# Patient Record
Sex: Female | Born: 1960 | Race: Black or African American | Hispanic: No | Smoking: Former smoker
Health system: Southern US, Community
[De-identification: ages and names within clinical notes are randomized; demographics above are authoritative.]

## PROBLEM LIST (undated history)

## (undated) DIAGNOSIS — Z972 Presence of dental prosthetic device (complete) (partial): Secondary | ICD-10-CM

## (undated) DIAGNOSIS — M1990 Unspecified osteoarthritis, unspecified site: Secondary | ICD-10-CM

## (undated) DIAGNOSIS — E119 Type 2 diabetes mellitus without complications: Secondary | ICD-10-CM

## (undated) DIAGNOSIS — J309 Allergic rhinitis, unspecified: Secondary | ICD-10-CM

## (undated) DIAGNOSIS — Z973 Presence of spectacles and contact lenses: Secondary | ICD-10-CM

## (undated) DIAGNOSIS — K219 Gastro-esophageal reflux disease without esophagitis: Secondary | ICD-10-CM

## (undated) DIAGNOSIS — E669 Obesity, unspecified: Secondary | ICD-10-CM

## (undated) DIAGNOSIS — Z889 Allergy status to unspecified drugs, medicaments and biological substances status: Secondary | ICD-10-CM

## (undated) DIAGNOSIS — M2550 Pain in unspecified joint: Secondary | ICD-10-CM

## (undated) DIAGNOSIS — N951 Menopausal and female climacteric states: Secondary | ICD-10-CM

## (undated) DIAGNOSIS — I10 Essential (primary) hypertension: Secondary | ICD-10-CM

## (undated) HISTORY — DX: Presence of dental prosthetic device (complete) (partial): Z97.2

## (undated) HISTORY — DX: Menopausal and female climacteric states: N95.1

## (undated) HISTORY — DX: Presence of spectacles and contact lenses: Z97.3

## (undated) HISTORY — DX: Essential (primary) hypertension: I10

## (undated) HISTORY — DX: Obesity, unspecified: E66.9

## (undated) HISTORY — DX: Allergy status to unspecified drugs, medicaments and biological substances: Z88.9

## (undated) HISTORY — DX: Allergic rhinitis, unspecified: J30.9

## (undated) HISTORY — DX: Pain in unspecified joint: M25.50

## (undated) HISTORY — DX: Gastro-esophageal reflux disease without esophagitis: K21.9

## (undated) HISTORY — PX: ROTATOR CUFF REPAIR: SHX139

## (undated) HISTORY — PX: OTHER SURGICAL HISTORY: SHX169

## (undated) HISTORY — PX: WISDOM TOOTH EXTRACTION: SHX21

---

## 1988-12-11 HISTORY — PX: OTHER SURGICAL HISTORY: SHX169

## 1989-02-08 HISTORY — PX: TUBAL LIGATION: SHX77

## 1990-09-10 HISTORY — PX: ROTATOR CUFF REPAIR: SHX139

## 1998-05-19 ENCOUNTER — Emergency Department (HOSPITAL_COMMUNITY): Admission: EM | Admit: 1998-05-19 | Discharge: 1998-05-19 | Payer: Self-pay | Admitting: Emergency Medicine

## 1998-07-30 ENCOUNTER — Encounter: Admission: RE | Admit: 1998-07-30 | Discharge: 1998-10-28 | Payer: Self-pay | Admitting: Anesthesiology

## 1998-09-03 ENCOUNTER — Ambulatory Visit (HOSPITAL_COMMUNITY): Admission: RE | Admit: 1998-09-03 | Discharge: 1998-09-03 | Payer: Self-pay | Admitting: Specialist

## 1999-02-28 ENCOUNTER — Encounter: Payer: Self-pay | Admitting: Neurosurgery

## 1999-02-28 ENCOUNTER — Ambulatory Visit (HOSPITAL_COMMUNITY): Admission: RE | Admit: 1999-02-28 | Discharge: 1999-02-28 | Payer: Self-pay | Admitting: Neurosurgery

## 1999-03-17 ENCOUNTER — Encounter: Admission: RE | Admit: 1999-03-17 | Discharge: 1999-06-15 | Payer: Self-pay | Admitting: Neurosurgery

## 2000-11-24 ENCOUNTER — Emergency Department (HOSPITAL_COMMUNITY): Admission: EM | Admit: 2000-11-24 | Discharge: 2000-11-24 | Payer: Self-pay | Admitting: Emergency Medicine

## 2001-03-12 ENCOUNTER — Encounter: Payer: Self-pay | Admitting: *Deleted

## 2001-03-12 ENCOUNTER — Encounter: Admission: RE | Admit: 2001-03-12 | Discharge: 2001-03-12 | Payer: Self-pay | Admitting: *Deleted

## 2002-04-21 ENCOUNTER — Other Ambulatory Visit: Admission: RE | Admit: 2002-04-21 | Discharge: 2002-04-21 | Payer: Self-pay | Admitting: Family Medicine

## 2002-08-08 ENCOUNTER — Other Ambulatory Visit: Admission: RE | Admit: 2002-08-08 | Discharge: 2002-08-08 | Payer: Self-pay | Admitting: *Deleted

## 2003-06-19 ENCOUNTER — Encounter: Admission: RE | Admit: 2003-06-19 | Discharge: 2003-06-19 | Payer: Self-pay | Admitting: Family Medicine

## 2003-06-19 ENCOUNTER — Encounter: Payer: Self-pay | Admitting: Family Medicine

## 2003-06-23 ENCOUNTER — Encounter: Admission: RE | Admit: 2003-06-23 | Discharge: 2003-09-21 | Payer: Self-pay | Admitting: Family Medicine

## 2003-08-06 ENCOUNTER — Encounter (INDEPENDENT_AMBULATORY_CARE_PROVIDER_SITE_OTHER): Payer: Self-pay | Admitting: *Deleted

## 2003-08-06 ENCOUNTER — Ambulatory Visit (HOSPITAL_BASED_OUTPATIENT_CLINIC_OR_DEPARTMENT_OTHER): Admission: RE | Admit: 2003-08-06 | Discharge: 2003-08-06 | Payer: Self-pay | Admitting: General Surgery

## 2003-10-22 ENCOUNTER — Other Ambulatory Visit: Admission: RE | Admit: 2003-10-22 | Discharge: 2003-10-22 | Payer: Self-pay | Admitting: *Deleted

## 2004-04-14 ENCOUNTER — Inpatient Hospital Stay (HOSPITAL_COMMUNITY): Admission: RE | Admit: 2004-04-14 | Discharge: 2004-04-15 | Payer: Self-pay | Admitting: Specialist

## 2004-05-06 ENCOUNTER — Ambulatory Visit (HOSPITAL_COMMUNITY): Admission: RE | Admit: 2004-05-06 | Discharge: 2004-05-06 | Payer: Self-pay | Admitting: Gastroenterology

## 2006-04-11 ENCOUNTER — Encounter: Payer: Self-pay | Admitting: Specialist

## 2006-12-11 HISTORY — PX: COLONOSCOPY: SHX174

## 2008-01-03 ENCOUNTER — Ambulatory Visit: Payer: Self-pay | Admitting: Family Medicine

## 2008-02-14 ENCOUNTER — Ambulatory Visit: Payer: Self-pay | Admitting: *Deleted

## 2008-03-27 ENCOUNTER — Encounter (INDEPENDENT_AMBULATORY_CARE_PROVIDER_SITE_OTHER): Payer: Self-pay | Admitting: Nurse Practitioner

## 2008-03-27 ENCOUNTER — Ambulatory Visit: Payer: Self-pay | Admitting: Internal Medicine

## 2008-03-27 LAB — CONVERTED CEMR LAB
AST: 18 units/L (ref 0–37)
Alkaline Phosphatase: 53 units/L (ref 39–117)
BUN: 9 mg/dL (ref 6–23)
Basophils Absolute: 0 10*3/uL (ref 0.0–0.1)
Basophils Relative: 0 % (ref 0–1)
Creatinine, Ser: 0.66 mg/dL (ref 0.40–1.20)
Eosinophils Absolute: 0.2 10*3/uL (ref 0.0–0.7)
Hemoglobin: 11.2 g/dL — ABNORMAL LOW (ref 12.0–15.0)
MCHC: 31.2 g/dL (ref 30.0–36.0)
MCV: 77 fL — ABNORMAL LOW (ref 78.0–100.0)
Monocytes Absolute: 0.4 10*3/uL (ref 0.1–1.0)
Monocytes Relative: 6 % (ref 3–12)
Neutrophils Relative %: 54 % (ref 43–77)
RBC: 4.66 M/uL (ref 3.87–5.11)
RDW: 20.9 % — ABNORMAL HIGH (ref 11.5–15.5)
Total Bilirubin: 0.4 mg/dL (ref 0.3–1.2)

## 2008-04-03 ENCOUNTER — Ambulatory Visit (HOSPITAL_COMMUNITY): Admission: RE | Admit: 2008-04-03 | Discharge: 2008-04-03 | Payer: Self-pay | Admitting: Family Medicine

## 2008-06-08 ENCOUNTER — Encounter (INDEPENDENT_AMBULATORY_CARE_PROVIDER_SITE_OTHER): Payer: Self-pay | Admitting: Nurse Practitioner

## 2008-06-08 ENCOUNTER — Ambulatory Visit: Payer: Self-pay | Admitting: Internal Medicine

## 2008-06-08 LAB — CONVERTED CEMR LAB
HDL: 79 mg/dL (ref >39)
LDL Cholesterol: 95 mg/dL (ref 0–99)
Total CHOL/HDL Ratio: 2.4
Triglycerides: 61 mg/dL (ref <150)
VLDL: 12 mg/dL (ref 0–40)

## 2008-08-21 ENCOUNTER — Ambulatory Visit: Payer: Self-pay | Admitting: Internal Medicine

## 2008-08-21 ENCOUNTER — Encounter (INDEPENDENT_AMBULATORY_CARE_PROVIDER_SITE_OTHER): Payer: Self-pay | Admitting: Family Medicine

## 2008-08-21 LAB — CONVERTED CEMR LAB
Basophils Absolute: 0 10*3/uL (ref 0.0–0.1)
Basophils Relative: 0 % (ref 0–1)
Hemoglobin: 11.8 g/dL — ABNORMAL LOW (ref 12.0–15.0)
MCHC: 30.3 g/dL (ref 30.0–36.0)
Monocytes Absolute: 0.5 10*3/uL (ref 0.1–1.0)
Neutro Abs: 4.1 10*3/uL (ref 1.7–7.7)
Neutrophils Relative %: 58 % (ref 43–77)
Platelets: 296 10*3/uL (ref 150–400)
RDW: 18.5 % — ABNORMAL HIGH (ref 11.5–15.5)
TSH: 1.016 microintl units/mL (ref 0.350–4.50)

## 2008-10-30 ENCOUNTER — Ambulatory Visit: Payer: Self-pay | Admitting: Family Medicine

## 2009-02-19 ENCOUNTER — Ambulatory Visit: Payer: Self-pay | Admitting: Family Medicine

## 2009-04-23 ENCOUNTER — Ambulatory Visit (HOSPITAL_COMMUNITY): Admission: RE | Admit: 2009-04-23 | Discharge: 2009-04-23 | Payer: Self-pay | Admitting: Internal Medicine

## 2009-07-09 ENCOUNTER — Ambulatory Visit: Payer: Self-pay | Admitting: Internal Medicine

## 2009-08-06 ENCOUNTER — Ambulatory Visit: Payer: Self-pay | Admitting: Family Medicine

## 2009-08-06 LAB — CONVERTED CEMR LAB
BUN: 9 mg/dL (ref 6–23)
Basophils Absolute: 0 10*3/uL (ref 0.0–0.1)
Creatinine, Ser: 0.68 mg/dL (ref 0.40–1.20)
Eosinophils Absolute: 0.2 10*3/uL (ref 0.0–0.7)
Eosinophils Relative: 4 % (ref 0–5)
HCT: 33.1 % — ABNORMAL LOW (ref 36.0–46.0)
Lymphocytes Relative: 32 % (ref 12–46)
Platelets: 298 10*3/uL (ref 150–400)
Potassium: 4 meq/L (ref 3.5–5.3)
RDW: 22 % — ABNORMAL HIGH (ref 11.5–15.5)
Vit D, 25-Hydroxy: 13 ng/mL — ABNORMAL LOW (ref 30–89)

## 2009-08-10 ENCOUNTER — Ambulatory Visit (HOSPITAL_COMMUNITY): Admission: RE | Admit: 2009-08-10 | Discharge: 2009-08-10 | Payer: Self-pay | Admitting: Family Medicine

## 2009-11-11 ENCOUNTER — Other Ambulatory Visit: Admission: RE | Admit: 2009-11-11 | Discharge: 2009-11-11 | Payer: Self-pay | Admitting: Obstetrics and Gynecology

## 2009-11-11 ENCOUNTER — Ambulatory Visit: Payer: Self-pay | Admitting: Obstetrics and Gynecology

## 2009-11-11 LAB — CONVERTED CEMR LAB
RBC: 4.69 M/uL (ref 3.87–5.11)
WBC: 7 10*3/uL (ref 4.0–10.5)

## 2009-12-02 ENCOUNTER — Ambulatory Visit: Payer: Self-pay | Admitting: Obstetrics and Gynecology

## 2009-12-02 LAB — CONVERTED CEMR LAB
Trich, Wet Prep: NONE SEEN
WBC, Wet Prep HPF POC: NONE SEEN
Yeast Wet Prep HPF POC: NONE SEEN

## 2010-01-07 ENCOUNTER — Ambulatory Visit: Payer: Self-pay | Admitting: Family Medicine

## 2010-01-21 ENCOUNTER — Other Ambulatory Visit: Payer: Self-pay | Admitting: Obstetrics and Gynecology

## 2010-01-21 ENCOUNTER — Ambulatory Visit: Payer: Self-pay | Admitting: Obstetrics and Gynecology

## 2010-03-16 ENCOUNTER — Ambulatory Visit: Payer: Self-pay | Admitting: Obstetrics and Gynecology

## 2010-06-30 ENCOUNTER — Ambulatory Visit: Payer: Self-pay | Admitting: Family Medicine

## 2010-07-08 ENCOUNTER — Ambulatory Visit (HOSPITAL_COMMUNITY): Admission: RE | Admit: 2010-07-08 | Discharge: 2010-07-08 | Payer: Self-pay | Admitting: Family Medicine

## 2011-02-27 ENCOUNTER — Other Ambulatory Visit: Payer: Self-pay | Admitting: Family Medicine

## 2011-02-27 ENCOUNTER — Ambulatory Visit (HOSPITAL_COMMUNITY)
Admission: RE | Admit: 2011-02-27 | Discharge: 2011-02-27 | Disposition: A | Discharge status: Home / Self Care | Payer: Self-pay | Source: Ambulatory Visit | Attending: Family Medicine | Admitting: Family Medicine

## 2011-02-27 DIAGNOSIS — R05 Cough: Secondary | ICD-10-CM | POA: Insufficient documentation

## 2011-02-27 DIAGNOSIS — R059 Cough, unspecified: Secondary | ICD-10-CM | POA: Insufficient documentation

## 2011-02-27 DIAGNOSIS — J40 Bronchitis, not specified as acute or chronic: Secondary | ICD-10-CM

## 2011-03-01 LAB — GLUCOSE, CAPILLARY: Glucose-Capillary: 71 mg/dL (ref 70–99)

## 2011-04-28 NOTE — Op Note (Signed)
Lindsay [name], Lindsay [name]                         ACCOUNT NO.:  [id number]   MEDICAL RECORD NO.:  [id number]                   PATIENT TYPE:  AMB   LOCATION:  ENDO                                 FACILITY:  [facility]   PHYSICIAN:  John C. Madilyn Fireman, M.D.                 DATE OF BIRTH:  07-26-1961   DATE OF PROCEDURE:  05/06/2004  DATE OF DISCHARGE:                                 OPERATIVE REPORT   PROCEDURE:  Colonoscopy.   INDICATIONS FOR PROCEDURE:  Family history of colon cancer in a first-degree  relative.   DESCRIPTION OF PROCEDURE:  The patient was placed in the left lateral  decubitus position and placed on the pulse monitor, with continuous low-flow  oxygen delivered by nasal cannula.  She was sedated with 75 mcg IV fentanyl  and 6 mg IV Versed.  The Olympus video colonoscope was inserted into the  rectum and advanced to the cecum, confirmed by transillumination of  McBurney's point and visualization of the ileocecal valve and appendiceal  orifice.  The prep was good.  The cecum, ascending, transverse, descending  and sigmoid colon all appeared normal -- with no masses, polyps, diverticula  or other mucosal abnormalities.  The rectum, likewise, appeared normal.  Retroflex view of the anus revealed no obvious internal hemorrhoids.  The  scope was then withdrawn and the patient returned to the recovery room in  stable condition.  She tolerated the procedure well and there were no  immediate complications.   IMPRESSION:  Normal colonoscopy.   PLAN:  Repeat study in five years, based on her family history.                                               John C. Madilyn Fireman, M.D.    JCH/MEDQ  D:  05/06/2004  T:  05/06/2004  Job:  [id number]

## 2011-04-28 NOTE — Op Note (Signed)
Lindsay [name], Lindsay [name]                         ACCOUNT NO.:  [id number]   MEDICAL RECORD NO.:  [id number]                   PATIENT TYPE:  AMB   LOCATION:  DAY                                  FACILITY:  [facility]   PHYSICIAN:  Jene Every, M.D.                 DATE OF BIRTH:  Feb 23, 1961   DATE OF PROCEDURE:  04/13/2004  DATE OF DISCHARGE:                                 OPERATIVE REPORT   PREOPERATIVE DIAGNOSES:  1. Rotator cuff tear.  2. Acromioclavicular arthrosis.  3. Impingement syndrome, right shoulder.   POSTOPERATIVE DIAGNOSES:  1. Rotator cuff tear.  2. Acromioclavicular arthrosis.  3. Impingement syndrome, right shoulder.   OPERATION/PROCEDURE:  1. Open acromioplasty.  2. Subacromial decompression.  3. Distal clavicle resection.  4. Excision of a degenerated tear of the rotator cuff plus repair.   ANESTHESIA:  General.   SURGEON:  Jene Every, M.D.   ASSISTANT:  Roma Schanz, P.A.-C.   BRIEF HISTORY:  This is a 50 year old with a persistent, refractory shoulder  pain, AC arthrosis, subacromial pain, positive impingement sign.  MRI  indicated partial tear of the rotator cuff.  The patient's body habitus is  such that she had extreme obesity, including arthroscopic evaluation.  Operative intervention is indicated, therefore, for an open distal clavicle  resection, subacromial decompression, examination of the rotator cuff and  repair if necessary.  Risks and benefits discussed including bleeding,  infection, injury to vascular structures, suboptimal range of motion, need  for repeat repair,  etc.   DESCRIPTION OF PROCEDURE:  The patient supine, beach chair position.  After  induction of adequate general anesthesia, the right upper extremity and  shoulder were thoroughly prepped and draped in the usual sterile fashion.  We utilized a 25-gauge needle to palpate the acromion, the anterior lateral  edge as well as the [facility].   We made an incision over the  anterior aspect of the acromion, towards the  clavicular side towards the [facility] side.  This was in [employer].  Subcutaneous tissue was dissected by electrocautery to achieve hemostasis.  We then digitally palpated the anterior aspect of the acromion.  We then  incised the fascia and the capsule over the [facility] joint and then carried it  through and dividing the raphe between the anterior and lateral heads of the  deltoid.  We then skeletonized the distal clavicle, preserving the fascial  attachments and the capsule, placed Homans anteriorly and posteriorly in a  bend at the knee to protect the rotator cuff.  Severe arthrosis of the [facility]  joint was noted.  We utilized an oscillating saw to remove 0.5 cm of the  distal clavicle.  Then undercut the clavicle inferiorly, removing  osteophytic spurs with the 3 mm Kerrison.  The wound was then copiously  irrigated and removed the spur off the acromial side as well.  Rotator cuff  was examined underneath it and  no evidence of a tear or residual  impingement.  We copiously irrigated and preserved all the coracoclavicular  ligaments.  Next, we reached the CA ligament and excised it partially.  There was a spur off the anterior aspect of the acromion and the high-speed  bur and oscillating saw were utilized to remove this to convert it to more  of a type 1 acromion.  Digitally, subacromially lysed multiple adhesions of  the bursa.  There was hypertrophic bursa noted and I excised this.  After  acromioplasty with a high-speed bur, I examined this area at the insertion  of the rotator cuff noted on the MRI.  There was found to be a hypervascular  subtotal tear and degenerative portion of it was felt to be avascular.  I  excised this tear, noted to be 1 cm in length and 3 mm in width.  There was  good bleeding tissue, debrided it and repaired it with #1 Vicryl interrupted  figure-of-eight sutures.  Residual exam of the rotator cuff showed evidence  of no tear.   We copiously irrigated with antibiotic irrigation, put bone wax  into the distal clavicle, repaired the capsule over the [facility] joint with #1  Vicryl interrupted figure-of-eight sutures, the raphe and the deltotrapezial  fascia with #1 Vicryl interrupted figure-of-eight sutures.  An excellent  cuff was repaired to the acromion.  Good range without tension on the  repairs.  Subcutaneous tissue was reapproximated with 2-0 Vicryl simple  suture.  Skin was reapproximated with subcuticular #1 Prolene.  Wound was  reinforced with Steri-Strips.  Sterile dressing applied.  She was placed on  an abduction pillow.  Extubated without difficulty and transported to the  recovery room in satisfactory condition.  The patient tolerated the  procedure well.  No complications.                                               Jene Every, M.D.    Cordelia Pen  D:  04/13/2004  T:  04/13/2004  Job:  [id number]

## 2011-04-28 NOTE — Op Note (Signed)
   Lindsay [name], Lindsay [name]                         ACCOUNT NO.:  [id number]   MEDICAL RECORD NO.:  [id number]                   PATIENT TYPE:  AMB   LOCATION:  DSC                                  FACILITY:  [facility]   PHYSICIAN:  Ollen Gross. Vernell Morgans, M.D.              DATE OF BIRTH:  Feb 04, 1961   DATE OF PROCEDURE:  08/07/2003  DATE OF DISCHARGE:                                 OPERATIVE REPORT   PREOPERATIVE DIAGNOSIS:  Left breast mass.   POSTOPERATIVE DIAGNOSIS:  Left breast mass.   PROCEDURE:  Left breast excisional biopsy.   SURGEON:  Ollen Gross. Carolynne Edouard, M.D.   ANESTHESIA:  General via LMA.   PROCEDURE:  After informed consent was obtained, the patient was brought to  the operating room and placed in the supine position on the operating table.  After adequate induction of general anesthesia, the patient's left breast  was prepped with Betadine and draped in the usual sterile manner.  The  palpable mass was at approximately the 5 o'clock position just below the  edge of the areola.  A small curvilinear incision was made on the inferior  edge of the areola with a 15 blade knife.  This incision was carried down  through the skin and subcutaneous tissue sharply with the electrocautery.  The mass was palpable in the fatty breast tissue.  The mass was grasped with  an Allis clamp and dissection was carried out widely around the mass sharply  with the electrocautery until the mass was completely freed from the breast  and removed.  The mass was sent to pathology for further evaluation.  The  wound was then examined and found to be hemostatic.  The wound was irrigated  with copious amounts of saline.  The skin was closed with a running 4-0  Monocryl subcuticular stitch.  Benzoin, Steri-Strips, and sterile dressings  were applied.  The patient tolerated the procedure well.  At the end of the  case, all needle, sponge, and instrument counts were correct.  The patient  was awakened and taken to  the recovery room in stable condition.                                               Ollen Gross. Vernell Morgans, M.D.    PST/MEDQ  D:  08/07/2003  T:  08/07/2003  Job:  [id number]

## 2011-12-12 DIAGNOSIS — I10 Essential (primary) hypertension: Secondary | ICD-10-CM

## 2011-12-12 DIAGNOSIS — I209 Angina pectoris, unspecified: Secondary | ICD-10-CM

## 2011-12-12 HISTORY — DX: Angina pectoris, unspecified: I20.9

## 2011-12-12 HISTORY — DX: Essential (primary) hypertension: I10

## 2012-03-27 ENCOUNTER — Encounter: Payer: Self-pay | Admitting: Pulmonary Disease

## 2012-03-28 ENCOUNTER — Ambulatory Visit (INDEPENDENT_AMBULATORY_CARE_PROVIDER_SITE_OTHER): Payer: Self-pay | Admitting: Internal Medicine

## 2012-03-28 ENCOUNTER — Other Ambulatory Visit: Payer: Self-pay

## 2012-03-28 ENCOUNTER — Other Ambulatory Visit: Payer: Self-pay | Admitting: Internal Medicine

## 2012-03-28 ENCOUNTER — Encounter: Payer: Self-pay | Admitting: Internal Medicine

## 2012-03-28 VITALS — BP 126/88 | HR 72 | Ht 67.0 in | Wt 249.8 lb

## 2012-03-28 DIAGNOSIS — J302 Other seasonal allergic rhinitis: Secondary | ICD-10-CM

## 2012-03-28 DIAGNOSIS — IMO0001 Reserved for inherently not codable concepts without codable children: Secondary | ICD-10-CM

## 2012-03-28 DIAGNOSIS — L509 Urticaria, unspecified: Secondary | ICD-10-CM

## 2012-03-28 DIAGNOSIS — L500 Allergic urticaria: Secondary | ICD-10-CM

## 2012-03-28 DIAGNOSIS — J309 Allergic rhinitis, unspecified: Secondary | ICD-10-CM

## 2012-03-28 NOTE — Progress Notes (Signed)
03/28/12- 51 yoF referred by Dr Venetia Night at [facility] for concern of skin rash/hives. She is menopausal and attributes skin changes to hormone change. Skin is increasingly itchy. Rash on her back comes and goes. 2 months ago she began having hives for the first time, on thighs and upper legs. Skin burns across shoulders and neck. Denies change in soaps or other environmental exposures. Frequent Benadryl helps. Original onset was daily for one week and then intermittent since then in 1 week at that time. She is currently clear. Now on Claritin for seasonal allergic rhinitis. In childhood had a rash episode with no details known. She gives a history of seasonal allergic rhinitis. Honeybee and yellow jacket stings cause face to swell. No problems from asthma or from food intolerance. Latex exposure causes swelling of her tongue on contact.  Prior to Admission medications   Medication Sig Start Date End Date Taking? Authorizing Provider  albuterol (VENTOLIN HFA) 108 (90 BASE) MCG/ACT inhaler Inhale 2 puffs into the lungs every 6 (six) hours as needed.   Yes Historical Provider, MD  cholecalciferol (VITAMIN D) 1000 UNITS tablet Take 1,000 Units by mouth daily.   Yes Historical Provider, MD  cloNIDine (CATAPRES) 0.1 MG tablet Take 0.1 mg by mouth daily.   Yes Historical Provider, MD  ferrous sulfate 325 (65 FE) MG tablet Take 325 mg by mouth every other day.   Yes Historical Provider, MD  hydrOXYzine (ATARAX/VISTARIL) 25 MG tablet Take 25 mg by mouth 3 (three) times daily as needed.   Yes Historical Provider, MD  loratadine (CLARITIN) 10 MG tablet Take 10 mg by mouth daily.   Yes Historical Provider, MD  Multiple Vitamin (MULTIVITAMIN) tablet Take 1 tablet by mouth daily.   Yes Historical Provider, MD  vitamin E 400 UNIT capsule Take 400 Units by mouth daily.   Yes Historical Provider, MD   Past Medical History  Diagnosis Date  . Allergic rhinitis, cause unspecified   . Symptomatic menopausal or female  climacteric states   . Essential hypertension, benign    Past Surgical History  Procedure Date  . Tubal ligation 02-1989  . Rotator cuff repair     right shoulder   History   Social History  . Marital Status: Single    Spouse Name: N/A    Number of Children: 2  . Years of Education: N/A   Occupational History  . Network engineer.    Social History Main Topics  . Smoking status: Former Smoker -- 0.3 packs/day for 20 years    Types: Cigarettes    Quit date: 12/11/2010  . Smokeless tobacco: Not on file  . Alcohol Use: Yes     socially  . Drug Use: No  . Sexually Active: Not on file   Other Topics Concern  . Not on file   Social History Narrative  . No narrative on file   Family History  Problem Relation Age of Onset  . Asthma Maternal Aunt   . Heart disease Father     MI  . Colon cancer Mother    ROS-see HPI Constitutional:   No-   weight loss, night sweats, fevers, chills, fatigue, lassitude. HEENT:   No-  headaches, difficulty swallowing, +tooth/dental problems, sore throat,       No-  sneezing, +itching, ear ache, +nasal congestion, post nasal drip,  CV:  No-   chest pain, orthopnea, PND, swelling in lower extremities, anasarca, dizziness, palpitations Resp: No-   shortness of breath with  exertion or at rest.              No-   productive cough,  No non-productive cough,  No- coughing up of blood.              No-   change in color of mucus.  No- wheezing.   Skin: +  rash or lesions. GI:  No-   heartburn, indigestion, abdominal pain, nausea, vomiting, diarrhea,                 change in bowel habits, loss of appetite GU: No-   dysuria, change in color of urine, no urgency or frequency.  No- flank pain. MS:  No-   joint pain or swelling.  No- decreased range of motion.  No- back pain. Neuro-     nothing unusual Psych:  No- change in mood or affect. No depression or anxiety.  No memory loss.  OBJ- Physical Exam General- Alert, Oriented,  Affect-appropriate, Distress- none acute, overweight, pleasant Skin- rash-none, lesions- none, excoriation- none, dry scratching at lumbar spine Lymphadenopathy- none Head- atraumatic            Eyes- Gross vision intact, PERRLA, conjunctivae and secretions clear            Ears- Hearing, canals-normal            Nose- Clear, no-Septal dev, mucus, polyps, erosion, perforation             Throat- Mallampati II , mucosa clear , drainage- none, tonsils- atrophic, dentures Neck- flexible , trachea midline, no stridor , thyroid nl, carotid no bruit Chest - symmetrical excursion , unlabored           Heart/CV- RRR , no murmur , no gallop  , no rub, nl s1 s2                           - JVD- none , edema- none, stasis changes- none, varices- none           Lung- clear to P&A, wheeze- none, cough- none , dullness-none, rub- none           Chest wall-  Abd-  Br/ Gen/ Rectal- Not done, not indicated Extrem- cyanosis- none, clubbing, none, atrophy- none, strength- nl Neuro- grossly intact to observation

## 2012-03-28 NOTE — Patient Instructions (Signed)
Order- lab Allergy and Food Allergy IgE profile  Dx urticaria  Good skin moisturizers- Cetaphil, Eucerin, AlphaKeri  Suggest daily Claritin/ loratadine. You could try instead using Allegra/ fexofenadine  Ok to add Benadryl or Hydroxyzine if needed for breakthrough  It may help to add a different class of  otc antihistamine using Tagamet, Zantac or Pepcid if needed as maintenance, every-day controllers.

## 2012-03-29 LAB — ALLERGY FULL AND FOOD SPECIFIC PROFILE
Allergen,Goose feathers, e70: <0.1 kU/L (ref <0.35)
Aspergillus fumigatus, IgG: <0.1 kU/L (ref <0.35)
Bermuda Grass: 2.7 kU/L — ABNORMAL HIGH (ref <0.35)
Candida Albicans: 1.73 kU/L — ABNORMAL HIGH (ref <0.35)
G005 Rye, Perennial: 12.3 kU/L — ABNORMAL HIGH (ref <0.35)
Goldenrod: <0.1 kU/L (ref <0.35)
Helminthosporium halodes: <0.1 kU/L (ref <0.35)
IgE (Immunoglobulin E), Serum: 554.7 IU/mL — ABNORMAL HIGH (ref 0.0–180.0)
Oak: <0.1 kU/L (ref <0.35)
Orange: <0.1 kU/L (ref <0.35)
Peanut IgE: <0.1 kU/L (ref <0.35)
Plantain: 0.14 kU/L (ref <0.35)
Soybean IgE: 0.18 kU/L (ref <0.35)
Stemphylium Botryosum: <0.1 kU/L (ref <0.35)
Timothy Grass: 7.33 kU/L — ABNORMAL HIGH (ref <0.35)
Tomato IgE: <0.1 kU/L (ref <0.35)
Tuna IgE: <0.1 kU/L (ref <0.35)

## 2012-04-02 ENCOUNTER — Encounter: Payer: Self-pay | Admitting: Internal Medicine

## 2012-04-02 DIAGNOSIS — IMO0001 Reserved for inherently not codable concepts without codable children: Secondary | ICD-10-CM | POA: Insufficient documentation

## 2012-04-02 DIAGNOSIS — J302 Other seasonal allergic rhinitis: Secondary | ICD-10-CM | POA: Insufficient documentation

## 2012-04-02 NOTE — Assessment & Plan Note (Signed)
Antihistamines should be sufficient, as already directed at her to carry.

## 2012-04-02 NOTE — Progress Notes (Signed)
Quick Note:  LMTCB ______ 

## 2012-04-02 NOTE — Assessment & Plan Note (Signed)
Plan broad allergy profile blood testing for IgE factors. Try an OTC antihistamine like Claritin plus Pepcid. May add Benadryl if necessary. Ok to continue Cetaphil or Eucerin.

## 2012-04-04 ENCOUNTER — Other Ambulatory Visit (HOSPITAL_COMMUNITY): Payer: Self-pay | Admitting: Family Medicine

## 2012-04-04 ENCOUNTER — Other Ambulatory Visit: Payer: Self-pay

## 2012-04-04 DIAGNOSIS — Z1231 Encounter for screening mammogram for malignant neoplasm of breast: Secondary | ICD-10-CM

## 2012-04-04 NOTE — Progress Notes (Signed)
Quick Note:  LMTCB 04-04-2012 ______

## 2012-04-05 ENCOUNTER — Telehealth: Payer: Self-pay | Admitting: Internal Medicine

## 2012-04-05 NOTE — Progress Notes (Signed)
Quick Note:  Spoke with pt. She is aware of lab results per Dr. Maple Hudson and verbalized understanding of this. ______

## 2012-04-05 NOTE — Telephone Encounter (Signed)
Katie calling to inform pt :   Notes Recorded by Waymon Budge, MD on 03/29/2012 at 7:20 PM Allergy profile- elevated allergy antibody levels, especially for grass and weed pollens.  Called, spoke with pt.  I informed her of above results per Dr. Maple Hudson.  She verbalized understanding of this.

## 2012-04-26 ENCOUNTER — Ambulatory Visit (HOSPITAL_COMMUNITY)
Admission: RE | Admit: 2012-04-26 | Discharge: 2012-04-26 | Disposition: A | Discharge status: Home / Self Care | Payer: Self-pay | Source: Ambulatory Visit | Attending: Family Medicine | Admitting: Family Medicine

## 2012-04-26 DIAGNOSIS — Z1231 Encounter for screening mammogram for malignant neoplasm of breast: Secondary | ICD-10-CM | POA: Insufficient documentation

## 2012-05-10 ENCOUNTER — Ambulatory Visit (INDEPENDENT_AMBULATORY_CARE_PROVIDER_SITE_OTHER): Payer: Self-pay | Admitting: Internal Medicine

## 2012-05-10 ENCOUNTER — Encounter: Payer: Self-pay | Admitting: Internal Medicine

## 2012-05-10 VITALS — BP 110/50 | HR 91 | Ht 67.0 in | Wt 253.8 lb

## 2012-05-10 DIAGNOSIS — J45909 Unspecified asthma, uncomplicated: Secondary | ICD-10-CM

## 2012-05-10 DIAGNOSIS — IMO0001 Reserved for inherently not codable concepts without codable children: Secondary | ICD-10-CM

## 2012-05-10 DIAGNOSIS — J309 Allergic rhinitis, unspecified: Secondary | ICD-10-CM

## 2012-05-10 DIAGNOSIS — J302 Other seasonal allergic rhinitis: Secondary | ICD-10-CM

## 2012-05-10 DIAGNOSIS — L500 Allergic urticaria: Secondary | ICD-10-CM

## 2012-05-10 DIAGNOSIS — J4520 Mild intermittent asthma, uncomplicated: Secondary | ICD-10-CM

## 2012-05-10 NOTE — Patient Instructions (Signed)
For the urticaria/ hives- continue the triple antihistamine approach for now with claritin/ tagamet and atarax. You can gradually try dropping off these at your own best judgment over time.  Ok to use your albuterol rescue inhaler for the mild asthma if needed  Please call as needed.   We will be watching to see how you do as the grass season passes, since that is what you seemed most allergic to on our tests so far.

## 2012-05-10 NOTE — Progress Notes (Addendum)
03/28/12- 51 yoF referred by Dr Venetia Night at [facility] for concern of skin rash/hives. She is menopausal and attributes skin changes to hormone change. Skin is increasingly itchy. Rash on her back comes and goes. 2 months ago she began having hives for the first time, on thighs and upper legs. Skin burns across shoulders and neck. Denies change in soaps or other environmental exposures. Frequent Benadryl helps. Original onset was daily for one week and then intermittent since then in 1 week at that time. She is currently clear. Now on Claritin for seasonal allergic rhinitis. In childhood had a rash episode with no details known. She gives a history of seasonal allergic rhinitis. Honeybee and yellow jacket stings cause face to swell. No problems from asthma or from food intolerance. Latex exposure causes swelling of her tongue on contact.  05/10/12- 51 yoF referred by Dr Venetia Night at [facility] for concern of skin rash/hives Feels like unable to get enough air with muggy air outside;-using rescue inhaler more than usual at this time; states no troubles with allergies at this time. Pollen rhinitis is getting better now in late spring. Using albuterol HFA one or 2 times per week. She added Tagamet and continues Atarax and loratadine. Hives significantly improved. Allergy Profile-05/10/2012- total IgE 554.7 with specific elevations grass and weed pollens, borderline for wheat.  ROS-see HPI Constitutional:   No-   weight loss, night sweats, fevers, chills, fatigue, lassitude. HEENT:   No-  headaches, difficulty swallowing, +tooth/dental problems, sore throat,       No-  sneezing, +itching, ear ache, +nasal congestion, post nasal drip,  CV:  No-   chest pain, orthopnea, PND, swelling in lower extremities, anasarca, dizziness, palpitations Resp: + shortness of breath with exertion or at rest.              No-   productive cough,  No non-productive cough,  No- coughing up of blood.              No-   change in  color of mucus.  No- wheezing.   Skin: +  rash or lesions. GI:  No-   heartburn, indigestion, abdominal pain, nausea, vomiting, diarrhea,                 change in bowel habits, loss of appetite GU: No-   dysuria, change in color of urine, no urgency or frequency.  No- flank pain. MS:  No-   joint pain or swelling.  No- decreased range of motion.  No- back pain. Neuro-     nothing unusual Psych:  No- change in mood or affect. No depression or anxiety.  No memory loss.  OBJ- Physical Exam General- Alert, Oriented, Affect-appropriate, Distress- none acute, overweight, pleasant Skin- rash-none, lesions- none, excoriation- none, dry scratching at lumbar spine Lymphadenopathy- none Head- atraumatic            Eyes- Gross vision intact, PERRLA, conjunctivae and secretions clear            Ears- Hearing, canals-normal            Nose- Clear, no-Septal dev, mucus, polyps, erosion, perforation             Throat- Mallampati II , mucosa clear , drainage- none, tonsils- atrophic, dentures Neck- flexible , trachea midline, no stridor , thyroid nl, carotid no bruit Chest - symmetrical excursion , unlabored           Heart/CV- RRR , no murmur , no gallop  ,  no rub, nl s1 s2                           - JVD- none , edema- none, stasis changes- none, varices- none           Lung- clear to P&A, wheeze- none, cough- none , dullness-none, rub- none           Chest wall-  Abd-  Br/ Gen/ Rectal- Not done, not indicated Extrem- cyanosis- none, clubbing, none, atrophy- none, strength- nl Neuro- grossly intact to observation

## 2012-05-15 DIAGNOSIS — J4520 Mild intermittent asthma, uncomplicated: Secondary | ICD-10-CM | POA: Insufficient documentation

## 2012-05-15 NOTE — Assessment & Plan Note (Signed)
Specific trigger is not identified, but hives are much better controlled on triple antihistamine therapy which can be continued. Usually urticaria will eventually go away.

## 2012-05-15 NOTE — Assessment & Plan Note (Signed)
On broad spectrum antihistamine coverage intended to control her urticaria, she has little problem now with rhinitis.

## 2012-05-15 NOTE — Assessment & Plan Note (Signed)
Rescue bronchodilator inhaler relieves chest tightness and dyspnea pattern she Associates and humid weather, implying that some of it may be bronchospasm from a mild intermittent asthma.

## 2012-09-13 ENCOUNTER — Ambulatory Visit (INDEPENDENT_AMBULATORY_CARE_PROVIDER_SITE_OTHER): Payer: Self-pay | Admitting: Internal Medicine

## 2012-09-13 ENCOUNTER — Encounter: Payer: Self-pay | Admitting: Internal Medicine

## 2012-09-13 VITALS — BP 130/88 | HR 65 | Ht 67.0 in | Wt 262.2 lb

## 2012-09-13 DIAGNOSIS — L500 Allergic urticaria: Secondary | ICD-10-CM

## 2012-09-13 DIAGNOSIS — J45909 Unspecified asthma, uncomplicated: Secondary | ICD-10-CM

## 2012-09-13 DIAGNOSIS — J309 Allergic rhinitis, unspecified: Secondary | ICD-10-CM

## 2012-09-13 DIAGNOSIS — IMO0001 Reserved for inherently not codable concepts without codable children: Secondary | ICD-10-CM

## 2012-09-13 DIAGNOSIS — J4520 Mild intermittent asthma, uncomplicated: Secondary | ICD-10-CM

## 2012-09-13 DIAGNOSIS — J302 Other seasonal allergic rhinitis: Secondary | ICD-10-CM

## 2012-09-13 MED ORDER — ALBUTEROL SULFATE HFA 108 (90 BASE) MCG/ACT IN AERS: 2.0000 | INHALATION_SPRAY | Freq: Four times a day (QID) | RESPIRATORY_TRACT | Status: DC | PRN

## 2012-09-13 NOTE — Assessment & Plan Note (Signed)
Controlled or resolved. For watch-full waiting now.

## 2012-09-13 NOTE — Assessment & Plan Note (Addendum)
We are helping with medication assistance forms for her rescue inhaler. Educated rescue vs maintenance. Plan- start Advair - at least a sample to get through this period.

## 2012-09-13 NOTE — Patient Instructions (Addendum)
Script for albuterol HFA rescue inhaler      Sample Advair 100 maintenance inhaler     1 puff, then rinse mouth well, twice every day.   Flu vax

## 2012-09-13 NOTE — Progress Notes (Signed)
03/28/12- 51 yoF referred by Dr Venetia Night at [facility] for concern of skin rash/hives. She is menopausal and attributes skin changes to hormone change. Skin is increasingly itchy. Rash on her back comes and goes. 2 months ago she began having hives for the first time, on thighs and upper legs. Skin burns across shoulders and neck. Denies change in soaps or other environmental exposures. Frequent Benadryl helps. Original onset was daily for one week and then intermittent since then in 1 week at that time. She is currently clear. Now on Claritin for seasonal allergic rhinitis. In childhood had a rash episode with no details known. She gives a history of seasonal allergic rhinitis. Honeybee and yellow jacket stings cause face to swell. No problems from asthma or from food intolerance. Latex exposure causes swelling of her tongue on contact.  05/10/12- 51 yoF referred by Dr Venetia Night at [facility] for concern of skin rash/hives Feels like unable to get enough air with muggy air outside;-using rescue inhaler more than usual at this time; states no troubles with allergies at this time. Pollen rhinitis is getting better now in late spring. Using albuterol HFA one or 2 times per week. She added Tagamet and continues Atarax and loratadine. Hives significantly improved.  09/13/12- 47 yoF former smoker followed for urticaria, allergic rhinitis, asthma No troubles with allergies; has noticed more SOB and wheezing. Hives resolved. 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 Denies infection.   Allergy Profile-05/10/2012- total IgE 554.7 with specific elevations grass and weed pollens, borderline for wheat.  ROS-see HPI Constitutional:   No-   weight loss, night sweats, fevers, chills, fatigue, lassitude. HEENT:   No-  headaches, difficulty swallowing, tooth/dental problems, sore throat,       No-  sneezing, itching, ear  ache, nasal congestion, post nasal drip,  CV:  No-   chest pain, orthopnea, PND, swelling in lower extremities, anasarca, dizziness, palpitations Resp: No- shortness of breath with exertion or at rest.              No-   productive cough,  No non-productive cough,  No- coughing up of blood.              No-   change in color of mucus.  + wheezing.   Skin: No-ash or lesions. GI:  No-   heartburn, indigestion, abdominal pain, nausea, vomiting,  GU:  MS:  No-   joint pain or swelling.   Neuro-     nothing unusual Psych:  No- change in mood or affect. No depression or anxiety.  No memory loss.  OBJ- Physical Exam General- Alert, Oriented, Affect-appropriate, Distress- none acute, overweight, pleasant Skin- rash-none, lesions- none, excoriation- none,  Lymphadenopathy- none Head- atraumatic            Eyes- Gross vision intact, PERRLA, conjunctivae and secretions clear            Ears- Hearing, canals-normal            Nose- Clear, no-Septal dev, mucus, polyps, erosion, perforation             Throat- Mallampati II , mucosa clear , drainage- none, tonsils- atrophic, dentures Neck- flexible , trachea midline, no stridor , thyroid nl, carotid no bruit Chest - symmetrical excursion , unlabored           Heart/CV- RRR , no murmur , no gallop  ,  no rub, nl s1 s2                           - JVD- none , edema- none, stasis changes- none, varices- none           Lung- clear to P&A, wheeze- none, cough- none , dullness-none, rub- none           Chest wall-  Abd-  Br/ Gen/ Rectal- Not done, not indicated Extrem- cyanosis- none, clubbing, none, atrophy- none, strength- nl Neuro- grossly intact to observation

## 2012-09-13 NOTE — Assessment & Plan Note (Signed)
Doing better. Some eye problems related to dry eyes.

## 2012-10-29 ENCOUNTER — Telehealth: Payer: Self-pay | Admitting: Internal Medicine

## 2012-10-29 MED ORDER — DOXYCYCLINE HYCLATE 100 MG PO TABS: ORAL_TABLET | ORAL | Status: DC

## 2012-10-29 NOTE — Telephone Encounter (Signed)
Per CY-okay to give Doxycycline 100 mg #8 take 2 today then 1 daily no refills.  

## 2012-10-29 NOTE — Telephone Encounter (Signed)
Rx sent.  Pt is aware and will call if her symptoms do not improve or worsen.

## 2012-10-29 NOTE — Telephone Encounter (Signed)
I spoke with pt and she c/o sinus pressure, sinus HA, cough w/ occasional yellow phlem, PND, severe nasal congestion (making it hard for her to breathe) x 3 days. No f/c/s/n/v/ ear pain/sore throat. She has been taking sudafed D w/o relief. Please advise Dr. Maple Hudson thanks LAST ov 09/13/12 Pending OV 12/13/12 [facility] [address]  Allergies  Allergen Reactions  . Latex   . Other     ALL MYCINS  . Penicillins   . Sulfa Antibiotics

## 2012-12-13 ENCOUNTER — Ambulatory Visit: Payer: Self-pay | Admitting: Internal Medicine

## 2014-01-26 ENCOUNTER — Other Ambulatory Visit: Payer: Self-pay | Admitting: Medical

## 2014-01-26 ENCOUNTER — Ambulatory Visit (INDEPENDENT_AMBULATORY_CARE_PROVIDER_SITE_OTHER): Payer: No Typology Code available for payment source | Admitting: Medical

## 2014-01-26 ENCOUNTER — Encounter: Payer: Self-pay | Admitting: Medical

## 2014-01-26 VITALS — BP 110/80 | HR 72 | Temp 98.1°F | Resp 14 | Ht 66.0 in | Wt 268.0 lb

## 2014-01-26 DIAGNOSIS — E669 Obesity, unspecified: Secondary | ICD-10-CM

## 2014-01-26 DIAGNOSIS — Z0000 Encounter for general adult medical examination without abnormal findings: Secondary | ICD-10-CM

## 2014-01-26 DIAGNOSIS — I10 Essential (primary) hypertension: Secondary | ICD-10-CM

## 2014-01-26 DIAGNOSIS — Z1231 Encounter for screening mammogram for malignant neoplasm of breast: Secondary | ICD-10-CM

## 2014-01-26 DIAGNOSIS — Z1211 Encounter for screening for malignant neoplasm of colon: Secondary | ICD-10-CM

## 2014-01-26 LAB — POCT URINALYSIS DIPSTICK
Bilirubin, UA: NEGATIVE
GLUCOSE UA: NEGATIVE
KETONES UA: NEGATIVE
Nitrite, UA: NEGATIVE
SPEC GRAV UA: 1.015
Urobilinogen, UA: NEGATIVE
pH, UA: 6

## 2014-01-26 LAB — HEMOGLOBIN A1C
Hgb A1c MFr Bld: 6.7 % — ABNORMAL HIGH (ref <5.7)
Mean Plasma Glucose: 146 mg/dL — ABNORMAL HIGH (ref <117)

## 2014-01-26 LAB — LIPID PANEL
CHOL/HDL RATIO: 2.6 ratio
CHOLESTEROL: 192 mg/dL (ref 0–200)
HDL: 74 mg/dL (ref >39)
LDL Cholesterol: 107 mg/dL — ABNORMAL HIGH (ref 0–99)
TRIGLYCERIDES: 56 mg/dL (ref <150)
VLDL: 11 mg/dL (ref 0–40)

## 2014-01-26 LAB — TSH: TSH: 0.802 u[IU]/mL (ref 0.350–4.500)

## 2014-01-26 LAB — GLUCOSE, RANDOM: Glucose, Bld: 105 mg/dL — ABNORMAL HIGH (ref 70–99)

## 2014-01-26 NOTE — Patient Instructions (Addendum)
Thank you for giving me the opportunity to serve you today.    Your diagnosis today includes: Encounter Diagnoses  Name Primary?  . Routine general medical examination at a health care facility Yes  . Obesity, unspecified   . Essential hypertension, benign   . Special screening for malignant neoplasms, colon     Specific recommendations today include:  We will refer you to nutritionist  We will refer you to colonoscopy screening  Work on eating healthy diet, exercising most days of the week  We will call with lab results and plan  Follow up: pending labs   I have included other useful information below for your review.   Preventative Care for Adults - Female      MAINTAIN REGULAR HEALTH EXAMS:  A routine yearly physical is a good way to check in with your primary care provider about your health and preventive screening. It is also an opportunity to share updates about your health and any concerns you have, and receive a thorough all-over exam.   Most health insurance companies pay for at least some preventative services.  Check with your health plan for specific coverages.  WHAT PREVENTATIVE SERVICES DO WOMEN NEED?  Adult women should have their weight and blood pressure checked regularly.   Women age 54 and older should have their cholesterol levels checked regularly.  Women should be screened for cervical cancer with a Pap smear and pelvic exam beginning at either age 54, or 3 years after they become sexually activity.    Breast cancer screening generally begins at age 54 with a mammogram and breast exam by your primary care provider.    Beginning at age 850 and continuing to age 54, women should be screened for colorectal cancer.  Certain people may need continued testing until age 54.  Updating vaccinations is part of preventative care.  Vaccinations help protect against diseases such as the flu.  Osteoporosis is a disease in which the bones lose minerals and  strength as we age. Women ages 6965 and over should discuss this with their caregivers, as should women after menopause who have other risk factors.  Lab tests are generally done as part of preventative care to screen for anemia and blood disorders, to screen for problems with the kidneys and liver, to screen for bladder problems, to check blood sugar, and to check your cholesterol level.  Preventative services generally include counseling about diet, exercise, avoiding tobacco, drugs, excessive alcohol consumption, and sexually transmitted infections.    GENERAL RECOMMENDATIONS FOR GOOD HEALTH:  Healthy diet:  Eat a variety of foods, including fruit, vegetables, animal or vegetable protein, such as meat, fish, chicken, and eggs, or beans, lentils, tofu, and grains, such as rice.  Drink plenty of water daily.  Decrease saturated fat in the diet, avoid lots of red meat, processed foods, sweets, fast foods, and fried foods.  Exercise:  Aerobic exercise helps maintain good heart health. At least 30-40 minutes of moderate-intensity exercise is recommended. For example, a brisk walk that increases your heart rate and breathing. This should be done on most days of the week.   Find a type of exercise or a variety of exercises that you enjoy so that it becomes a part of your daily life.  Examples are running, walking, swimming, water aerobics, and biking.  For motivation and support, explore group exercise such as aerobic class, spin class, Zumba, Yoga,or  martial arts, etc.    Set exercise goals for yourself, such as  a certain weight goal, walk or run in a race such as a 5k walk/run.  Speak to your primary care provider about exercise goals.  Disease prevention:  If you smoke or chew tobacco, find out from your caregiver how to quit. It can literally save your life, no matter how long you have been a tobacco user. If you do not use tobacco, never begin.   Maintain a healthy diet and normal weight.  Increased weight leads to problems with blood pressure and diabetes.   The Body Mass Index or BMI is a way of measuring how much of your body is fat. Having a BMI above 27 increases the risk of heart disease, diabetes, hypertension, stroke and other problems related to obesity. Your caregiver can help determine your BMI and based on it develop an exercise and dietary program to help you achieve or maintain this important measurement at a healthful level.  High blood pressure causes heart and blood vessel problems.  Persistent high blood pressure should be treated with medicine if weight loss and exercise do not work.   Fat and cholesterol leaves deposits in your arteries that can block them. This causes heart disease and vessel disease elsewhere in your body.  If your cholesterol is found to be high, or if you have heart disease or certain other medical conditions, then you may need to have your cholesterol monitored frequently and be treated with medication.   Ask if you should have a cardiac stress test if your history suggests this. A stress test is a test done on a treadmill that looks for heart disease. This test can find disease prior to there being a problem.  Menopause can be associated with physical symptoms and risks. Hormone replacement therapy is available to decrease these. You should talk to your caregiver about whether starting or continuing to take hormones is right for you.   Osteoporosis is a disease in which the bones lose minerals and strength as we age. This can result in serious bone fractures. Risk of osteoporosis can be identified using a bone density scan. Women ages 51 and over should discuss this with their caregivers, as should women after menopause who have other risk factors. Ask your caregiver whether you should be taking a calcium supplement and Vitamin D, to reduce the rate of osteoporosis.   Avoid drinking alcohol in excess (more than two drinks per day).  Avoid use of  street drugs. Do not share needles with anyone. Ask for professional help if you need assistance or instructions on stopping the use of alcohol, cigarettes, and/or drugs.  Brush your teeth twice a day with fluoride toothpaste, and floss once a day. Good oral hygiene prevents tooth decay and gum disease. The problems can be painful, unattractive, and can cause other health problems. Visit your dentist for a routine oral and dental check up and preventive care every 6-12 months.   Look at your skin regularly.  Use a mirror to look at your back. Notify your caregivers of changes in moles, especially if there are changes in shapes, colors, a size larger than a pencil eraser, an irregular border, or development of new moles.  Safety:  Use seatbelts 100% of the time, whether driving or as a passenger.  Use safety devices such as hearing protection if you work in environments with loud noise or significant background noise.  Use safety glasses when doing any work that could send debris in to the eyes.  Use a helmet if you  ride a bike or motorcycle.  Use appropriate safety gear for contact sports.  Talk to your caregiver about gun safety.  Use sunscreen with a SPF (or skin protection factor) of 15 or greater.  Lighter skinned people are at a greater risk of skin cancer. Don't forget to also wear sunglasses in order to protect your eyes from too much damaging sunlight. Damaging sunlight can accelerate cataract formation.   Practice safe sex. Use condoms. Condoms are used for birth control and to help reduce the spread of sexually transmitted infections (or STIs).  Some of the STIs are gonorrhea (the clap), chlamydia, syphilis, trichomonas, herpes, HPV (human papilloma virus) and HIV (human immunodeficiency virus) which causes AIDS. The herpes, HIV and HPV are viral illnesses that have no cure. These can result in disability, cancer and death.   Keep carbon monoxide and smoke detectors in your home functioning  at all times. Change the batteries every 6 months or use a model that plugs into the wall.   Vaccinations:  Stay up to date with your tetanus shots and other required immunizations. You should have a booster for tetanus every 10 years. Be sure to get your flu shot every year, since 5%-20% of the [place] population comes down with the flu. The flu vaccine changes each year, so being vaccinated once is not enough. Get your shot in the fall, before the flu season peaks.   Other vaccines to consider:  Human Papilloma Virus or HPV causes cancer of the cervix, and other infections that can be transmitted from person to person. There is a vaccine for HPV, and females should get immunized between the ages of 70 and 45. It requires a series of 3 shots.   Pneumococcal vaccine to protect against certain types of pneumonia.  This is normally recommended for adults age 78 or older.  However, adults younger than 54 years old with certain underlying conditions such as diabetes, heart or lung disease should also receive the vaccine.  Shingles vaccine to protect against Varicella Zoster if you are older than age 23, or younger than 54 years old with certain underlying illness.  Hepatitis A vaccine to protect against a form of infection of the liver by a virus acquired from food.  Hepatitis B vaccine to protect against a form of infection of the liver by a virus acquired from blood or body fluids, particularly if you work in health care.  If you plan to travel internationally, check with your local health department for specific vaccination recommendations.  Cancer Screening:  Breast cancer screening is essential to preventive care for women. All women age 55 and older should perform a breast self-exam every month. At age 39 and older, women should have their caregiver complete a breast exam each year. Women at ages 45 and older should have a mammogram (x-ray film) of the breasts. Your caregiver can discuss how  often you need mammograms.    Cervical cancer screening includes taking a Pap smear (sample of cells examined under a microscope) from the cervix (end of the uterus). It also includes testing for HPV (Human Papilloma Virus, which can cause cervical cancer). Screening and a pelvic exam should begin at age 34, or 3 years after a woman becomes sexually active. Screening should occur every year, with a Pap smear but no HPV testing, up to age 2. After age 85, you should have a Pap smear every 3 years with HPV testing, if no HPV was found previously.   Most routine  colon cancer screening begins at the age of 33. On a yearly basis, doctors may provide special easy to use take-home tests to check for hidden blood in the stool. Sigmoidoscopy or colonoscopy can detect the earliest forms of colon cancer and is life saving. These tests use a small camera at the end of a tube to directly examine the colon. Speak to your caregiver about this at age 34, when routine screening begins (and is repeated every 5 years unless early forms of pre-cancerous polyps or small growths are found).

## 2014-01-26 NOTE — Assessment & Plan Note (Signed)
Compliant with Clonidine 0.1mg  QHS for hot flashes and HTN.  Was on maxzide at one point .

## 2014-01-26 NOTE — Progress Notes (Signed)
Subjective:   HPI  Lindsay [name] is a 53 y.o. female who presents for a complete physical.  New patient today.  Was seeing [facility] in the past.   Preventative care: Last ophthalmology visit:yes- Dr. Ceasar MonsPerzea Last dental visit: not right now Last colonoscopy:n/a [facility] Last gynecological exam:09/25/13 Last EKG:no Last labs:?  Prior vaccinations: TD or Tdap:12/2012 Influenza:09/2013 Pneumococcal:n/a Shingles/Zostavax:n/a Other: Dr. Purnell Shoemakercousin's  Advanced directive:n/a Health care power of attorney:n/a Living will:n/a  Concerns: Has a mole on her right hip that looks different that other moles.  Wants this looked at.  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  Last month had really back pain with this.   Essential hypertension, benign Compliant with Clonidine 0.1mg  QHS for hot flashes and HTN.  Was on maxzide at one point .   Obesity, unspecified weight gain the last 2 years.   Trying to eat healthy, exercising, but can't seem to lose weight.  Wanting to avoid diabetes.  Having trouble losing weight.  The last 2 years in particular has gained weight.  Has perimenopausal symptoms.  Sees gynecology.  Reviewed their medical, surgical, family, social, medication, and allergy history and updated chart as appropriate.  Past Medical History  Diagnosis Date  . Allergic rhinitis, cause unspecified     seasonal  . Symptomatic menopausal or female climacteric states   . Wears glasses   . Wears partial dentures     upper  . GERD (gastroesophageal reflux disease)   . Obesity   . Essential hypertension, benign 2013  . Multiple drug allergies   . Joint pain     Past Surgical History  Procedure Laterality Date  . Tubal ligation  02-1989  . Rotator cuff repair      right shoulder  . Tonsils removed    . Wisdom tooth extraction    . Left knee surgery      arthroscopy  . Colonoscopy  2008    [facility]    History    Social History  . Marital Status: Single    Spouse Name: N/A    Number of Children: 2  . Years of Education: N/A   Occupational History  . Network engineerBilling and Insurance Coord.    Social History Main Topics  . Smoking status: Former Smoker -- 0.30 packs/day for 20 years    Types: Cigarettes    Quit date: 12/11/2010  . Smokeless tobacco: Not on file  . Alcohol Use: Yes     Comment: socially  . Drug Use: No  . Sexual Activity: Not on file   Other Topics Concern  . 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    Family History  Problem Relation Age of Onset  . Asthma Maternal Aunt   . Heart disease Father [id number]    MI  . Hypertension Father   . Cancer Mother [id number]    colon  . Diabetes Neg Hx   . Stroke Neg Hx     Current outpatient prescriptions:cetaphil (CETAPHIL) lotion, Apply 1 application topically as needed., Disp: , Rfl: ;  cholecalciferol (VITAMIN D) 1000 UNITS tablet, Take 1,000 Units by mouth daily., Disp: , Rfl: ;  cloNIDine (CATAPRES) 0.1 MG tablet, Take 0.1 mg by mouth daily., Disp: , Rfl: ;  esomeprazole (NEXIUM) 20 MG capsule, Take 20 mg by mouth daily at 12  noon., Disp: , Rfl:  loratadine (CLARITIN) 10 MG tablet, Take 10 mg by mouth daily., Disp: , Rfl: ;  Multiple Vitamin (MULTIVITAMIN) tablet, Take 1 tablet by mouth daily., Disp: , Rfl: ;  Nutritional Supplements (ESTROVEN WEIGHT MANAGEMENT PO), Take by mouth., Disp: , Rfl: ;  Omega-3 Fatty Acids (FISH OIL) 1000 MG CAPS, Take by mouth., Disp: , Rfl: ;  Probiotic Product (TRUNATURE DIGESTIVE PROBIOTIC PO), Take by mouth., Disp: , Rfl:  Raspberry Ketones 100 MG CAPS, Take by mouth., Disp: , Rfl: ;  vitamin E 400 UNIT capsule, Take 400 Units by mouth daily., Disp: , Rfl: ;  albuterol (VENTOLIN HFA) 108 (90 BASE) MCG/ACT inhaler, Inhale 2 puffs into the lungs every 6 (six) hours as needed for wheezing or  shortness of breath., Disp: 1 Inhaler, Rfl: prn  Allergies  Allergen Reactions  . Penicillins Anaphylaxis  . Latex   . Sulfa Antibiotics Hives  . Ceftin [Cefuroxime Axetil] Rash    Tolerates Keflex  . Doxycycline Rash  . Other Rash    ALL MYCINS    Review of Systems Constitutional: -fever, -chills, -sweats, +unexpected weight change, -decreased appetite, -fatigue Allergy: -sneezing, -itching, -congestion Dermatology: +changing moles, --rash, -lumps ENT: -runny nose, -ear pain, -sore throat, -hoarseness, -sinus pain, -teeth pain, - ringing in ears, -hearing loss, -nosebleeds Cardiology: -chest pain, -palpitations, -swelling, -difficulty breathing when lying flat, -waking up short of breath Respiratory: -cough, -shortness of breath, -difficulty breathing with exercise or exertion, -wheezing, -coughing up blood Gastroenterology: -abdominal pain, -nausea, -vomiting, -diarrhea, -constipation, -blood in stool, -changes in bowel movement, -difficulty swallowing or eating Hematology: -bleeding, -bruising  Musculoskeletal: +joint aches, -muscle aches, -joint swelling, +back pain, -neck pain, -cramping, -changes in gait Ophthalmology: denies vision changes, eye redness, itching, discharge Urology: -burning with urination, -difficulty urinating, -blood in urine, -urinary frequency, -urgency, -incontinence Neurology: -headache, -weakness, -tingling, -numbness, -memory loss, -falls, -dizziness Psychology: -depressed mood, -agitation, -sleep problems     Objective:   Physical Exam  BP 110/80  Pulse 72  Temp(Src) 98.1 F (36.7 C) (Oral)  Resp 14  Ht 5\' 6"  (1.676 m)  Wt 268 lb (121.564 kg)  BMI 43.28 kg/m2  General appearance: alert, no distress, WD/WN, obese AA female Skin: right shoulder posteriorly with large patch of brown coloration, birth mark unchanged per patient, right lower buttock with 1 cm raised rough lesion somewhat verruca, brown, uniform color, lower back with 2cm patch of  slight raised brown rough skin, scattered brown benign appearing papules of bilat cheeks, no other worrisome lesions HEENT: normocephalic, conjunctiva/corneas normal, sclerae anicteric, PERRLA, EOMi, nares patent, no discharge or erythema, pharynx normal Oral cavity: MMM, tongue normal, teeth normal Neck: supple, no lymphadenopathy, no thyromegaly, no masses, normal ROM Chest: non tender, normal shape and expansion Heart: RRR, normal S1, S2, no murmurs Lungs: CTA bilaterally, no wheezes, rhonchi, or rales Abdomen: +bs, soft, non tender, non distended, no masses, no hepatomegaly, no splenomegaly, no bruits Back: non tender, normal ROM, no scoliosis Musculoskeletal: upper extremities non tender, no obvious deformity, normal ROM throughout, lower extremities non tender, no obvious deformity, normal ROM throughout Extremities: no edema, no cyanosis, no clubbing Pulses: 2+ symmetric, upper and lower extremities, normal cap refill Neurological: alert, oriented x 3, CN2-12 intact, strength normal upper extremities and lower extremities, sensation normal throughout, DTRs 2+ throughout, no cerebellar signs, gait normal Psychiatric: normal affect, behavior normal, pleasant  Breast/gyn/rectal - deferred to gynecology  Assessment and Plan :    Encounter Diagnoses  Name Primary?  Marland Kitchen  Routine general medical examination at a health care facility Yes  . Obesity, unspecified   . Essential hypertension, benign   . Special screening for malignant neoplasms, colon     Physical exam - discussed healthy lifestyle, diet, exercise, preventative care, vaccinations, and addressed their concerns.  Handout given.  Reviewed labs from 10/14 showing glucose 100, HgbA1C 6.6%, normal CBC, normal CMET Obesity - referral for nutritionist, discussed diet, exericse, possible medications.  F/u pending labs HTN - c/t same medication Referral for repeat colonscopy, screening.  Follow-up pending labs

## 2014-01-26 NOTE — Assessment & Plan Note (Signed)
weight gain the last 2 years.   Trying to eat healthy, exercising, but can't seem to lose weight.

## 2014-01-29 ENCOUNTER — Encounter: Payer: Self-pay | Admitting: Medical

## 2014-02-02 NOTE — Progress Notes (Signed)
LMOM FOR PATIENT TO CB. cls

## 2014-02-09 ENCOUNTER — Ambulatory Visit (HOSPITAL_COMMUNITY): Admission: RE | Admit: 2014-02-09 | Payer: Self-pay | Source: Ambulatory Visit

## 2014-02-10 ENCOUNTER — Encounter: Payer: Self-pay | Admitting: Family Medicine

## 2014-03-12 ENCOUNTER — Ambulatory Visit: Payer: Self-pay | Admitting: Dietician

## 2014-07-10 ENCOUNTER — Ambulatory Visit: Payer: No Typology Code available for payment source | Admitting: Family Medicine

## 2014-12-21 ENCOUNTER — Ambulatory Visit
Admission: RE | Admit: 2014-12-21 | Discharge: 2014-12-21 | Disposition: A | Discharge status: Home / Self Care | Payer: BLUE CROSS/BLUE SHIELD | Source: Ambulatory Visit | Attending: Family Medicine | Admitting: Family Medicine

## 2014-12-21 ENCOUNTER — Other Ambulatory Visit: Payer: Self-pay | Admitting: Family Medicine

## 2014-12-21 DIAGNOSIS — R0602 Shortness of breath: Secondary | ICD-10-CM

## 2018-11-09 ENCOUNTER — Encounter (HOSPITAL_COMMUNITY): Payer: Self-pay | Admitting: Emergency Medicine

## 2018-11-09 ENCOUNTER — Other Ambulatory Visit: Payer: Self-pay

## 2018-11-09 ENCOUNTER — Inpatient Hospital Stay (HOSPITAL_COMMUNITY)
Admission: EM | Admit: 2018-11-09 | Discharge: 2018-11-14 | DRG: 281 | Disposition: A | Discharge status: Home / Self Care | Payer: BLUE CROSS/BLUE SHIELD | Attending: Internal Medicine | Admitting: Internal Medicine

## 2018-11-09 ENCOUNTER — Emergency Department (HOSPITAL_COMMUNITY): Payer: BLUE CROSS/BLUE SHIELD

## 2018-11-09 DIAGNOSIS — G4440 Drug-induced headache, not elsewhere classified, not intractable: Secondary | ICD-10-CM | POA: Diagnosis not present

## 2018-11-09 DIAGNOSIS — R0789 Other chest pain: Secondary | ICD-10-CM | POA: Diagnosis present

## 2018-11-09 DIAGNOSIS — Z882 Allergy status to sulfonamides status: Secondary | ICD-10-CM

## 2018-11-09 DIAGNOSIS — K449 Diaphragmatic hernia without obstruction or gangrene: Secondary | ICD-10-CM | POA: Diagnosis present

## 2018-11-09 DIAGNOSIS — I491 Atrial premature depolarization: Secondary | ICD-10-CM | POA: Diagnosis not present

## 2018-11-09 DIAGNOSIS — I119 Hypertensive heart disease without heart failure: Secondary | ICD-10-CM | POA: Diagnosis present

## 2018-11-09 DIAGNOSIS — Z87891 Personal history of nicotine dependence: Secondary | ICD-10-CM | POA: Diagnosis not present

## 2018-11-09 DIAGNOSIS — I10 Essential (primary) hypertension: Secondary | ICD-10-CM | POA: Diagnosis present

## 2018-11-09 DIAGNOSIS — I214 Non-ST elevation (NSTEMI) myocardial infarction: Principal | ICD-10-CM

## 2018-11-09 DIAGNOSIS — Z8249 Family history of ischemic heart disease and other diseases of the circulatory system: Secondary | ICD-10-CM

## 2018-11-09 DIAGNOSIS — T148XXA Other injury of unspecified body region, initial encounter: Secondary | ICD-10-CM

## 2018-11-09 DIAGNOSIS — I201 Angina pectoris with documented spasm: Secondary | ICD-10-CM

## 2018-11-09 DIAGNOSIS — K219 Gastro-esophageal reflux disease without esophagitis: Secondary | ICD-10-CM | POA: Diagnosis present

## 2018-11-09 DIAGNOSIS — Z9851 Tubal ligation status: Secondary | ICD-10-CM

## 2018-11-09 DIAGNOSIS — Y92230 Patient room in hospital as the place of occurrence of the external cause: Secondary | ICD-10-CM | POA: Diagnosis not present

## 2018-11-09 DIAGNOSIS — Z79899 Other long term (current) drug therapy: Secondary | ICD-10-CM | POA: Diagnosis not present

## 2018-11-09 DIAGNOSIS — I97630 Postprocedural hematoma of a circulatory system organ or structure following a cardiac catheterization: Secondary | ICD-10-CM | POA: Diagnosis not present

## 2018-11-09 DIAGNOSIS — Z881 Allergy status to other antibiotic agents status: Secondary | ICD-10-CM

## 2018-11-09 DIAGNOSIS — Z825 Family history of asthma and other chronic lower respiratory diseases: Secondary | ICD-10-CM

## 2018-11-09 DIAGNOSIS — T463X5A Adverse effect of coronary vasodilators, initial encounter: Secondary | ICD-10-CM | POA: Diagnosis not present

## 2018-11-09 DIAGNOSIS — Z800 Family history of malignant neoplasm of digestive organs: Secondary | ICD-10-CM | POA: Diagnosis not present

## 2018-11-09 DIAGNOSIS — I471 Supraventricular tachycardia: Secondary | ICD-10-CM | POA: Diagnosis not present

## 2018-11-09 DIAGNOSIS — J302 Other seasonal allergic rhinitis: Secondary | ICD-10-CM | POA: Diagnosis present

## 2018-11-09 DIAGNOSIS — E119 Type 2 diabetes mellitus without complications: Secondary | ICD-10-CM | POA: Diagnosis present

## 2018-11-09 DIAGNOSIS — Z880 Allergy status to penicillin: Secondary | ICD-10-CM | POA: Diagnosis not present

## 2018-11-09 DIAGNOSIS — R079 Chest pain, unspecified: Secondary | ICD-10-CM | POA: Diagnosis not present

## 2018-11-09 DIAGNOSIS — Z6841 Body Mass Index (BMI) 40.0 and over, adult: Secondary | ICD-10-CM

## 2018-11-09 DIAGNOSIS — I071 Rheumatic tricuspid insufficiency: Secondary | ICD-10-CM | POA: Diagnosis present

## 2018-11-09 DIAGNOSIS — E669 Obesity, unspecified: Secondary | ICD-10-CM | POA: Diagnosis present

## 2018-11-09 DIAGNOSIS — I25111 Atherosclerotic heart disease of native coronary artery with angina pectoris with documented spasm: Secondary | ICD-10-CM | POA: Diagnosis present

## 2018-11-09 DIAGNOSIS — Z91040 Latex allergy status: Secondary | ICD-10-CM

## 2018-11-09 LAB — CBC
HCT: 41.7 % (ref 36.0–46.0)
HEMOGLOBIN: 12.4 g/dL (ref 12.0–15.0)
MCH: 23.4 pg — ABNORMAL LOW (ref 26.0–34.0)
MCHC: 29.7 g/dL — ABNORMAL LOW (ref 30.0–36.0)
MCV: 78.5 fL — ABNORMAL LOW (ref 80.0–100.0)
Platelets: 283 10*3/uL (ref 150–400)
RBC: 5.31 MIL/uL — ABNORMAL HIGH (ref 3.87–5.11)
RDW: 16.1 % — ABNORMAL HIGH (ref 11.5–15.5)
WBC: 5.7 10*3/uL (ref 4.0–10.5)
nRBC: 0 % (ref 0.0–0.2)

## 2018-11-09 LAB — BASIC METABOLIC PANEL
Anion gap: 10 (ref 5–15)
BUN: 13 mg/dL (ref 6–20)
CO2: 29 mmol/L (ref 22–32)
Calcium: 9.4 mg/dL (ref 8.9–10.3)
Chloride: 101 mmol/L (ref 98–111)
Creatinine, Ser: 0.78 mg/dL (ref 0.44–1.00)
GFR calc Af Amer: >60 mL/min (ref >60)
Glucose, Bld: 125 mg/dL — ABNORMAL HIGH (ref 70–99)
Potassium: 3.5 mmol/L (ref 3.5–5.1)
Sodium: 140 mmol/L (ref 135–145)

## 2018-11-09 LAB — TROPONIN I: TROPONIN I: 0.3 ng/mL — AB (ref <0.03)

## 2018-11-09 MED ORDER — ASPIRIN EC 81 MG PO TBEC
81.0000 mg | DELAYED_RELEASE_TABLET | Freq: Every day | ORAL | Status: DC
  Administered 2018-11-10 – 2018-11-14 (×4): 81 mg via ORAL
  Filled 2018-11-09 (×5): qty 1

## 2018-11-09 MED ORDER — SODIUM CHLORIDE 0.9 % IV BOLUS
500.0000 mL | Freq: Once | INTRAVENOUS | Status: AC
  Administered 2018-11-09: 500 mL via INTRAVENOUS

## 2018-11-09 MED ORDER — TRAMADOL HCL 50 MG PO TABS
50.0000 mg | ORAL_TABLET | Freq: Once | ORAL | Status: AC
  Administered 2018-11-09: 50 mg via ORAL
  Filled 2018-11-09: qty 1

## 2018-11-09 MED ORDER — NITROGLYCERIN IN D5W 200-5 MCG/ML-% IV SOLN
0.0000 ug/min | INTRAVENOUS | Status: DC
  Administered 2018-11-09: 5 ug/min via INTRAVENOUS
  Filled 2018-11-09: qty 250

## 2018-11-09 MED ORDER — HEPARIN BOLUS VIA INFUSION
4000.0000 [IU] | Freq: Once | INTRAVENOUS | Status: AC
  Administered 2018-11-09: 4000 [IU] via INTRAVENOUS
  Filled 2018-11-09: qty 4000

## 2018-11-09 MED ORDER — ACETAMINOPHEN 325 MG PO TABS
650.0000 mg | ORAL_TABLET | ORAL | Status: DC | PRN
  Administered 2018-11-09 – 2018-11-13 (×9): 650 mg via ORAL
  Filled 2018-11-09 (×9): qty 2

## 2018-11-09 MED ORDER — ATORVASTATIN CALCIUM 80 MG PO TABS
80.0000 mg | ORAL_TABLET | Freq: Every day | ORAL | Status: DC
  Administered 2018-11-09 – 2018-11-14 (×6): 80 mg via ORAL
  Filled 2018-11-09 (×6): qty 1

## 2018-11-09 MED ORDER — NITROGLYCERIN 0.4 MG SL SUBL
0.4000 mg | SUBLINGUAL_TABLET | SUBLINGUAL | Status: AC | PRN
  Administered 2018-11-09 – 2018-11-10 (×3): 0.4 mg via SUBLINGUAL
  Filled 2018-11-09 (×2): qty 1

## 2018-11-09 MED ORDER — HEPARIN (PORCINE) 25000 UT/250ML-% IV SOLN
1200.0000 [IU]/h | INTRAVENOUS | Status: DC
  Administered 2018-11-09 – 2018-11-11 (×3): 1200 [IU]/h via INTRAVENOUS
  Filled 2018-11-09 (×3): qty 250

## 2018-11-09 MED ORDER — ONDANSETRON HCL 4 MG/2ML IJ SOLN: 4.0000 mg | Freq: Four times a day (QID) | INTRAMUSCULAR | Status: DC | PRN

## 2018-11-09 MED ORDER — ASPIRIN 81 MG PO CHEW
324.0000 mg | CHEWABLE_TABLET | Freq: Once | ORAL | Status: AC
  Administered 2018-11-09: 324 mg via ORAL
  Filled 2018-11-09: qty 4

## 2018-11-09 NOTE — ED Notes (Signed)
Attempted to call report

## 2018-11-09 NOTE — ED Provider Notes (Signed)
[facility] EMERGENCY DEPARTMENT Provider Note   CSN: [id number] Arrival date & time: 11/09/18  1642     History   Chief Complaint Chief Complaint  Patient presents with  . Chest Pain    HPI Lindsay [name] is a 57 y.o. female.  HPI  57 year old female presents with chest pain.  When she first awoke around 8 AM she woke up in a sweat and feeling hot.  She had squeezing to the left side of her chest and her arm felt numb on the left side.  She thought it might be because she was laying on her left side.  This pain eventually resolved but then later around 2 PM at a funeral she developed an aching sensation in the middle of her chest.  This has waxed and waned.  No shortness of breath.  She has been having diaphoresis all day.  No nausea or vomiting.  Chronic pedal edema that is unchanged.  No recent travel.  Patient's pain is about a 6 out of 10 at this time.  Nothing makes it better or worse including deep inspiration or exertion.  Arm does not feel numb currently but does not feel right either.  The chest pain does not radiate. Has not tried anything for the pain.  She denies any history of hypertension, hyperlipidemia, diabetes.  Dad had a heart attack in his 41s.  Past Medical History:  Diagnosis Date  . Allergic rhinitis, cause unspecified    seasonal  . Essential hypertension, benign 2013  . GERD (gastroesophageal reflux disease)   . Joint pain   . Multiple drug allergies   . Obesity   . Symptomatic menopausal or female climacteric states   . Wears glasses   . Wears partial dentures    upper    Patient Active Problem List   Diagnosis Date Noted  . NSTEMI (non-ST elevated myocardial infarction) (HCC) 11/09/2018  . Essential hypertension, benign 01/26/2014  . Obesity, unspecified 01/26/2014  . Allergy, urticaria 04/02/2012    Past Surgical History:  Procedure Laterality Date  . COLONOSCOPY  2008   [facility]  . left knee surgery     arthroscopy  . ROTATOR CUFF REPAIR     right shoulder  . tonsils removed    . TUBAL LIGATION  02-1989  . WISDOM TOOTH EXTRACTION       OB History   None      Home Medications    Prior to Admission medications   Medication Sig Start Date End Date Taking? Authorizing Provider  esomeprazole (NEXIUM) 20 MG capsule Take 20 mg by mouth daily at 12 noon.   Yes [provider]  hydrochlorothiazide (HYDRODIURIL) 25 MG tablet Take 25 mg by mouth daily. 08/30/18  Yes [provider]  Multiple Vitamin (MULTIVITAMIN) tablet Take 1 tablet by mouth daily.   Yes [provider]    Family History Family History  Problem Relation Age of Onset  . Heart disease Father 64       MI  . Hypertension Father   . Cancer Mother 26       colon  . Asthma Maternal Aunt   . Diabetes Neg Hx   . Stroke Neg Hx     Social History Social History   Tobacco Use  . Smoking status: Former Smoker    Packs/day: 0.30    Years: 20.00    Pack years: 6.00    Types: Cigarettes    Last attempt to quit:  12/11/2010    Years since quitting: 7.9  . Smokeless tobacco: Never Used  Substance Use Topics  . Alcohol use: Yes    Comment: socially  . Drug use: No     Allergies   Penicillins; Latex; Sulfa antibiotics; Ceftin [cefuroxime axetil]; Doxycycline; and Other   Review of Systems Review of Systems  Constitutional: Positive for diaphoresis.  Respiratory: Negative for shortness of breath.   Cardiovascular: Positive for chest pain.  Gastrointestinal: Negative for abdominal pain, nausea and vomiting.  Musculoskeletal: Negative for back pain.  All other systems reviewed and are negative.    Physical Exam Updated Vital Signs BP 104/70   Pulse 69   Temp 98.1 F (36.7 C)   Resp 16   Ht 5' 7.5" (1.715 m)   Wt 116.1 kg   SpO2 93%   BMI 39.50 kg/m   Physical Exam  Constitutional: She appears well-developed and well-nourished.  Non-toxic appearance. She does not appear ill.  No distress.  obese  HENT:  Head: Normocephalic and atraumatic.  Right Ear: External ear normal.  Left Ear: External ear normal.  Nose: Nose normal.  Eyes: Right eye exhibits no discharge. Left eye exhibits no discharge.  Cardiovascular: Normal rate, regular rhythm and normal heart sounds.  Pulmonary/Chest: Effort normal and breath sounds normal. She exhibits no tenderness.  Abdominal: Soft. There is no tenderness.  Neurological: She is alert.  Skin: Skin is warm and dry. She is not diaphoretic.  Psychiatric: Her mood appears not anxious.  Nursing note and vitals reviewed.    ED Treatments / Results  Labs (all labs ordered are listed, but only abnormal results are displayed) Labs Reviewed  BASIC METABOLIC PANEL - Abnormal; Notable for the following components:      Result Value   Glucose, Bld 125 (*)    All other components within normal limits  CBC - Abnormal; Notable for the following components:   RBC 5.31 (*)    MCV 78.5 (*)    MCH 23.4 (*)    MCHC 29.7 (*)    RDW 16.1 (*)    All other components within normal limits  TROPONIN I - Abnormal; Notable for the following components:   Troponin I 0.30 (*)    All other components within normal limits  HEPARIN LEVEL (UNFRACTIONATED)  CBC    EKG EKG Interpretation  Date/Time:  Saturday November 09 2018 16:48:18 EST Ventricular Rate:  75 PR Interval:  144 QRS Duration: 80 QT Interval:  382 QTC Calculation: 426 R Axis:   -36 Text Interpretation:  Normal sinus rhythm with sinus arrhythmia Left axis deviation Abnormal ECG No old tracing to compare Confirmed by Marily Memos [phone]) on 11/09/2018 5:01:47 PM   EKG Interpretation  Date/Time:  Saturday November 09 2018 18:17:58 EST Ventricular Rate:  73 PR Interval:  144 QRS Duration: 89 QT Interval:  432 QTC Calculation: 477 R Axis:   -27 Text Interpretation:  Sinus rhythm Ventricular premature complex Borderline left axis deviation Low voltage, precordial leads  Abnormal R-wave progression, early transition Borderline T wave abnormalities T waves flatter compared to earlier in the day Confirmed by Pricilla Loveless [phone]) on 11/09/2018 6:20:43 PM        Radiology Dg Chest 2 View  Result Date: 11/09/2018 CLINICAL DATA:  Central chest pain with shortness of breath since this morning. EXAM: CHEST - 2 VIEW COMPARISON:  12/21/2014 FINDINGS: Lungs are adequately inflated without focal airspace consolidation or effusion. Cardiomediastinal silhouette and remainder of the exam is unchanged. IMPRESSION:  No active cardiopulmonary disease. Electronically Signed   By: Elberta Fortisaniel  Boyle M.D.   On: 11/09/2018 17:41    Procedures .Critical Care Performed by: Pricilla LovelessGoldston, Janaysha Depaulo, MD Authorized by: Pricilla LovelessGoldston, Sharada Albornoz, MD   Critical care provider statement:    Critical care time (minutes):  35   Critical care time was exclusive of:  Separately billable procedures and treating other patients   Critical care was necessary to treat or prevent imminent or life-threatening deterioration of the following conditions:  Cardiac failure   Critical care was time spent personally by me on the following activities:  Development of treatment plan with patient or surrogate, discussions with consultants, evaluation of patient's response to treatment, examination of patient, obtaining history from patient or surrogate, ordering and performing treatments and interventions, ordering and review of laboratory studies, ordering and review of radiographic studies, pulse oximetry, re-evaluation of patient's condition and review of old charts   (including critical care time)  Medications Ordered in ED Medications  nitroGLYCERIN (NITROSTAT) SL tablet 0.4 mg (0.4 mg Sublingual Given 11/09/18 1828)  heparin ADULT infusion 100 units/mL (25000 units/26250mL sodium chloride 0.45%) (1,200 Units/hr Intravenous New Bag/Given 11/09/18 1846)  nitroGLYCERIN 50 mg in dextrose 5 % 250 mL (0.2 mg/mL) infusion (10  mcg/min Intravenous Rate/Dose Change 11/09/18 2006)  aspirin chewable tablet 324 mg (324 mg Oral Given 11/09/18 1807)  heparin bolus via infusion 4,000 Units (4,000 Units Intravenous Bolus from Bag 11/09/18 1848)  sodium chloride 0.9 % bolus 500 mL (0 mLs Intravenous Stopped 11/09/18 2006)     Initial Impression / Assessment and Plan / ED Course  I have reviewed the triage vital signs and the nursing notes.  Pertinent labs & imaging results that were available during my care of the patient were reviewed by me and considered in my medical decision making (see chart for details).     Patient's chest pain is improved with the nitroglycerin.  Her first troponin is positive at 0.30.  Repeat ECG does not show any ST elevation but does show some flattened T waves which is new from earlier in the day.  I am concerned about ACS.  I have started her on heparin and given her low blood pressure after the nitroglycerin I will start on nitro drip instead of further bolusing nitroglycerin.  I have consulted cardiology who will come to admit and treat.  Final Clinical Impressions(s) / ED Diagnoses   Final diagnoses:  NSTEMI (non-ST elevated myocardial infarction) [facility](HCC)    ED Discharge Orders    None       Pricilla LovelessGoldston, Nayanna Seaborn, MD 11/09/18 2056

## 2018-11-09 NOTE — ED Notes (Signed)
Blue, Burna MortimerLight Green, Anthoney Haradaark Green, Lavender collected.   Dark [facility]er [facility].

## 2018-11-09 NOTE — Progress Notes (Signed)
ANTICOAGULATION CONSULT NOTE - Initial Consult  Pharmacy Consult for heparin Indication: chest pain/ACS  Allergies  Allergen Reactions  . Penicillins Anaphylaxis  . Latex   . Sulfa Antibiotics Hives  . Ceftin [Cefuroxime Axetil] Rash    Tolerates Keflex  . Doxycycline Rash  . Other Rash    ALL MYCINS    Patient Measurements: Height: 5' 7.5" (171.5 cm) Weight: 256 lb (116.1 kg) IBW/kg (Calculated) : 62.75 Heparin Dosing Weight: 89.7kg  Vital Signs: Temp: 98.1 F (36.7 C) (11/30 1652) BP: 111/62 (11/30 1815) Pulse Rate: 72 (11/30 1815)  Labs: Recent Labs    11/09/18 1700  HGB 12.4  HCT 41.7  PLT 283  CREATININE 0.78  TROPONINI 0.30*    Estimated Creatinine Clearance: 101.8 mL/min (by C-G formula based on SCr of 0.78 mg/dL).   Medical History: Past Medical History:  Diagnosis Date  . Allergic rhinitis, cause unspecified    seasonal  . Essential hypertension, benign 2013  . GERD (gastroesophageal reflux disease)   . Joint pain   . Multiple drug allergies   . Obesity   . Symptomatic menopausal or female climacteric states   . Wears glasses   . Wears partial dentures    upper    Medications:  Infusions:  . heparin 1,200 Units/hr (11/09/18 1846)  . nitroGLYCERIN    . sodium chloride 500 mL (11/09/18 1843)    Assessment: 57 yof presented to the ED with CP. Troponin elevated and now starting IV heparin. Baseline CBC is WNL and she is not on anticoagulation PTA.   Goal of Therapy:  Heparin level 0.3-0.7 units/ml Monitor platelets by anticoagulation protocol: Yes   Plan:  Heparin bolus 4000 units IV x 1 Heparin gtt 1200 units/hr Check a 6 hr heparin level Daily heparin level and CBC  Emauri Krygier, Drake Leachachel Lynn 11/09/2018,6:38 PM

## 2018-11-09 NOTE — H&P (Signed)
Cardiology Admission History and Physical:   Patient ID: Lindsay [name]; MRN: [id number]; DOB: [birth year]   Admission date: 11/09/2018  Primary Care Provider:  Jac Canavan, PA-C Primary Cardiologist:  None   Chief Complaint:   Chest pain  History of Present Illness:   Lindsay [name] is a 57 y.o. female with a history of hypertension, obesity, allergic rhinitis and former smoking who presents to the hospital with complaints of pressure-like chest pain.  The pain woke her up from sleep at around 8 AM.  She stated that it was squeezing in nature and radiated to the left arm (that also felt like it was asleep).  She felt hot and was diaphoretic.  She denied any nausea or vomiting.  She had difficulty catching her breath but did not feel short of breath overall.  She denied any orthopnea or paroxysmal nocturnal dyspnea.  She has chronic lower extremity edema for which she takes hydrochlorothiazide.  The chest pain waxed and waned until the afternoon when she went to a funeral.  She reports that the pain then got more intense and started radiating to her shoulders and the back.  She got more concerned about her symptoms and eventually presented to the hospital.  In the ED the ECG revealed normal sinus rhythm, rate 73 bpm, left axis deviation, poor R wave progression, and nonspecific T wave flattening.  The troponin was mildly elevated at 0.30.  She received 324 mg of aspirin, sublingual nitroglycerin and IV unfractionated heparin.  Prior to the chest pain episode today the patient has done reasonably well.  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  She is able to walk long distances on a level surface and can climb multiple flights of stairs without any issues.   Past Medical History:  Diagnosis Date  . Allergic rhinitis, cause unspecified    seasonal  . Essential hypertension, benign 2013  . GERD (gastroesophageal reflux disease)   . Joint  pain   . Multiple drug allergies   . Obesity   . Symptomatic menopausal or female climacteric states   . Wears glasses   . Wears partial dentures    upper    Past Surgical History:  Procedure Laterality Date  . COLONOSCOPY  2008   [facility]  . left knee surgery     arthroscopy  . ROTATOR CUFF REPAIR     right shoulder  . tonsils removed    . TUBAL LIGATION  02-1989  . WISDOM TOOTH EXTRACTION       Medications Prior to Admission: Prior to Admission medications   Medication Sig Start Date End Date Taking? Authorizing Provider  esomeprazole (NEXIUM) 20 MG capsule Take 20 mg by mouth daily at 12 noon.   Yes [provider]  hydrochlorothiazide (HYDRODIURIL) 25 MG tablet Take 25 mg by mouth daily. 08/30/18  Yes [provider]  Multiple Vitamin (MULTIVITAMIN) tablet Take 1 tablet by mouth daily.   Yes [provider]     Allergies:    Allergies  Allergen Reactions  . Penicillins Anaphylaxis  . Latex   . Sulfa Antibiotics Hives  . Ceftin [Cefuroxime Axetil] Rash    Tolerates Keflex  . Doxycycline Rash  . Other Rash    ALL MYCINS    Social History:   Social History   Socioeconomic History  . Marital status: Single    Spouse name: Not on file  . Number of children:  2  . Years of education: Not on file  . Highest education level: Not on file  Occupational History  . Occupation: Network engineer.  Social Needs  . Financial resource strain: Not on file  . Food insecurity:    Worry: Not on file    Inability: Not on file  . Transportation needs:    Medical: Not on file    Non-medical: Not on file  Tobacco Use  . Smoking status: Former Smoker    Packs/day: 0.30    Years: 20.00    Pack years: 6.00    Types: Cigarettes    Last attempt to quit: 12/11/2010    Years since quitting: 7.9  . Smokeless tobacco: Never Used  Substance and Sexual Activity  . Alcohol use: Yes    Comment: socially  . Drug use: No  . Sexual  activity: Not on file  Lifestyle  . Physical activity:    Days per week: Not on file    Minutes per session: Not on file  . Stress: Not on file  Relationships  . Social connections:    Talks on phone: Not on file    Gets together: Not on file    Attends religious service: Not on file    Active member of club or organization: Not on file    Attends meetings of clubs or organizations: Not on file    Relationship status: Not on file  . Intimate partner violence:    Fear of current or ex partner: Not on file    Emotionally abused: Not on file    Physically abused: Not on file    Forced sexual activity: Not on file  Other Topics Concern  . 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     Family History:  The patient's family history includes Asthma in her maternal aunt; Cancer (age of onset: 36) in her mother; Heart disease (age of onset: 96) in her father; Hypertension in her father. There is no history of Diabetes or Stroke.     Review of Systems: [y] = yes, [ ]  = no   . General: Weight gain [ ] ; Weight loss [ ] ; Anorexia [ ] ; Fatigue [ ] ; Fever [ ] ; Chills [ ] ; Weakness [ ]   . Cardiac: Chest pain/pressure [place].Etienne ]; Resting SOB [ ] ; Exertional SOB [ ] ; Orthopnea [ ] ; Pedal Edema Cove.Etienne ]; Palpitations [ ] ; Syncope [ ] ; Presyncope [ ] ; Paroxysmal nocturnal dyspnea[ ]   . Pulmonary: Cough [ ] ; Wheezing[ ] ; Hemoptysis[ ] ; Sputum [ ] ; Snoring [ ]   . GI: Vomiting[ ] ; Dysphagia[ ] ; Melena[ ] ; Hematochezia [ ] ; Heartburn[ ] ; Abdominal pain [ ] ; Constipation [ ] ; Diarrhea [ ] ; BRBPR [ ]   . GU: Hematuria[ ] ; Dysuria [ ] ; Nocturia[ ]   . Vascular: Pain in legs with walking [ ] ; Pain in feet with lying flat [ ] ; Non-healing sores [ ] ; Stroke [ ] ; TIA [ ] ; Slurred speech [ ] ;  . Neuro: Headaches[y ]; Vertigo[ ] ; Seizures[ ] ; Paresthesias[ ] ;Blurred vision [ ] ; Diplopia [ ] ; Vision  changes [ ]   . Ortho/Skin: Arthritis [ ] ; Joint pain [ ] ; Muscle pain [ ] ; Joint swelling [ ] ; Back Pain [ ] ; Rash [ ]   . Psych: Depression[ ] ; Anxiety[ ]   . Heme: Bleeding problems [ ] ; Clotting disorders [ ] ; Anemia [ ]   .  Endocrine: Diabetes [ ] ; Thyroid dysfunction[ ]      Physical Exam/Data:   Vitals:   11/09/18 1815 11/09/18 1830 11/09/18 1838 11/09/18 1918  BP: 111/62 (!) 99/52 (!) 115/55 117/66  Pulse: 72 75 73 72  Resp: 19 20  16   Temp:      SpO2: 95% 93% 96% 94%  Weight:      Height:       No intake or output data in the 24 hours ending 11/09/18 1922 Filed Weights   11/09/18 1653  Weight: 116.1 kg   Body mass index is 39.5 kg/m.  General:  Well nourished, well developed, in no acute distress HEENT: normal Lymph: no adenopathy Neck: no JVD Endocrine:  No thryomegaly Vascular: No carotid bruits; FA pulses 2+ bilaterally without bruits  Cardiac:  normal S1, S2; RRR; no murmur  Lungs:  clear to auscultation bilaterally, no wheezing, rhonchi or rales  Abd: soft, nontender, no hepatomegaly  Ext: no edema Musculoskeletal:  No deformities, BUE and BLE strength normal and equal Skin: warm and dry  Neuro:  CNs 2-12 intact, no focal abnormalities noted Psych:  Normal affect    EKG:  The ECG that was done today (11/09/18) was personally reviewed and demonstrates normal sinus rhythm, rate 73 bpm, left axis deviation, poor R wave progression, nonspecific T wave flattening.    Laboratory Data:  Chemistry Recent Labs  Lab 11/09/18 1700  NA 140  K 3.5  CL 101  CO2 29  GLUCOSE 125*  BUN 13  CREATININE 0.78  CALCIUM 9.4  GFRNONAA >60  GFRAA >60  ANIONGAP 10    No results for input(s): PROT, ALBUMIN, AST, ALT, ALKPHOS, BILITOT in the last 168 hours. Hematology Recent Labs  Lab 11/09/18 1700  WBC 5.7  RBC 5.31*  HGB 12.4  HCT 41.7  MCV 78.5*  MCH 23.4*  MCHC 29.7*  RDW 16.1*  PLT 283   Cardiac Enzymes Recent Labs  Lab 11/09/18 1700  TROPONINI  0.30*   No results for input(s): TROPIPOC in the last 168 hours.  BNPNo results for input(s): BNP, PROBNP in the last 168 hours.  DDimer No results for input(s): DDIMER in the last 168 hours.  Radiology/Studies:  Dg Chest 2 View  Result Date: 11/09/2018 CLINICAL DATA:  Central chest pain with shortness of breath since this morning. EXAM: CHEST - 2 VIEW COMPARISON:  12/21/2014 FINDINGS: Lungs are adequately inflated without focal airspace consolidation or effusion. Cardiomediastinal silhouette and remainder of the exam is unchanged. IMPRESSION: No active cardiopulmonary disease. Electronically Signed   By: Elberta Fortisaniel  Boyle M.D.   On: 11/09/2018 17:41    Assessment and Plan:   1. Non-ST elevation myocardial infarction  - Trend cardiac biomarkers and obtain serial ECGs - Start aspirin 81 mg daily - Start high dose statins (such as Atorvastatin 80 mg nightly). Check a lipid panel in the morning. - Continue intravenous unfractionated heparin per pharmacy protocol - Obtain a transthoracic echocardiogram to evaluate LV systolic and diastolic function. -  Recommend cardiac catheterization to define the coronary anatomy.   Severity of Illness: The appropriate patient status for this patient is INPATIENT. Inpatient status is judged to be reasonable and necessary in order to provide the required intensity of service to ensure the patient's safety. The patient's presenting symptoms, physical exam findings, and initial radiographic and laboratory data in the context of their chronic comorbidities is felt to place them at high risk for further clinical deterioration. Furthermore, it is not anticipated that the  patient will be medically stable for discharge from the hospital within 2 midnights of admission. The following factors support the patient status of inpatient.   " The patient's presenting symptoms include chest pain. " The physical exam findings include normal cardiac auscultation. " The initial  radiographic and laboratory data are worrisome because of elevated troponin. " The chronic co-morbidities include obesity and hypertension.   * I certify that at the point of admission it is my clinical judgment that the patient will require inpatient hospital care spanning beyond 2 midnights from the point of admission due to high intensity of service, high risk for further deterioration and high frequency of surveillance required.*    For questions or updates, please contact [facility] Please consult [email or web address] for contact info under Cardiology/STEMI.    Signed, Lonie Peak, MD  11/09/2018 7:22 PM

## 2018-11-09 NOTE — ED Triage Notes (Signed)
The pt has had some chest pain  With sob since this am when she woke up.  Since 1430 her pain has increased with sob and some tingling in her lt arm  No previous history

## 2018-11-10 ENCOUNTER — Encounter (HOSPITAL_COMMUNITY): Payer: Self-pay

## 2018-11-10 ENCOUNTER — Inpatient Hospital Stay (HOSPITAL_COMMUNITY): Payer: BLUE CROSS/BLUE SHIELD

## 2018-11-10 ENCOUNTER — Other Ambulatory Visit: Payer: Self-pay

## 2018-11-10 DIAGNOSIS — R079 Chest pain, unspecified: Secondary | ICD-10-CM

## 2018-11-10 LAB — LIPID PANEL
Cholesterol: 183 mg/dL (ref 0–200)
HDL: 72 mg/dL (ref >40)
LDL Cholesterol: 96 mg/dL (ref 0–99)
Total CHOL/HDL Ratio: 2.5 RATIO
Triglycerides: 74 mg/dL (ref <150)
VLDL: 15 mg/dL (ref 0–40)

## 2018-11-10 LAB — TROPONIN I
TROPONIN I: 0.37 ng/mL — AB (ref <0.03)
Troponin I: 0.29 ng/mL (ref <0.03)
Troponin I: 0.24 ng/mL (ref <0.03)

## 2018-11-10 LAB — BASIC METABOLIC PANEL
Anion gap: 9 (ref 5–15)
BUN: 18 mg/dL (ref 6–20)
CO2: 25 mmol/L (ref 22–32)
Calcium: 8.7 mg/dL — ABNORMAL LOW (ref 8.9–10.3)
Chloride: 104 mmol/L (ref 98–111)
Creatinine, Ser: 0.76 mg/dL (ref 0.44–1.00)
GFR calc Af Amer: >60 mL/min (ref >60)
GFR calc non Af Amer: >60 mL/min (ref >60)
Glucose, Bld: 133 mg/dL — ABNORMAL HIGH (ref 70–99)
Potassium: 3.4 mmol/L — ABNORMAL LOW (ref 3.5–5.1)
Sodium: 138 mmol/L (ref 135–145)

## 2018-11-10 LAB — HIV ANTIBODY (ROUTINE TESTING W REFLEX): HIV Screen 4th Generation wRfx: NONREACTIVE

## 2018-11-10 LAB — CBC
HCT: 39.2 % (ref 36.0–46.0)
Hemoglobin: 11.6 g/dL — ABNORMAL LOW (ref 12.0–15.0)
MCH: 23 pg — ABNORMAL LOW (ref 26.0–34.0)
MCHC: 29.6 g/dL — ABNORMAL LOW (ref 30.0–36.0)
MCV: 77.6 fL — ABNORMAL LOW (ref 80.0–100.0)
Platelets: 263 10*3/uL (ref 150–400)
RBC: 5.05 MIL/uL (ref 3.87–5.11)
RDW: 16 % — ABNORMAL HIGH (ref 11.5–15.5)
WBC: 6.3 10*3/uL (ref 4.0–10.5)
nRBC: 0 % (ref 0.0–0.2)

## 2018-11-10 LAB — HEPARIN LEVEL (UNFRACTIONATED)
Heparin Unfractionated: 0.41 IU/mL (ref 0.30–0.70)
Heparin Unfractionated: 0.34 IU/mL (ref 0.30–0.70)

## 2018-11-10 LAB — PROTIME-INR
INR: 1.02
Prothrombin Time: 13.3 seconds (ref 11.4–15.2)

## 2018-11-10 MED ORDER — ASPIRIN 81 MG PO CHEW
81.0000 mg | CHEWABLE_TABLET | ORAL | Status: AC
  Administered 2018-11-11: 81 mg via ORAL
  Filled 2018-11-10: qty 1

## 2018-11-10 MED ORDER — SODIUM CHLORIDE 0.9 % IV SOLN: 250.0000 mL | INTRAVENOUS | Status: DC | PRN

## 2018-11-10 MED ORDER — SODIUM CHLORIDE 0.9% FLUSH: 3.0000 mL | Freq: Two times a day (BID) | INTRAVENOUS | Status: DC

## 2018-11-10 MED ORDER — SODIUM CHLORIDE 0.9 % WEIGHT BASED INFUSION
1.0000 mL/kg/h | INTRAVENOUS | Status: DC
  Administered 2018-11-11 (×2): 1 mL/kg/h via INTRAVENOUS

## 2018-11-10 MED ORDER — SODIUM CHLORIDE 0.9 % WEIGHT BASED INFUSION
3.0000 mL/kg/h | INTRAVENOUS | Status: DC
  Administered 2018-11-11: 3 mL/kg/h via INTRAVENOUS

## 2018-11-10 MED ORDER — SODIUM CHLORIDE 0.9% FLUSH: 3.0000 mL | INTRAVENOUS | Status: DC | PRN

## 2018-11-10 NOTE — Progress Notes (Signed)
ANTICOAGULATION CONSULT NOTE   Pharmacy Consult for heparin Indication: chest pain/ACS  Allergies  Allergen Reactions  . Penicillins Anaphylaxis  . Latex   . Sulfa Antibiotics Hives  . Ceftin [Cefuroxime Axetil] Rash    Tolerates Keflex  . Doxycycline Rash  . Other Rash    ALL MYCINS    Patient Measurements: Height: 5' 7.5" (171.5 cm) Weight: 274 lb 9.6 oz (124.6 kg) IBW/kg (Calculated) : 62.75 Heparin Dosing Weight: 89.7kg  Vital Signs: Temp: 98.1 F (36.7 C) (11/30 2156) Temp Source: Oral (11/30 2156) BP: 123/71 (11/30 2242) Pulse Rate: 70 (11/30 2242)  Labs: Recent Labs    11/09/18 1700 11/10/18 0040  HGB 12.4  --   HCT 41.7  --   PLT 283  --   HEPARINUNFRC  --  0.41  CREATININE 0.78  --   TROPONINI 0.30*  --     Estimated Creatinine Clearance: 105.9 mL/min (by C-G formula based on SCr of 0.78 mg/dL).   Medical History: Past Medical History:  Diagnosis Date  . Allergic rhinitis, cause unspecified    seasonal  . Essential hypertension, benign 2013  . GERD (gastroesophageal reflux disease)   . Joint pain   . Multiple drug allergies   . Obesity   . Symptomatic menopausal or female climacteric states   . Wears glasses   . Wears partial dentures    upper    Medications:  Infusions:  . heparin 1,200 Units/hr (11/09/18 1846)  . nitroGLYCERIN Stopped (11/09/18 2343)    Assessment: [age] yof presented to the ED with CP. Troponin elevated and now starting IV heparin. Baseline CBC is WNL and she is not on anticoagulation PTA.  Initial heparin level 0.41 units/ml  Goal of Therapy:  Heparin level 0.3-0.7 units/ml Monitor platelets by anticoagulation protocol: Yes   Plan:  Continue heparin gtt 1200 units/hr Check heparin level later this am to confirm Daily heparin level and CBC  Carrye Goller Poteet 11/10/2018,1:35 AM

## 2018-11-10 NOTE — Progress Notes (Signed)
  Echocardiogram 2D Echocardiogram has been performed.  Roosvelt MaserLane, Avary Eichenberger F 11/10/2018, 6:06 PM

## 2018-11-10 NOTE — H&P (View-Only) (Signed)
Progress Note  Patient Name: MAYCEE BLASCO Date of Encounter: 11/10/2018  Primary Cardiologist: [facility] Parents [facility])  Subjective   No further chest pain, had chest pain at church yesterday, chest pain lasted from 2PM to around 4 PM before resolving.   Inpatient Medications    Scheduled Meds: . aspirin EC  81 mg Oral Daily  . atorvastatin  80 mg Oral q1800   Continuous Infusions: . heparin 1,200 Units/hr (11/09/18 1900)  . nitroGLYCERIN Stopped (11/09/18 2343)   PRN Meds: acetaminophen, nitroGLYCERIN, ondansetron (ZOFRAN) IV   Vital Signs    Vitals:   11/09/18 2156 11/09/18 2242 11/10/18 0431 11/10/18 0438  BP: 135/66 123/71 103/64   Pulse: 71 70 64   Resp: 13  12   Temp: 98.1 F (36.7 C)  98.3 F (36.8 C)   TempSrc: Oral  Oral   SpO2: 99%  97%   Weight:    124.9 kg  Height:        Intake/Output Summary (Last 24 hours) at 11/10/2018 0852 Last data filed at 11/10/2018 0649 Gross per 24 hour  Intake 729.67 ml  Output -  Net 729.67 ml   Filed Weights   11/09/18 1653 11/09/18 2150 11/10/18 0438  Weight: 116.1 kg 124.6 kg 124.9 kg    Telemetry    NSR without significant ventricular ectopy - Personally Reviewed  ECG    Sinus bradycardia, no significant ST-T wave changes - Personally Reviewed  Physical Exam   GEN: No acute distress.   Neck: No JVD Cardiac: RRR, no murmurs, rubs, or gallops.  Respiratory: Clear to auscultation bilaterally. GI: Soft, nontender, non-distended  MS: No edema; No deformity. Neuro:  Nonfocal  Psych: Normal affect   Labs    Chemistry Recent Labs  Lab 11/09/18 1700 11/10/18 0418  NA 140 138  K 3.5 3.4*  CL 101 104  CO2 29 25  GLUCOSE 125* 133*  BUN 13 18  CREATININE 0.78 0.76  CALCIUM 9.4 8.7*  GFRNONAA >60 >60  GFRAA >60 >60  ANIONGAP 10 9     Hematology Recent Labs  Lab 11/09/18 1700 11/10/18 0418  WBC 5.7 6.3  RBC 5.31* 5.05  HGB 12.4 11.6*  HCT 41.7 39.2  MCV 78.5* 77.6*  MCH 23.4* 23.0*  MCHC  29.7* 29.6*  RDW 16.1* 16.0*  PLT 283 263    Cardiac Enzymes Recent Labs  Lab 11/09/18 1700  TROPONINI 0.30*   No results for input(s): TROPIPOC in the last 168 hours.   BNPNo results for input(s): BNP, PROBNP in the last 168 hours.   DDimer No results for input(s): DDIMER in the last 168 hours.   Radiology    Dg Chest 2 View  Result Date: 11/09/2018 CLINICAL DATA:  Central chest pain with shortness of breath since this morning. EXAM: CHEST - 2 VIEW COMPARISON:  12/21/2014 FINDINGS: Lungs are adequately inflated without focal airspace consolidation or effusion. Cardiomediastinal silhouette and remainder of the exam is unchanged. IMPRESSION: No active cardiopulmonary disease. Electronically Signed   By: Elberta Fortis M.D.   On: 11/09/2018 17:41    Cardiac Studies   N/A  Patient Profile     57 y.o. female with PMH of HTN, obesity and former smoking presented with chest pain and found to have elevated troponin.  Assessment & Plan    1. NSTEMI: had 2 hours of chest pain yesterday while at [facility]. Resolved by the time she came to ED.   - plan for cath tomorrow given elevated trop.  No obvious EKG changes. No chest pain overnight. Continue IV heparin.   - serial troponin. Obtain Echo  - Risk and benefit of procedure explained to the patient who display clear understanding and agree to proceed.  Discussed with patient possible procedural risk include bleeding, vascular injury, renal injury, arrythmia, MI, stroke and loss of limb or life  2. HTN:   3. DM II: hgb A1C 6.7 in 2015, per patient, it was managed with diet and exercise. Repeat Hemoglobin A1C   For questions or updates, please contact [facility] Please consult [email or web address] for contact info under     Signed, Azalee CourseHao Meng, PA  11/10/2018, 8:52 AM     Attending note:  Patient seen and examined.  I reviewed history and physical by cardiology fellow, also discussed discussed the case with Mr. Eyvonne LeftMeng PA-C.  Ms. Tiburcio PeaHarris  is chest pain-free this morning.  On examination she appears comfortable.  Afebrile, heart rate in the 60s to 70s in sinus rhythm by telemetry which I personally reviewed.  Systolic blood pressure ranging 100-130 range.  Lungs are clear without labored breathing.  Cardiac exam with RRR no gallop.  Lab work shows potassium 3.4, BUN 18, creatinine 0.76, troponin I 0.30, LDL 96, hemoglobin 11.6, platelets 263.  ECG shows nonspecific T wave changes.  Patient will be placed on the schedule for diagnostic cardiac catheterization tomorrow.  Risks and benefits discussed. Continue aspirin, Lipitor, IV nitroglycerin, and IV heparin.  Lab work a.m.  Jonelle SidleSamuel G. Pratyush Ammon, M.D., F.A.C.C.

## 2018-11-10 NOTE — Progress Notes (Addendum)
Progress Note  Patient Name: Lindsay [name] Date of Encounter: 11/10/2018  Primary Cardiologist: [facility] Parents [facility])  Subjective   No further chest pain, had chest pain at church yesterday, chest pain lasted from 2PM to around 4 PM before resolving.   Inpatient Medications    Scheduled Meds: . aspirin EC  81 mg Oral Daily  . atorvastatin  80 mg Oral q1800   Continuous Infusions: . heparin 1,200 Units/hr (11/09/18 1900)  . nitroGLYCERIN Stopped (11/09/18 2343)   PRN Meds: acetaminophen, nitroGLYCERIN, ondansetron (ZOFRAN) IV   Vital Signs    Vitals:   11/09/18 2156 11/09/18 2242 11/10/18 0431 11/10/18 0438  BP: 135/66 123/71 103/64   Pulse: 71 70 64   Resp: 13  12   Temp: 98.1 F (36.7 C)  98.3 F (36.8 C)   TempSrc: Oral  Oral   SpO2: 99%  97%   Weight:    124.9 kg  Height:        Intake/Output Summary (Last 24 hours) at 11/10/2018 0852 Last data filed at 11/10/2018 0649 Gross per 24 hour  Intake 729.67 ml  Output -  Net 729.67 ml   Filed Weights   11/09/18 1653 11/09/18 2150 11/10/18 0438  Weight: 116.1 kg 124.6 kg 124.9 kg    Telemetry    NSR without significant ventricular ectopy - Personally Reviewed  ECG    Sinus bradycardia, no significant ST-T wave changes - Personally Reviewed  Physical Exam   GEN: No acute distress.   Neck: No JVD Cardiac: RRR, no murmurs, rubs, or gallops.  Respiratory: Clear to auscultation bilaterally. GI: Soft, nontender, non-distended  MS: No edema; No deformity. Neuro:  Nonfocal  Psych: Normal affect   Labs    Chemistry Recent Labs  Lab 11/09/18 1700 11/10/18 0418  NA 140 138  K 3.5 3.4*  CL 101 104  CO2 29 25  GLUCOSE 125* 133*  BUN 13 18  CREATININE 0.78 0.76  CALCIUM 9.4 8.7*  GFRNONAA >60 >60  GFRAA >60 >60  ANIONGAP 10 9     Hematology Recent Labs  Lab 11/09/18 1700 11/10/18 0418  WBC 5.7 6.3  RBC 5.31* 5.05  HGB 12.4 11.6*  HCT 41.7 39.2  MCV 78.5* 77.6*  MCH 23.4* 23.0*  MCHC  29.7* 29.6*  RDW 16.1* 16.0*  PLT 283 263    Cardiac Enzymes Recent Labs  Lab 11/09/18 1700  TROPONINI 0.30*   No results for input(s): TROPIPOC in the last 168 hours.   BNPNo results for input(s): BNP, PROBNP in the last 168 hours.   DDimer No results for input(s): DDIMER in the last 168 hours.   Radiology    Dg Chest 2 View  Result Date: 11/09/2018 CLINICAL DATA:  Central chest pain with shortness of breath since this morning. EXAM: CHEST - 2 VIEW COMPARISON:  12/21/2014 FINDINGS: Lungs are adequately inflated without focal airspace consolidation or effusion. Cardiomediastinal silhouette and remainder of the exam is unchanged. IMPRESSION: No active cardiopulmonary disease. Electronically Signed   By: Elberta Fortis M.D.   On: 11/09/2018 17:41    Cardiac Studies   N/A  Patient Profile     57 y.o. female with PMH of HTN, obesity and former smoking presented with chest pain and found to have elevated troponin.  Assessment & Plan    1. NSTEMI: had 2 hours of chest pain yesterday while at [facility] Resolved by the time she came to ED.   - plan for cath tomorrow given elevated trop.  No obvious EKG changes. No chest pain overnight. Continue IV heparin.   - serial troponin. Obtain Echo  - Risk and benefit of procedure explained to the patient who display clear understanding and agree to proceed.  Discussed with patient possible procedural risk include bleeding, vascular injury, renal injury, arrythmia, MI, stroke and loss of limb or life  2. HTN:   3. DM II: hgb A1C 6.7 in 2015, per patient, it was managed with diet and exercise. Repeat Hemoglobin A1C   For questions or updates, please contact [facility] Please consult [email or web address] for contact info under     Signed, Azalee CourseHao Meng, PA  11/10/2018, 8:52 AM     Attending note:  Patient seen and examined.  I reviewed history and physical by cardiology fellow, also discussed discussed the case with Mr. Eyvonne LeftMeng PA-C.  Lindsay [name]  is chest pain-free this morning.  On examination she appears comfortable.  Afebrile, heart rate in the 60s to 70s in sinus rhythm by telemetry which I personally reviewed.  Systolic blood pressure ranging 100-130 range.  Lungs are clear without labored breathing.  Cardiac exam with RRR no gallop.  Lab work shows potassium 3.4, BUN 18, creatinine 0.76, troponin I 0.30, LDL 96, hemoglobin 11.6, platelets 263.  ECG shows nonspecific T wave changes.  Patient will be placed on the schedule for diagnostic cardiac catheterization tomorrow.  Risks and benefits discussed. Continue aspirin, Lipitor, IV nitroglycerin, and IV heparin.  Lab work a.m.  Jonelle SidleSamuel G. , M.D., F.A.C.C.

## 2018-11-10 NOTE — Progress Notes (Signed)
ANTICOAGULATION CONSULT NOTE   Pharmacy Consult for heparin Indication: chest pain/ACS  Allergies  Allergen Reactions  . Penicillins Anaphylaxis  . Latex   . Sulfa Antibiotics Hives  . Ceftin [Cefuroxime Axetil] Rash    Tolerates Keflex  . Doxycycline Rash  . Other Rash    ALL MYCINS    Patient Measurements: Height: 5' 7.5" (171.5 cm) Weight: 275 lb 6.4 oz (124.9 kg) IBW/kg (Calculated) : 62.75 Heparin Dosing Weight: 89.7kg  Vital Signs: Temp: 98.3 F (36.8 C) (12/01 0431) Temp Source: Oral (12/01 0431) BP: 103/64 (12/01 0431) Pulse Rate: 64 (12/01 0431)  Labs: Recent Labs    11/09/18 1700 11/10/18 0040 11/10/18 0418 11/10/18 0822 11/10/18 0942  HGB 12.4  --  11.6*  --   --   HCT 41.7  --  39.2  --   --   PLT 283  --  263  --   --   LABPROT  --   --  13.3  --   --   INR  --   --  1.02  --   --   HEPARINUNFRC  --  0.41  --  0.34  --   CREATININE 0.78  --  0.76  --   --   TROPONINI 0.30*  --   --   --  0.37*    Estimated Creatinine Clearance: 106 mL/min (by C-G formula based on SCr of 0.76 mg/dL).   Medical History: Past Medical History:  Diagnosis Date  . Allergic rhinitis, cause unspecified    seasonal  . Essential hypertension, benign 2013  . GERD (gastroesophageal reflux disease)   . Joint pain   . Multiple drug allergies   . Obesity   . Symptomatic menopausal or female climacteric states   . Wears glasses   . Wears partial dentures    upper    Medications:  Infusions:  . heparin 1,200 Units/hr (11/09/18 1900)  . nitroGLYCERIN Stopped (11/09/18 2343)    Assessment: [age] yof presented to the ED with CP. Troponin elevated on IV heparin with plans for cath on 12/2  -heparin level at goal  Goal of Therapy:  Heparin level 0.3-0.7 units/ml Monitor platelets by anticoagulation protocol: Yes   Plan:  Continue heparin gtt 1200 units/hr Daily heparin level and CBC  Harland GermanAndrew Shakeria Robinette, PharmD Clinical Pharmacist **Pharmacist phone directory can  now be found on [email or web address] ([email or web address]).  Listed under [facility].

## 2018-11-10 NOTE — Progress Notes (Signed)
Pt came up to the nurse station stating that she was having a chest pain. States it was a dull pain in her mid chest almost same as when she first had a chest pain. Stated that we cannot talk about her having a chest pain in front of her husband who was in her room that was why she came to the nurse station.  EKG done, Nitro SL x1 given with relief.  Cardiology made aware.  Hinton DyerYoko Raiquan Chandler, RN

## 2018-11-11 ENCOUNTER — Encounter (HOSPITAL_COMMUNITY)
Admission: EM | Disposition: A | Discharge status: Home / Self Care | Payer: Self-pay | Source: Home / Self Care | Attending: Internal Medicine

## 2018-11-11 HISTORY — PX: LEFT HEART CATH AND CORONARY ANGIOGRAPHY: CATH118249

## 2018-11-11 LAB — CBC
HCT: 36.9 % (ref 36.0–46.0)
Hemoglobin: 11.2 g/dL — ABNORMAL LOW (ref 12.0–15.0)
MCH: 23.8 pg — ABNORMAL LOW (ref 26.0–34.0)
MCHC: 30.4 g/dL (ref 30.0–36.0)
MCV: 78.5 fL — ABNORMAL LOW (ref 80.0–100.0)
NRBC: 0 % (ref 0.0–0.2)
Platelets: 230 10*3/uL (ref 150–400)
RBC: 4.7 MIL/uL (ref 3.87–5.11)
RDW: 16.1 % — ABNORMAL HIGH (ref 11.5–15.5)
WBC: 4.8 10*3/uL (ref 4.0–10.5)

## 2018-11-11 LAB — BASIC METABOLIC PANEL
Anion gap: 3 — ABNORMAL LOW (ref 5–15)
BUN: 13 mg/dL (ref 6–20)
CHLORIDE: 104 mmol/L (ref 98–111)
CO2: 33 mmol/L — ABNORMAL HIGH (ref 22–32)
Calcium: 8.4 mg/dL — ABNORMAL LOW (ref 8.9–10.3)
Creatinine, Ser: 0.84 mg/dL (ref 0.44–1.00)
GFR calc Af Amer: >60 mL/min (ref >60)
GFR calc non Af Amer: >60 mL/min (ref >60)
Glucose, Bld: 131 mg/dL — ABNORMAL HIGH (ref 70–99)
Potassium: 4.1 mmol/L (ref 3.5–5.1)
Sodium: 140 mmol/L (ref 135–145)

## 2018-11-11 LAB — ECHOCARDIOGRAM COMPLETE
Height: 67.5 in
Weight: 4406.4 oz

## 2018-11-11 LAB — HEPARIN LEVEL (UNFRACTIONATED): Heparin Unfractionated: 0.51 IU/mL (ref 0.30–0.70)

## 2018-11-11 SURGERY — LEFT HEART CATH AND CORONARY ANGIOGRAPHY
Anesthesia: LOCAL

## 2018-11-11 MED ORDER — HEPARIN (PORCINE) IN NACL 1000-0.9 UT/500ML-% IV SOLN
INTRAVENOUS | Status: AC
  Filled 2018-11-11: qty 1000

## 2018-11-11 MED ORDER — SODIUM CHLORIDE 0.9% FLUSH
3.0000 mL | Freq: Two times a day (BID) | INTRAVENOUS | Status: DC
  Administered 2018-11-12 – 2018-11-14 (×5): 3 mL via INTRAVENOUS

## 2018-11-11 MED ORDER — HEPARIN SODIUM (PORCINE) 1000 UNIT/ML IJ SOLN
INTRAMUSCULAR | Status: AC
  Filled 2018-11-11: qty 1

## 2018-11-11 MED ORDER — NITROGLYCERIN 1 MG/10 ML FOR IR/CATH LAB
INTRA_ARTERIAL | Status: DC | PRN
  Administered 2018-11-11: 200 ug via INTRACORONARY

## 2018-11-11 MED ORDER — IOHEXOL 350 MG/ML SOLN
INTRAVENOUS | Status: DC | PRN
  Administered 2018-11-11: 65 mL

## 2018-11-11 MED ORDER — MIDAZOLAM HCL 2 MG/2ML IJ SOLN
INTRAMUSCULAR | Status: DC | PRN
  Administered 2018-11-11: 1 mg via INTRAVENOUS

## 2018-11-11 MED ORDER — HEPARIN (PORCINE) IN NACL 1000-0.9 UT/500ML-% IV SOLN
INTRAVENOUS | Status: DC | PRN
  Administered 2018-11-11 (×2): 500 mL

## 2018-11-11 MED ORDER — FENTANYL CITRATE (PF) 100 MCG/2ML IJ SOLN
INTRAMUSCULAR | Status: DC | PRN
  Administered 2018-11-11: 25 ug via INTRAVENOUS

## 2018-11-11 MED ORDER — SODIUM CHLORIDE 0.9 % IV SOLN: INTRAVENOUS | Status: AC

## 2018-11-11 MED ORDER — SODIUM CHLORIDE 0.9% FLUSH: 3.0000 mL | INTRAVENOUS | Status: DC | PRN

## 2018-11-11 MED ORDER — VERAPAMIL HCL 2.5 MG/ML IV SOLN
INTRAVENOUS | Status: DC | PRN
  Administered 2018-11-11: 10 mL via INTRA_ARTERIAL

## 2018-11-11 MED ORDER — ISOSORBIDE MONONITRATE ER 30 MG PO TB24
15.0000 mg | ORAL_TABLET | Freq: Every day | ORAL | Status: DC
  Administered 2018-11-11: 15 mg via ORAL
  Filled 2018-11-11: qty 1

## 2018-11-11 MED ORDER — MIDAZOLAM HCL 2 MG/2ML IJ SOLN
INTRAMUSCULAR | Status: AC
  Filled 2018-11-11: qty 2

## 2018-11-11 MED ORDER — ENOXAPARIN SODIUM 40 MG/0.4ML ~~LOC~~ SOLN
40.0000 mg | SUBCUTANEOUS | Status: DC
  Administered 2018-11-12 – 2018-11-14 (×3): 40 mg via SUBCUTANEOUS
  Filled 2018-11-11 (×3): qty 0.4

## 2018-11-11 MED ORDER — LIDOCAINE HCL (PF) 1 % IJ SOLN
INTRAMUSCULAR | Status: DC | PRN
  Administered 2018-11-11: 2 mL

## 2018-11-11 MED ORDER — LIDOCAINE HCL (PF) 1 % IJ SOLN
INTRAMUSCULAR | Status: AC
  Filled 2018-11-11: qty 30

## 2018-11-11 MED ORDER — HEPARIN SODIUM (PORCINE) 1000 UNIT/ML IJ SOLN
INTRAMUSCULAR | Status: DC | PRN
  Administered 2018-11-11: 5000 [IU] via INTRAVENOUS

## 2018-11-11 MED ORDER — VERAPAMIL HCL 2.5 MG/ML IV SOLN
INTRAVENOUS | Status: AC
  Filled 2018-11-11: qty 2

## 2018-11-11 MED ORDER — FENTANYL CITRATE (PF) 100 MCG/2ML IJ SOLN
INTRAMUSCULAR | Status: AC
  Filled 2018-11-11: qty 2

## 2018-11-11 MED ORDER — SODIUM CHLORIDE 0.9 % IV SOLN: 250.0000 mL | INTRAVENOUS | Status: DC | PRN

## 2018-11-11 MED ORDER — NITROGLYCERIN 1 MG/10 ML FOR IR/CATH LAB
INTRA_ARTERIAL | Status: AC
  Filled 2018-11-11: qty 10

## 2018-11-11 SURGICAL SUPPLY — 10 items
CATH IMPULSE 5F ANG/FL3.5 (CATHETERS) ×1 IMPLANT
CATH INFINITI 5FR MPB2 (CATHETERS) ×1 IMPLANT
DEVICE RAD COMP TR BAND LRG (VASCULAR PRODUCTS) ×1 IMPLANT
GLIDESHEATH SLEND SS 6F .021 (SHEATH) ×1 IMPLANT
GUIDEWIRE INQWIRE 1.5J.035X260 (WIRE) IMPLANT
INQWIRE 1.5J .035X260CM (WIRE) ×2
KIT HEART LEFT (KITS) ×2 IMPLANT
PACK CARDIAC CATHETERIZATION (CUSTOM PROCEDURE TRAY) ×2 IMPLANT
TRANSDUCER W/STOPCOCK (MISCELLANEOUS) ×2 IMPLANT
TUBING CIL FLEX 10 FLL-RA (TUBING) ×2 IMPLANT

## 2018-11-11 NOTE — Progress Notes (Signed)
ANTICOAGULATION CONSULT NOTE   Pharmacy Consult for heparin Indication: chest pain/ACS  Allergies  Allergen Reactions  . Penicillins Anaphylaxis  . Latex   . Sulfa Antibiotics Hives  . Ceftin [Cefuroxime Axetil] Rash    Tolerates Keflex  . Doxycycline Rash  . Other Rash    ALL MYCINS    Patient Measurements: Height: 5' 7.5" (171.5 cm) Weight: 276 lb 12.8 oz (125.6 kg) IBW/kg (Calculated) : 62.75 Heparin Dosing Weight: 89.7kg  Vital Signs: Temp: 98.1 F (36.7 C) (12/02 0629) Temp Source: Oral (12/02 0629) BP: 116/71 (12/02 0629) Pulse Rate: 62 (12/02 0629)  Labs: Recent Labs    11/09/18 1700 11/10/18 0040 11/10/18 0418 11/10/18 0822 11/10/18 0942 11/10/18 1457 11/10/18 2137 11/11/18 0346  HGB 12.4  --  11.6*  --   --   --   --  11.2*  HCT 41.7  --  39.2  --   --   --   --  36.9  PLT 283  --  263  --   --   --   --  230  LABPROT  --   --  13.3  --   --   --   --   --   INR  --   --  1.02  --   --   --   --   --   HEPARINUNFRC  --  0.41  --  0.34  --   --   --  0.51  CREATININE 0.78  --  0.76  --   --   --   --  0.84  TROPONINI 0.30*  --   --   --  0.37* 0.29* 0.24*  --     Estimated Creatinine Clearance: 101.3 mL/min (by C-G formula based on SCr of 0.84 mg/dL).   Medical History: Past Medical History:  Diagnosis Date  . Allergic rhinitis, cause unspecified    seasonal  . Essential hypertension, benign 2013  . GERD (gastroesophageal reflux disease)   . Joint pain   . Multiple drug allergies   . Obesity   . Symptomatic menopausal or female climacteric states   . Wears glasses   . Wears partial dentures    upper    Medications:  Infusions:  . sodium chloride    . sodium chloride 1 mL/kg/hr (11/11/18 0549)  . heparin 1,200 Units/hr (11/10/18 1323)  . nitroGLYCERIN Stopped (11/09/18 2343)    Assessment: 89 yof presented to the ED with CP. Troponin elevated on IV heparin with plans for cath on 12/2  -heparin level at goal and CBC is stable. No  bleed issues documented.  Goal of Therapy:  Heparin level 0.3-0.7 units/ml Monitor platelets by anticoagulation protocol: Yes   Plan:  Continue heparin gtt 1200 units/hr Monitor daily heparin level and CBC, s/sx bleeding Cath planned for 12/2  Babs BertinHaley Alyxander Kollmann, PharmD, BCPS Clinical Pharmacist Clinical phone [phone] Please check AMION for all [facility] contact numbers 11/11/2018 8:32 AM

## 2018-11-11 NOTE — Interval H&P Note (Signed)
History and Physical Interval Note:  11/11/2018 2:47 PM  Lindsay BilesPamela H [name]  has presented today for cardiac catheterization, with the diagnosis of NSTEMI. The various methods of treatment have been discussed with the patient and family. After consideration of risks, benefits and other options for treatment, the patient has consented to  Procedure(s): LEFT HEART CATH AND CORONARY ANGIOGRAPHY (N/A) as a surgical intervention .  The patient's history has been reviewed, patient examined, no change in status, stable for surgery.  I have reviewed the patient's chart and labs.  Questions were answered to the patient's satisfaction.    Cath Lab Visit (complete for each Cath Lab visit)  Clinical Evaluation Leading to the Procedure:   ACS: Yes.    Non-ACS:  N/A  Grier Czerwinski

## 2018-11-11 NOTE — Brief Op Note (Signed)
BRIEF CARDIAC CATHETERIZATION NOTE  DATE: 11/11/2018 TIME: 3:37 PM  PATIENT:  Varney BilesPamela H Harris  57 y.o. female  PRE-OPERATIVE DIAGNOSIS:  NSTEMI  POST-OPERATIVE DIAGNOSIS:  NSTEMI  PROCEDURE:  Procedure(s): LEFT HEART CATH AND CORONARY ANGIOGRAPHY (N/A)  SURGEON:  Surgeon(s) and Role:    * Fae Blossom, MD - Primary  FINDINGS: 1. Mild ostial LAD and distal LCx stenosis that improve with intracoronary NTG suggestive of vasospasm.  Otherwise, no angiographically significant CAD. 2. Normal LVEF with mildly elevated LVEDP suggestive of diastolic dysfunction.  RECOMMENDATIONS: 1. Stop nitroglycerin infusion and start isosorbide mononitrate. 2. Consider evaluation for alternate causes of chest pain and mild troponin elevation, including pulmonary embolism. 3. Secondary prevention of CAD.  Yvonne Kendallhristopher Donta Fuster, MD [facility] Pager: [phone]

## 2018-11-12 ENCOUNTER — Inpatient Hospital Stay (HOSPITAL_COMMUNITY): Payer: BLUE CROSS/BLUE SHIELD

## 2018-11-12 ENCOUNTER — Encounter (HOSPITAL_COMMUNITY): Payer: Self-pay | Admitting: Internal Medicine

## 2018-11-12 LAB — BASIC METABOLIC PANEL
Anion gap: 8 (ref 5–15)
BUN: 7 mg/dL (ref 6–20)
CHLORIDE: 106 mmol/L (ref 98–111)
CO2: 24 mmol/L (ref 22–32)
Calcium: 8.3 mg/dL — ABNORMAL LOW (ref 8.9–10.3)
Creatinine, Ser: 0.85 mg/dL (ref 0.44–1.00)
GFR calc Af Amer: >60 mL/min (ref >60)
GFR calc non Af Amer: >60 mL/min (ref >60)
Glucose, Bld: 124 mg/dL — ABNORMAL HIGH (ref 70–99)
Potassium: 3.5 mmol/L (ref 3.5–5.1)
Sodium: 138 mmol/L (ref 135–145)

## 2018-11-12 LAB — CBC
HCT: 35.1 % — ABNORMAL LOW (ref 36.0–46.0)
Hemoglobin: 10.4 g/dL — ABNORMAL LOW (ref 12.0–15.0)
MCH: 23.3 pg — AB (ref 26.0–34.0)
MCHC: 29.6 g/dL — ABNORMAL LOW (ref 30.0–36.0)
MCV: 78.5 fL — ABNORMAL LOW (ref 80.0–100.0)
Platelets: 246 10*3/uL (ref 150–400)
RBC: 4.47 MIL/uL (ref 3.87–5.11)
RDW: 16.1 % — ABNORMAL HIGH (ref 11.5–15.5)
WBC: 4.4 10*3/uL (ref 4.0–10.5)
nRBC: 0 % (ref 0.0–0.2)

## 2018-11-12 MED ORDER — ISOSORBIDE MONONITRATE ER 30 MG PO TB24
30.0000 mg | ORAL_TABLET | Freq: Every day | ORAL | Status: DC
  Administered 2018-11-12 – 2018-11-14 (×3): 30 mg via ORAL
  Filled 2018-11-12 (×3): qty 1

## 2018-11-12 MED ORDER — IOPAMIDOL (ISOVUE-370) INJECTION 76%
100.0000 mL | Freq: Once | INTRAVENOUS | Status: AC | PRN
  Administered 2018-11-12: 100 mL via INTRAVENOUS

## 2018-11-12 MED ORDER — HYDROCODONE-ACETAMINOPHEN 5-325 MG PO TABS
1.0000 | ORAL_TABLET | Freq: Once | ORAL | Status: AC
  Administered 2018-11-12: 1 via ORAL
  Filled 2018-11-12: qty 1

## 2018-11-12 NOTE — Plan of Care (Signed)

## 2018-11-12 NOTE — Progress Notes (Signed)
Progress Note  Patient Name: Lindsay [name] Date of Encounter: 11/12/2018  Primary Cardiologist: No primary care provider on file.   Subjective   Currently chest pain free, feels that nitro drip and imdur have helped. Has tender right forearm after radial cath.  Inpatient Medications    Scheduled Meds: . aspirin EC  81 mg Oral Daily  . atorvastatin  80 mg Oral q1800  . enoxaparin (LOVENOX) injection  40 mg Subcutaneous Q24H  . isosorbide mononitrate  30 mg Oral Daily  . sodium chloride flush  3 mL Intravenous Q12H   Continuous Infusions: . sodium chloride     PRN Meds: sodium chloride, acetaminophen, ondansetron (ZOFRAN) IV, sodium chloride flush   Vital Signs    Vitals:   11/11/18 1727 11/11/18 1742 11/11/18 2033 11/12/18 0500  BP: (!) 146/71 135/65 (!) 142/69 116/66  Pulse: 71 62 77 (!) 56  Resp:   18 18  Temp:   98.7 F (37.1 C) 98.3 F (36.8 C)  TempSrc:   Oral Oral  SpO2: 97% 97% 95% 97%  Weight:    129.3 kg  Height:        Intake/Output Summary (Last 24 hours) at 11/12/2018 [id number] Last data filed at 11/12/2018 0500 Gross per 24 hour  Intake 2111.25 ml  Output -  Net 2111.25 ml   Filed Weights   11/10/18 0438 11/11/18 0629 11/12/18 0500  Weight: 124.9 kg 125.6 kg 129.3 kg    Telemetry    nsr - Personally Reviewed  ECG    No new- Personally Reviewed  Physical Exam   GEN: No acute distress.   Neck: No JVD Cardiac: regular rhythm, normal rate, no murmurs, rubs, or gallops.  Respiratory: Clear to auscultation bilaterally. GI: Soft, nontender, non-distended  MS: No edema, right forearm soft swelling with tenderness. No induration, no erythema or ecchymosis. Neuro:  Nonfocal, specifically neuro exam of right hand is intact. Psych: Normal affect   Labs    Chemistry Recent Labs  Lab 11/09/18 1700 11/10/18 0418 11/11/18 0346  NA 140 138 140  K 3.5 3.4* 4.1  CL 101 104 104  CO2 29 25 33*  GLUCOSE 125* 133* 131*  BUN 13 18 13     CREATININE 0.78 0.76 0.84  CALCIUM 9.4 8.7* 8.4*  GFRNONAA >60 >60 >60  GFRAA >60 >60 >60  ANIONGAP 10 9 3*     Hematology Recent Labs  Lab 11/10/18 0418 11/11/18 0346 11/12/18 0416  WBC 6.3 4.8 4.4  RBC 5.05 4.70 4.47  HGB 11.6* 11.2* 10.4*  HCT 39.2 36.9 35.1*  MCV 77.6* 78.5* 78.5*  MCH 23.0* 23.8* 23.3*  MCHC 29.6* 30.4 29.6*  RDW 16.0* 16.1* 16.1*  PLT 263 230 246    Cardiac Enzymes Recent Labs  Lab 11/09/18 1700 11/10/18 0942 11/10/18 1457 11/10/18 2137  TROPONINI 0.30* 0.37* 0.29* 0.24*   No results for input(s): TROPIPOC in the last 168 hours.   BNPNo results for input(s): BNP, PROBNP in the last 168 hours.   DDimer No results for input(s): DDIMER in the last 168 hours.   Radiology    No results found.  Cardiac Studies  Cath 11/11/18 FINDINGS: 1. Mild ostial LAD and distal LCx stenosis that improve with intracoronary NTG suggestive of vasospasm.  Otherwise, no angiographically significant CAD. 2. Normal LVEF with mildly elevated LVEDP suggestive of diastolic dysfunction.  RECOMMENDATIONS: 1. Stop nitroglycerin infusion and start isosorbide mononitrate. 2. Consider evaluation for alternate causes of chest pain and mild troponin elevation,  including pulmonary embolism. 3. Secondary prevention of CAD.  Echo 11/10/18 Study Conclusions  - Left ventricle: The cavity size was normal. Wall thickness was   increased in a pattern of mild LVH. Systolic function was normal.   The estimated ejection fraction was in the range of 55% to 60%.   Wall motion was normal; there were no regional wall motion   abnormalities. Left ventricular diastolic function parameters   were normal.  Impressions:  - Normal LV function; mild LVH; mild TR.  Patient Profile     57 y.o. female with PMH of HTN, obesity and former smoking presented with chest pain and found to have elevated troponin, with mild CAD and possible coronary spasm.   Assessment & Plan   Principal  Problem:   NSTEMI (non-ST elevated myocardial infarction) (HCC) Active Problems:   Essential hypertension, benign   NSTEMI - mild non obstructive CAD. Will continue ASA and atorvastatin.   Possible coronary spasm as etiology of NSTEMI - will continue imdur and increase dose today to 30 mg to evaluate for tolerance.  Elevated troponin - will obtain CT PE study today to ensure trops were not from PE. Repeat BMET with repeat contrast administration.  Right arm swelling/hematoma s/p right radial cath - will obtain ultrasound of right arm to ensure no concerning findings. Warm compress and conservative measures.  Elevated blood pressure - may need additional antihypertensive therapy For questions or updates, please contact [facility] Please consult [email or web address] for contact info under        Signed, Parke Poisson, MD  11/12/2018, 9:23 AM

## 2018-11-13 LAB — CBC
HCT: 34.3 % — ABNORMAL LOW (ref 36.0–46.0)
Hemoglobin: 10.4 g/dL — ABNORMAL LOW (ref 12.0–15.0)
MCH: 23.6 pg — AB (ref 26.0–34.0)
MCHC: 30.3 g/dL (ref 30.0–36.0)
MCV: 78 fL — ABNORMAL LOW (ref 80.0–100.0)
Platelets: 230 10*3/uL (ref 150–400)
RBC: 4.4 MIL/uL (ref 3.87–5.11)
RDW: 16.1 % — ABNORMAL HIGH (ref 11.5–15.5)
WBC: 4.4 10*3/uL (ref 4.0–10.5)
nRBC: 0 % (ref 0.0–0.2)

## 2018-11-13 MED ORDER — NITROGLYCERIN 0.4 MG SL SUBL
SUBLINGUAL_TABLET | SUBLINGUAL | Status: AC
  Administered 2018-11-13: 0.4 mg
  Filled 2018-11-13: qty 1

## 2018-11-13 MED ORDER — TRAMADOL HCL 50 MG PO TABS
50.0000 mg | ORAL_TABLET | Freq: Once | ORAL | Status: AC
  Administered 2018-11-13: 50 mg via ORAL
  Filled 2018-11-13: qty 1

## 2018-11-13 MED ORDER — HYDROCODONE-ACETAMINOPHEN 5-325 MG PO TABS
1.0000 | ORAL_TABLET | Freq: Four times a day (QID) | ORAL | Status: AC | PRN
  Administered 2018-11-13 – 2018-11-14 (×2): 1 via ORAL
  Filled 2018-11-13 (×2): qty 1

## 2018-11-13 MED ORDER — AMLODIPINE BESYLATE 2.5 MG PO TABS
2.5000 mg | ORAL_TABLET | Freq: Every day | ORAL | Status: DC
  Administered 2018-11-13 – 2018-11-14 (×2): 2.5 mg via ORAL
  Filled 2018-11-13 (×2): qty 1

## 2018-11-13 NOTE — Progress Notes (Signed)
Progress Note  Patient Name: Lindsay [name] Date of Encounter: 11/13/2018  Primary Cardiologist: No primary care provider on file.   Subjective   Having 7/10 chest pain during my exam.  Inpatient Medications    Scheduled Meds: . aspirin EC  81 mg Oral Daily  . atorvastatin  80 mg Oral q1800  . enoxaparin (LOVENOX) injection  40 mg Subcutaneous Q24H  . isosorbide mononitrate  30 mg Oral Daily  . sodium chloride flush  3 mL Intravenous Q12H   Continuous Infusions: . sodium chloride     PRN Meds: sodium chloride, acetaminophen, ondansetron (ZOFRAN) IV, sodium chloride flush   Vital Signs    Vitals:   11/12/18 0938 11/12/18 1428 11/12/18 2155 11/13/18 0432  BP: 123/80 118/63 (!) 119/54 136/76  Pulse: 69 72 66 67  Resp:  (!) 26 18 19   Temp:  98.7 F (37.1 C) 97.6 F (36.4 C) 98 F (36.7 C)  TempSrc:  Oral Oral Oral  SpO2: 100% 100% 95% 97%  Weight:    127.4 kg  Height:        Intake/Output Summary (Last 24 hours) at 11/13/2018 [id number] Last data filed at 11/13/2018 0500 Gross per 24 hour  Intake 840 ml  Output -  Net 840 ml   Filed Weights   11/11/18 0629 11/12/18 0500 11/13/18 0432  Weight: 125.6 kg 129.3 kg 127.4 kg    Telemetry    SR with PACs and brief episode of SVT - Personally Reviewed  ECG    SR with Sinus arrhythmia - Personally Reviewed  Physical Exam   GEN: No acute distress.   Neck: No JVD Cardiac: regular rhythm, normal rate, no murmurs, rubs, or gallops.  Respiratory: Clear to auscultation bilaterally. GI: Soft, nontender, non-distended  MS: No edema; No deformity. Improved right forearm swelling. Neuro:  Nonfocal  Psych: Normal affect   Labs    Chemistry Recent Labs  Lab 11/10/18 0418 11/11/18 0346 11/12/18 1006  NA 138 140 138  K 3.4* 4.1 3.5  CL 104 104 106  CO2 25 33* 24  GLUCOSE 133* 131* 124*  BUN 18 13 7   CREATININE 0.76 0.84 0.85  CALCIUM 8.7* 8.4* 8.3*  GFRNONAA >60 >60 >60  GFRAA >60 >60 >60  ANIONGAP 9 3* 8       Hematology Recent Labs  Lab 11/11/18 0346 11/12/18 0416 11/13/18 0322  WBC 4.8 4.4 4.4  RBC 4.70 4.47 4.40  HGB 11.2* 10.4* 10.4*  HCT 36.9 35.1* 34.3*  MCV 78.5* 78.5* 78.0*  MCH 23.8* 23.3* 23.6*  MCHC 30.4 29.6* 30.3  RDW 16.1* 16.1* 16.1*  PLT 230 246 230    Cardiac Enzymes Recent Labs  Lab 11/09/18 1700 11/10/18 0942 11/10/18 1457 11/10/18 2137  TROPONINI 0.30* 0.37* 0.29* 0.24*   No results for input(s): TROPIPOC in the last 168 hours.   BNPNo results for input(s): BNP, PROBNP in the last 168 hours.   DDimer No results for input(s): DDIMER in the last 168 hours.   Radiology    Ct Angio Chest Pe W Or Wo Contrast  Result Date: 11/12/2018 CLINICAL DATA:  Chest pain. EXAM: CT ANGIOGRAPHY CHEST WITH CONTRAST TECHNIQUE: Multidetector CT imaging of the chest was performed using the standard protocol during bolus administration of intravenous contrast. Multiplanar CT image reconstructions and MIPs were obtained to evaluate the vascular anatomy. CONTRAST:  80 mL ISOVUE-370 IOPAMIDOL (ISOVUE-370) INJECTION 76% COMPARISON:  Radiographs of November 09, 2018. FINDINGS: Cardiovascular: Satisfactory opacification of the pulmonary arteries  to the segmental level. No evidence of pulmonary embolism. Normal heart size. No pericardial effusion. Mediastinum/Nodes: Large hiatal hernia is noted. Thyroid gland is unremarkable. No adenopathy is noted. Lungs/Pleura: Lungs are clear. No pleural effusion or pneumothorax. Upper Abdomen: No acute abnormality. Musculoskeletal: No chest wall abnormality. No acute or significant osseous findings. Review of the MIP images confirms the above findings. IMPRESSION: No definite evidence of pulmonary embolus. Large sliding-type hiatal hernia. No other abnormality seen in the chest. Electronically Signed   By: Lupita Raider, M.D.   On: 11/12/2018 10:20   [place] Rt Upper Extrem Ltd Soft Tissue Non Vascular  Result Date: 11/12/2018 CLINICAL DATA:  Heart  catheterization yesterday with right radial access. Evaluate for hematoma. EXAM: ULTRASOUND RIGHT UPPER EXTREMITY LIMITED TECHNIQUE: Ultrasound examination of the upper extremity soft tissues was performed in the area of clinical concern. COMPARISON:  None. FINDINGS: Focused ultrasound of the distal right forearm near the radial artery demonstrates no discrete fluid collection or hematoma. No soft tissue mass. IMPRESSION: No hematoma or fluid collection visualized. Electronically Signed   By: Obie Dredge M.D.   On: 11/12/2018 12:46    Cardiac Studies   No new  Patient Profile     57 y.o.femalewith PMH of HTN, obesity and former smoking presented with chest pain and found to have elevated troponin, with mild CAD and possible coronary spasm.   Assessment & Plan   Principal Problem:   NSTEMI (non-ST elevated myocardial infarction) [facility]) Active Problems:   Essential hypertension, benign   She is having active chest pain currently.  We have administered a sublingual nitroglycerin and obtained an ECG.  With sublingual nitro her chest pain has improved, and her ECG is unremarkable for ischemia.  We will monitor her today and I will add amlodipine 2.5 mg daily to her Imdur 30 mg.   Her CT PE study and right forearm ultrasound were unremarkable for acute cardiovascular findings.  She does have a large hiatal hernia, and may experience GERD as a result.  For questions or updates, please contact [facility] Please consult [email or web address] for contact info under        Signed, Parke Poisson, MD  11/13/2018, 9:09 AM

## 2018-11-14 DIAGNOSIS — I201 Angina pectoris with documented spasm: Secondary | ICD-10-CM

## 2018-11-14 LAB — CBC
HCT: 34 % — ABNORMAL LOW (ref 36.0–46.0)
Hemoglobin: 10.2 g/dL — ABNORMAL LOW (ref 12.0–15.0)
MCH: 23.6 pg — ABNORMAL LOW (ref 26.0–34.0)
MCHC: 30 g/dL (ref 30.0–36.0)
MCV: 78.5 fL — ABNORMAL LOW (ref 80.0–100.0)
Platelets: 221 10*3/uL (ref 150–400)
RBC: 4.33 MIL/uL (ref 3.87–5.11)
RDW: 16.3 % — ABNORMAL HIGH (ref 11.5–15.5)
WBC: 5.2 10*3/uL (ref 4.0–10.5)
nRBC: 0 % (ref 0.0–0.2)

## 2018-11-14 MED ORDER — ATORVASTATIN CALCIUM 80 MG PO TABS: 80.0000 mg | ORAL_TABLET | Freq: Every day | ORAL | 4 refills | Status: DC

## 2018-11-14 MED ORDER — AMLODIPINE BESYLATE 2.5 MG PO TABS: 2.5000 mg | ORAL_TABLET | Freq: Every day | ORAL | 4 refills | Status: DC

## 2018-11-14 MED ORDER — CALCIUM CARBONATE ANTACID 500 MG PO CHEW
1.0000 | CHEWABLE_TABLET | Freq: Once | ORAL | Status: AC
  Administered 2018-11-14: 200 mg via ORAL
  Filled 2018-11-14: qty 1

## 2018-11-14 MED ORDER — ISOSORBIDE MONONITRATE ER 30 MG PO TB24: 15.0000 mg | ORAL_TABLET | Freq: Every day | ORAL | Status: DC

## 2018-11-14 MED ORDER — NITROGLYCERIN 0.4 MG SL SUBL
0.4000 mg | SUBLINGUAL_TABLET | SUBLINGUAL | Status: DC | PRN
  Administered 2018-11-14: 0.4 mg via SUBLINGUAL

## 2018-11-14 MED ORDER — ISOSORBIDE MONONITRATE ER 30 MG PO TB24: 15.0000 mg | ORAL_TABLET | Freq: Every day | ORAL | 4 refills | Status: DC

## 2018-11-14 MED ORDER — AMLODIPINE BESYLATE 2.5 MG PO TABS: 2.5000 mg | ORAL_TABLET | Freq: Every day | ORAL | Status: DC

## 2018-11-14 MED ORDER — ISOSORBIDE MONONITRATE ER 30 MG PO TB24: 30.0000 mg | ORAL_TABLET | Freq: Every day | ORAL | 4 refills | Status: DC

## 2018-11-14 MED ORDER — ASPIRIN 81 MG PO TBEC: 81.0000 mg | DELAYED_RELEASE_TABLET | Freq: Every day | ORAL | Status: DC

## 2018-11-14 MED ORDER — NITROGLYCERIN 0.4 MG SL SUBL: 0.4000 mg | SUBLINGUAL_TABLET | SUBLINGUAL | 0 refills | Status: DC | PRN

## 2018-11-14 MED ORDER — AMLODIPINE BESYLATE 5 MG PO TABS: 5.0000 mg | ORAL_TABLET | Freq: Every day | ORAL | Status: DC

## 2018-11-14 MED ORDER — NITROGLYCERIN 0.4 MG SL SUBL
SUBLINGUAL_TABLET | SUBLINGUAL | Status: AC
  Administered 2018-11-14: 0.4 mg via SUBLINGUAL
  Filled 2018-11-14: qty 1

## 2018-11-14 NOTE — Discharge Instructions (Signed)

## 2018-11-14 NOTE — Progress Notes (Addendum)
Starting discharge process when patient told RN of "chest discomfort /4 out of 10), not chest pain".  Georgie ChardJill McDaniel NP notified via amion.  NT to obtain EKG. Will hold off on discharge for now.    Georgie ChardJill McDaniel, NP stated to give nitro. BP=128/75 at 1452  1 nitro sublingual given at 1454 EKG obtained, [place] AM[place]  Post Nitro discomfort 2 out of 10, BP = 121/76 Patient is stating over and over she is going home today.

## 2018-11-14 NOTE — Discharge Summary (Signed)
Discharge Summary    Patient ID: Lindsay [name] MRN: [id number]; DOB: 05/30/1961  Admit date: 11/09/2018 Discharge date: 11/14/2018  Primary Care Provider: Jac Canavan, PA-C  Primary Cardiologist: Parke Poisson, MD   Discharge Diagnoses    Principal Problem:   Coronary vasospasm [facility]) Active Problems:   Essential hypertension, benign   Obesity, unspecified   NSTEMI (non-ST elevated myocardial infarction) (HCC)  Allergies Allergies  Allergen Reactions  . Penicillins Anaphylaxis  . Latex   . Sulfa Antibiotics Hives  . Ceftin [Cefuroxime Axetil] Rash    Tolerates Keflex  . Doxycycline Rash  . Other Rash    ALL MYCINS   Diagnostic Studies/Procedures    Cardiac catheterization 11/11/2018:  Conclusions: 1. Mild luminal narrowing of the ostial LAD and distal LCx that improves with intracoronary nitroglycerin, suggestive of coronary vasospasm.  No severe stenosis noted to account for chest pain at rest and mild troponin elevation. 2. Normal left ventricular systolic function with mildly elevated filling pressure suggestive of diastolic dysfunction.  Recommendations: 1. Stop nitroglycerin infusion and start isosorbide mononitrate for prevention of vasospasm. 2. Consider evaluation for alternate causes of chest pain and mild troponin elevation, including pulmonary embolism. 3. Secondary prevention of coronary artery disease. _____________   Echocardiogram 11/10/2018: Study Conclusions  - Left ventricle: The cavity size was normal. Wall thickness was   increased in a pattern of mild LVH. Systolic function was normal.   The estimated ejection fraction was in the range of 55% to 60%.   Wall motion was normal; there were no regional wall motion   abnormalities. Left ventricular diastolic function parameters   were normal.  Impressions:  - Normal LV function; mild LVH; mild TR.  History of Present Illness     Lindsay [name] is a 57 y.o. female with a  history of hypertension, obesity, allergic rhinitis and former smoking who presented to [facility] on 11/09/18 with complaints of pressure-like chest pain. The pain woke her up from sleep at around 8 AM.  She stated that it was squeezing in nature and radiated to the left arm (that also felt like it was asleep).  She felt hot and was diaphoretic. She denied any nausea or vomiting.  She had difficulty catching her breath but did not feel short of breath overall. She denied any orthopnea or paroxysmal nocturnal dyspnea. She has chronic lower extremity edema for which she takes hydrochlorothiazide. The chest pain waxed and waned until the afternoon when she went to a funeral. She reported that the pain then got more intense and started radiating to her shoulders and the back.  She got more concerned about her symptoms and eventually presented to the hospital.  In the ED the ECG revealed normal sinus rhythm, rate 73 bpm, left axis deviation, poor R wave progression, and nonspecific T wave flattening.  The troponin was mildly elevated at 0.30.  She received 324 mg of aspirin, sublingual nitroglycerin and IV unfractionated heparin. Given the above plan was for cardiac catheterization.  Hospital Course   She was taken to the Cath Lab on 11/11/2018 which showed mild luminal narrowing of the ostial LAD and distal LCx which improved with intracoronary nitroglycerin suggestive of coronary vasospasm.  There was no severe stenosis noted to account for chest pain at rest and mild troponin elevation.  Recommendations were to stop NTG infusion and start isosorbide mononitrate for prevention of vasospasm consideration for the evaluation of alternative causes of chest pain and mild troponin elevation including pulmonary embolism  with secondary prevention of CAD. Plans were to continue ASA and atorvastatin as well as Imdur at 30 mg daily.  A chest CT to rule out PE study was performed which was negative for PE.  She had complications  with right arm swelling/hematoma status post right radial cath in which an ultrasound of the right arm was performed which showed no evidence of hematoma or fluid collection.  She had recurrent chest pain on 11/13/2018.  She was given 1 sublingual nitroglycerin which improved her pain.  EKG performed was unremarkable for ischemia.  Given this, amlodipine 2.5 mg was added to her regimen.  On day of discharge she had no complaints of chest pain but did had complaints of headache secondary to Imdur.  Her dose was reduced to 15 mg daily to allow for adjustment and can resume 30 mg as tolerated in the outpatient setting.  It was also discussed initiating L arginine as an outpatient if we have difficulty controlling her coronary spasm chest pain with Imdur and amlodipine alone, or if we have difficulty titrating these medications due to headache or low blood pressure.  We can also consider diltiazem as an alternate therapy.  If she has recurrent chest pain while at home she should take nitroglycerin.  If she does not have relief of chest pain with nitroglycerin she should consider presentation to the ED and we have discussed this today.  She did have elevated troponin with her initial presentation of chest pain, and therefore it should not be taken lightly.   Consultants: None  The patient has been seen and examined by Dr. Jacques Navy who feels that she is stable and ready for discharge today, 11/14/2018.  Cath site unremarkable with ecchymosis.  Follow-up appointments been made with 4 11/27/2018 at 2:40 PM ____________  Discharge Vitals Blood pressure 137/85, pulse 60, temperature 97.9 F (36.6 C), temperature source Oral, resp. rate 20, height 5' 7.5" (1.715 m), weight 127.3 kg, SpO2 100 %.  Filed Weights   11/12/18 0500 11/13/18 0432 11/14/18 0512  Weight: 129.3 kg 127.4 kg 127.3 kg   Labs & Radiologic Studies    CBC Recent Labs    11/13/18 0322 11/14/18 0335  WBC 4.4 5.2  HGB 10.4* 10.2*  HCT  34.3* 34.0*  MCV 78.0* 78.5*  PLT 230 221   Basic Metabolic Panel Recent Labs    16/10/96 1006  NA 138  K 3.5  CL 106  CO2 24  GLUCOSE 124*  BUN 7  CREATININE 0.85  CALCIUM 8.3*  _____________  Dg Chest 2 View  Result Date: 11/09/2018 CLINICAL DATA:  Central chest pain with shortness of breath since this morning. EXAM: CHEST - 2 VIEW COMPARISON:  12/21/2014 FINDINGS: Lungs are adequately inflated without focal airspace consolidation or effusion. Cardiomediastinal silhouette and remainder of the exam is unchanged. IMPRESSION: No active cardiopulmonary disease. Electronically Signed   By: Elberta Fortis M.D.   On: 11/09/2018 17:41   Ct Angio Chest Pe W Or Wo Contrast  Result Date: 11/12/2018 CLINICAL DATA:  Chest pain. EXAM: CT ANGIOGRAPHY CHEST WITH CONTRAST TECHNIQUE: Multidetector CT imaging of the chest was performed using the standard protocol during bolus administration of intravenous contrast. Multiplanar CT image reconstructions and MIPs were obtained to evaluate the vascular anatomy. CONTRAST:  80 mL ISOVUE-370 IOPAMIDOL (ISOVUE-370) INJECTION 76% COMPARISON:  Radiographs of November 09, 2018. FINDINGS: Cardiovascular: Satisfactory opacification of the pulmonary arteries to the segmental level. No evidence of pulmonary embolism. Normal heart size. No pericardial effusion. Mediastinum/Nodes:  Large hiatal hernia is noted. Thyroid gland is unremarkable. No adenopathy is noted. Lungs/Pleura: Lungs are clear. No pleural effusion or pneumothorax. Upper Abdomen: No acute abnormality. Musculoskeletal: No chest wall abnormality. No acute or significant osseous findings. Review of the MIP images confirms the above findings. IMPRESSION: No definite evidence of pulmonary embolus. Large sliding-type hiatal hernia. No other abnormality seen in the chest. Electronically Signed   By: Lupita RaiderJames  Green Jr, M.D.   On: 11/12/2018 10:20   [place] Rt Upper Extrem Ltd Soft Tissue Non Vascular  Result Date:  11/12/2018 CLINICAL DATA:  Heart catheterization yesterday with right radial access. Evaluate for hematoma. EXAM: ULTRASOUND RIGHT UPPER EXTREMITY LIMITED TECHNIQUE: Ultrasound examination of the upper extremity soft tissues was performed in the area of clinical concern. COMPARISON:  None. FINDINGS: Focused ultrasound of the distal right forearm near the radial artery demonstrates no discrete fluid collection or hematoma. No soft tissue mass. IMPRESSION: No hematoma or fluid collection visualized. Electronically Signed   By: Obie DredgeWilliam T Derry M.D.   On: 11/12/2018 12:46   Disposition   Pt is being discharged home today in good condition.  Follow-up Plans & Appointments    Follow-up Information    Parke PoissonAcharya, Gayatri A, MD Follow up on 11/27/2018.   Specialty:  Cardiology Why:  Your follow-up appointment will be on 11/27/2018 at 2:40 PM Contact information: [address] [place] [address] [phone]          Discharge Instructions    Call MD for:  difficulty breathing, headache or visual disturbances   Complete by:  As directed    Call MD for:  extreme fatigue   Complete by:  As directed    Call MD for:  hives   Complete by:  As directed    Call MD for:  persistant dizziness or light-headedness   Complete by:  As directed    Call MD for:  persistant nausea and vomiting   Complete by:  As directed    Call MD for:  redness, tenderness, or signs of infection (pain, swelling, redness, odor or green/yellow discharge around incision site)   Complete by:  As directed    Call MD for:  severe uncontrolled pain   Complete by:  As directed    Call MD for:  temperature >100.4   Complete by:  As directed    Diet - low sodium heart healthy   Complete by:  As directed    Discharge instructions   Complete by:  As directed    No driving for 3 days. No lifting over 5 lbs for 1 week. No sexual activity for 1 week. Keep procedure site clean & dry. If you notice increased pain,  swelling, bleeding or pus, call/return!  You may shower, but no soaking baths/hot tubs/pools for 1 week.   If you notice any bleeding such as blood in stool, black tarry stools, blood in urine, nosebleeds or any other unusual bleeding, call your doctor immediately.   Increase activity slowly   Complete by:  As directed      Discharge Medications   Allergies as of 11/14/2018      Reactions   Penicillins Anaphylaxis   Latex    Sulfa Antibiotics Hives   Ceftin [cefuroxime Axetil] Rash   Tolerates Keflex   Doxycycline Rash   Other Rash   ALL MYCINS      Medication List    TAKE these medications   amLODipine 2.5 MG tablet Commonly known as:  NORVASC  Take 1 tablet (2.5 mg total) by mouth daily. Start taking on:  11/15/2018   aspirin 81 MG EC tablet Take 1 tablet (81 mg total) by mouth daily. Start taking on:  11/15/2018   atorvastatin 80 MG tablet Commonly known as:  LIPITOR Take 1 tablet (80 mg total) by mouth daily at 6 PM.   esomeprazole 20 MG capsule Commonly known as:  NEXIUM Take 20 mg by mouth daily at 12 noon.   hydrochlorothiazide 25 MG tablet Commonly known as:  HYDRODIURIL Take 25 mg by mouth daily.   isosorbide mononitrate 30 MG 24 hr tablet Commonly known as:  IMDUR Take 0.5 tablets (15 mg total) by mouth daily. Start taking on:  11/15/2018   multivitamin tablet Take 1 tablet by mouth daily.       Acute coronary syndrome (MI, NSTEMI, STEMI, etc) this admission?:  No.  The elevated Troponin was due to the acute medical illness or demand ischemia.    Outstanding Labs/Studies   BMET   Duration of Discharge Encounter   Greater than 30 minutes including physician time.  Signed, Georgie Chard, NP 11/14/2018, 1:34 PM

## 2018-11-14 NOTE — Progress Notes (Signed)
Progress Note  Patient Name: Lindsay [name] Date of Encounter: 11/14/2018  Primary Cardiologist: Dr. Jacques Navy  Subjective   Has nitroglycerin headache, but no chest pain overnight.  Inpatient Medications    Scheduled Meds: . [START ON 11/15/2018] amLODipine  2.5 mg Oral Daily  . aspirin EC  81 mg Oral Daily  . atorvastatin  80 mg Oral q1800  . enoxaparin (LOVENOX) injection  40 mg Subcutaneous Q24H  . [START ON 11/15/2018] isosorbide mononitrate  15 mg Oral Daily  . sodium chloride flush  3 mL Intravenous Q12H   Continuous Infusions: . sodium chloride     PRN Meds: sodium chloride, acetaminophen, HYDROcodone-acetaminophen, ondansetron (ZOFRAN) IV, sodium chloride flush   Vital Signs    Vitals:   11/13/18 2038 11/14/18 0512 11/14/18 0514 11/14/18 0841  BP: 124/77  119/74 137/85  Pulse: 65  60   Resp:      Temp: 97.9 F (36.6 C)     TempSrc: Oral     SpO2: 98%  100%   Weight:  127.3 kg    Height:        Intake/Output Summary (Last 24 hours) at 11/14/2018 1208 Last data filed at 11/14/2018 0847 Gross per 24 hour  Intake 180 ml  Output -  Net 180 ml   Filed Weights   11/12/18 0500 11/13/18 0432 11/14/18 0512  Weight: 129.3 kg 127.4 kg 127.3 kg    Telemetry    nsr - Personally Reviewed  ECG    No new - Personally Reviewed  Physical Exam   GEN: No acute distress.   Neck: No JVD Cardiac: regular rhythm, normal rate, no murmurs, rubs, or gallops.  Respiratory: Clear to auscultation bilaterally. GI: Soft, nontender, non-distended  MS: No edema; No deformity. Neuro:  Nonfocal  Psych: Normal affect   Labs    Chemistry Recent Labs  Lab 11/10/18 0418 11/11/18 0346 11/12/18 1006  NA 138 140 138  K 3.4* 4.1 3.5  CL 104 104 106  CO2 25 33* 24  GLUCOSE 133* 131* 124*  BUN 18 13 7   CREATININE 0.76 0.84 0.85  CALCIUM 8.7* 8.4* 8.3*  GFRNONAA >60 >60 >60  GFRAA >60 >60 >60  ANIONGAP 9 3* 8     Hematology Recent Labs  Lab 11/12/18 0416  11/13/18 0322 11/14/18 0335  WBC 4.4 4.4 5.2  RBC 4.47 4.40 4.33  HGB 10.4* 10.4* 10.2*  HCT 35.1* 34.3* 34.0*  MCV 78.5* 78.0* 78.5*  MCH 23.3* 23.6* 23.6*  MCHC 29.6* 30.3 30.0  RDW 16.1* 16.1* 16.3*  PLT 246 230 221    Cardiac Enzymes Recent Labs  Lab 11/09/18 1700 11/10/18 0942 11/10/18 1457 11/10/18 2137  TROPONINI 0.30* 0.37* 0.29* 0.24*   No results for input(s): TROPIPOC in the last 168 hours.   BNPNo results for input(s): BNP, PROBNP in the last 168 hours.   DDimer No results for input(s): DDIMER in the last 168 hours.   Radiology    No results found.  Cardiac Studies   No new  Patient Profile     Assessment & Plan   Principal Problem:   NSTEMI (non-ST elevated myocardial infarction) [facility]) Active Problems:   Essential hypertension, benign   She had no chest pain overnight and can be dismissed home.  She likely has coronary spasm and we will treat this with Imdur and amlodipine.  Unfortunately she gets a severe nitroglycerin headache with Imdur 30 mg daily.  My plan is to have her take 15 mg  daily which did not cause a significant headache, and allow her to get adjusted to this medication.  I will see her back in 2 weeks to assess medication titration.  We will also continue amlodipine 2.5 mg daily which is a new medication intended for coronary spasm.    We have also discussed initiating L arginine as an outpatient if we have difficulty controlling her coronary spasm chest pain with Imdur and amlodipine alone, or if we have difficulty titrating these medications due to headache or low blood pressure.  We can also consider diltiazem as an alternate therapy.  If she has recurrent chest pain while at home she should take nitroglycerin.  If she does not have relief of chest pain with nitroglycerin she should consider presentation to the ED and we have discussed this today.  She did have elevated troponin with her initial presentation of chest pain, and therefore  it should not be taken lightly.  For questions or updates, please contact [facility] Please consult [email or web address] for contact info under        Signed, Parke PoissonGayatri A Acharya, MD  11/14/2018, 12:08 PM

## 2018-11-14 NOTE — Care Management Note (Signed)
Case Management Note  Patient Details  Name: Lindsay [name] MRN: [id number] Date of Birth: Jul 31, 1961  Subjective/Objective:   Pt admitted with NSTEMI                 Action/Plan:   PTA independent from home with husband.  Pt informed CM that she has a PCP and denied barriers with paying/obtaining medications as prescribed.  CM will continue to follow for discharge needs   Expected Discharge Date:  11/14/18               Expected Discharge Plan:  Home/Self Care(From home with husband)  In-House Referral:     Discharge planning Services     Post Acute Care Choice:    Choice offered to:     DME Arranged:    DME Agency:     HH Arranged:    HH Agency:     Status of Service:  In process, will continue to follow  If discussed at Long Length of Stay Meetings, dates discussed:    Additional Comments:  Lindsay [name], Lindsay Vahle S, RN 11/14/2018, 4:08 PM

## 2018-11-27 ENCOUNTER — Encounter: Payer: Self-pay | Admitting: Internal Medicine

## 2018-11-27 ENCOUNTER — Ambulatory Visit (INDEPENDENT_AMBULATORY_CARE_PROVIDER_SITE_OTHER): Payer: BLUE CROSS/BLUE SHIELD | Admitting: Internal Medicine

## 2018-11-27 VITALS — BP 110/70 | HR 81 | Ht 67.5 in | Wt 279.8 lb

## 2018-11-27 DIAGNOSIS — I10 Essential (primary) hypertension: Secondary | ICD-10-CM

## 2018-11-27 DIAGNOSIS — I214 Non-ST elevation (NSTEMI) myocardial infarction: Secondary | ICD-10-CM

## 2018-11-27 DIAGNOSIS — I201 Angina pectoris with documented spasm: Secondary | ICD-10-CM

## 2018-11-27 MED ORDER — AMLODIPINE BESYLATE 5 MG PO TABS: 5.0000 mg | ORAL_TABLET | Freq: Every day | ORAL | 2 refills | Status: DC

## 2018-11-27 NOTE — Patient Instructions (Signed)
Medication Instructions:  Your physician has recommended you make the following change in your medication: INCREASE Amlodipine to 5mg  daily  If you need a refill on your cardiac medications before your next appointment, please call your pharmacy.   Lab work: None ordered If you have labs (blood work) drawn today and your tests are completely normal, you will receive your results only by: Marland Kitchen. MyChart Message (if you have MyChart) OR . A paper copy in the mail If you have any lab test that is abnormal or we need to change your treatment, we will call you to review the results.  Testing/Procedures: None ordered  Follow-Up: At [facility], you and your health needs are our priority.  As part of our continuing mission to provide you with exceptional heart care, we have created designated Provider Care Teams.  These Care Teams include your primary Cardiologist (physician) and Advanced Practice Providers (APPs -  Physician Assistants and Nurse Practitioners) who all work together to provide you with the care you need, when you need it. You will need a follow up appointment in 3 months.    You may see Parke PoissonGayatri A Acharya, MD or one of the following Advanced Practice Providers on your designated Care Team:   Theodore DemarkRhonda Barrett, PA-C . Joni ReiningKathryn Lawrence, DNP, ANP  Any Other Special Instructions Will Be Listed Below (If Applicable).

## 2018-11-28 ENCOUNTER — Telehealth: Payer: Self-pay | Admitting: Internal Medicine

## 2018-11-28 NOTE — Telephone Encounter (Signed)
Will forward to [facility] with Dr Jacques NavyAcharya RN

## 2018-11-28 NOTE — Telephone Encounter (Signed)
Returned pt call. Pt sts that after discussing a ref to GI for hernia repair at her o/v yesterday with Dr.Achraya, she contacted [facility] and was told that even though her insurance company doesn't require a ref. She would need to be seen there and treated before the could ref to a Careers advisersurgeon. Pt wanted Dr.Acharya to make the ref. Adv pt that a ref for surgery that is not cardiac related should come from your pcp. Pt sts that she would contact her pcpc office and voiced appreciation for the call back.

## 2018-11-28 NOTE — Telephone Encounter (Signed)
Patient discussed surgical options with Dr Jacques NavyAcharya at yesterday's visit and would like to talk to her about scheduling procedure

## 2018-12-01 ENCOUNTER — Other Ambulatory Visit (HOSPITAL_COMMUNITY): Payer: Self-pay | Admitting: Cardiology

## 2018-12-01 NOTE — Progress Notes (Signed)
Cardiology Office Note:    Date:  12/01/2018   ID:  Lindsay BilesPamela H [name], DOB 23-Aug-1961, MRN [id number]  PCP:  Irena Reichmannollins, Dana, DO  Cardiologist:  Parke PoissonGayatri A Bernhardt Riemenschneider, MD  Electrophysiologist:  None   Referring MD: Jac Canavanysinger, David S, PA-C   Chest pain  History of Present Illness:    Lindsay [name] is a 57 y.o. female with a hx of GERD, and a recent admission for chest pain where she was found to have no obstructive coronary artery disease and was ruled out for pulmonary embolism.  Diagnosis of coronary artery spasm was made based on response to intracoronary nitroglycerin in the Cath Lab.  The patient continued to have some chest pain during her hospitalization which was responsive to sublingual nitroglycerin.  We had initiated Imdur however she was unable to tolerate more than 15 to 30 mg daily due to significant headache.  We had also initiated amlodipine as an additional therapy with regard to coronary spasm.  She tells me that she continues to have episodes of chest pain at home and self uptitrated her Imdur from 15 mg daily to 30 mg daily.  This is reasonable given that this was our goal in hospital and was not performed due to significant headache.  She also has a recently identified large hiatal hernia from a CT scan obtained for pulmonary embolism.  While her chest pain is not similar to her GERD discomfort, she is concerned that perhaps it is the hernia leading to her pain.  She also notes that more recently she has become quite dyspneic with exertion.  She walked from her car to a store and felt quite limited due to shortness of breath.  This is unusual for her given that she is usually quite active, and cares for her husband as well as her mother.  The patient denies palpitations, PND, orthopnea, or leg swelling. Denies syncope or presyncope. Denies dizziness or lightheadedness.   Past Medical History:  Diagnosis Date  . Allergic rhinitis, cause unspecified    seasonal  . Essential  hypertension, benign 2013  . GERD (gastroesophageal reflux disease)   . Joint pain   . Multiple drug allergies   . Obesity   . Symptomatic menopausal or female climacteric states   . Wears glasses   . Wears partial dentures    upper    Past Surgical History:  Procedure Laterality Date  . COLONOSCOPY  2008   [facility]  . LEFT HEART CATH AND CORONARY ANGIOGRAPHY N/A 11/11/2018   Procedure: LEFT HEART CATH AND CORONARY ANGIOGRAPHY;  Surgeon: Yvonne KendallEnd, Christopher, MD;  [facility];  Service: Cardiovascular;  Laterality: N/A;  . left knee surgery     arthroscopy  . ROTATOR CUFF REPAIR     right shoulder  . tonsils removed    . TUBAL LIGATION  02-1989  . WISDOM TOOTH EXTRACTION      Current Medications: No outpatient medications have been marked as taking for the 11/27/18 encounter (Office Visit) with Parke PoissonAcharya, Harriett Azar A, MD.     Allergies:   Penicillins; Latex; Sulfa antibiotics; Ceftin [cefuroxime axetil]; Doxycycline; and Other   Social History   Socioeconomic History  . Marital status: Single    Spouse name: Not on file  . Number of children: 2  . Years of education: Not on file  . Highest education level: Not on file  Occupational History  . Occupation: Network engineerBilling and Insurance Coord.  Social Needs  . Financial resource strain: Not  on file  . Food insecurity:    Worry: Not on file    Inability: Not on file  . Transportation needs:    Medical: Not on file    Non-medical: Not on file  Tobacco Use  . Smoking status: Former Smoker    Packs/day: 0.30    Years: 20.00    Pack years: 6.00    Types: Cigarettes    Last attempt to quit: 12/11/2010    Years since quitting: 7.9  . Smokeless tobacco: Never Used  Substance and Sexual Activity  . Alcohol use: Yes    Comment: socially  . Drug use: No  . Sexual activity: Not on file  Lifestyle  . Physical activity:    Days per week: Not on file    Minutes per session: Not on file  . Stress: Not on file    Relationships  . Social connections:    Talks on phone: Not on file    Gets together: Not on file    Attends religious service: Not on file    Active member of club or organization: Not on file    Attends meetings of clubs or organizations: Not on file    Relationship status: Not on file  Other Topics Concern  . 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     Family History: The patient's family history includes Asthma in her maternal aunt; Cancer (age of onset: 36) in her mother; Heart disease (age of onset: 71) in her father; Hypertension in her father. There is no history of Diabetes or Stroke.  ROS:   Please see the history of present illness.    All other systems reviewed and are negative.  EKGs/Labs/Other Studies Reviewed:    The following studies were reviewed today:  EKG: Normal sinus rhythm, ventricular rate 81 bpm  Recent Labs: 11/12/2018: BUN 7; Creatinine, Ser 0.85; Potassium 3.5; Sodium 138 11/14/2018: Hemoglobin 10.2; Platelets 221  Recent Lipid Panel    Component Value Date/Time   CHOL 183 11/10/2018 0418   TRIG 74 11/10/2018 0418   HDL 72 11/10/2018 0418   CHOLHDL 2.5 11/10/2018 0418   VLDL 15 11/10/2018 0418   LDLCALC 96 11/10/2018 0418    Physical Exam:    VS:  BP 110/70   Pulse 81   Ht 5' 7.5" (1.715 m)   Wt 279 lb 12.8 oz (126.9 kg)   BMI 43.18 kg/m     Wt Readings from Last 3 Encounters:  11/27/18 279 lb 12.8 oz (126.9 kg)  11/14/18 280 lb 11.2 oz (127.3 kg)  01/26/14 268 lb (121.6 kg)    Constitutional: No acute distress Eyes: pupils equally round and reactive to light, sclera non-icteric, normal conjunctiva and lids ENMT: normal dentition, moist mucous membranes Cardiovascular: regular rhythm, normal rate, no murmurs. S1 and S2 normal. Radial pulses normal bilaterally. No jugular venous distention.  Respiratory:  clear to auscultation bilaterally GI : normal bowel sounds, soft and nontender. No distention.   MSK: extremities warm, well perfused. No edema.  NEURO: grossly nonfocal exam, moves all extremities. PSYCH: alert and oriented x 3, normal mood and affect.   ASSESSMENT:    1. Coronary vasospasm (HCC)   2. Essential hypertension, benign   3. NSTEMI (non-ST elevated myocardial infarction) [facility])    PLAN:    It is likely she is experiencing coronary vasospasm from microvascular  dysfunction.  She is nitro responsive, but does have a significant headache with nitro.  We will uptitrate her amlodipine, and I would like to speak to her in 2 weeks time to ensure that we can uptitrate her amlodipine to 10 mg as a total daily dose for best effect.  We have discussed the use of L-arginine in microvascular dysfunction, and we will consider this going forward.  She is primarily concerned about her hiatal hernia and getting that evaluated.  I think that is very reasonable.  We cannot entirely exclude esophageal spasm as the etiology of her pain, especially as it is nitro responsive.  She did have a mild troponin elevation in her hospitalization, so I have instructed her not to take her chest pain lightly.  She should feel free to contact us at anytime with concerns of chest pain.  She has been instructed on how to take nitroglycerin.  I will see her back in 3 months time or sooner as needed.  Medication Adjustments/Labs and Tests Ordered: Current medicines are reviewed at length with the patient today.  Concerns regarding medicines are outlined above.  Orders Placed This Encounter  Procedures  . EKG 12-Lead   Meds ordered this encounter  Medications  . amLODipine (NORVASC) 5 MG tablet    Sig: Take 1 tablet (5 mg total) by mouth daily.    Dispense:  90 tablet    Refill:  2    Dosage increase    Patient Instructions  Medication Instructions:  Your physician has recommended you make the following change  in your medication: INCREASE Amlodipine to 5mg  daily  If you need a refill on your cardiac medications before your next appointment, please call your pharmacy.   Lab work: None ordered If you have labs (blood work) drawn today and your tests are completely normal, you will receive your results only by: Marland Kitchen. MyChart Message (if you have MyChart) OR . A paper copy in the mail If you have any lab test that is abnormal or we need to change your treatment, we will call you to review the results.  Testing/Procedures: None ordered  Follow-Up: At [facility], you and your health needs are our priority.  As part of our continuing mission to provide you with exceptional heart care, we have created designated Provider Care Teams.  These Care Teams include your primary Cardiologist (physician) and Advanced Practice Providers (APPs -  Physician Assistants and Nurse Practitioners) who all work together to provide you with the care you need, when you need it. You will need a follow up appointment in 3 months.    You may see Parke PoissonGayatri A Yandell Mcjunkins, MD or one of the following Advanced Practice Providers on your designated Care Team:   Theodore DemarkRhonda Barrett, PA-C . Joni ReiningKathryn Lawrence, DNP, ANP  Any Other Special Instructions Will Be Listed Below (If Applicable).       Signed, Parke PoissonGayatri A Jenell Dobransky, MD  12/01/2018 5:23 PM    [facility]

## 2018-12-16 ENCOUNTER — Ambulatory Visit: Payer: BLUE CROSS/BLUE SHIELD | Admitting: Physician Assistant

## 2019-01-23 ENCOUNTER — Ambulatory Visit (HOSPITAL_COMMUNITY)
Admission: EM | Admit: 2019-01-23 | Discharge: 2019-01-23 | Disposition: A | Discharge status: Home / Self Care | Payer: BLUE CROSS/BLUE SHIELD | Attending: Family Medicine | Admitting: Family Medicine

## 2019-01-23 ENCOUNTER — Encounter (HOSPITAL_COMMUNITY): Payer: Self-pay | Admitting: Emergency Medicine

## 2019-01-23 DIAGNOSIS — S161XXA Strain of muscle, fascia and tendon at neck level, initial encounter: Secondary | ICD-10-CM | POA: Diagnosis not present

## 2019-01-23 MED ORDER — TIZANIDINE HCL 4 MG PO TABS: 4.0000 mg | ORAL_TABLET | Freq: Four times a day (QID) | ORAL | 0 refills | Status: DC | PRN

## 2019-01-23 MED ORDER — IBUPROFEN 800 MG PO TABS: 800.0000 mg | ORAL_TABLET | Freq: Three times a day (TID) | ORAL | 0 refills | Status: DC

## 2019-01-23 NOTE — Discharge Instructions (Addendum)
Use ice to painful neck muscles today.  After today alternate ice and heat Activity as tolerated Take ibuprofen 3 times a day with food Take muscle relaxer as needed for spasm.  This is helpful at night Off work for 3 to 4 days See your primary care doctor if unable to return to work by Monday Return at anytime if worse instead of better
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
## 2019-02-11 ENCOUNTER — Telehealth: Payer: Self-pay | Admitting: Internal Medicine

## 2019-02-11 NOTE — Telephone Encounter (Signed)
Spoke to patient Lindsay [name]'s advice given.Advised to keep appointment with Dr.Acharya as planned to discuss at office visit.

## 2019-02-11 NOTE — Telephone Encounter (Signed)
Atorvastatin not commonly known to cause swelling but may cause muscle pain or discomfort.   Recommendation:  1. HOLD atorvastatin for 1 week, then resume at 1/2 tablet (40mg ) daily.  2. Keep appointment with DR Jacques Navy , as previously scheduled and discuss potential change of therapy if symptoms are not completely resolved.

## 2019-02-11 NOTE — Telephone Encounter (Signed)
° °  Pt c/o swelling: STAT is pt has developed SOB within 24 hours  1) How much weight have you gained and in what time span? N/A  2) If swelling, where is the swelling located? LEGS  3) Are you currently taking a fluid pill? YES  4) Are you currently SOB? NO  5) Do you have a log of your daily weights (if so, list)? NO  6) Have you gained 3 pounds in a day or 5 pounds in a week? N/A  7) Have you traveled recently? NO

## 2019-02-11 NOTE — Telephone Encounter (Signed)
Returned call to patient she stated since atorvastatin increased to 80 mg daily she has been having swelling in lower legs and feet.Advised I will send message to pharmacy for advice.

## 2019-03-06 ENCOUNTER — Telehealth: Payer: Self-pay

## 2019-03-06 NOTE — Telephone Encounter (Signed)
   Cardiac Questionnaire:    Since your last visit or hospitalization:    1. Have you been having new or worsening chest pain? NO   2. Have you been having new or worsening shortness of breath? NO 3. Have you been having new or worsening leg swelling, wt gain, or increase in abdominal girth (pants fitting more tightly)? NO   4. Have you had any passing out spells? NO   Primary Cardiologist:  Parke Poisson, MD   Patient contacted.  History reviewed.  No symptoms to suggest any unstable cardiac conditions.  Based on discussion, with current pandemic situation, we will be postponing this appointment for [facility] with a plan for f/u in 12 wks or sooner if feasible/necessary.  If symptoms change, she has been instructed to contact our office.    Adline Peals, CMA  03/06/2019 12:15 PM         .    .

## 2019-03-10 ENCOUNTER — Ambulatory Visit: Payer: BLUE CROSS/BLUE SHIELD | Admitting: Internal Medicine

## 2019-03-26 ENCOUNTER — Telehealth: Payer: Self-pay | Admitting: Internal Medicine
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
## 2019-04-14 ENCOUNTER — Telehealth (INDEPENDENT_AMBULATORY_CARE_PROVIDER_SITE_OTHER): Payer: BLUE CROSS/BLUE SHIELD | Admitting: Internal Medicine

## 2019-04-14 ENCOUNTER — Encounter: Payer: Self-pay | Admitting: Internal Medicine

## 2019-04-14 ENCOUNTER — Telehealth: Payer: Self-pay | Admitting: *Deleted

## 2019-04-14 VITALS — BP 126/76 | HR 76 | Ht 67.5 in | Wt 271.5 lb

## 2019-04-14 DIAGNOSIS — K449 Diaphragmatic hernia without obstruction or gangrene: Secondary | ICD-10-CM | POA: Diagnosis not present

## 2019-04-14 DIAGNOSIS — I10 Essential (primary) hypertension: Secondary | ICD-10-CM

## 2019-04-14 DIAGNOSIS — I214 Non-ST elevation (NSTEMI) myocardial infarction: Secondary | ICD-10-CM

## 2019-04-14 DIAGNOSIS — I201 Angina pectoris with documented spasm: Secondary | ICD-10-CM

## 2019-04-14 NOTE — Telephone Encounter (Signed)
Spoke to patient instruction given . avs summary  will be mailed to patient  Patient voiced understading

## 2019-04-14 NOTE — Patient Instructions (Addendum)
Medication Instructions:  NO CHANGES If you need a refill on your cardiac medications before your next appointment, please call your pharmacy.   Lab work: NO CHANGES If you have labs (blood work) drawn today and your tests are completely normal, you will receive your results only by: Marland Kitchen MyChart Message (if you have MyChart) OR . A paper copy in the mail If you have any lab test that is abnormal or we need to change your treatment, we will call you to review the results.  Testing/Procedures: NO CHANGES  Follow-Up: At [facility], you and your health needs are our priority.  As part of our continuing mission to provide you with exceptional heart care, we have created designated Provider Care Teams.  These Care Teams include your primary Cardiologist (physician) and Advanced Practice Providers (APPs -  Physician Assistants and Nurse Practitioners) who all work together to provide you with the care you need, when you need it. You will need a follow up appointment in  3  months.  Please call our office 2 months in advance to schedule this appointment.  You may see Parke Poisson, MD  or one of the following Advanced Practice Providers on your designated Care Team:   Theodore Demark, PA-C . Joni Reining, DNP, ANP  Any Other Special Instructions Will Be Listed Below (If Applicable).

## 2019-04-14 NOTE — Progress Notes (Signed)
Virtual Visit via Audio/Video Note   This visit type was conducted due to national recommendations for restrictions regarding the COVID-19 Pandemic (e.g. social distancing) in an effort to limit this patient's exposure and mitigate transmission in our community.  Due to her co-morbid illnesses, this patient is at least at moderate risk for complications without adequate follow up.  This format is felt to be most appropriate for this patient at this time.  All issues noted in this document were discussed and addressed.  A limited physical exam was performed with this format.  Please refer to the patient's chart for her consent to telehealth for [facility]. Video visit was initially attempted, but required conversion to telephone due to audio difficulties.   Date:  04/14/2019   ID:  Lindsay Sahaamela H Demelo, DOB 05-Jul-1961, MRN [id number]  Patient Location: Home Provider Location: Home  PCP:  Irena Reichmannollins, Dana, DO  Cardiologist:  Parke PoissonGayatri A Jennings Stirling, MD  Electrophysiologist:  None   Evaluation Performed:  Follow-Up Visit  Chief Complaint:  Chest pain, probable coronary spasm.  History of Present Illness:    Lindsay [name] is a 58 y.o. female with hx of GERD, and chest pain with no obstructive coronary artery disease, ruled out for pulmonary embolism.  Diagnosis of coronary artery spasm was made based on response to intracoronary nitroglycerin in the Cath Lab.  The patient continued to have some chest pain during her hospitalization which was responsive to sublingual nitroglycerin.  We had initiated Imdur however she was unable to tolerate more than 15 to 30 mg daily due to significant headache.  We had also initiated amlodipine as an additional therapy with regard to coronary spasm. No chest pain or tightness, especially since starting the lasix.   At our last visit in December, she had self uptitrated her Imdur from 15 mg daily to 30 mg daily.  This is reasonable given that this was our goal in hospital  and was not performed due to significant headache.  She also has a recently identified large hiatal hernia from a CT scan obtained for pulmonary embolism.  While her chest pain is not similar to her GERD discomfort, we had discussed following this up with GI for possible contribution to symptoms. She discussed this with her gastroenterologist who recommended a conservative approach and follow up in the summer. They discussed weight loss and lifestyle modification, which she is pursuing.   We also followed up her previously mentioned symptom of dyspnea with exertion.  She previously noted that she walked from her car to a store and felt quite limited due to shortness of breath. Started lasix with primary doctor, helps with SOB, orthopnea, lower extremity swelling, and chest pain. Will go for lab work next week as directed by PCP.  The patient denies palpitations, PND. Denies syncope or presyncope. Denies dizziness or lightheadedness.  The patient does not have symptoms concerning for COVID-19 infection (fever, chills, cough, or new shortness of breath).    Past Medical History:  Diagnosis Date  . Allergic rhinitis, cause unspecified    seasonal  . Essential hypertension, benign 2013  . GERD (gastroesophageal reflux disease)   . Joint pain   . Multiple drug allergies   . Obesity   . Symptomatic menopausal or female climacteric states   . Wears glasses   . Wears partial dentures    upper   Past Surgical History:  Procedure Laterality Date  . COLONOSCOPY  2008   [facility]  . LEFT HEART CATH  AND CORONARY ANGIOGRAPHY N/A 11/11/2018   Procedure: LEFT HEART CATH AND CORONARY ANGIOGRAPHY;  Surgeon: Yvonne Kendall, MD;  [facility];  Service: Cardiovascular;  Laterality: N/A;  . left knee surgery     arthroscopy  . ROTATOR CUFF REPAIR     right shoulder  . tonsils removed    . TUBAL LIGATION  02-1989  . WISDOM TOOTH EXTRACTION       Current Meds  Medication Sig   . amLODipine (NORVASC) 5 MG tablet Take 1 tablet (5 mg total) by mouth daily.  Marland Kitchen aspirin EC 81 MG EC tablet Take 1 tablet (81 mg total) by mouth daily.  Marland Kitchen atorvastatin (LIPITOR) 80 MG tablet Take 1/2 tablet ( 40 mg ) daily  . esomeprazole (NEXIUM) 20 MG capsule Take 20 mg by mouth daily at 12 noon.  . furosemide (LASIX) 40 MG tablet Take 120 mg by mouth daily. Take  3 tablets in the morning  . isosorbide mononitrate (IMDUR) 30 MG 24 hr tablet Take 1 tablet (30 mg total) by mouth daily.  . Multiple Vitamin (MULTIVITAMIN) tablet Take 1 tablet by mouth daily.  . naproxen sodium (ALEVE) 220 MG tablet Take 220 mg by mouth as needed.  . nitroGLYCERIN (NITROSTAT) 0.4 MG SL tablet Place 1 tablet (0.4 mg total) under the tongue every 5 (five) minutes as needed for chest pain.  Marland Kitchen tiZANidine (ZANAFLEX) 4 MG tablet Take 1-2 tablets (4-8 mg total) by mouth every 6 (six) hours as needed for muscle spasms.     Allergies:   Penicillins; Latex; Sulfa antibiotics; Ceftin [cefuroxime axetil]; Doxycycline; and Other   Social History   Tobacco Use  . Smoking status: Former Smoker    Packs/day: 0.30    Years: 20.00    Pack years: 6.00    Types: Cigarettes    Last attempt to quit: 12/11/2016    Years since quitting: 2.3  . Smokeless tobacco: Never Used  Substance Use Topics  . Alcohol use: Yes    Comment: socially  . Drug use: No     Family Hx: The patient's family history includes Asthma in her maternal aunt; Cancer (age of onset: 70) in her mother; Heart disease (age of onset: 3) in her father; Hypertension in her father. There is no history of Diabetes or Stroke.  ROS:   Please see the history of present illness.     All other systems reviewed and are negative.   Prior CV studies:   The following studies were reviewed today:  No new  Labs/Other Tests and Data Reviewed:    EKG:  No ECG reviewed.  Recent Labs: 11/12/2018: BUN 7; Creatinine, Ser 0.85; Potassium 3.5; Sodium 138 11/14/2018:  Hemoglobin 10.2; Platelets 221   Recent Lipid Panel Lab Results  Component Value Date/Time   CHOL 183 11/10/2018 04:18 AM   TRIG 74 11/10/2018 04:18 AM   HDL 72 11/10/2018 04:18 AM   CHOLHDL 2.5 11/10/2018 04:18 AM   LDLCALC 96 11/10/2018 04:18 AM    Wt Readings from Last 3 Encounters:  04/14/19 271 lb 8 oz (123.2 kg)  11/27/18 279 lb 12.8 oz (126.9 kg)  11/14/18 280 lb 11.2 oz (127.3 kg)     Objective:    Vital Signs:  BP 126/76   Pulse 76   Ht 5' 7.5" (1.715 m)   Wt 271 lb 8 oz (123.2 kg)   BMI 41.90 kg/m    VITAL SIGNS:  reviewed GEN:  no acute distress EYES:  sclerae anicteric, EOMI - Extraocular Movements Intact RESPIRATORY:  normal respiratory effort, symmetric expansion CARDIOVASCULAR:  no peripheral edema SKIN:  no rash, lesions or ulcers. MUSCULOSKELETAL:  no obvious deformities. NEURO:  alert and oriented x 3, no obvious focal deficit PSYCH:  normal affect  ASSESSMENT & PLAN:    1. Coronary vasospasm (HCC)   2. Essential hypertension, benign   3. NSTEMI (non-ST elevated myocardial infarction) (HCC)   4. Hiatal hernia    She has been initiated on lasix by her PCP for DOE and leg swelling, and has enjoyed an nearly asymptomatic status after initiation and titration to optimal dose. We discussed that while her echo did not show clear evidence of diastolic dysfunction, she may have a component of HFpEF, since she has had significant relief of SOB, orthopnea, leg swelling, and chest pain. Possible etiology of chest pain with this in mind is high LVEDP and compromise of small vessels. If she requires further antihypertensive therapy, could consider spironolactone in the future.   Otherwise stable on amlodipine and imdur. HTN well controlled.   Lipids within normal limits. Will pursue lifestyle modification for further improvement.   Hiatal hernia management per PCP and GI.   COVID-19 Education: The signs and symptoms of COVID-19 were discussed with the patient  and how to seek care for testing (follow up with PCP or arrange E-visit).  The importance of social distancing was discussed today.  Time:   Today, I have spent 25 minutes with the patient with telehealth technology discussing the above problems.     Medication Adjustments/Labs and Tests Ordered: Current medicines are reviewed at length with the patient today.  Concerns regarding medicines are outlined above.   Tests Ordered: No orders of the defined types were placed in this encounter.   Medication Changes: No orders of the defined types were placed in this encounter.   Disposition:  Follow up in 3 month(s)  Signed, Parke Poisson, MD  04/14/2019 4:08 PM    [facility]  Medication Instructions:  NO CHANGES If you need a refill on your cardiac medications before your next appointment, please call your pharmacy.   Lab work: NO CHANGES If you have labs (blood work) drawn today and your tests are completely normal, you will receive your results only by: Marland Kitchen MyChart Message (if you have MyChart) OR . A paper copy in the mail If you have any lab test that is abnormal or we need to change your treatment, we will call you to review the results.  Testing/Procedures: NO CHANGES  Follow-Up: At [facility], you and your health needs are our priority.  As part of our continuing mission to provide you with exceptional heart care, we have created designated Provider Care Teams.  These Care Teams include your primary Cardiologist (physician) and Advanced Practice Providers (APPs -  Physician Assistants and Nurse Practitioners) who all work together to provide you with the care you need, when you need it. You will need a follow up appointment in  3  months.  Please call our office 2 months in advance to schedule this appointment.  You may see Parke Poisson, MD  or one of the following Advanced Practice Providers on your designated Care Team:   Theodore Demark,  PA-C . Joni Reining, DNP, ANP  Any Other Special Instructions Will Be Listed Below (If Applicable).

## 2019-06-02 ENCOUNTER — Telehealth: Payer: Self-pay | Admitting: *Deleted

## 2019-06-02 NOTE — Telephone Encounter (Signed)
A message was left, re: follow up visit. 

## 2019-06-03 ENCOUNTER — Telehealth: Payer: Self-pay | Admitting: Internal Medicine

## 2019-06-03 NOTE — Telephone Encounter (Signed)
Left message to call back and verify PCP

## 2019-06-03 NOTE — Telephone Encounter (Signed)
New Message      Pt is wondering if her last appt notes can be sent to her [facility] office, Along with any future appts

## 2019-06-03 NOTE — Telephone Encounter (Signed)
Follow up    Pt is calling with PCP information  [place]  PCP is Janie Morning Office number [phone]

## 2019-06-03 NOTE — Telephone Encounter (Signed)
Advised patient ov sent to PCP

## 2019-06-19 ENCOUNTER — Telehealth: Payer: Self-pay | Admitting: Internal Medicine

## 2019-06-19 NOTE — Telephone Encounter (Signed)
° ° °  COVID-19 Pre-Screening Questions:   In the past 7 to 10 days have you had a cough,  shortness of breath, headache, congestion, fever (100 or greater) body aches, chills, sore throat, or sudden loss of taste or sense of smell? no  Have you been around anyone with known Covid 19. no  Have you been around anyone who is awaiting Covid 19 test results in the past 7 to 10 days? no  Have you been around anyone who has been exposed to Covid 19, or has mentioned symptoms of Covid 19 within the past 7 to 10 days? no  If you have any concerns/questions about symptoms patients report during screening (either on the phone or at threshold). Contact the provider seeing the patient or DOD for further guidance.  If neither are available contact a member of the leadership team.   I called pt to confirm her appt for 06-20-19 with Dr Margaretann Loveless.

## 2019-06-20 ENCOUNTER — Ambulatory Visit (INDEPENDENT_AMBULATORY_CARE_PROVIDER_SITE_OTHER): Payer: BC Managed Care – PPO | Admitting: Internal Medicine

## 2019-06-20 ENCOUNTER — Other Ambulatory Visit: Payer: Self-pay

## 2019-06-20 VITALS — BP 104/59 | HR 75 | Temp 97.9°F | Ht 67.5 in | Wt 273.8 lb

## 2019-06-20 DIAGNOSIS — E119 Type 2 diabetes mellitus without complications: Secondary | ICD-10-CM

## 2019-06-20 DIAGNOSIS — K449 Diaphragmatic hernia without obstruction or gangrene: Secondary | ICD-10-CM

## 2019-06-20 DIAGNOSIS — I10 Essential (primary) hypertension: Secondary | ICD-10-CM | POA: Diagnosis not present

## 2019-06-20 DIAGNOSIS — I201 Angina pectoris with documented spasm: Secondary | ICD-10-CM | POA: Diagnosis not present

## 2019-06-20 DIAGNOSIS — I214 Non-ST elevation (NSTEMI) myocardial infarction: Secondary | ICD-10-CM

## 2019-06-20 NOTE — Patient Instructions (Addendum)
Medication Instructions:   NO CHANGES  If you need a refill on your cardiac medications before your next appointment, please call your pharmacy.   Lab work:    NOT NEEDED  Testing/Procedures:  NOT NEEDED Follow-Up: At [facility], you and your health needs are our priority.  As part of our continuing mission to provide you with exceptional heart care, we have created designated Provider Care Teams.  These Care Teams include your primary Cardiologist (physician) and Advanced Practice Providers (APPs -  Physician Assistants and Nurse Practitioners) who all work together to provide you with the care you need, when you need it. . You will need a follow up appointment in JAN 2021.  Please call our office 2 months in advance to schedule this appointment.  You may see Elouise Munroe, MD*or one of the following Advanced Practice Providers on your designated Care Team:   . Rosaria Ferries, PA-C . Jory Sims, DNP, ANP  Any Other Special Instructions Will Be Listed Below (If Applicable).

## 2019-06-20 NOTE — Progress Notes (Signed)
Cardiology Office Note:    Date:  06/20/2019   ID:  Lindsay [name], DOB 12/30/1960, MRN [id number]  PCP:  Irena Reichmannollins, Dana, DO  Cardiologist:  Parke PoissonGayatri A Jaye Saal, MD  Electrophysiologist:  None   Referring MD: Irena Reichmannollins, Dana, DO   Chief Complaint: f/u chest pain  History of Present Illness:    Lindsay [name] is a 58 y.o. female with a hx of hx of GERD, and chest pain with no obstructive coronary artery disease, ruled out for pulmonary embolism.Diagnosis of coronary artery spasm was made based on response to intracoronary nitroglycerin in the Cath Lab. The patient continued to have some chest pain during her hospitalization which was responsive to sublingual nitroglycerin. We had initiated Imdur. We had also initiated amlodipine as an additional therapy with regard to coronary spasm. No chest pain or tightness, especially since starting the lasix. Since starting lasix she has discontinued amlodipine and has optimal blood pressure control.   Continues on lasix without issue, which has been most helpful. Continues on imdur at 30 mg. Chest pain free. Deals with stress in day to day life surrounding caring for her elderly mother who is living with her during COVID to avoid exposures at independent living facility, and caring for her husband who has had some recent medical encounters.  The patient denies chest pain, chest pressure, dyspnea at rest or with exertion, palpitations, PND, orthopnea, or leg swelling. Denies syncope or presyncope. Denies dizziness or lightheadedness. Denies snoring and has not been evaluated for sleep apnea.  Past Medical History:  Diagnosis Date  . Allergic rhinitis, cause unspecified    seasonal  . Essential hypertension, benign 2013  . GERD (gastroesophageal reflux disease)   . Joint pain   . Multiple drug allergies   . Obesity   . Symptomatic menopausal or female climacteric states   . Wears glasses   . Wears partial dentures    upper    Past Surgical  History:  Procedure Laterality Date  . COLONOSCOPY  2008   [facility]  . LEFT HEART CATH AND CORONARY ANGIOGRAPHY N/A 11/11/2018   Procedure: LEFT HEART CATH AND CORONARY ANGIOGRAPHY;  Surgeon: Yvonne KendallEnd, Christopher, MD;  Location: [facility] INVAS[facility] CV LAB;  Service: Cardiovascular;  Laterality: N/A;  . left knee surgery     arthroscopy  . ROTATOR CUFF REPAIR     right shoulder  . tonsils removed    . TUBAL LIGATION  02-1989  . WISDOM TOOTH EXTRACTION      Current Medications: Current Meds  Medication Sig  . aspirin EC 81 MG EC tablet Take 1 tablet (81 mg total) by mouth daily.  Marland Kitchen. dexlansoprazole (DEXILANT) 60 MG capsule Take 60 mg by mouth daily.  . furosemide (LASIX) 40 MG tablet Take 120 mg by mouth daily. Take  3 tablets in the morning  . isosorbide mononitrate (IMDUR) 30 MG 24 hr tablet Take 1 tablet (30 mg total) by mouth daily.  Marland Kitchen. loratadine (CLARITIN) 10 MG tablet Take 10 mg by mouth daily.  . Multiple Vitamin (MULTIVITAMIN) tablet Take 1 tablet by mouth daily.  . naproxen sodium (ALEVE) 220 MG tablet Take 220 mg by mouth as needed.  . nitroGLYCERIN (NITROSTAT) 0.4 MG SL tablet Place 1 tablet (0.4 mg total) under the tongue every 5 (five) minutes as needed for chest pain.  Marland Kitchen. POTASSIUM PO Take by mouth.  . topiramate (TOPAMAX) 25 MG capsule Take 25 mg by mouth 2 (two) times daily.  . [DISCONTINUED] amLODipine (NORVASC) 5 MG  tablet Take 1 tablet (5 mg total) by mouth daily.  . [DISCONTINUED] atorvastatin (LIPITOR) 80 MG tablet Take 1/2 tablet ( 40 mg ) daily  . [DISCONTINUED] esomeprazole (NEXIUM) 20 MG capsule Take 20 mg by mouth daily at 12 noon.  . [DISCONTINUED] tiZANidine (ZANAFLEX) 4 MG tablet Take 1-2 tablets (4-8 mg total) by mouth every 6 (six) hours as needed for muscle spasms.     Allergies:   Penicillins, Latex, Sulfa antibiotics, Ceftin [cefuroxime axetil], Doxycycline, and Other   Social History   Socioeconomic History  . Marital status: Single    Spouse name: Not  on file  . Number of children: 2  . Years of education: Not on file  . Highest education level: Not on file  Occupational History  . Occupation: Financial controller.  Social Needs  . Financial resource strain: Not on file  . Food insecurity    Worry: Not on file    Inability: Not on file  . Transportation needs    Medical: Not on file    Non-medical: Not on file  Tobacco Use  . Smoking status: Former Smoker    Packs/day: 0.30    Years: 20.00    Pack years: 6.00    Types: Cigarettes    Quit date: 12/11/2016    Years since quitting: 2.5  . Smokeless tobacco: Never Used  Substance and Sexual Activity  . Alcohol use: Yes    Comment: socially  . Drug use: No  . Sexual activity: Not on file  Lifestyle  . Physical activity    Days per week: Not on file    Minutes per session: Not on file  . Stress: Not on file  Relationships  . Social Herbalist on phone: Not on file    Gets together: Not on file    Attends religious service: Not on file    Active member of club or organization: Not on file    Attends meetings of clubs or organizations: Not on file    Relationship status: Not on file  Other Topics Concern  . 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     Family History: The patient's family history includes Asthma in her maternal aunt; Cancer (age of onset: 40) in her mother; Heart disease (age of onset: 33) in her father; Hypertension in her father. There is no history of Diabetes or Stroke.  ROS:   Please see the history of present illness.    All other systems reviewed and are negative.  EKGs/Labs/Other Studies Reviewed:    The following studies were reviewed today:  EKG:  Not performed  Recent Labs: 11/12/2018: BUN 7; Creatinine, Ser 0.85; Potassium 3.5; Sodium 138 11/14/2018: Hemoglobin 10.2; Platelets 221  Recent Lipid  Panel    Component Value Date/Time   CHOL 183 11/10/2018 0418   TRIG 74 11/10/2018 0418   HDL 72 11/10/2018 0418   CHOLHDL 2.5 11/10/2018 0418   VLDL 15 11/10/2018 0418   LDLCALC 96 11/10/2018 0418    Physical Exam:    VS:  BP (!) 104/59   Pulse 75   Temp 97.9 F (36.6 C)   Ht 5' 7.5" (1.715 m)   Wt 273 lb 12.8 oz (124.2 kg)   SpO2 99%   BMI 42.25 kg/m     Wt Readings from Last 5 Encounters:  06/20/19 273 lb 12.8 oz (124.2 kg)  04/14/19 271 lb 8 oz (123.2 kg)  11/27/18 279 lb 12.8 oz (126.9 kg)  11/14/18 280 lb 11.2 oz (127.3 kg)  01/26/14 268 lb (121.6 kg)     Constitutional: No acute distress Eyes: sclera non-icteric, normal conjunctiva and lids ENMT: normal dentition, moist mucous membranes Cardiovascular: regular rhythm, normal rate, no murmurs. S1 and S2 normal. Radial pulses normal bilaterally. No jugular venous distention.  Respiratory: clear to auscultation bilaterally GI : normal bowel sounds, soft and nontender. No distention.   MSK: extremities warm, well perfused. No edema.  NEURO: grossly nonfocal exam, moves all extremities. PSYCH: alert and oriented x 3, normal mood and affect.      ASSESSMENT:    1. Coronary vasospasm (HCC)   2. Essential hypertension, benign   3. NSTEMI (non-ST elevated myocardial infarction) (HCC)   4. Hiatal hernia   5. Type 2 diabetes mellitus without complication, without long-term current use of insulin (HCC)    PLAN:    Chest pain/coronary spasm - doing well on imdur and now with lasix, which likely is helping with LVEDP and in turn microvascular dysfunction. Continue ASA.  HTN - stable BP.   Dm2 per pcp  TIME SPENT WITH PATIENT: 25 minutes of direct patient care. More than 50% of that time was spent on coordination of care and counseling regarding chest pain and medical management.  Weston BrassGayatri Chatham Howington, MD [place]  [facility]   Medication Adjustments/Labs and Tests Ordered: Current medicines are reviewed  at length with the patient today.  Concerns regarding medicines are outlined above.  No orders of the defined types were placed in this encounter.  No orders of the defined types were placed in this encounter.   Patient Instructions  Medication Instructions:   NO CHANGES  If you need a refill on your cardiac medications before your next appointment, please call your pharmacy.   Lab work:    NOT NEEDED  Testing/Procedures:  NOT NEEDED Follow-Up: At [facility], you and your health needs are our priority.  As part of our continuing mission to provide you with exceptional heart care, we have created designated Provider Care Teams.  These Care Teams include your primary Cardiologist (physician) and Advanced Practice Providers (APPs -  Physician Assistants and Nurse Practitioners) who all work together to provide you with the care you need, when you need it. . You will need a follow up appointment in JAN 2021.  Please call our office 2 months in advance to schedule this appointment.  You may see Parke PoissonGayatri A Carrolyn Hilmes, MD*or one of the following Advanced Practice Providers on your designated Care Team:   . Theodore DemarkRhonda Barrett, PA-C . Joni ReiningKathryn Lawrence, DNP, ANP  Any Other Special Instructions Will Be Listed Below (If Applicable).

## 2019-11-19 IMAGING — CT CT ANGIO CHEST
2 of 8 series · 19 of 46 positions shown · IV contrast (iopamidol)
Comparison: Radiographs November 09, 2018.

CLINICAL DATA: Chest pain.

EXAM:
CT ANGIOGRAPHY CHEST WITH CONTRAST
TECHNIQUE: Multidetector CT imaging of the chest was performed using the
standard protocol during bolus administration of intravenous
contrast. Multiplanar CT image reconstructions and MIPs were
obtained to evaluate the vascular anatomy.
CONTRAST:  80 mL [id number]-U2F IOPAMIDOL ([id number]) INJECTION 76%

[Series 6: thins · axial · 0.71mm/px · z∈[+1284,+1520]mm · 16 of 259 slices shown]
[im 12/259  lung]
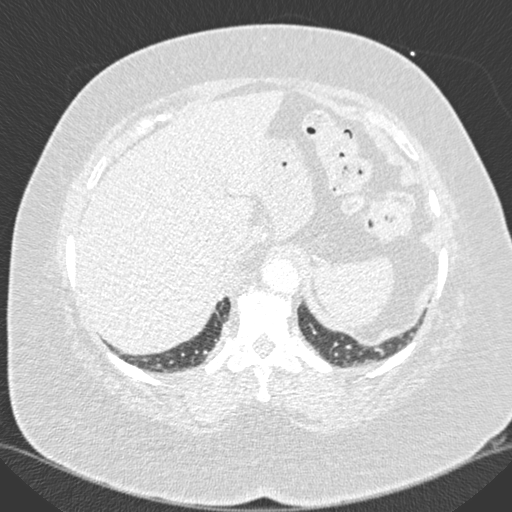
[im 24/259  soft-tissue]
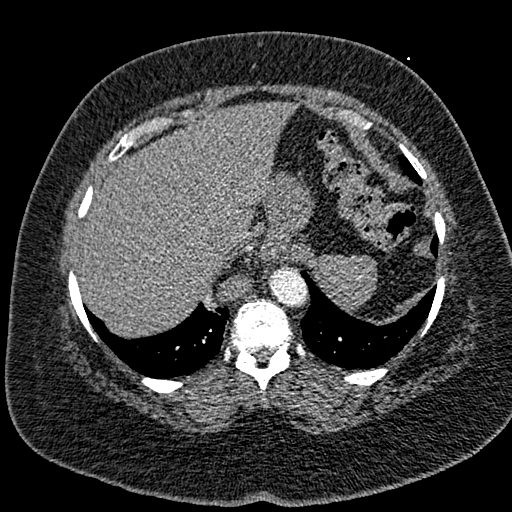
[im 47/259  lung]
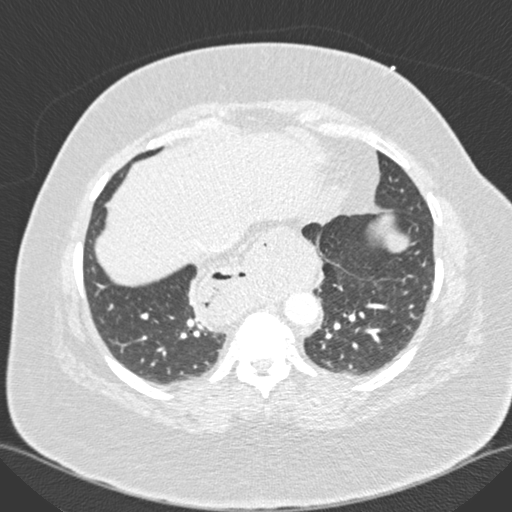
[im 59/259  soft-tissue]
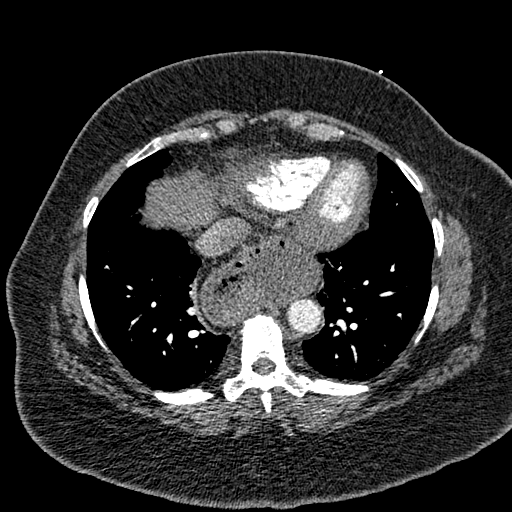
[im 71/259  lung]
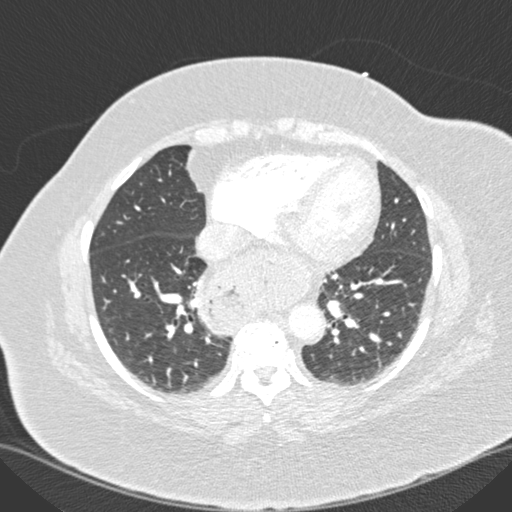
[im 94/259  soft-tissue]
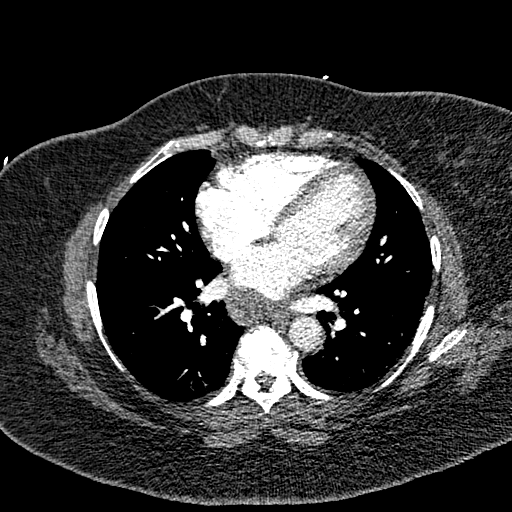
[im 106/259  lung]
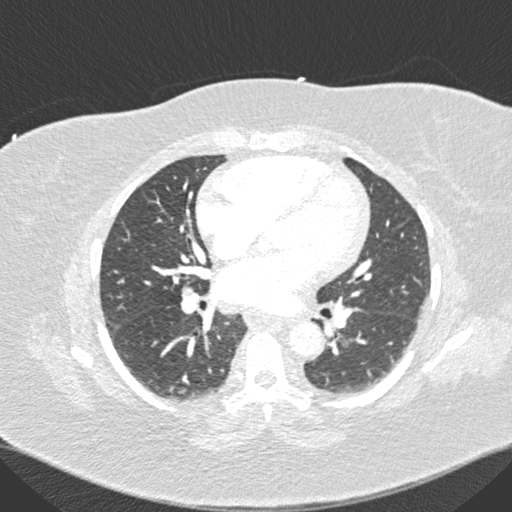
[im 118/259  soft-tissue]
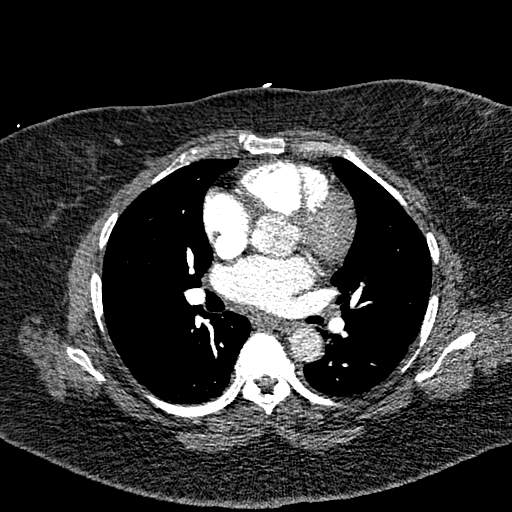
[im 141/259  lung]
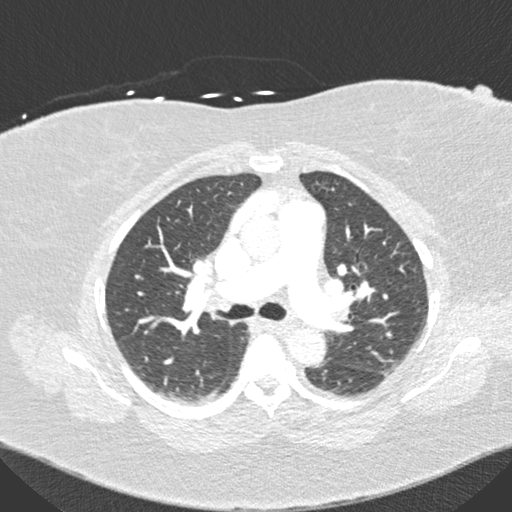
[im 153/259  soft-tissue]
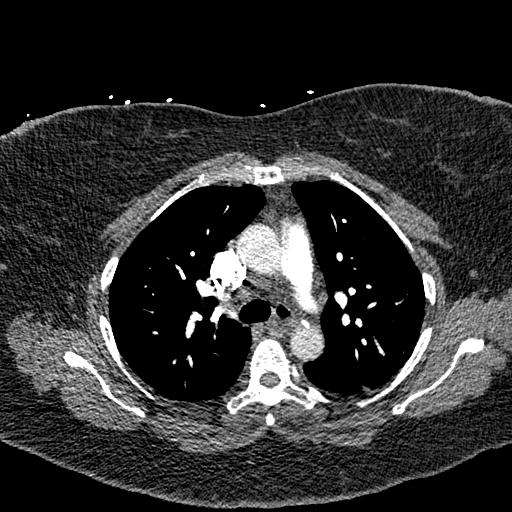
[im 165/259  lung]
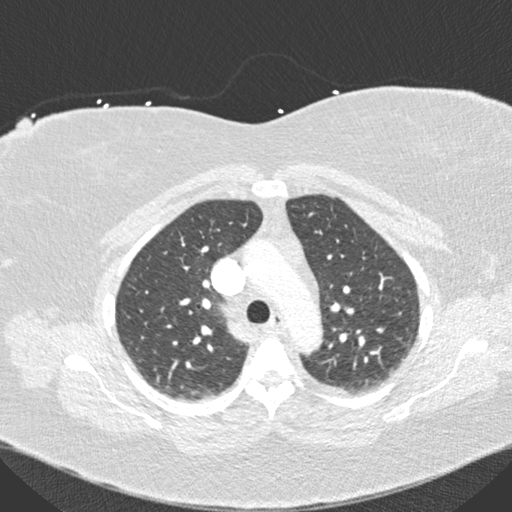
[im 188/259  soft-tissue]
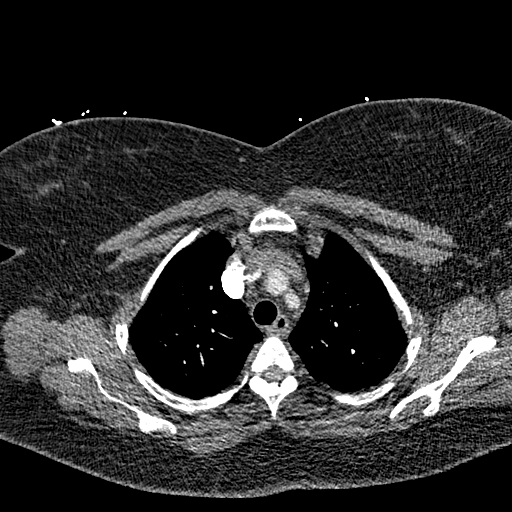
[im 200/259  lung]
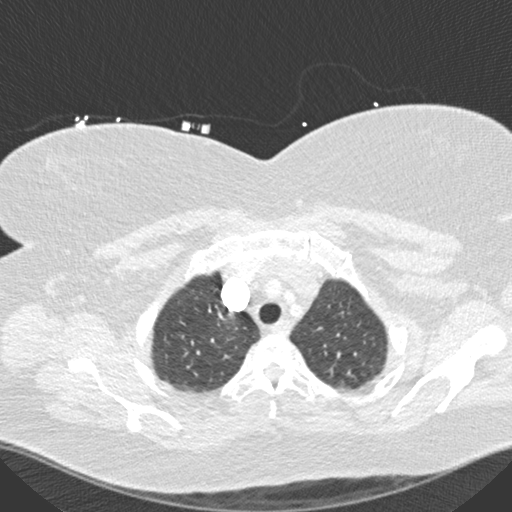
[im 212/259  soft-tissue]
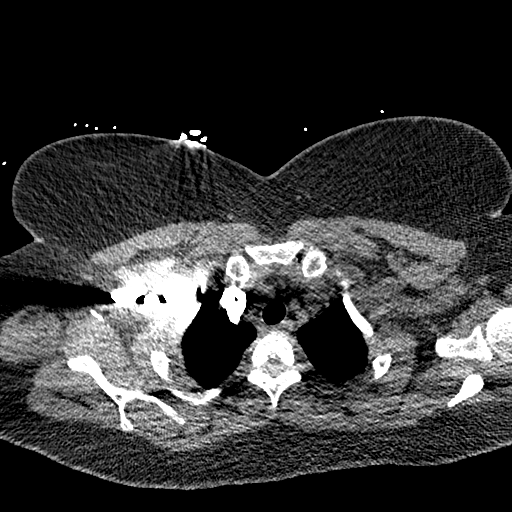
[im 235/259  lung]
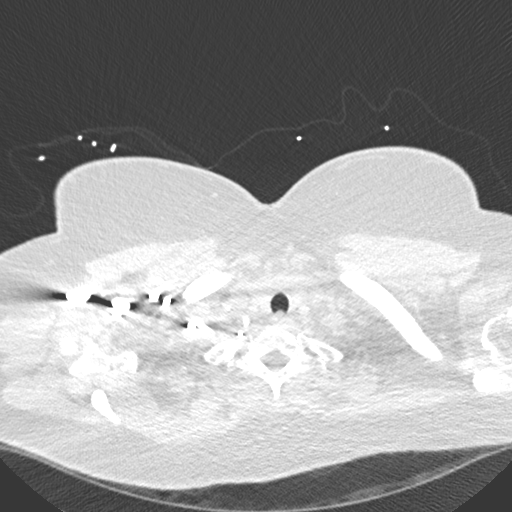
[im 247/259  soft-tissue]
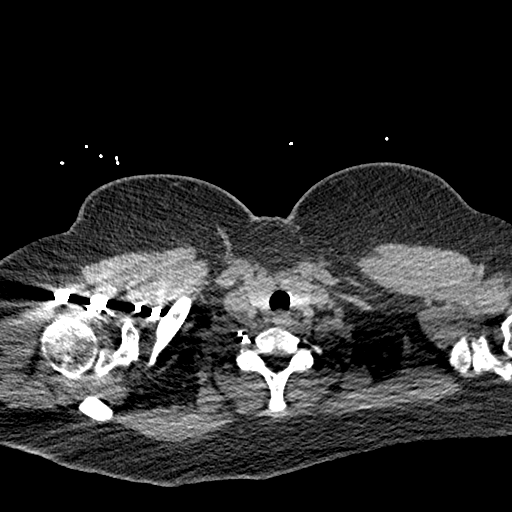

[Series 8: coronal mpr · coronal · 0.50mm/px · 3 of 151 slices shown]
[im 38/151  soft-tissue]
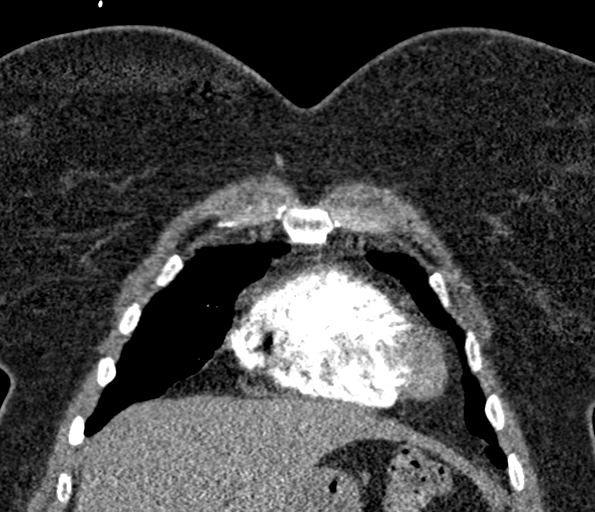
[im 76/151  soft-tissue]
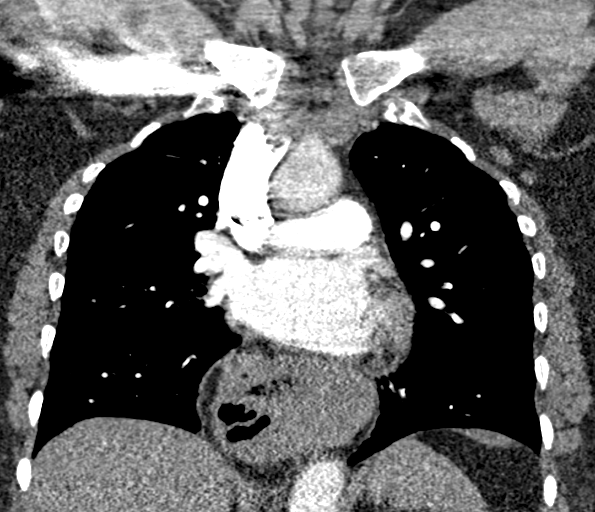
[im 113/151  soft-tissue]
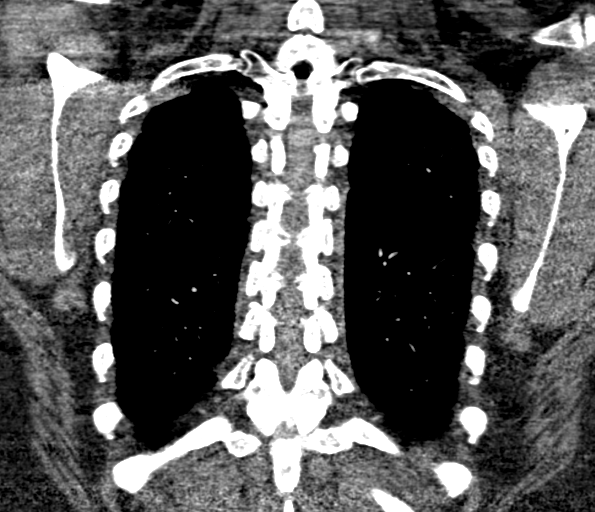

[19 of 46 positions shown; findings below may reference images not displayed]

FINDINGS: Cardiovascular: Satisfactory opacification of the pulmonary arteries
to the segmental level. No evidence of pulmonary embolism. Normal
heart size. No pericardial effusion.

Mediastinum/Nodes: Large hiatal hernia is noted. Thyroid gland is
unremarkable. No adenopathy is noted.

Lungs/Pleura: Lungs are clear. No pleural effusion or pneumothorax.

Upper Abdomen: No acute abnormality.

Musculoskeletal: No chest wall abnormality. No acute or significant
osseous findings.

Review of the MIP images confirms the above findings.
IMPRESSION: No definite evidence of pulmonary embolus. Large sliding-type hiatal
hernia. No other abnormality seen in the chest.

## 2019-12-22 ENCOUNTER — Other Ambulatory Visit: Payer: Self-pay | Admitting: Cardiology

## 2019-12-23 NOTE — Telephone Encounter (Signed)
Yes please fill

## 2020-01-01 ENCOUNTER — Ambulatory Visit: Payer: BC Managed Care – PPO | Admitting: Internal Medicine

## 2020-01-06 ENCOUNTER — Telehealth: Payer: Self-pay | Admitting: *Deleted

## 2020-01-06 NOTE — Telephone Encounter (Signed)
I spoke with Lindsay [name],she stated her husband is in ICU,recall moved out three months.

## 2020-04-27 IMAGING — US US EXTREM UP *R* LTD
1 series · 12 of 12 positions shown · non-contrast
Comparison: None.

CLINICAL DATA: Heart catheterization yesterday with right radial
access. Evaluate for hematoma.

EXAM:
ULTRASOUND RIGHT UPPER EXTREMITY LIMITED
TECHNIQUE: Ultrasound examination of the upper extremity soft tissues was
performed in the area of clinical concern.

[Series 1: us extrem up *right* ltd · 0.07mm/px · 12 of 12 slices shown]
[im 1/12]
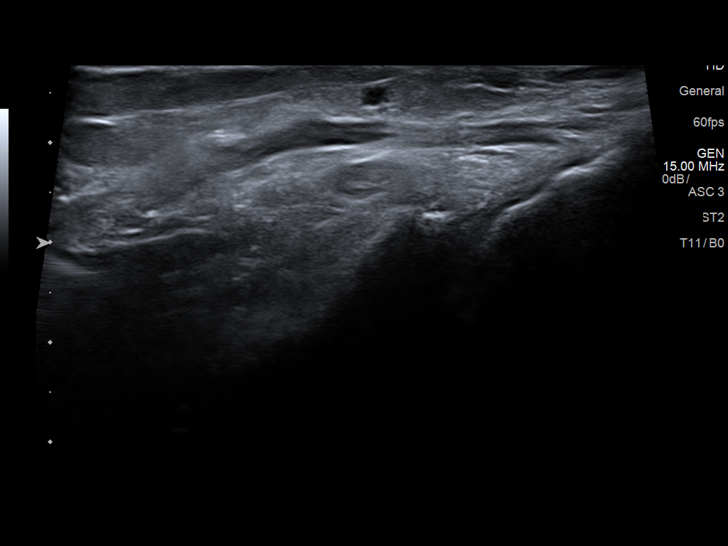
[im 2/12]
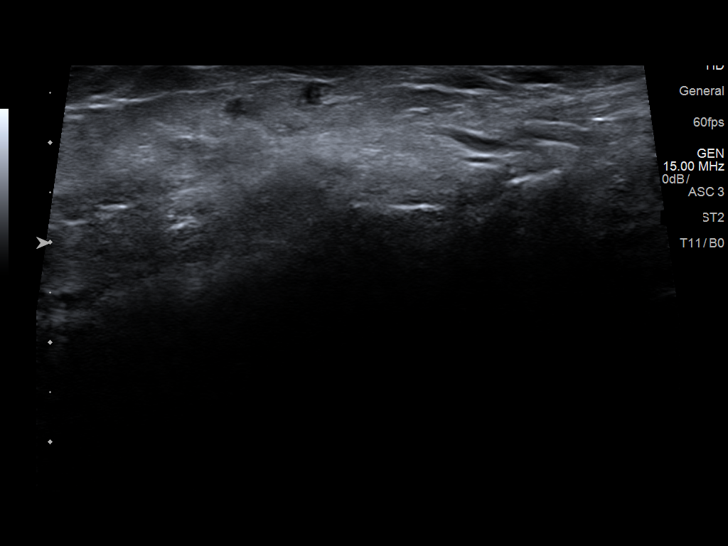
[im 3/12]
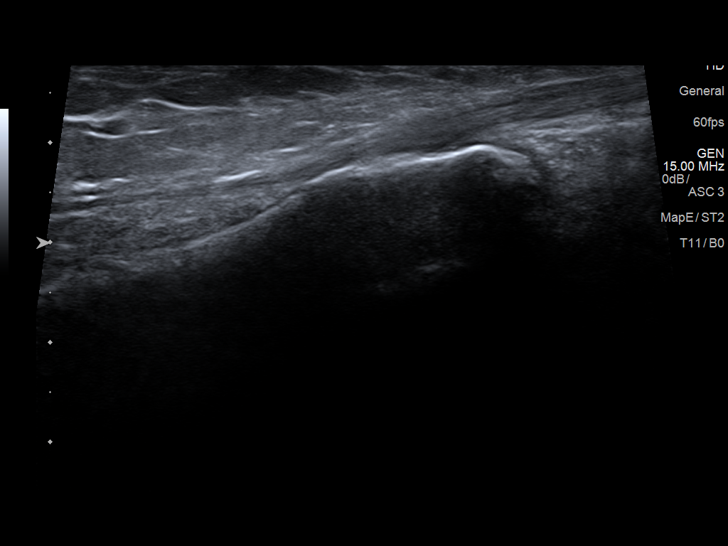
[im 4/12]
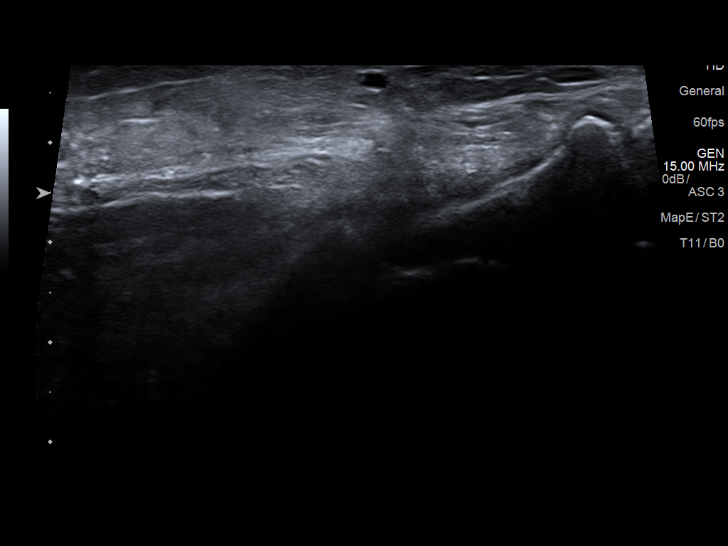
[im 5/12]
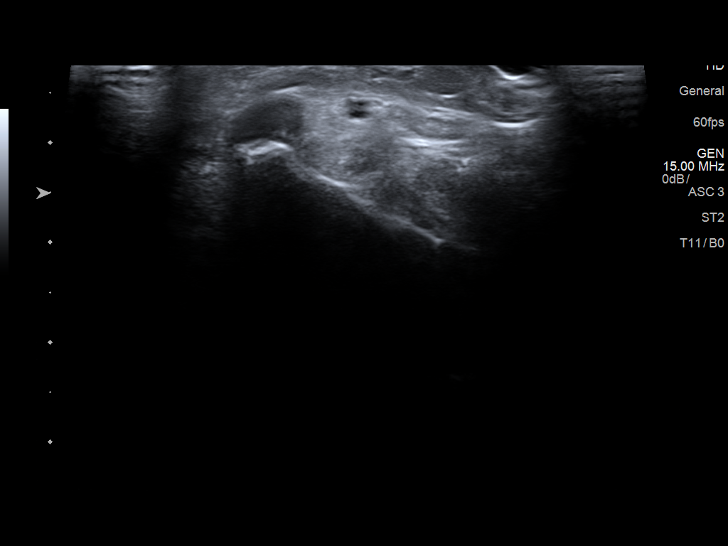
[im 6/12]
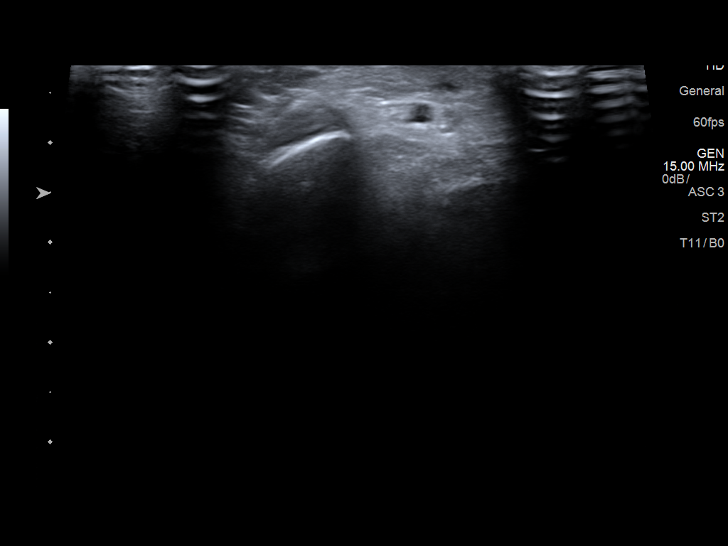
[im 7/12]
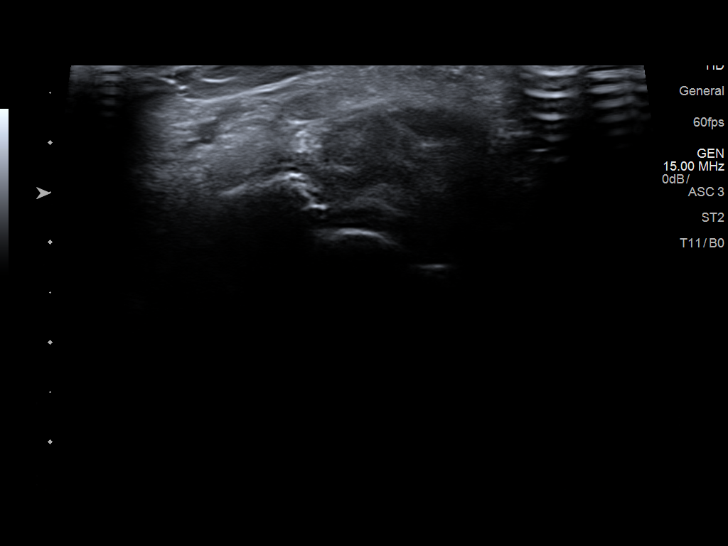
[im 8/12]
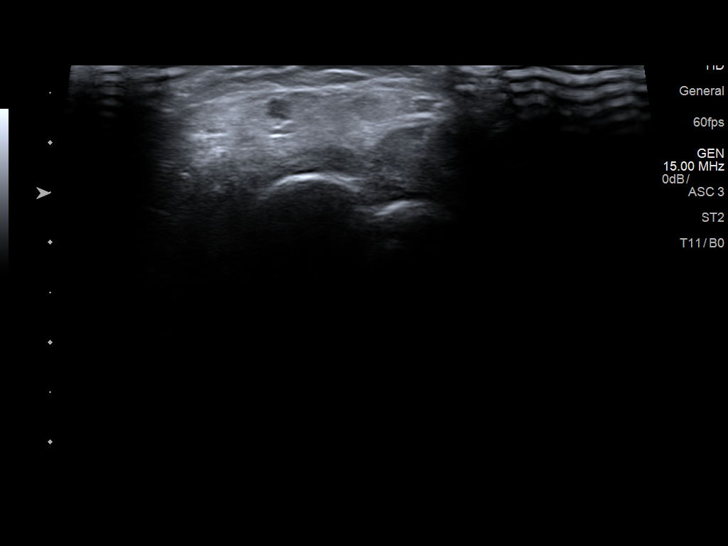
[im 9/12]
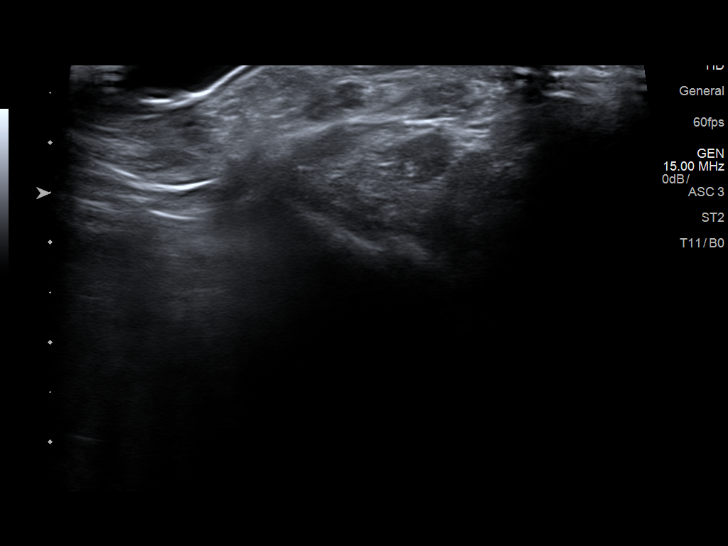
[im 10/12]
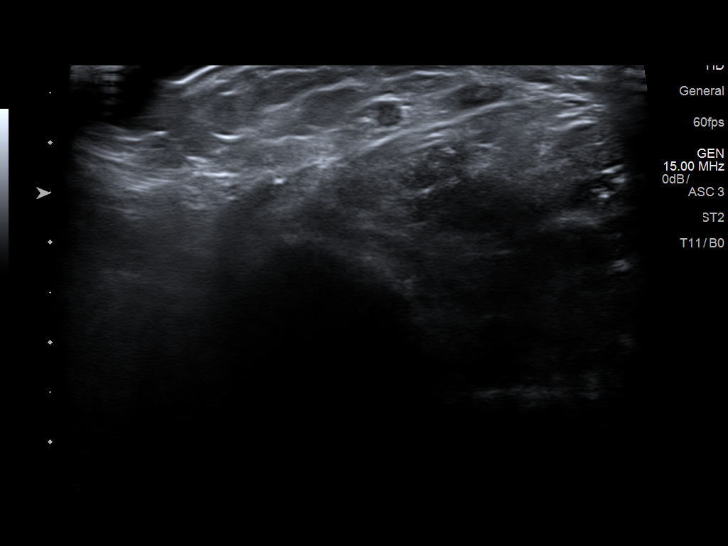
[im 11/12]
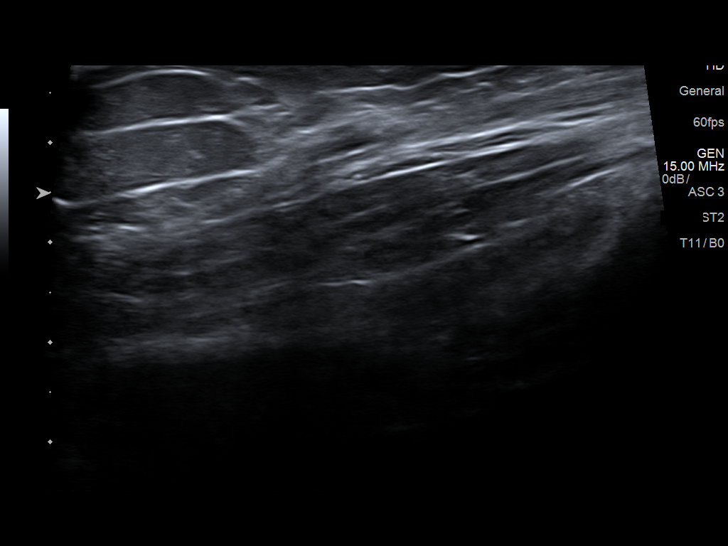
[im 12/12]
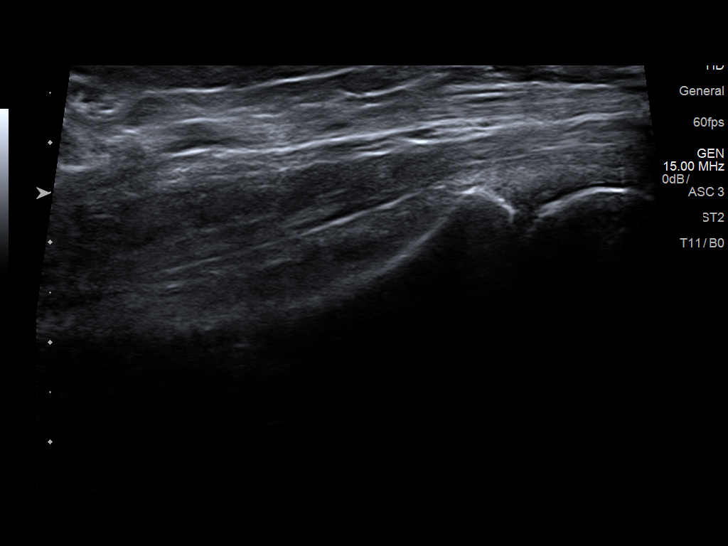

[12 of 12 positions shown; findings below may reference images not displayed]

FINDINGS: Focused ultrasound of the distal right forearm near the radial
artery demonstrates no discrete fluid collection or hematoma. No
soft tissue mass.
IMPRESSION: No hematoma or fluid collection visualized.

## 2020-05-20 ENCOUNTER — Ambulatory Visit: Payer: BC Managed Care – PPO | Admitting: Internal Medicine

## 2020-06-15 ENCOUNTER — Ambulatory Visit (INDEPENDENT_AMBULATORY_CARE_PROVIDER_SITE_OTHER): Payer: BC Managed Care – PPO | Admitting: Internal Medicine

## 2020-06-15 ENCOUNTER — Encounter: Payer: Self-pay | Admitting: Internal Medicine

## 2020-06-15 ENCOUNTER — Other Ambulatory Visit: Payer: Self-pay

## 2020-06-15 VITALS — BP 120/68 | HR 71 | Ht 67.5 in | Wt 253.2 lb

## 2020-06-15 DIAGNOSIS — I201 Angina pectoris with documented spasm: Secondary | ICD-10-CM | POA: Diagnosis not present

## 2020-06-15 DIAGNOSIS — I214 Non-ST elevation (NSTEMI) myocardial infarction: Secondary | ICD-10-CM | POA: Diagnosis not present

## 2020-06-15 DIAGNOSIS — F4321 Adjustment disorder with depressed mood: Secondary | ICD-10-CM | POA: Diagnosis not present

## 2020-06-15 DIAGNOSIS — I10 Essential (primary) hypertension: Secondary | ICD-10-CM

## 2020-06-15 DIAGNOSIS — E119 Type 2 diabetes mellitus without complications: Secondary | ICD-10-CM

## 2020-06-15 DIAGNOSIS — K449 Diaphragmatic hernia without obstruction or gangrene: Secondary | ICD-10-CM

## 2020-06-15 MED ORDER — NITROGLYCERIN 0.4 MG SL SUBL: 0.4000 mg | SUBLINGUAL_TABLET | SUBLINGUAL | 0 refills | Status: DC | PRN

## 2020-06-15 NOTE — Progress Notes (Signed)
Cardiology Office Note:    Date:  06/15/2020   ID:  Lindsay [name], DOB 12-26-1960, MRN [id number]  PCP:  Irena Reichmann, DO  Cardiologist:  Parke Poisson, MD  Electrophysiologist:  None   Referring MD: Irena Reichmann, DO   Chief Complaint: f/u chest pain  History of Present Illness:    Lindsay [name] is a 59 y.o. female with a history of GERD, and chest pain with no obstructive coronary artery disease, ruled out for pulmonary embolism.  Diagnosis of coronary artery spasm was made based on response to intracoronary nitroglycerin in the Cath Lab.  The patient continued to have some chest pain during her hospitalization which was responsive to sublingual nitroglycerin.  We had initiated Imdur.  We had also initiated amlodipine as an additional therapy with regard to coronary spasm. No chest pain or tightness, especially since starting the lasix. Since starting lasix she has discontinued amlodipine and has optimal blood pressure control. Continues on lasix without issue, which has been most helpful. Continues on imdur at 30 mg.   She unexpectedly lost her husband due to complications of surgery in February.  She is understandably continuing to grieve.  She has not had chest pain during this time and has not required sublingual nitroglycerin.  She continues to care for her elderly mother who has dementia.  She is under significant amount of stress but is discussing grief counseling with her primary care physician.   Past Medical History:  Diagnosis Date   Allergic rhinitis, cause unspecified    seasonal   Essential hypertension, benign 2013   GERD (gastroesophageal reflux disease)    Joint pain    Multiple drug allergies    Obesity    Symptomatic menopausal or female climacteric states    Wears glasses    Wears partial dentures    upper    Past Surgical History:  Procedure Laterality Date   COLONOSCOPY  2008   [facility]   LEFT HEART CATH AND CORONARY ANGIOGRAPHY  N/A 11/11/2018   Procedure: LEFT HEART CATH AND CORONARY ANGIOGRAPHY;  Surgeon: Yvonne Kendall, MD;  Location: [facility];  Service: Cardiovascular;  Laterality: N/A;   left knee surgery     arthroscopy   ROTATOR CUFF REPAIR     right shoulder   tonsils removed     TUBAL LIGATION  02-1989   WISDOM TOOTH EXTRACTION      Current Medications: Current Meds  Medication Sig   aspirin EC 81 MG EC tablet Take 1 tablet (81 mg total) by mouth daily.   dexlansoprazole (DEXILANT) 60 MG capsule Take 60 mg by mouth daily.   furosemide (LASIX) 40 MG tablet Take 120 mg by mouth daily. Take  3 tablets in the morning   isosorbide mononitrate (IMDUR) 30 MG 24 hr tablet TAKE 1 TABLET BY MOUTH EVERY DAY   loratadine (CLARITIN) 10 MG tablet Take 10 mg by mouth daily.   Multiple Vitamin (MULTIVITAMIN) tablet Take 1 tablet by mouth daily.   naproxen sodium (ALEVE) 220 MG tablet Take 220 mg by mouth as needed.   nitroGLYCERIN (NITROSTAT) 0.4 MG SL tablet Place 1 tablet (0.4 mg total) under the tongue every 5 (five) minutes as needed for chest pain.   POTASSIUM PO Take by mouth.   topiramate (TOPAMAX) 25 MG capsule Take 100 mg by mouth 2 (two) times daily.    [DISCONTINUED] nitroGLYCERIN (NITROSTAT) 0.4 MG SL tablet Place 1 tablet (0.4 mg total) under the tongue every 5 (  five) minutes as needed for chest pain.     Allergies:   Penicillins, Latex, Sulfa antibiotics, Ceftin [cefuroxime axetil], Doxycycline, and Other   Social History   Socioeconomic History   Marital status: Single    Spouse name: Not on file   Number of children: 2   Years of education: Not on file   Highest education level: Not on file  Occupational History   Occupation: Network engineer.  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   Social Determinants of Health   Financial Resource Strain:    Difficulty of Paying Living Expenses:   Food Insecurity:    Worried About Programme researcher, broadcasting/film/video in the Last Year:    Barista in the Last Year:   Transportation Needs:    Freight forwarder (Medical):    Lack of Transportation (Non-Medical):   Physical Activity:    Days of Exercise per Week:    Minutes of Exercise per Session:   Stress:    Feeling of Stress :   Social Connections:    Frequency of Communication with Friends and Family:    Frequency of Social Gatherings with Friends and Family:    Attends Religious Services:    Active Member of Clubs or Organizations:    Attends Banker Meetings:    Marital Status:      Family History: The patient's family history includes Asthma in her maternal aunt; Cancer (age of onset: 52) in her mother; Heart disease (age of onset: 8) in her father; Hypertension in her father. There is no history of Diabetes or Stroke.  ROS:   Please see the history of present illness.    All other systems reviewed and are negative.  EKGs/Labs/Other Studies Reviewed:    The following studies were reviewed today:  EKG:  NSR, rate 69  Recent Labs: No results found for requested labs within last 8760 hours.  Recent Lipid Panel    Component Value Date/Time   CHOL 183 11/10/2018 0418   TRIG 74 11/10/2018 0418   HDL 72 11/10/2018 0418   CHOLHDL 2.5 11/10/2018 0418   VLDL 15 11/10/2018 0418   LDLCALC 96 11/10/2018 0418    Physical Exam:    VS:  BP 120/68    Pulse 71    Ht 5' 7.5" (1.715 m)    Wt 253 lb 3.2 oz (114.9 kg)    SpO2 100%     BMI 39.07 kg/m     Wt Readings from Last 5 Encounters:  06/15/20 253 lb 3.2 oz (114.9 kg)  06/20/19 273 lb 12.8 oz (124.2 kg)  04/14/19 271 lb 8 oz (123.2 kg)  11/27/18 279 lb 12.8 oz (126.9 kg)  11/14/18 280 lb 11.2 oz (127.3 kg)     Constitutional: No acute distress Eyes: sclera non-icteric, normal conjunctiva and lids ENMT: normal dentition, moist mucous membranes  Cardiovascular: regular rhythm, normal rate, no murmurs. S1 and S2 normal. Radial pulses normal bilaterally. No jugular venous distention.  Respiratory: clear to auscultation bilaterally GI : normal bowel sounds, soft and nontender. No distention.   MSK: extremities warm, well perfused. No edema.  NEURO: grossly nonfocal exam, moves all extremities. PSYCH: alert and oriented x 3, normal mood and affect.   ASSESSMENT:    1. Grief   2. Essential hypertension, benign   3. Coronary vasospasm (HCC)   4. NSTEMI (non-ST elevated myocardial infarction) (HCC)   5. Hiatal hernia   6. Type 2 diabetes mellitus without complication, without long-term current use of insulin (HCC)    PLAN:    Grief -she plans to begin grief counseling for the unexpected loss of her husband in February.  Her grief has not provoked chest pain.  No change to medical therapy at this time.  I expressed my condolences and support and have encouraged her to reach out to her primary care physician for continued support.  Essential hypertension, benign - Plan: EKG 12-Lead Continue Imdur 30 mg daily, Lasix twice daily.  Coronary vasospasm (HCC)-no recent chest pain.  Can use sublingual nitro as needed.  Type 2 diabetes mellitus without complication, without long-term current use of insulin (HCC)-she plans to resume exercise at the gym soon for weight management and better control diabetes.  Weston Brass, MD [place]   [facility]    Medication Adjustments/Labs and Tests Ordered: Current medicines are reviewed at length with the patient  today.  Concerns regarding medicines are outlined above.  Orders Placed This Encounter  Procedures   EKG 12-Lead   No orders of the defined types were placed in this encounter.   Patient Instructions  Medication Instructions:  Your Physician recommend you continue on your current medication as directed.    *If you need a refill on your cardiac medications before your next appointment, please call your pharmacy*   Lab Work: None   Testing/Procedures: None   Follow-Up: At [facility], you and your health needs are our priority.  As part of our continuing mission to provide you with exceptional heart care, we have created designated Provider Care Teams.  These Care Teams include your primary Cardiologist (physician) and Advanced Practice Providers (APPs -  Physician Assistants and Nurse Practitioners) who all work together to provide you with the care you need, when you need it.  We recommend signing up for the patient portal called "MyChart".  Sign up information is provided on this After Visit Summary.  MyChart is used to connect with patients for Virtual Visits (Telemedicine).  Patients are able to view lab/test results, encounter notes, upcoming appointments, etc.  Non-urgent messages can be sent to your provider as well.   To learn more about what you can do with MyChart, go to [email or web address].    Your next appointment:   6 month(s)  The format for your next appointment:   In Person  Provider:   Weston Brass, MD

## 2020-06-15 NOTE — Patient Instructions (Signed)
Medication Instructions:  Your Physician recommend you continue on your current medication as directed.   *If you need a refill on your cardiac medications before your next appointment, please call your pharmacy*   Lab Work: None    Testing/Procedures: None   Follow-Up: At [facility], you and your health needs are our priority.  As part of our continuing mission to provide you with exceptional heart care, we have created designated Provider Care Teams.  These Care Teams include your primary Cardiologist (physician) and Advanced Practice Providers (APPs -  Physician Assistants and Nurse Practitioners) who all work together to provide you with the care you need, when you need it.  We recommend signing up for the patient portal called "MyChart".  Sign up information is provided on this After Visit Summary.  MyChart is used to connect with patients for Virtual Visits (Telemedicine).  Patients are able to view lab/test results, encounter notes, upcoming appointments, etc.  Non-urgent messages can be sent to your provider as well.   To learn more about what you can do with MyChart, go to [email or web address].    Your next appointment:   6 month(s)  The format for your next appointment:   In Person  Provider:   Gayatri Acharya, MD     

## 2020-07-23 ENCOUNTER — Other Ambulatory Visit: Payer: Self-pay | Admitting: Internal Medicine

## 2020-07-26 ENCOUNTER — Other Ambulatory Visit: Payer: Self-pay | Admitting: Internal Medicine

## 2020-07-26 MED ORDER — ISOSORBIDE MONONITRATE ER 30 MG PO TB24: 30.0000 mg | ORAL_TABLET | Freq: Every day | ORAL | 3 refills | Status: DC

## 2020-07-26 NOTE — Telephone Encounter (Signed)
Rx(s) sent to pharmacy electronically.  

## 2020-07-26 NOTE — Telephone Encounter (Signed)
*  STAT* If patient is at the pharmacy, call can be transferred to refill team.   1. Which medications need to be refilled? (please list name of each medication and dose if known) isosorbide mononitrate (IMDUR) 30 MG 24 hr tablet  2. Which pharmacy/location (including street and city if local pharmacy) is medication to be sent to? [id number] - [place], [place] - [address]  3. Do they need a 30 day or 90 day supply? 90 day supply  Patient states she has been out of medication all weekend.

## 2020-08-31 ENCOUNTER — Telehealth: Payer: Self-pay | Admitting: Internal Medicine

## 2020-08-31 NOTE — Telephone Encounter (Signed)
Patient aware to contact PCP for handicap placard request

## 2020-08-31 NOTE — Telephone Encounter (Signed)
Patient requesting handicap parking placard. Please advise

## 2020-08-31 NOTE — Telephone Encounter (Signed)
This will need to come from her primary care doctor.

## 2020-08-31 NOTE — Telephone Encounter (Signed)
New message  Pt is calling, she says her husband had passed in 22-Jan-2023 and she no longer has his Handicap [place] for parking  She says she gets really winded when walking long distances and would like to apply for the Handicap [place] for parking.  She is wondering if Dr Jacques Navy can assist her with this    Please advise

## 2020-09-15 ENCOUNTER — Telehealth: Payer: Self-pay | Admitting: Internal Medicine

## 2020-09-15 NOTE — Telephone Encounter (Signed)
lvm for patient to return call to get follow up scheduled with Acharya from recall list 

## 2020-12-07 ENCOUNTER — Other Ambulatory Visit: Payer: Self-pay | Admitting: Otolaryngology

## 2020-12-07 DIAGNOSIS — J320 Chronic maxillary sinusitis: Secondary | ICD-10-CM

## 2020-12-21 ENCOUNTER — Ambulatory Visit: Payer: BC Managed Care – PPO | Admitting: Internal Medicine

## 2021-01-05 ENCOUNTER — Other Ambulatory Visit: Payer: Self-pay

## 2021-01-20 ENCOUNTER — Ambulatory Visit (INDEPENDENT_AMBULATORY_CARE_PROVIDER_SITE_OTHER): Payer: 59 | Admitting: Internal Medicine

## 2021-01-20 ENCOUNTER — Other Ambulatory Visit: Payer: Self-pay

## 2021-01-20 ENCOUNTER — Encounter: Payer: Self-pay | Admitting: Internal Medicine

## 2021-01-20 VITALS — BP 130/78 | HR 83 | Ht 67.0 in | Wt 261.0 lb

## 2021-01-20 DIAGNOSIS — F4321 Adjustment disorder with depressed mood: Secondary | ICD-10-CM | POA: Diagnosis not present

## 2021-01-20 DIAGNOSIS — I201 Angina pectoris with documented spasm: Secondary | ICD-10-CM | POA: Diagnosis not present

## 2021-01-20 DIAGNOSIS — I214 Non-ST elevation (NSTEMI) myocardial infarction: Secondary | ICD-10-CM

## 2021-01-20 DIAGNOSIS — Z79899 Other long term (current) drug therapy: Secondary | ICD-10-CM

## 2021-01-20 DIAGNOSIS — I10 Essential (primary) hypertension: Secondary | ICD-10-CM | POA: Diagnosis not present

## 2021-01-20 DIAGNOSIS — E785 Hyperlipidemia, unspecified: Secondary | ICD-10-CM

## 2021-01-20 MED ORDER — NITROGLYCERIN 0.4 MG SL SUBL: 0.4000 mg | SUBLINGUAL_TABLET | SUBLINGUAL | 5 refills | Status: DC | PRN

## 2021-01-20 MED ORDER — ROSUVASTATIN CALCIUM 5 MG PO TABS: 5.0000 mg | ORAL_TABLET | Freq: Every day | ORAL | 3 refills | Status: DC

## 2021-01-20 NOTE — Patient Instructions (Signed)
Medication Instructions:  -Start taking Crestor (rosuvastatin) 5mg  once daily  If you need a refill on your cardiac medications before your next appointment, please call your pharmacy*   Follow-Up: At [facility], you and your health needs are our priority.  As part of our continuing mission to provide you with exceptional heart care, we have created designated Provider Care Teams.  These Care Teams include your primary Cardiologist (physician) and Advanced Practice Providers (APPs -  Physician Assistants and Nurse Practitioners) who all work together to provide you with the care you need, when you need it.  We recommend signing up for the patient portal called "MyChart".  Sign up information is provided on this After Visit Summary.  MyChart is used to connect with patients for Virtual Visits (Telemedicine).  Patients are able to view lab/test results, encounter notes, upcoming appointments, etc.  Non-urgent messages can be sent to your provider as well.   To learn more about what you can do with MyChart, go to [facility].    Your next appointment:   4 month(s)  The format for your next appointment:   In Person  Provider:   [email or web address], MD   Other Instructions Please have Primary Care Provider draw labs (Lipids and CMP) at May appointment.

## 2021-01-20 NOTE — Progress Notes (Signed)
Cardiology Office Note:    Date:  01/20/2021   ID:  Lindsay [name], DOB 1961-02-05, MRN [id number]  PCP:  Lindsay Reichmann, DO  Cardiologist:  Parke Poisson, MD  Electrophysiologist:  None   Referring MD: Lindsay Reichmann, DO   Chief Complaint/Reason for Referral: Chest pain  History of Present Illness:    Lindsay [name] is a 60 y.o. female with a history of GERD, and chest pain with no obstructive coronary artery disease, ruled out for pulmonary embolism.  Diagnosis of coronary artery spasm was made based on response to intracoronary nitroglycerin in the Cath Lab.  The patient continued to have some chest pain during her hospitalization which was responsive to sublingual nitroglycerin.  We had initiated Imdur.  We had also initiated amlodipine as an additional therapy with regard to coronary spasm. No chest pain or tightness, especially since starting the lasix. Since starting lasix she has discontinued amlodipine and has optimal blood pressure control. Continues on lasix without issue, which has been most helpful. Continues on imdur at 30 mg.    Stress related chest tightness- takes nitro for chest tightness. Nitro prn seems to help.   The patient denies dyspnea at rest or with exertion, palpitations, PND, orthopnea, or leg swelling. Denies cough, fever, chills. Denies nausea, vomiting. Denies syncope or presyncope. Denies dizziness or lightheadedness.  Past Medical History:  Diagnosis Date   Allergic rhinitis, cause unspecified    seasonal   Essential hypertension, benign 2013   GERD (gastroesophageal reflux disease)    Joint pain    Multiple drug allergies    Obesity    Symptomatic menopausal or female climacteric states    Wears glasses    Wears partial dentures    upper    Past Surgical History:  Procedure Laterality Date   COLONOSCOPY  2008   [facility]   LEFT HEART CATH AND CORONARY ANGIOGRAPHY N/A 11/11/2018   Procedure: LEFT HEART CATH AND CORONARY ANGIOGRAPHY;   Surgeon: Yvonne Kendall, MD;  Location: [facility];  Service: Cardiovascular;  Laterality: N/A;   left knee surgery     arthroscopy   ROTATOR CUFF REPAIR     right shoulder   tonsils removed     TUBAL LIGATION  02-1989   WISDOM TOOTH EXTRACTION      Current Medications: Current Meds  Medication Sig   aspirin EC 81 MG EC tablet Take 1 tablet (81 mg total) by mouth daily.   famotidine (PEPCID) 40 MG tablet 1 tablet at bedtime. prn   ferrous sulfate 325 (65 FE) MG tablet Take 325 mg by mouth daily with breakfast.   furosemide (LASIX) 40 MG tablet Take 120 mg by mouth daily. Take  3 tablets in the morning   Multiple Vitamin (MULTIVITAMIN) tablet Take 1 tablet by mouth daily.   naproxen sodium (ALEVE) 220 MG tablet Take 220 mg by mouth as needed.   nitroGLYCERIN (NITROSTAT) 0.4 MG SL tablet Place 1 tablet (0.4 mg total) under the tongue every 5 (five) minutes as needed for chest pain.   POTASSIUM PO Take by mouth.   topiramate (TOPAMAX) 25 MG capsule Take 100 mg by mouth 2 (two) times daily.      Allergies:   Penicillins, Latex, Prednisone, Sulfa antibiotics, Ceftin [cefuroxime axetil], Doxycycline, and Other   Social History   Tobacco Use   Smoking status: Former Smoker    Packs/day: 0.30    Years: 20.00    Pack years: 6.00    Types:  Cigarettes    Quit date: 12/11/2016    Years since quitting: 4.1   Smokeless tobacco: Never Used  Substance Use Topics   Alcohol use: Yes    Comment: socially   Drug use: No     Family History: The patient's family history includes Asthma in her maternal aunt; Cancer (age of onset: 36) in her mother; Heart disease (age of onset: 70) in her father; Hypertension in her father. There is no history of Diabetes or Stroke.  ROS:   Please see the history of present illness.    All other systems reviewed and are negative.  EKGs/Labs/Other Studies Reviewed:    The following studies were reviewed today:  EKG:  SR with PACs  I have  independently reviewed the images from n/a.  Recent Labs: No results found for requested labs within last 8760 hours.  Recent Lipid Panel    Component Value Date/Time   CHOL 183 11/10/2018 0418   TRIG 74 11/10/2018 0418   HDL 72 11/10/2018 0418   CHOLHDL 2.5 11/10/2018 0418   VLDL 15 11/10/2018 0418   LDLCALC 96 11/10/2018 0418    Physical Exam:    VS:  BP 130/78   Pulse 83   Ht 5\' 7"  (1.702 m)   Wt 261 lb (118.4 kg)   BMI 40.88 kg/m     Wt Readings from Last 5 Encounters:  01/20/21 261 lb (118.4 kg)  06/15/20 253 lb 3.2 oz (114.9 kg)  06/20/19 273 lb 12.8 oz (124.2 kg)  04/14/19 271 lb 8 oz (123.2 kg)  11/27/18 279 lb 12.8 oz (126.9 kg)    Constitutional: No acute distress Eyes: sclera non-icteric, normal conjunctiva and lids ENMT: normal dentition, moist mucous membranes Cardiovascular: regular rhythm, normal rate, no murmurs. S1 and S2 normal. Radial pulses normal bilaterally. No jugular venous distention.  Respiratory: clear to auscultation bilaterally GI : normal bowel sounds, soft and nontender. No distention.   MSK: extremities warm, well perfused. No edema.  NEURO: grossly nonfocal exam, moves all extremities. PSYCH: alert and oriented x 3, normal mood and affect.   ASSESSMENT:    1. Primary hypertension   2. Coronary vasospasm (HCC)   3. NSTEMI (non-ST elevated myocardial infarction) (HCC)   4. Grief   5. Hyperlipidemia LDL goal <70   6. Medication management    PLAN:    Primary hypertension - Plan: EKG 12-Lead Coronary vasospasm (HCC) - Plan: EKG 12-Lead NSTEMI (non-ST elevated myocardial infarction) (HCC) - Plan: EKG 12-Lead Grief - trigger for chest pain.  -continue imdur 30 mg daily.  - can use SL nitro as needed. - chest discomfort responds well to lasix daily.   Hyperlipidemia LDL goal <70 - recommend starting crestor 5 mg daily for LDL goal <70  Total time of encounter: 30 minutes total time of encounter, including 20 minutes spent in  face-to-face patient care on the date of this encounter. This time includes coordination of care and counseling regarding above mentioned problem list. Remainder of non-face-to-face time involved reviewing chart documents/testing relevant to the patient encounter and documentation in the medical record. I have independently reviewed documentation from referring provider.   11/29/18, MD   [facility]    Medication Adjustments/Labs and Tests Ordered: Current medicines are reviewed at length with the patient today.  Concerns regarding medicines are outlined above.   Orders Placed This Encounter  Procedures   EKG 12-Lead    Shared Decision Making/Informed Consent:       Meds  ordered this encounter  Medications   nitroGLYCERIN (NITROSTAT) 0.4 MG SL tablet    Sig: Place 1 tablet (0.4 mg total) under the tongue every 5 (five) minutes as needed for chest pain.    Dispense:  25 tablet    Refill:  5    rosuvastatin (CRESTOR) 5 MG tablet    Sig: Take 1 tablet (5 mg total) by mouth daily.    Dispense:  90 tablet    Refill:  3    Patient Instructions  Medication Instructions:  -Start taking Crestor (rosuvastatin) 5mg  once daily  If you need a refill on your cardiac medications before your next appointment, please call your pharmacy*   Follow-Up: At [facility], you and your health needs are our priority.  As part of our continuing mission to provide you with exceptional heart care, we have created designated Provider Care Teams.  These Care Teams include your primary Cardiologist (physician) and Advanced Practice Providers (APPs -  Physician Assistants and Nurse Practitioners) who all work together to provide you with the care you need, when you need it.  We recommend signing up for the patient portal called "MyChart".  Sign up information is provided on this After Visit Summary.  MyChart is used to connect with patients for Virtual Visits (Telemedicine).  Patients  are able to view lab/test results, encounter notes, upcoming appointments, etc.  Non-urgent messages can be sent to your provider as well.   To learn more about what you can do with MyChart, go to [facility].    Your next appointment:   4 month(s)  The format for your next appointment:   In Person  Provider:   [email or web address], MD   Other Instructions Please have Primary Care Provider draw labs (Lipids and CMP) at May appointment.

## 2021-04-08 ENCOUNTER — Encounter: Payer: Self-pay | Admitting: Infectious Diseases

## 2021-05-27 ENCOUNTER — Ambulatory Visit (INDEPENDENT_AMBULATORY_CARE_PROVIDER_SITE_OTHER): Payer: 59 | Admitting: Internal Medicine

## 2021-05-27 ENCOUNTER — Other Ambulatory Visit: Payer: Self-pay

## 2021-05-27 ENCOUNTER — Encounter: Payer: Self-pay | Admitting: Internal Medicine

## 2021-05-27 VITALS — BP 104/67 | HR 68 | Ht 67.5 in | Wt 260.4 lb

## 2021-05-27 DIAGNOSIS — I201 Angina pectoris with documented spasm: Secondary | ICD-10-CM | POA: Diagnosis not present

## 2021-05-27 DIAGNOSIS — I214 Non-ST elevation (NSTEMI) myocardial infarction: Secondary | ICD-10-CM | POA: Diagnosis not present

## 2021-05-27 DIAGNOSIS — E119 Type 2 diabetes mellitus without complications: Secondary | ICD-10-CM | POA: Diagnosis not present

## 2021-05-27 DIAGNOSIS — I10 Essential (primary) hypertension: Secondary | ICD-10-CM

## 2021-05-27 DIAGNOSIS — E7800 Pure hypercholesterolemia, unspecified: Secondary | ICD-10-CM

## 2021-05-27 NOTE — Progress Notes (Signed)
Cardiology Office Note:    Date:  05/27/2021   ID:  Lindsay Sahaamela H Kimberley, DOB May 15, 1961, MRN [id number]  PCP:  Irena Reichmannollins, Dana, DO  Cardiologist:  Parke PoissonGayatri A Jalin Erpelding, MD  Electrophysiologist:  None   Referring MD: Irena Reichmannollins, Dana, DO   Chief Complaint: f/u chest pain  History of Present Illness:    Lindsay [name] is a 60 y.o. female with a history of GERD, and chest pain with no obstructive coronary artery disease, ruled out for pulmonary embolism.  Diagnosis of coronary artery spasm was made based on response to intracoronary nitroglycerin in the Cath Lab.  The patient continued to have some chest pain during her hospitalization which was responsive to sublingual nitroglycerin.  We had initiated Imdur.  We had also initiated amlodipine as an additional therapy with regard to coronary spasm. No chest pain or tightness, especially since starting the lasix. Since starting lasix she has discontinued amlodipine and has optimal blood pressure control. Continues on lasix without issue, which has been most helpful. Continues on imdur at 30 mg.   She unexpectedly lost her husband due to complications of surgery in February.  She is understandably continuing to grieve.  She has not had chest pain during this time and has not required sublingual nitroglycerin.  She continues to care for her elderly mother who has dementia.  She is under significant amount of stress but is discussing grief counseling with her primary care physician.  05/27/2021 Today, she is feeling okay. She reports her arthritis has taken over her life. She continues to have testing to determine the type of arthritis she has. For pain management she is limited to Tylenol. However, she is also using Voltaren gel and pills.  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 At work, she feels she is not getting good quality air from the hot weather and wearing a mask all day. She believes this is causing  infrequent and bothersome palpitations about once or twice a month. She has not needed to take nitroglycerin. Lasix still helpful for symptoms.  In her diet she has eliminated caffeine and is solely drinking water.  Also, her mother's dementia is worsening, and now has known CHF. Her mother now has a Sports coachprofessional caretaker, but she still cares for her mother at other times.    She denies any chest pain, shortness of breath, or exertional symptoms. No headaches, lightheadedness, or syncope to report. Also has no lower extremity edema, orthopnea or PND.   Past Medical History:  Diagnosis Date   Allergic rhinitis, cause unspecified    seasonal   Essential hypertension, benign 2013   GERD (gastroesophageal reflux disease)    Joint pain    Multiple drug allergies    Obesity    Symptomatic menopausal or female climacteric states    Wears glasses    Wears partial dentures    upper    Past Surgical History:  Procedure Laterality Date   COLONOSCOPY  2008   [facility]   LEFT HEART CATH AND CORONARY ANGIOGRAPHY N/A 11/11/2018   Procedure: LEFT HEART CATH AND CORONARY ANGIOGRAPHY;  Surgeon: Yvonne KendallEnd, Christopher, MD;  Location: [facility] INVASIVE CV LAB;  Service: Cardiovascular;  Laterality: N/A;   left knee surgery     arthroscopy   ROTATOR CUFF REPAIR     right shoulder   tonsils removed     TUBAL LIGATION  02-1989   WISDOM TOOTH EXTRACTION      Current Medications:  Current Meds  Medication Sig   aspirin EC 81 MG EC tablet Take 1 tablet (81 mg total) by mouth daily.   famotidine (PEPCID) 40 MG tablet 1 tablet at bedtime. prn   ferrous sulfate 325 (65 FE) MG tablet Take 325 mg by mouth daily with breakfast.   furosemide (LASIX) 40 MG tablet Take 120 mg by mouth daily. 2 tablets 2 times daily.   isosorbide mononitrate (IMDUR) 30 MG 24 hr tablet Take 30 mg by mouth daily.   Multiple Vitamin (MULTIVITAMIN) tablet Take 1 tablet by mouth daily.   nitroGLYCERIN (NITROSTAT) 0.4 MG SL tablet  Place 1 tablet (0.4 mg total) under the tongue every 5 (five) minutes as needed for chest pain.   potassium chloride SA (KLOR-CON) 20 MEQ tablet Take 20 mEq by mouth once. 2 tablets once daily.   topiramate (TOPAMAX) 25 MG capsule Take 100 mg by mouth 2 (two) times daily.    [DISCONTINUED] naproxen sodium (ALEVE) 220 MG tablet Take 220 mg by mouth as needed.     Allergies:   Penicillins, Latex, Prednisone, Sulfa antibiotics, Ceftin [cefuroxime axetil], Doxycycline, and Other   Social History   Socioeconomic History   Marital status: Single    Spouse name: Not on file   Number of children: 2   Years of education: Not on file   Highest education level: Not on file  Occupational History   Occupation: Network engineer.  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   Social Determinants of Health   Financial Resource Strain: Not on file  Food Insecurity: Not on file  Transportation Needs: Not on file  Physical Activity: Not on file  Stress: Not on file  Social Connections: Not on file     Family History: The patient's family history includes Asthma in her maternal aunt; Cancer (age of onset: 6) in her mother; Heart disease (age of onset: 63) in her father; Hypertension in her father. There is no history of Diabetes or Stroke.  ROS:   Please see the history of present illness.    (+) Palpitations (+) Myalgias (+) Joint pain (+) Stress All other systems reviewed and are negative.  EKGs/Labs/Other Studies  Reviewed:    The following studies were reviewed today:  LHC 11/11/2018: Conclusions: Mild luminal narrowing of the ostial LAD and distal LCx that improves with intracoronary nitroglycerin, suggestive of coronary vasospasm.  No severe stenosis noted to account for chest pain at rest and mild troponin elevation. Normal left ventricular systolic function with mildly elevated filling pressure suggestive of diastolic dysfunction.   Recommendations: Stop nitroglycerin infusion and start isosorbide mononitrate for prevention of vasospasm. Consider evaluation for alternate causes of chest pain and mild troponin elevation, including pulmonary embolism. Secondary prevention of coronary artery disease.  Echo 11/10/2018: - Left ventricle: The cavity size was normal. Wall thickness was    increased in a pattern of mild LVH.  Systolic function was normal.    The estimated ejection fraction was in the range of 55% to 60%.    Wall motion was normal; there were no regional wall motion    abnormalities. Left ventricular diastolic function parameters    were normal.   Impressions:  - Normal LV function; mild LVH; mild TR.   EKG:   05/27/2021: EKG is not ordered today. 01/20/21: SR, PACs, rate 83 06/15/2020: NSR, rate 69  Recent Labs: No results found for requested labs within last 8760 hours.  Recent Lipid Panel    Component Value Date/Time   CHOL 183 11/10/2018 0418   TRIG 74 11/10/2018 0418   HDL 72 11/10/2018 0418   CHOLHDL 2.5 11/10/2018 0418   VLDL 15 11/10/2018 0418   LDLCALC 96 11/10/2018 0418    Physical Exam:    VS:  BP 104/67   Pulse 68   Ht 5' 7.5" (1.715 m)   Wt 260 lb 6.4 oz (118.1 kg)   SpO2 98%   BMI 40.18 kg/m     Wt Readings from Last 5 Encounters:  01/20/21 261 lb (118.4 kg)  06/15/20 253 lb 3.2 oz (114.9 kg)  06/20/19 273 lb 12.8 oz (124.2 kg)  04/14/19 271 lb 8 oz (123.2 kg)  11/27/18 279 lb 12.8 oz (126.9 kg)    Constitutional: No acute distress Eyes: sclera  non-icteric, normal conjunctiva and lids ENMT: normal dentition, moist mucous membranes Cardiovascular: regular rhythm, normal rate, no murmurs. S1 and S2 normal. Radial pulses normal bilaterally. No jugular venous distention.  Respiratory: clear to auscultation bilaterally GI : normal bowel sounds, soft and nontender. No distention.   MSK: extremities warm, well perfused. No edema.  NEURO: grossly nonfocal exam, moves all extremities. PSYCH: alert and oriented x 3, normal mood and affect.   ASSESSMENT:    1. Essential hypertension, benign   2. Coronary vasospasm (HCC)   3. NSTEMI (non-ST elevated myocardial infarction) (HCC)   4. Type 2 diabetes mellitus without complication, without long-term current use of insulin (HCC)   5. Pure hypercholesterolemia    PLAN:    Essential hypertension BP stable with imdur 30 mg daily, lasix 80 mg BID.  Coronary vasospasm (HCC)-no recent chest pain.  Can use sublingual nitro as needed.  Type 2 diabetes mellitus without complication, without long-term current use of insulin (HCC)- pursuing dietary changes. HbA1c 6.9%, consider oral hypoglycemic agent such as metformin.   HLD - Lab work reviewed from PCP with patient during visit. LDL 110 which is above goal. With DM2, would consider initiating statin therapy for primary prevention of CAD. ASCVD risk score is 7.9%.   Weston Brass, MD, [facility]  [facility]    Medication Adjustments/Labs and Tests Ordered: Current medicines are reviewed at length with the patient today.  Concerns regarding medicines are outlined above.  No orders of the defined types were placed in this encounter.  No orders of the defined types were placed in this encounter.   Patient Instructions  Medication Instructions:  No changes  *If you need a refill on your cardiac medications before your next appointment, please call your pharmacy*   Lab Work: Not needed   Testing/Procedures: Not  needed   Follow-Up: At [facility], you and your health needs are our priority.  As part of our continuing mission to provide you with exceptional heart care, we have created designated Provider Care Teams.  These Care Teams include your primary Cardiologist (physician) and Advanced Practice Providers (APPs -  Physician Assistants and Nurse Practitioners) who all work together to provide you with the care you need, when you need it.  We recommend signing up for the patient portal called "MyChart".  Sign up information is provided on this After Visit Summary.  MyChart is used to connect with patients for Virtual Visits (Telemedicine).  Patients are able to view lab/test results, encounter notes, upcoming appointments, etc.  Non-urgent messages can be sent to your provider as well.   To learn more about what you can do with MyChart, go to [email or web address].    Your next appointment:   12 month(s)  The format for your next appointment:   In Person  Provider:   Parke Poisson, MD        [facility],acting as a scribe for Parke Poisson, MD.,have documented all relevant documentation on the behalf of Parke Poisson, MD,as directed by  Parke Poisson, MD while in the presence of Parke Poisson, MD.  I, Parke Poisson, MD, have reviewed all documentation for this visit. The documentation on 05/27/21 for the exam, diagnosis, procedures, and orders are all accurate and complete.

## 2021-05-27 NOTE — Patient Instructions (Signed)
Medication Instructions:  No changes  *If you need a refill on your cardiac medications before your next appointment, please call your pharmacy*   Lab Work: Not needed   Testing/Procedures: Not needed   Follow-Up: At [facility], you and your health needs are our priority.  As part of our continuing mission to provide you with exceptional heart care, we have created designated Provider Care Teams.  These Care Teams include your primary Cardiologist (physician) and Advanced Practice Providers (APPs -  Physician Assistants and Nurse Practitioners) who all work together to provide you with the care you need, when you need it.  We recommend signing up for the patient portal called "MyChart".  Sign up information is provided on this After Visit Summary.  MyChart is used to connect with patients for Virtual Visits (Telemedicine).  Patients are able to view lab/test results, encounter notes, upcoming appointments, etc.  Non-urgent messages can be sent to your provider as well.   To learn more about what you can do with MyChart, go to [email or web address].    Your next appointment:   12 month(s)  The format for your next appointment:   In Person  Provider:   Parke Poisson, MD

## 2021-08-19 ENCOUNTER — Other Ambulatory Visit: Payer: Self-pay | Admitting: Internal Medicine

## 2021-12-22 ENCOUNTER — Telehealth: Payer: Self-pay | Admitting: Internal Medicine

## 2021-12-22 NOTE — Telephone Encounter (Signed)
° °  Name: Lindsay [name]  DOB: [birth year]  MRN: [id number]   Primary Cardiologist: Elouise Munroe, MD  Chart reviewed as part of pre-operative protocol coverage. Patient was contacted 12/22/2021 in reference to pre-operative risk assessment for pending surgery as outlined below.  Ragnhild Forness Wenker was last seen on 05/27/2021 by Dr. Margaretann Loveless.  Since that day, AMONI CUMINGS has done well without chest pain or worsening dyspnea.  Her RCRI perioperative risk is class I, 0.4% of major cardiac event during perioperative period.  Therefore, based on ACC/AHA guidelines, the patient would be at acceptable risk for the planned procedure without further cardiovascular testing.   The patient was advised that if she develops new symptoms prior to surgery to contact our office to arrange for a follow-up visit, and she verbalized understanding.  I will route this recommendation to the requesting party via Epic fax function and remove from pre-op pool. Please call with questions.  [place], [place] 12/22/2021, 2:41 PM

## 2021-12-22 NOTE — Telephone Encounter (Signed)
° °  Pre-operative Risk Assessment    Patient Name: Lindsay [name]  DOB: Sep 21, 1961 MRN: [id number]      Request for Surgical Clearance    Procedure:   Left Hip Replacement   Date of Surgery:  Clearance 01/12/22                                 Surgeon:  Dr. Rhona Raider  Surgeon's Group or Practice Name:  [facility]  Phone number:  [phone] Fax number:  [phone]   Type of Clearance Requested:   - Medical  - Pharmacy:  Hold Up to the cardiologist      Type of Anesthesia:  Spinal   Additional requests/questions:   n/a  Signed, Kamira J Martinique   12/22/2021, 8:40 AM

## 2022-01-02 ENCOUNTER — Other Ambulatory Visit: Payer: Self-pay | Admitting: Orthopaedic Surgery

## 2022-01-02 NOTE — Progress Notes (Signed)
Surgery orders requested via Epic inbox. °

## 2022-01-09 NOTE — Patient Instructions (Addendum)
DUE TO COVID-19 ONLY ONE VISITOR IS ALLOWED TO COME WITH YOU AND STAY IN THE WAITING ROOM ONLY DURING PRE OP AND PROCEDURE DAY OF SURGERY IF YOU ARE GOING HOME AFTER SURGERY. IF YOU ARE SPENDING THE NIGHT 2 PEOPLE MAY VISIT WITH YOU IN YOUR PRIVATE ROOM AFTER SURGERY UNTIL VISITING  HOURS ARE OVER AT 800 PM AND 1  VISITOR  MAY  SPEND THE NIGHT.   YOU NEED TO HAVE A COVID 19 TEST ON__2/3/23_____ @__8 :45_____, THIS TEST MUST BE DONE BEFORE SURGERY,                  Lindsay [name]     Your procedure is scheduled on: 01/17/22   Report to [facility]  Entrance   Report to short stay at 5:15 AM     Call this number if you have problems the morning of surgery [phone]   No food after midnight.    You may have clear liquid until 4:30 AM.    At 4:15 AM drink pre surgery drink. G2  Nothing by mouth after 4:30 AM.    CLEAR LIQUID DIET   Foods Allowed                                                                     Foods Excluded  Coffee and tea, regular and decaf                             liquids that you cannot  Plain Jell-O any favor except red or purple                                           see through such as: Fruit ices (not with fruit pulp)                                     milk, soups, orange juice  Iced Popsicles                                    All solid food Carbonated beverages, regular and diet                                    Cranberry, grape and apple juices Sports drinks like Gatorade Lightly seasoned clear broth or consume(fat free) Sugar    BRUSH YOUR TEETH MORNING OF SURGERY AND RINSE YOUR MOUTH OUT, NO CHEWING GUM CANDY OR MINTS.     Take these medicines the morning of surgery with A SIP OF WATER: Topamax   Stop taking _ASA 81 mg___________as directed by your Surgeon/Cardiologist.  Contact your Surgeon/Cardiologist for instructions on Anticoagulant Therapy prior to surgery.    DO NOT TAKE ANY DIABETIC MEDICATIONS DAY OF YOUR  SURGERY  You may not have any metal on your body including hair pins and              piercings  Do not wear jewelry, make-up, lotions, powders or perfumes, deodorant             Do not wear nail polish on your fingernails.  Do not shave  48 hours prior to surgery.                 Do not bring valuables to the hospital.  IS NOT             RESPONSIBLE   FOR VALUABLES.  Contacts, dentures or bridgework may not be worn into surgery.     Patients discharged the day of surgery will not be allowed to drive home.  IF YOU ARE HAVING SURGERY AND GOING HOME THE SAME DAY, YOU MUST HAVE AN ADULT TO DRIVE YOU HOME AND BE WITH YOU FOR 24 HOURS. YOU MAY GO HOME BY TAXI OR UBER OR ORTHERWISE, BUT AN ADULT MUST ACCOMPANY YOU HOME AND STAY WITH YOU FOR 24 HOURS.  Name and phone number of your driver:  Special Instructions: N/A              Please read over the following fact sheets you were given: _____________________________________________________________________             [facility] Pre[facility] Before surgery, you can play an important role.  Because skin is not sterile, your skin needs to be as free of germs as possible.  You can reduce the number of germs on your skin by washing with CHG (chlorahexidine gluconate) soap before surgery.  CHG is an antiseptic cleaner which kills germs and bonds with the skin to continue killing germs even after washing. Please DO NOT use if you have an allergy to CHG or antibacterial soaps.  If your skin becomes reddened/irritated stop using the CHG and inform your nurse when you arrive at Short Stay. Do not shave (including legs and underarms) for at least 48 hours prior to the first CHG shower.   Please follow these instructions carefully:  1.  Shower with CHG Soap the night before surgery and the  morning of Surgery.  2.  If you choose to wash your hair, wash your hair first as usual with your  normal   shampoo.  3.  After you shampoo, rinse your hair and body thoroughly to remove the  shampoo.                            4.  Use CHG as you would any other liquid soap.  You can apply chg directly  to the skin and wash                       Gently with a scrungie or clean washcloth.  5.  Apply the CHG Soap to your body ONLY FROM THE NECK DOWN.   Do not use on face/ open                           Wound or open sores. Avoid contact with eyes, ears mouth and genitals (private parts).                       Wash face,  Genitals (private parts) with your  normal soap.             6.  Wash thoroughly, paying special attention to the area where your surgery  will be performed.  7.  Thoroughly rinse your body with warm water from the neck down.  8.  DO NOT shower/wash with your normal soap after using and rinsing off  the CHG Soap.                9.  Pat yourself dry with a clean towel.            10.  Wear clean pajamas.            11.  Place clean sheets on your bed the night of your first shower and do not  sleep with pets. Day of Surgery : Do not apply any lotions/deodorants the morning of surgery.  Please wear clean clothes to the hospital/surgery center.  FAILURE TO FOLLOW THESE INSTRUCTIONS MAY RESULT IN THE CANCELLATION OF YOUR SURGERY PATIENT SIGNATURE_________________________________  NURSE SIGNATURE__________________________________  ________________________________________________________________________   Lindsay MireIncentive Spirometer  An incentive spirometer is a tool that can help keep your lungs clear and active. This tool measures how well you are filling your lungs with each breath. Taking long deep breaths may help reverse or decrease the chance of developing breathing (pulmonary) problems (especially infection) following: A long period of time when you are unable to move or be active. BEFORE THE PROCEDURE  If the spirometer includes an indicator to show your best effort, your nurse or  respiratory therapist will set it to a desired goal. If possible, sit up straight or lean slightly forward. Try not to slouch. Hold the incentive spirometer in an upright position. INSTRUCTIONS FOR USE  Sit on the edge of your bed if possible, or sit up as far as you can in bed or on a chair. Hold the incentive spirometer in an upright position. Breathe out normally. Place the mouthpiece in your mouth and seal your lips tightly around it. Breathe in slowly and as deeply as possible, raising the piston or the ball toward the top of the column. Hold your breath for 3-5 seconds or for as long as possible. Allow the piston or ball to fall to the bottom of the column. Remove the mouthpiece from your mouth and breathe out normally. Rest for a few seconds and repeat Steps 1 through 7 at least 10 times every 1-2 hours when you are awake. Take your time and take a few normal breaths between deep breaths. The spirometer may include an indicator to show your best effort. Use the indicator as a goal to work toward during each repetition. After each set of 10 deep breaths, practice coughing to be sure your lungs are clear. If you have an incision (the cut made at the time of surgery), support your incision when coughing by placing a pillow or rolled up towels firmly against it. Once you are able to get out of bed, walk around indoors and cough well. You may stop using the incentive spirometer when instructed by your caregiver.  RISKS AND COMPLICATIONS Take your time so you do not get dizzy or light-headed. If you are in pain, you may need to take or ask for pain medication before doing incentive spirometry. It is harder to take a deep breath if you are having pain. AFTER USE Rest and breathe slowly and easily. It can be helpful to keep track of a log of your progress. Your caregiver can  provide you with a simple table to help with this. If you are using the spirometer at home, follow these  instructions: SEEK MEDICAL CARE IF:  You are having difficultly using the spirometer. You have trouble using the spirometer as often as instructed. Your pain medication is not giving enough relief while using the spirometer. You develop fever of 100.5 F (38.1 C) or higher. SEEK IMMEDIATE MEDICAL CARE IF:  You cough up bloody sputum that had not been present before. You develop fever of 102 F (38.9 C) or greater. You develop worsening pain at or near the incision site. MAKE SURE YOU:  Understand these instructions. Will watch your condition. Will get help right away if you are not doing well or get worse. Document Released: 04/09/2007 Document Revised: 02/19/2012 Document Reviewed: 06/10/2007 [facility] Patient Information 2014 [place], [place].   ________________________________________________________________________

## 2022-01-10 ENCOUNTER — Ambulatory Visit (HOSPITAL_COMMUNITY)
Admission: RE | Admit: 2022-01-10 | Discharge: 2022-01-10 | Disposition: A | Discharge status: Home / Self Care | Payer: 59 | Source: Ambulatory Visit | Attending: Orthopaedic Surgery | Admitting: Orthopaedic Surgery

## 2022-01-10 ENCOUNTER — Other Ambulatory Visit: Payer: Self-pay

## 2022-01-10 ENCOUNTER — Encounter (HOSPITAL_COMMUNITY)
Admission: RE | Admit: 2022-01-10 | Discharge: 2022-01-10 | Disposition: A | Discharge status: Home / Self Care | Payer: 59 | Source: Ambulatory Visit | Attending: Internal Medicine | Admitting: Internal Medicine

## 2022-01-10 ENCOUNTER — Encounter (HOSPITAL_COMMUNITY): Payer: Self-pay

## 2022-01-10 VITALS — BP 128/84 | HR 105 | Temp 98.6°F | Resp 18 | Ht 67.0 in | Wt 235.0 lb

## 2022-01-10 DIAGNOSIS — Z01818 Encounter for other preprocedural examination: Secondary | ICD-10-CM

## 2022-01-10 DIAGNOSIS — I10 Essential (primary) hypertension: Secondary | ICD-10-CM | POA: Insufficient documentation

## 2022-01-10 HISTORY — DX: Unspecified osteoarthritis, unspecified site: M19.90

## 2022-01-10 HISTORY — DX: Type 2 diabetes mellitus without complications: E11.9

## 2022-01-10 LAB — CBC
HCT: 37.7 % (ref 36.0–46.0)
Hemoglobin: 11.4 g/dL — ABNORMAL LOW (ref 12.0–15.0)
MCH: 24.5 pg — ABNORMAL LOW (ref 26.0–34.0)
MCHC: 30.2 g/dL (ref 30.0–36.0)
MCV: 80.9 fL (ref 80.0–100.0)
Platelets: 310 10*3/uL (ref 150–400)
RBC: 4.66 MIL/uL (ref 3.87–5.11)
RDW: 16.4 % — ABNORMAL HIGH (ref 11.5–15.5)
WBC: 3.8 10*3/uL — ABNORMAL LOW (ref 4.0–10.5)
nRBC: 0 % (ref 0.0–0.2)

## 2022-01-10 LAB — BASIC METABOLIC PANEL
Anion gap: 9 (ref 5–15)
BUN: 15 mg/dL (ref 8–23)
CO2: 27 mmol/L (ref 22–32)
Calcium: 9.3 mg/dL (ref 8.9–10.3)
Chloride: 106 mmol/L (ref 98–111)
Creatinine, Ser: 0.72 mg/dL (ref 0.44–1.00)
GFR, Estimated: >60 mL/min (ref >60)
Glucose, Bld: 101 mg/dL — ABNORMAL HIGH (ref 70–99)
Potassium: 3.1 mmol/L — ABNORMAL LOW (ref 3.5–5.1)
Sodium: 142 mmol/L (ref 135–145)

## 2022-01-10 LAB — SURGICAL PCR SCREEN
MRSA, PCR: NEGATIVE
Staphylococcus aureus: NEGATIVE

## 2022-01-10 LAB — GLUCOSE, CAPILLARY: Glucose-Capillary: 117 mg/dL — ABNORMAL HIGH (ref 70–99)

## 2022-01-10 NOTE — Progress Notes (Addendum)
COVID test-01/13/22 at 8:45 COVID vaccine manufacturer: [employer]   PCP - Dr. Irena Reichmann Cardiologist - Dr. Elspeth Cho  Chest x-ray - 01/10/22- epic EKG - 01/10/22-chart Stress Test - no ECHO - 2019-epic Cardiac Cath - 2019-epic Pacemaker/ICD device last checked:NA  Sleep Study - no CPAP -   Fasting Blood Sugar - 100-110 Checks Blood Sugar _____ times a day. Once a week  Blood Thinner Instructions:ASA 81/ Dr. Jacques Navy for heart spasm Aspirin Instructions:none/ Pt will call Dr. Jacques Navy Last Dose:  Anesthesia review: yes  Patient denies shortness of breath, fever, cough and chest pain at PAT appointment Pt has no SOB with any activities. BMI 40.34  Patient verbalized understanding of instructions that were given to them at the PAT appointment. Patient was also instructed that they will need to review over the PAT instructions again at home before surgery.yes

## 2022-01-11 NOTE — Progress Notes (Signed)
Anesthesia Chart Review   Case: [id number] Date/Time: 01/17/22 0715   Procedure: LEFT TOTAL HIP ARTHROPLASTY ANTERIOR APPROACH (Left: Hip)   Anesthesia type: Spinal   Pre-op diagnosis: LEFT HIP DEGENERATIVE JOINT DISEASE   Location: [facility] / [facility]   Surgeons: Marcene Corning, MD       DISCUSSION:61 y.o. former smoker with h/o HTN, DM II diet controlled, left hip djd scheduled for above procedure 01/17/2022 with Dr. Marcene Corning.   Pt last seen by cardiology 05/27/2021.  Followed by HTN, coronary vasospasm.   Per cardiology preoperative evaluation 12/22/2021, "Chart reviewed as part of pre-operative protocol coverage. Patient was contacted 12/22/2021 in reference to pre-operative risk assessment for pending surgery as outlined below.  Lindsay [name] was last seen on 05/27/2021 by Dr. Jacques Navy.  Since that day, Lindsay [name] has done well without chest pain or worsening dyspnea.  Her RCRI perioperative risk is class I, 0.4% of major cardiac event during perioperative period.   Therefore, based on ACC/AHA guidelines, the patient would be at acceptable risk for the planned procedure without further cardiovascular testing."  Anticipate pt can proceed with planned procedure barring acute status change.   VS: BP 128/84    Pulse (!) 105    Temp 37 C (Oral)    Resp 18    Ht 5\' 4"  (1.626 m)    Wt 106.6 kg    SpO2 99%    BMI 40.34 kg/m   PROVIDERS: , DO is PCP   Irena Reichmann, MD is Cardiologist  LABS: Labs reviewed: Acceptable for surgery. (all labs ordered are listed, but only abnormal results are displayed)  Labs Reviewed  BASIC METABOLIC PANEL - Abnormal; Notable for the following components:      Result Value   Potassium 3.1 (*)    Glucose, Bld 101 (*)    All other components within normal limits  CBC - Abnormal; Notable for the following components:   WBC 3.8 (*)    Hemoglobin 11.4 (*)    MCH 24.5 (*)    RDW 16.4 (*)    All other components within normal limits   GLUCOSE, CAPILLARY - Abnormal; Notable for the following components:   Glucose-Capillary 117 (*)    All other components within normal limits  SURGICAL PCR SCREEN  TYPE AND SCREEN     IMAGES:   EKG: 01/10/2022 Sinus tachycardia with Premature atrial complexes Left axis deviation Cannot rule out Anterior infarct , age undetermined Abnormal ECG  CV: Echo 11/10/2018 Study Conclusions   - Left ventricle: The cavity size was normal. Wall thickness was    increased in a pattern of mild LVH. Systolic function was normal.    The estimated ejection fraction was in the range of 55% to 60%.    Wall motion was normal; there were no regional wall motion    abnormalities. Left ventricular diastolic function parameters    were normal.   Impressions:   - Normal LV function; mild LVH; mild TR.   Cardiac Cath 11/11/2018 Conclusions: Mild luminal narrowing of the ostial LAD and distal LCx that improves with intracoronary nitroglycerin, suggestive of coronary vasospasm.  No severe stenosis noted to account for chest pain at rest and mild troponin elevation. Normal left ventricular systolic function with mildly elevated filling pressure suggestive of diastolic dysfunction.   Recommendations: Stop nitroglycerin infusion and start isosorbide mononitrate for prevention of vasospasm. Consider evaluation for alternate causes of chest pain and mild troponin elevation, including pulmonary embolism. Secondary  prevention of coronary artery disease.   Past Medical History:  Diagnosis Date   Allergic rhinitis, cause unspecified    seasonal   Anginal pain (HCC) 2013   Stress   Arthritis    hips, thumb,knees   Diabetes mellitus without complication (HCC)    Essential hypertension, benign 12/12/2011   Joint pain    Multiple drug allergies    Obesity    Wears glasses    Wears partial dentures    upper    Past Surgical History:  Procedure Laterality Date   COLONOSCOPY  12/11/2006   [facility]  Physicians   LEFT HEART CATH AND CORONARY ANGIOGRAPHY N/A 11/11/2018   Procedure: LEFT HEART CATH AND CORONARY ANGIOGRAPHY;  Surgeon: Yvonne Kendall, MD;  Location: [facility] INVASIVE [facility];  Service: Cardiovascular;  Laterality: N/A;   left knee surgery  1990   arthroscopy   ROTATOR CUFF REPAIR  09/1990   right shoulder   tonsils removed     as a child   TUBAL LIGATION  02/08/1989   WISDOM TOOTH EXTRACTION     age 51s    MEDICATIONS:  acetaminophen (TYLENOL) 650 MG CR tablet   aspirin EC 81 MG EC tablet   azelastine (ASTELIN) 0.1 % nasal spray   diclofenac (VOLTAREN) 75 MG EC tablet   famotidine (PEPCID) 40 MG tablet   ferrous sulfate 325 (65 FE) MG tablet   furosemide (LASIX) 40 MG tablet   gabapentin (NEURONTIN) 300 MG capsule   isosorbide mononitrate (IMDUR) 30 MG 24 hr tablet   lidocaine (LIDODERM) 5 %   metaxalone (SKELAXIN) 800 MG tablet   Multiple Vitamin (MULTIVITAMIN) tablet   nitroGLYCERIN (NITROSTAT) 0.4 MG SL tablet   potassium chloride SA (KLOR-CON) 20 MEQ tablet   tirzepatide (MOUNJARO) 5 MG/0.5ML Pen   topiramate (TOPAMAX) 100 MG tablet   No current facility-administered medications for this encounter.     Jodell Cipro Ward, PA-C [facility]-Surgical Testing [phone]

## 2022-01-13 ENCOUNTER — Encounter (HOSPITAL_COMMUNITY)
Admission: RE | Admit: 2022-01-13 | Discharge: 2022-01-13 | Disposition: A | Discharge status: Home / Self Care | Payer: 59 | Source: Ambulatory Visit | Attending: Orthopaedic Surgery | Admitting: Orthopaedic Surgery

## 2022-01-13 ENCOUNTER — Other Ambulatory Visit: Payer: Self-pay

## 2022-01-13 DIAGNOSIS — Z20822 Contact with and (suspected) exposure to covid-19: Secondary | ICD-10-CM | POA: Diagnosis not present

## 2022-01-13 DIAGNOSIS — Z01812 Encounter for preprocedural laboratory examination: Secondary | ICD-10-CM | POA: Diagnosis present

## 2022-01-13 LAB — SARS CORONAVIRUS 2 (TAT 6-24 HRS): SARS Coronavirus 2: NEGATIVE

## 2022-01-16 NOTE — H&P (Signed)
TOTAL HIP ADMISSION H&P  Patient is admitted for left total hip arthroplasty.  Subjective:  Chief Complaint: left hip pain  HPI: Lindsay [name], 61 y.o. female, has a history of pain and functional disability in the left hip(s) due to arthritis and patient has failed non-surgical conservative treatments for greater than 12 weeks to include NSAID's and/or analgesics, corticosteriod injections, flexibility and strengthening excercises, supervised PT with diminished ADL's post treatment, use of assistive devices, weight reduction as appropriate, and activity modification.  Onset of symptoms was gradual starting 5 years ago with gradually worsening course since that time.The patient noted no past surgery on the left hip(s).  Patient currently rates pain in the left hip at 10 out of 10 with activity. Patient has night pain, worsening of pain with activity and weight bearing, trendelenberg gait, pain that interfers with activities of daily living, and crepitus. Patient has evidence of subchondral cysts, subchondral sclerosis, periarticular osteophytes, and joint space narrowing by imaging studies. This condition presents safety issues increasing the risk of falls. There is no current active infection.  Patient Active Problem List   Diagnosis Date Noted   Coronary vasospasm ([place]) 11/14/2018   NSTEMI (non-ST elevated myocardial infarction) ([place]) 11/09/2018   Essential hypertension, benign 01/26/2014   Obesity, unspecified 01/26/2014   Allergy, urticaria 04/02/2012   Past Medical History:  Diagnosis Date   Allergic rhinitis, cause unspecified    seasonal   Anginal pain ([place]) 2013   Stress   Arthritis    hips, thumb,knees   Diabetes mellitus without complication ([place])    Essential hypertension, benign 12/12/2011   Joint pain    Multiple drug allergies    Obesity    Wears glasses    Wears partial dentures    upper    Past Surgical History:  Procedure Laterality Date   COLONOSCOPY  12/11/2006    [facility]   LEFT HEART CATH AND CORONARY ANGIOGRAPHY N/A 11/11/2018   Procedure: LEFT HEART CATH AND CORONARY ANGIOGRAPHY;  Surgeon: Nelva Bush, MD;  Location: [place] CV LAB;  Service: Cardiovascular;  Laterality: N/A;   left knee surgery  1990   arthroscopy   ROTATOR CUFF REPAIR  09/1990   right shoulder   tonsils removed     as a child   TUBAL LIGATION  02/08/1989   WISDOM TOOTH EXTRACTION     age 89s    No current facility-administered medications for this encounter.   Current Outpatient Medications  Medication Sig Dispense Refill Last Dose   acetaminophen (TYLENOL) 650 MG CR tablet Take 1,300 mg by mouth every 8 (eight) hours as needed for pain.      aspirin EC 81 MG EC tablet Take 1 tablet (81 mg total) by mouth daily.      azelastine (ASTELIN) 0.1 % nasal spray Place 1 spray into both nostrils 2 (two) times daily. Use in each nostril as directed      diclofenac (VOLTAREN) 75 MG EC tablet Take 75 mg by mouth in the morning and at bedtime.      famotidine (PEPCID) 40 MG tablet Take 40 mg by mouth daily as needed for heartburn or indigestion.      ferrous sulfate 325 (65 FE) MG tablet Take 325 mg by mouth daily with breakfast.      furosemide (LASIX) 40 MG tablet Take 80 mg by mouth 2 (two) times daily.      gabapentin (NEURONTIN) 300 MG capsule Take 300 mg by mouth 3 (three) times daily.  isosorbide mononitrate (IMDUR) 30 MG 24 hr tablet Take 30 mg by mouth at bedtime.      lidocaine (LIDODERM) 5 % Place 1 patch onto the skin every 12 (twelve) hours.      metaxalone (SKELAXIN) 800 MG tablet Take 800 mg by mouth in the morning and at bedtime.      Multiple Vitamin (MULTIVITAMIN) tablet Take 1 tablet by mouth daily.      nitroGLYCERIN (NITROSTAT) 0.4 MG SL tablet Place 1 tablet (0.4 mg total) under the tongue every 5 (five) minutes as needed for chest pain. 25 tablet 5    potassium chloride SA (KLOR-CON) 20 MEQ tablet Take 40 mEq by mouth once.       tirzepatide [facility]) 5 MG/0.5ML Pen Inject 5 mg into the skin once a week.      topiramate (TOPAMAX) 100 MG tablet Take 100 mg by mouth 2 (two) times daily.      Allergies  Allergen Reactions   Latex Anaphylaxis    Depending on how long pt is touched with latex items   Penicillins Anaphylaxis   Prednisone Swelling   Sulfa Antibiotics Hives   Ceftin [Cefuroxime Axetil] Rash    Tolerates Keflex   Doxycycline Rash   Other Rash    ALL MYCINS    Social History   Tobacco Use   Smoking status: Former    Packs/day: 0.30    Years: 20.00    Pack years: 6.00    Types: Cigarettes    Quit date: 12/11/2016    Years since quitting: 5.1   Smokeless tobacco: Never  Substance Use Topics   Alcohol use: Yes    Comment: socially    Family History  Problem Relation Age of Onset   Heart disease Father 50       MI   Hypertension Father    Cancer Mother 21       colon   Asthma Maternal Aunt    Diabetes Neg Hx    Stroke Neg Hx      Review of Systems  Musculoskeletal:  Positive for arthralgias.       Left hip  All other systems reviewed and are negative.  Objective:  Physical Exam Constitutional:      Appearance: Normal appearance.  HENT:     Head: Normocephalic and atraumatic.     Nose: Nose normal.     Mouth/Throat:     Pharynx: Oropharynx is clear.  Eyes:     Extraocular Movements: Extraocular movements intact.  Cardiovascular:     Rate and Rhythm: Regular rhythm.  Pulmonary:     Breath sounds: Normal breath sounds.  Abdominal:     Palpations: Abdomen is soft.  Musculoskeletal:     Cervical back: Normal range of motion.     Comments: Both hips are extremely painful to attempted motion.  Her leg lengths are roughly equal.  She is walking with a markedly altered gait.  Sensation and motor function are intact distally.    Skin:    General: Skin is warm and dry.  Neurological:     General: No focal deficit present.     Mental Status: She is alert and oriented to person,  place, and time.  Psychiatric:        Mood and Affect: Mood normal.        Behavior: Behavior normal.    Vital signs in last 24 hours:    Labs:   Estimated body mass index is 36.81 kg/m as calculated  from the following:   Height as of 01/10/22: 5\' 7"  (1.702 m).   Weight as of 01/10/22: 106.6 kg.   Imaging Review Plain radiographs demonstrate severe degenerative joint disease of the left hip(s). The bone quality appears to be good for age and reported activity level.   Assessment/Plan:  End stage primary arthritis, left hip(s)  The patient history, physical examination, clinical judgement of the provider and imaging studies are consistent with end stage degenerative joint disease of the left hip(s) and total hip arthroplasty is deemed medically necessary. The treatment options including medical management, injection therapy, arthroscopy and arthroplasty were discussed at length. The risks and benefits of total hip arthroplasty were presented and reviewed. The risks due to aseptic loosening, infection, stiffness, dislocation/subluxation,  thromboembolic complications and other imponderables were discussed.  The patient acknowledged the explanation, agreed to proceed with the plan and consent was signed. Patient is being admitted for inpatient treatment for surgery, pain control, PT, OT, prophylactic antibiotics, VTE prophylaxis, progressive ambulation and ADL's and discharge planning.The patient is planning to be discharged home with home health services

## 2022-01-17 ENCOUNTER — Encounter (HOSPITAL_COMMUNITY): Payer: Self-pay | Admitting: Orthopaedic Surgery

## 2022-01-17 ENCOUNTER — Other Ambulatory Visit: Payer: Self-pay

## 2022-01-17 ENCOUNTER — Ambulatory Visit (HOSPITAL_COMMUNITY): Payer: 59 | Admitting: Physician Assistant

## 2022-01-17 ENCOUNTER — Ambulatory Visit (HOSPITAL_COMMUNITY): Payer: 59

## 2022-01-17 ENCOUNTER — Encounter (HOSPITAL_COMMUNITY)
Admission: AD | Disposition: A | Discharge status: Home / Self Care | Payer: Self-pay | Source: Home / Self Care | Attending: Orthopaedic Surgery

## 2022-01-17 ENCOUNTER — Ambulatory Visit (HOSPITAL_COMMUNITY): Payer: 59 | Admitting: Certified Registered"

## 2022-01-17 ENCOUNTER — Observation Stay (HOSPITAL_COMMUNITY)
Admission: AD | Admit: 2022-01-17 | Discharge: 2022-01-19 | Disposition: A | Discharge status: Home / Self Care | Payer: 59 | Attending: Orthopaedic Surgery | Admitting: Orthopaedic Surgery

## 2022-01-17 DIAGNOSIS — Z91040 Latex allergy status: Secondary | ICD-10-CM | POA: Insufficient documentation

## 2022-01-17 DIAGNOSIS — Z7982 Long term (current) use of aspirin: Secondary | ICD-10-CM | POA: Diagnosis not present

## 2022-01-17 DIAGNOSIS — F17210 Nicotine dependence, cigarettes, uncomplicated: Secondary | ICD-10-CM | POA: Insufficient documentation

## 2022-01-17 DIAGNOSIS — M1612 Unilateral primary osteoarthritis, left hip: Principal | ICD-10-CM | POA: Insufficient documentation

## 2022-01-17 DIAGNOSIS — Z419 Encounter for procedure for purposes other than remedying health state, unspecified: Secondary | ICD-10-CM

## 2022-01-17 DIAGNOSIS — E119 Type 2 diabetes mellitus without complications: Secondary | ICD-10-CM | POA: Insufficient documentation

## 2022-01-17 DIAGNOSIS — Z96652 Presence of left artificial knee joint: Secondary | ICD-10-CM | POA: Insufficient documentation

## 2022-01-17 HISTORY — PX: TOTAL HIP ARTHROPLASTY: SHX124

## 2022-01-17 LAB — TYPE AND SCREEN
ABO/RH(D): O POS
Antibody Screen: NEGATIVE

## 2022-01-17 LAB — GLUCOSE, CAPILLARY
Glucose-Capillary: 89 mg/dL (ref 70–99)
Glucose-Capillary: 79 mg/dL (ref 70–99)

## 2022-01-17 LAB — ABO/RH: ABO/RH(D): O POS

## 2022-01-17 SURGERY — ARTHROPLASTY, HIP, TOTAL, ANTERIOR APPROACH
Anesthesia: Spinal | Site: Hip | Laterality: Left

## 2022-01-17 MED ORDER — METOCLOPRAMIDE HCL 5 MG/ML IJ SOLN: 5.0000 mg | Freq: Three times a day (TID) | INTRAMUSCULAR | Status: DC | PRN

## 2022-01-17 MED ORDER — AZELASTINE HCL 0.1 % NA SOLN
1.0000 | Freq: Two times a day (BID) | NASAL | Status: DC
  Administered 2022-01-17 – 2022-01-19 (×4): 1 via NASAL
  Filled 2022-01-17 (×2): qty 30

## 2022-01-17 MED ORDER — ACETAMINOPHEN 500 MG PO TABS
1000.0000 mg | ORAL_TABLET | Freq: Once | ORAL | Status: AC
  Administered 2022-01-17: 1000 mg via ORAL
  Filled 2022-01-17: qty 2

## 2022-01-17 MED ORDER — VANCOMYCIN HCL IN DEXTROSE 1-5 GM/200ML-% IV SOLN
1000.0000 mg | INTRAVENOUS | Status: AC
  Administered 2022-01-17: 1000 mg via INTRAVENOUS
  Filled 2022-01-17: qty 200

## 2022-01-17 MED ORDER — LACTATED RINGERS IV SOLN: INTRAVENOUS | Status: DC

## 2022-01-17 MED ORDER — 0.9 % SODIUM CHLORIDE (POUR BTL) OPTIME
TOPICAL | Status: DC | PRN
  Administered 2022-01-17: 1000 mL

## 2022-01-17 MED ORDER — FUROSEMIDE 40 MG PO TABS
80.0000 mg | ORAL_TABLET | Freq: Two times a day (BID) | ORAL | Status: DC
  Administered 2022-01-17 – 2022-01-19 (×4): 80 mg via ORAL
  Filled 2022-01-17 (×4): qty 2

## 2022-01-17 MED ORDER — FAMOTIDINE 20 MG PO TABS: 40.0000 mg | ORAL_TABLET | Freq: Every day | ORAL | Status: DC | PRN

## 2022-01-17 MED ORDER — BUPIVACAINE LIPOSOME 1.3 % IJ SUSP
INTRAMUSCULAR | Status: AC
  Filled 2022-01-17: qty 10

## 2022-01-17 MED ORDER — PHENYLEPHRINE HCL-NACL 20-0.9 MG/250ML-% IV SOLN
INTRAVENOUS | Status: DC | PRN
  Administered 2022-01-17: 20 ug/min via INTRAVENOUS

## 2022-01-17 MED ORDER — MENTHOL 3 MG MT LOZG: 1.0000 | LOZENGE | OROMUCOSAL | Status: DC | PRN

## 2022-01-17 MED ORDER — GABAPENTIN 300 MG PO CAPS
300.0000 mg | ORAL_CAPSULE | Freq: Three times a day (TID) | ORAL | Status: DC
  Administered 2022-01-17 – 2022-01-19 (×8): 300 mg via ORAL
  Filled 2022-01-17 (×8): qty 1

## 2022-01-17 MED ORDER — FENTANYL CITRATE (PF) 100 MCG/2ML IJ SOLN
INTRAMUSCULAR | Status: AC
  Filled 2022-01-17: qty 2

## 2022-01-17 MED ORDER — HYDROCODONE-ACETAMINOPHEN 7.5-325 MG PO TABS: 1.0000 | ORAL_TABLET | ORAL | Status: DC | PRN

## 2022-01-17 MED ORDER — FENTANYL CITRATE PF 50 MCG/ML IJ SOSY: 25.0000 ug | PREFILLED_SYRINGE | INTRAMUSCULAR | Status: DC | PRN

## 2022-01-17 MED ORDER — TRANEXAMIC ACID 1000 MG/10ML IV SOLN
2000.0000 mg | INTRAVENOUS | Status: DC
  Filled 2022-01-17: qty 20

## 2022-01-17 MED ORDER — KETOROLAC TROMETHAMINE 15 MG/ML IJ SOLN
7.5000 mg | Freq: Four times a day (QID) | INTRAMUSCULAR | Status: AC
  Administered 2022-01-17 – 2022-01-18 (×4): 7.5 mg via INTRAVENOUS
  Filled 2022-01-17 (×4): qty 1

## 2022-01-17 MED ORDER — FENTANYL CITRATE (PF) 100 MCG/2ML IJ SOLN
INTRAMUSCULAR | Status: DC | PRN
  Administered 2022-01-17 (×2): 25 ug via INTRAVENOUS

## 2022-01-17 MED ORDER — ACETAMINOPHEN 500 MG PO TABS
500.0000 mg | ORAL_TABLET | Freq: Four times a day (QID) | ORAL | Status: AC
  Administered 2022-01-17 (×2): 500 mg via ORAL
  Filled 2022-01-17 (×3): qty 1

## 2022-01-17 MED ORDER — ONDANSETRON HCL 4 MG/2ML IJ SOLN
INTRAMUSCULAR | Status: DC | PRN
  Administered 2022-01-17: 4 mg via INTRAVENOUS

## 2022-01-17 MED ORDER — METOCLOPRAMIDE HCL 5 MG PO TABS: 5.0000 mg | ORAL_TABLET | Freq: Three times a day (TID) | ORAL | Status: DC | PRN

## 2022-01-17 MED ORDER — DEXAMETHASONE SODIUM PHOSPHATE 10 MG/ML IJ SOLN
INTRAMUSCULAR | Status: AC
  Filled 2022-01-17: qty 1

## 2022-01-17 MED ORDER — DOCUSATE SODIUM 100 MG PO CAPS
100.0000 mg | ORAL_CAPSULE | Freq: Two times a day (BID) | ORAL | Status: DC
  Administered 2022-01-17 – 2022-01-19 (×4): 100 mg via ORAL
  Filled 2022-01-17 (×4): qty 1

## 2022-01-17 MED ORDER — PROPOFOL 500 MG/50ML IV EMUL
INTRAVENOUS | Status: DC | PRN
  Administered 2022-01-17: 50 ug/kg/min via INTRAVENOUS

## 2022-01-17 MED ORDER — PROPOFOL 10 MG/ML IV BOLUS
INTRAVENOUS | Status: AC
  Filled 2022-01-17: qty 20

## 2022-01-17 MED ORDER — ALUM & MAG HYDROXIDE-SIMETH 200-200-20 MG/5ML PO SUSP: 30.0000 mL | ORAL | Status: DC | PRN

## 2022-01-17 MED ORDER — FERROUS SULFATE 325 (65 FE) MG PO TABS
325.0000 mg | ORAL_TABLET | Freq: Every day | ORAL | Status: DC
  Administered 2022-01-18 – 2022-01-19 (×2): 325 mg via ORAL
  Filled 2022-01-17 (×2): qty 1

## 2022-01-17 MED ORDER — PROPOFOL 10 MG/ML IV BOLUS
INTRAVENOUS | Status: DC | PRN
Start: 2022-01-17 — End: 2022-01-17
  Administered 2022-01-17: 30 mg via INTRAVENOUS

## 2022-01-17 MED ORDER — POTASSIUM CHLORIDE CRYS ER 20 MEQ PO TBCR
40.0000 meq | EXTENDED_RELEASE_TABLET | Freq: Once | ORAL | Status: AC
Start: 2022-01-17 — End: 2022-01-17
  Administered 2022-01-17: 40 meq via ORAL
  Filled 2022-01-17: qty 2

## 2022-01-17 MED ORDER — BUPIVACAINE-EPINEPHRINE (PF) 0.25% -1:200000 IJ SOLN
INTRAMUSCULAR | Status: DC | PRN
  Administered 2022-01-17: 30 mL

## 2022-01-17 MED ORDER — TOPIRAMATE 100 MG PO TABS
100.0000 mg | ORAL_TABLET | Freq: Two times a day (BID) | ORAL | Status: DC
  Administered 2022-01-17 – 2022-01-19 (×5): 100 mg via ORAL
  Filled 2022-01-17 (×5): qty 1

## 2022-01-17 MED ORDER — METHOCARBAMOL 500 MG IVPB - SIMPLE MED
500.0000 mg | Freq: Four times a day (QID) | INTRAVENOUS | Status: DC | PRN
  Filled 2022-01-17: qty 50

## 2022-01-17 MED ORDER — AMISULPRIDE (ANTIEMETIC) 5 MG/2ML IV SOLN: 10.0000 mg | Freq: Once | INTRAVENOUS | Status: DC | PRN

## 2022-01-17 MED ORDER — CHLORHEXIDINE GLUCONATE 0.12 % MT SOLN
15.0000 mL | Freq: Once | OROMUCOSAL | Status: AC
  Administered 2022-01-17: 15 mL via OROMUCOSAL

## 2022-01-17 MED ORDER — KETOROLAC TROMETHAMINE 30 MG/ML IJ SOLN
INTRAMUSCULAR | Status: DC | PRN
  Administered 2022-01-17: 15 mg via INTRAVENOUS

## 2022-01-17 MED ORDER — ONDANSETRON HCL 4 MG/2ML IJ SOLN: 4.0000 mg | Freq: Four times a day (QID) | INTRAMUSCULAR | Status: DC | PRN

## 2022-01-17 MED ORDER — MIDAZOLAM HCL 2 MG/2ML IJ SOLN
INTRAMUSCULAR | Status: AC
  Filled 2022-01-17: qty 2

## 2022-01-17 MED ORDER — ISOSORBIDE MONONITRATE ER 30 MG PO TB24
30.0000 mg | ORAL_TABLET | Freq: Every day | ORAL | Status: DC
  Administered 2022-01-17 – 2022-01-18 (×2): 30 mg via ORAL
  Filled 2022-01-17 (×2): qty 1

## 2022-01-17 MED ORDER — BUPIVACAINE-EPINEPHRINE (PF) 0.25% -1:200000 IJ SOLN
INTRAMUSCULAR | Status: AC
  Filled 2022-01-17: qty 30

## 2022-01-17 MED ORDER — ONDANSETRON HCL 4 MG PO TABS: 4.0000 mg | ORAL_TABLET | Freq: Four times a day (QID) | ORAL | Status: DC | PRN

## 2022-01-17 MED ORDER — ORAL CARE MOUTH RINSE: 15.0000 mL | Freq: Once | OROMUCOSAL | Status: AC

## 2022-01-17 MED ORDER — BUPIVACAINE IN DEXTROSE 0.75-8.25 % IT SOLN
INTRATHECAL | Status: DC | PRN
  Administered 2022-01-17: 1.8 mL via INTRATHECAL

## 2022-01-17 MED ORDER — BUPIVACAINE LIPOSOME 1.3 % IJ SUSP
INTRAMUSCULAR | Status: DC | PRN
  Administered 2022-01-17: 10 mL

## 2022-01-17 MED ORDER — TRANEXAMIC ACID-NACL 1000-0.7 MG/100ML-% IV SOLN
1000.0000 mg | INTRAVENOUS | Status: AC
  Administered 2022-01-17: 1000 mg via INTRAVENOUS
  Filled 2022-01-17: qty 100

## 2022-01-17 MED ORDER — MORPHINE SULFATE (PF) 2 MG/ML IV SOLN
0.5000 mg | INTRAVENOUS | Status: DC | PRN
  Administered 2022-01-17: 1 mg via INTRAVENOUS
  Filled 2022-01-17: qty 1

## 2022-01-17 MED ORDER — TRANEXAMIC ACID-NACL 1000-0.7 MG/100ML-% IV SOLN
1000.0000 mg | Freq: Once | INTRAVENOUS | Status: AC
  Administered 2022-01-17: 1000 mg via INTRAVENOUS
  Filled 2022-01-17: qty 100

## 2022-01-17 MED ORDER — HYDROCODONE-ACETAMINOPHEN 5-325 MG PO TABS
1.0000 | ORAL_TABLET | ORAL | Status: DC | PRN
  Administered 2022-01-17 – 2022-01-19 (×8): 2 via ORAL
  Filled 2022-01-17 (×8): qty 2

## 2022-01-17 MED ORDER — ASPIRIN 81 MG PO CHEW
81.0000 mg | CHEWABLE_TABLET | Freq: Two times a day (BID) | ORAL | Status: DC
  Administered 2022-01-18 – 2022-01-19 (×3): 81 mg via ORAL
  Filled 2022-01-17 (×3): qty 1

## 2022-01-17 MED ORDER — VANCOMYCIN HCL IN DEXTROSE 1-5 GM/200ML-% IV SOLN
1000.0000 mg | Freq: Two times a day (BID) | INTRAVENOUS | Status: AC
  Administered 2022-01-17: 1000 mg via INTRAVENOUS
  Filled 2022-01-17: qty 200

## 2022-01-17 MED ORDER — POVIDONE-IODINE 10 % EX SWAB
2.0000 "application " | Freq: Once | CUTANEOUS | Status: AC
  Administered 2022-01-17: 2 via TOPICAL

## 2022-01-17 MED ORDER — TRAMADOL HCL 50 MG PO TABS
50.0000 mg | ORAL_TABLET | Freq: Four times a day (QID) | ORAL | Status: DC
  Administered 2022-01-17 – 2022-01-19 (×9): 50 mg via ORAL
  Filled 2022-01-17 (×9): qty 1

## 2022-01-17 MED ORDER — DIPHENHYDRAMINE HCL 12.5 MG/5ML PO ELIX: 12.5000 mg | ORAL_SOLUTION | ORAL | Status: DC | PRN

## 2022-01-17 MED ORDER — NITROGLYCERIN 0.4 MG SL SUBL: 0.4000 mg | SUBLINGUAL_TABLET | SUBLINGUAL | Status: DC | PRN

## 2022-01-17 MED ORDER — METHOCARBAMOL 500 MG PO TABS
500.0000 mg | ORAL_TABLET | Freq: Four times a day (QID) | ORAL | Status: DC | PRN
  Administered 2022-01-17 – 2022-01-19 (×5): 500 mg via ORAL
  Filled 2022-01-17 (×6): qty 1

## 2022-01-17 MED ORDER — ONDANSETRON HCL 4 MG/2ML IJ SOLN
INTRAMUSCULAR | Status: AC
  Filled 2022-01-17: qty 2

## 2022-01-17 MED ORDER — ALBUMIN HUMAN 5 % IV SOLN: INTRAVENOUS | Status: DC | PRN

## 2022-01-17 MED ORDER — MIDAZOLAM HCL 2 MG/2ML IJ SOLN
INTRAMUSCULAR | Status: DC | PRN
  Administered 2022-01-17: 2 mg via INTRAVENOUS

## 2022-01-17 MED ORDER — TRANEXAMIC ACID 1000 MG/10ML IV SOLN
INTRAVENOUS | Status: DC | PRN
  Administered 2022-01-17: 2000 mg via TOPICAL

## 2022-01-17 MED ORDER — PHENOL 1.4 % MT LIQD: 1.0000 | OROMUCOSAL | Status: DC | PRN

## 2022-01-17 MED ORDER — STERILE WATER FOR IRRIGATION IR SOLN
Status: DC | PRN
Start: 2022-01-17 — End: 2022-01-17
  Administered 2022-01-17: 1000 mL

## 2022-01-17 MED ORDER — BISACODYL 5 MG PO TBEC: 5.0000 mg | DELAYED_RELEASE_TABLET | Freq: Every day | ORAL | Status: DC | PRN

## 2022-01-17 MED ORDER — ACETAMINOPHEN 325 MG PO TABS: 325.0000 mg | ORAL_TABLET | Freq: Four times a day (QID) | ORAL | Status: DC | PRN

## 2022-01-17 SURGICAL SUPPLY — 45 items
BAG COUNTER SPONGE SURGICOUNT (BAG) IMPLANT
BAG DECANTER FOR FLEXI CONT (MISCELLANEOUS) ×2 IMPLANT
BAG SPNG CNTER NS LX DISP (BAG)
BALL HIP CERAMIC 32MM PLUS 9 IMPLANT
BLADE SAW SGTL 18X1.27X75 (BLADE) ×2 IMPLANT
BOOTIES KNEE HIGH SLOAN (MISCELLANEOUS) ×2 IMPLANT
CELLS DAT CNTRL 66122 CELL SVR (MISCELLANEOUS) ×1 IMPLANT
COVER PERINEAL POST (MISCELLANEOUS) ×2 IMPLANT
COVER SURGICAL LIGHT HANDLE (MISCELLANEOUS) ×2 IMPLANT
CUP GRIPTON 48MM 100 HIP (Hips) ×1 IMPLANT
DRAPE FOOT SWITCH (DRAPES) ×2 IMPLANT
DRAPE IMP U-DRAPE 54X76 (DRAPES) ×2 IMPLANT
DRAPE STERI IOBAN 125X83 (DRAPES) ×2 IMPLANT
DRAPE U-SHAPE 47X51 STRL (DRAPES) ×4 IMPLANT
DRSG AQUACEL AG ADV 3.5X 6 (GAUZE/BANDAGES/DRESSINGS) ×2 IMPLANT
DURAPREP 26ML APPLICATOR (WOUND CARE) ×2 IMPLANT
ELECT BLADE TIP CTD 4 INCH (ELECTRODE) ×2 IMPLANT
ELECT REM PT RETURN 15FT ADLT (MISCELLANEOUS) ×2 IMPLANT
ELIMINATOR HOLE APEX DEPUY (Hips) ×1 IMPLANT
GLOVE SRG 8 PF TXTR STRL LF DI (GLOVE) ×2 IMPLANT
GLOVE SURG ENC MOIS LTX SZ8 (GLOVE) ×4 IMPLANT
GLOVE SURG UNDER POLY LF SZ8 (GLOVE) ×4
GOWN STRL REUS W/TWL XL LVL3 (GOWN DISPOSABLE) ×4 IMPLANT
HIP BALL CERAMIC 32MM PLUS 9 ×2 IMPLANT
HOLDER FOLEY CATH W/STRAP (MISCELLANEOUS) ×2 IMPLANT
KIT TURNOVER KIT A (KITS) IMPLANT
MANIFOLD NEPTUNE II (INSTRUMENTS) ×2 IMPLANT
NEEDLE HYPO 22GX1.5 SAFETY (NEEDLE) ×2 IMPLANT
NS IRRIG 1000ML POUR BTL (IV SOLUTION) ×2 IMPLANT
PACK ANTERIOR HIP CUSTOM (KITS) ×2 IMPLANT
PENCIL SMOKE EVACUATOR (MISCELLANEOUS) IMPLANT
PINN ALTRX NEUT ID X OD 32X48 ×1 IMPLANT
PROTECTOR NERVE ULNAR (MISCELLANEOUS) ×2 IMPLANT
RETRACTOR WND ALEXIS 18 MED (MISCELLANEOUS) ×1 IMPLANT
RTRCTR WOUND ALEXIS 18CM MED (MISCELLANEOUS) ×2
SPIKE FLUID TRANSFER (MISCELLANEOUS) ×2 IMPLANT
STEM FEM ACTIS HIGH SZ3 (Stem) ×1 IMPLANT
SUT ETHIBOND NAB CT1 #1 30IN (SUTURE) ×4 IMPLANT
SUT VIC AB 1 CT1 36 (SUTURE) ×2 IMPLANT
SUT VIC AB 2-0 CT1 27 (SUTURE) ×2
SUT VIC AB 2-0 CT1 TAPERPNT 27 (SUTURE) ×1 IMPLANT
SUT VICRYL AB 3-0 FS1 BRD 27IN (SUTURE) ×2 IMPLANT
SUT VLOC 180 0 24IN GS25 (SUTURE) ×2 IMPLANT
SYR 50ML LL SCALE MARK (SYRINGE) ×2 IMPLANT
TRAY FOLEY MTR SLVR 16FR STAT (SET/KITS/TRAYS/PACK) ×2 IMPLANT

## 2022-01-17 NOTE — Op Note (Signed)
PRE-OP DIAGNOSIS:  LEFT HIP DEGENERATIVE JOINT DISEASE POST-OP DIAGNOSIS: same PROCEDURE:  LEFT TOTAL HIP ARTHROPLASTY ANTERIOR APPROACH ANESTHESIA:  Spinal and MAC SURGEON:  Melrose Nakayama MD ASSISTANT:  Loni Dolly PA-C   INDICATIONS FOR PROCEDURE:  The patient is a 61 y.o. female with a long history of a painful hip.  This has persisted despite multiple conservative measures.  The patient has persisted with pain and dysfunction making rest and activity difficult.  A total hip replacement is offered as surgical treatment.  Informed operative consent was obtained after discussion of possible complications including reaction to anesthesia, infection, neurovascular injury, dislocation, DVT, PE, and death.  The importance of the postoperative rehab program to optimize result was stressed with the patient.  SUMMARY OF FINDINGS AND PROCEDURE:  Under the above anesthesia through a anterior approach an the Hana table a left THR was performed.  The patient had severe degenerative change and fair bone quality.  We used DePuy components to replace the hip and these were size 3 high offset Actis femur capped with a +2m hip ball.  On the acetabular side we used a size 48 Gription shell with a plus 4 neutral polyethylene liner.  We did use a hole eliminator.  ALoni DollyPA-C assisted throughout and was invaluable to the completion of the case in that he helped position and retract while I performed the procedure.  He also closed simultaneously to help minimize OR time.  I used fluoroscopy throughout the case to check position of components and leg lengths and read all these views myself.  DESCRIPTION OF PROCEDURE:  The patient was taken to the OR suite where the above anesthetic was applied.  The patient was then positioned on the Hana table supine.  All bony prominences were appropriately padded.  Prep and drape was then performed in normal sterile fashion.  The patient was given vancomycin preoperative  antibiotic and an appropriate time out was performed.  We then took an anterior approach to the left hip.  Dissection was taken through adipose to the tensor fascia lata fascia.  This structure was incised longitudinally and we dissected in the intermuscular interval just medial to this muscle.  Cobra retractors were placed superior and inferior to the femoral neck superficial to the capsule.  A capsular incision was then made and the retractors were placed along the femoral neck.  Xray was brought in to get a good level for the femoral neck cut which was made with an oscillating saw and osteotome.  The femoral head was removed with a corkscrew.  The acetabulum was exposed and some labral tissues were excised. Reaming was taken to the inside wall of the pelvis and sequentially up to 1 mm smaller than the actual component.  A trial of components was done and then the aforementioned acetabular shell was placed in appropriate tilt and anteversion confirmed by fluoroscopy. The liner was placed along with the hole eliminator and attention was turned to the femur.  The leg was brought down and over into adduction and the elevator bar was used to raise the femur up gently in the wound.  The piriformis was released with care taken to preserve the obturator internus attachment and all of the posterior capsule. The femur was reamed and then broached to the appropriate size.  A trial reduction was done and the aforementioned head and neck assembly gave u[place]oreathe best stability in extension with external rotation.  Leg lengths were felt to be about equal by fluoroscopic  exam.  The trial components were removed and the wound irrigated.  We then placed the femoral component in appropriate anteversion.  The head was applied to a dry stem neck and the hip again reduced.  It was again stable in the aforementioned position.  The would was irrigated again followed by re-approximation of anterior capsule with ethibond suture. Tensor  fascia was repaired with V-loc suture  followed by deep closure with #O and #2 undyed vicryl.  Skin was closed with subQ stitch and steristrips followed by a sterile dressing.  EBL and IOF can be obtained from anesthesia records.  DISPOSITION:  The patient was taken to PACU to potentially go home same day depending on ability to walk and tolerate liquids.

## 2022-01-17 NOTE — Evaluation (Signed)
Physical Therapy Evaluation Patient Details Name: Lindsay [name] MRN: [id number] DOB: [birth year] Today's Date: 01/17/2022  History of Present Illness  The patient is a 61 y.o. female with a long history of a painful hip, s/p L THA AA 01/17/22. PMH: diabetes, HTN, arthritis  Clinical Impression  Pt is s/p L THA AA resulting in the deficits listed below (see PT Problem List). Pt from home alone, decreased activity level due to high pain in bil hips (L>R), 1 fall at home but able to get back up, using RW to mobilize limited household distances. Pt currently requiring significant assistance with bed mobility and transfers, max A to come to sitting EOB and power to stand from elevated bed, limited by pain, audible R hip popping. Pt able to take a few sidesteps up to [facility] but unable to progress away from bed and tearful while mobilizing. Pt requires +2 to return to supine and scoot up in bed. Pt tearful and writhing in bed at EOS with ice on hip, RN in room to assess. Pt from home alone with curb entry to apartment, son and daughter-in-law able to deliver groceries but unable to provide further assistance; pt may need ST SNF prior to return home pending progress. Pt will benefit from skilled PT to increase their independence and safety with mobility to allow discharge to the venue listed below.          Recommendations for follow up therapy are one component of a multi-disciplinary discharge planning process, led by the attending physician.  Recommendations may be updated based on patient status, additional functional criteria and insurance authorization.  Follow Up Recommendations Follow physician's recommendations for discharge plan and follow up therapies (may meed SNF pending progress)    Assistance Recommended at Discharge Frequent or constant Supervision/Assistance  Patient can return home with the following  A lot of help with walking and/or transfers;A lot of help with  bathing/dressing/bathroom;Assistance with cooking/housework;Assist for transportation;Help with stairs or ramp for entrance    Equipment Recommendations BSC/3in1  Recommendations for Other Services       Functional Status Assessment       Precautions / Restrictions Precautions Precautions: Fall Precaution Comments: high pain Restrictions Weight Bearing Restrictions: No LLE Weight Bearing: Weight bearing as tolerated      Mobility  Bed Mobility Overal bed mobility: Needs Assistance Bed Mobility: Supine to Sit, Sit to Supine  Supine to sit: Max assist Sit to supine: Max assist, +2 for physical assistance  General bed mobility comments: max A to bring BLE to EOB, significantly increased time and cues due to high pain, requires max A +2 to lift BLE back into bed and reposition to comfort    Transfers Overall transfer level: Needs assistance Equipment used: Rolling walker (2 wheels) Transfers: Sit to/from Stand Sit to Stand: Max assist, From elevated surface  General transfer comment: max A to stand from elevated bed with increased time, remains in squatted position with trunk flexed over RW due to high pain once standing and unable to achieve full upright standing, audible R hip popping noted    Ambulation/Gait Ambulation/Gait assistance: Mod assist  Gait Pattern/deviations: Step-to pattern, Trunk flexed  General Gait Details: pt able to take 2-3 sliding steps towards [facility] with bottom towards bed, keeps trunk flexed over RW despite upright cues, audible R Hip popping noted, pt tearful and limited by pain in bil hips (L>R)  Careers information officer  Modified Rankin (Stroke Patients Only)       Balance Overall balance assessment: Needs assistance Sitting-balance support: Feet supported Sitting balance-Leahy Scale: Fair Sitting balance - Comments: seated EOB, slightly leaned R away from surgical side for comfort  Standing balance support: During  functional activity, Bilateral upper extremity supported, Reliant on assistive device for balance Standing balance-Leahy Scale: Poor       Pertinent Vitals/Pain Pain Assessment Pain Assessment: 0-10 Pain Score: 10-Worst pain ever Pain Location: bil hips (L>R) Pain Descriptors / Indicators: Crying, Discomfort, Grimacing, Guarding, Spasm, Stabbing Pain Intervention(s): Limited activity within patient's tolerance, Monitored during session, Premedicated before session, Repositioned, Ice applied, Patient requesting pain meds-RN notified, Utilized relaxation techniques    Home Living Family/patient expects to be discharged to:: Private residence Living Arrangements: Alone Available Help at Discharge: Family Type of Home: Apartment Home Access: Stairs to enter Entrance Stairs-Rails: None Technical brewer of Steps: curb   Home Layout: One level Home Equipment: Conservation officer, nature (2 wheels)      Prior Function Prior Level of Function : Independent/Modified Independent;Driving  Mobility Comments: pt reports household ambulator, daughter-in-law picks delivers groceries, 1 fall getting out of tub ADLs Comments: pt reports limited with ADLs/IADLs, takes many rest breaks as needed due to pain in bil hips and knees     Hand Dominance        Extremity/Trunk Assessment   Upper Extremity Assessment Upper Extremity Assessment: Overall WFL for tasks assessed    Lower Extremity Assessment Lower Extremity Assessment: Generalized weakness;RLE deficits/detail;LLE deficits/detail RLE Deficits / Details: ankle AROM WNL, quad set active, hip and knee AROM 25% LLE Deficits / Details: ankle AROM WNL, quad set active, hip and knee AROM ~10% LLE Sensation: decreased light touch       Communication   Communication: No difficulties  Cognition Arousal/Alertness: Awake/alert Behavior During Therapy: WFL for tasks assessed/performed Overall Cognitive Status: Within Functional Limits for tasks  assessed     General Comments General comments (skin integrity, edema, etc.): Pt on RA with SpO2 >90%    Exercises     Assessment/Plan    PT Assessment Patient needs continued PT services  PT Problem List Decreased strength;Decreased range of motion;Decreased activity tolerance;Decreased balance;Decreased mobility;Decreased knowledge of use of DME;Decreased safety awareness;Obesity;Pain       PT Treatment Interventions DME instruction;Gait training;Stair training;Functional mobility training;Therapeutic activities;Therapeutic exercise;Balance training;Patient/family education    PT Goals (Current goals can be found in the Care Plan section)  Acute Rehab PT Goals Patient Stated Goal: regain independence PT Goal Formulation: With patient/family Time For Goal Achievement: 01/31/22 Potential to Achieve Goals: Good    Frequency 7X/week     Co-evaluation               AM-PAC PT "6 Clicks" Mobility  Outcome Measure Help needed turning from your back to your side while in a flat bed without using bedrails?: A Lot Help needed moving from lying on your back to sitting on the side of a flat bed without using bedrails?: A Lot Help needed moving to and from a bed to a chair (including a wheelchair)?: Total Help needed standing up from a chair using your arms (e.g., wheelchair or bedside chair)?: Total Help needed to walk in hospital room?: Total Help needed climbing 3-5 steps with a railing? : Total 6 Click Score: 8    End of Session Equipment Utilized During Treatment: Gait belt Activity Tolerance: Patient limited by pain Patient left: in bed;with call bell/phone within reach;with nursing/sitter in  room;with family/visitor present Nurse Communication: Mobility status;Patient requests pain meds PT Visit Diagnosis: Unsteadiness on feet (R26.81);Other abnormalities of gait and mobility (R26.89);Muscle weakness (generalized) (M62.81);Difficulty in walking, not elsewhere classified  (R26.2);Pain Pain - Right/Left: Left Pain - part of body: Hip    Time: 1425-1506 PT Time Calculation (min) (ACUTE ONLY): 41 min   Charges:   PT Evaluation $PT Eval Low Complexity: 1 Low PT Treatments $Therapeutic Activity: 23-37 mins         Tori Ralston Venus PT, DPT 01/17/22, 3:43 PM

## 2022-01-17 NOTE — Anesthesia Postprocedure Evaluation (Signed)
Anesthesia Post Note  Patient: Lindsay [name]  Procedure(s) Performed: LEFT TOTAL HIP ARTHROPLASTY ANTERIOR APPROACH (Left: Hip)     Patient location during evaluation: PACU Anesthesia Type: Spinal Level of consciousness: awake and alert Pain management: pain level controlled Vital Signs Assessment: post-procedure vital signs reviewed and stable Respiratory status: spontaneous breathing and respiratory function stable Cardiovascular status: blood pressure returned to baseline and stable Postop Assessment: spinal receding Anesthetic complications: no   No notable events documented.  Last Vitals:  Vitals:   01/17/22 1030 01/17/22 1118  BP: 101/62 (!) 102/58  Pulse: (!) 57 64  Resp: 12 14  Temp:  (!) 36.3 C  SpO2: 100% 100%    Last Pain:  Vitals:   01/17/22 1118  TempSrc: Oral  PainSc: 0-No pain                 Tiajuana Amass

## 2022-01-17 NOTE — Anesthesia Preprocedure Evaluation (Signed)
Anesthesia Evaluation  Patient identified by MRN, date of birth, ID band Patient awake    Reviewed: Allergy & Precautions, NPO status , Patient's Chart, lab work & pertinent test results  Airway Mallampati: II  TM Distance: >3 FB Neck ROM: Full    Dental  (+) Dental Advisory Given   Pulmonary former smoker,    breath sounds clear to auscultation       Cardiovascular hypertension, Pt. on medications + CAD   Rhythm:Regular Rate:Normal     Neuro/Psych negative neurological ROS     GI/Hepatic negative GI ROS, Neg liver ROS,   Endo/Other  diabetes, Type 2  Renal/GU negative Renal ROS     Musculoskeletal  (+) Arthritis ,   Abdominal   Peds  Hematology negative hematology ROS (+)   Anesthesia Other Findings   Reproductive/Obstetrics                             Lab Results  Component Value Date   WBC 3.8 (L) 01/10/2022   HGB 11.4 (L) 01/10/2022   HCT 37.7 01/10/2022   MCV 80.9 01/10/2022   PLT 310 01/10/2022   Lab Results  Component Value Date   CREATININE 0.72 01/10/2022   BUN 15 01/10/2022   NA 142 01/10/2022   K 3.1 (L) 01/10/2022   CL 106 01/10/2022   CO2 27 01/10/2022    Anesthesia Physical Anesthesia Plan  ASA: 2  Anesthesia Plan: Spinal   Post-op Pain Management: Tylenol PO (pre-op)   Induction:   PONV Risk Score and Plan: 2 and Propofol infusion, Dexamethasone, Ondansetron and Treatment may vary due to age or medical condition  Airway Management Planned: Natural Airway and Simple Face Mask  Additional Equipment:   Intra-op Plan:   Post-operative Plan:   Informed Consent: I have reviewed the patients History and Physical, chart, labs and discussed the procedure including the risks, benefits and alternatives for the proposed anesthesia with the patient or authorized representative who has indicated his/her understanding and acceptance.       Plan Discussed  with: CRNA  Anesthesia Plan Comments:         Anesthesia Quick Evaluation

## 2022-01-17 NOTE — Interval H&P Note (Signed)
History and Physical Interval Note:  01/17/2022 7:29 AM  Lindsay [name]  has presented today for surgery, with the diagnosis of LEFT HIP DEGENERATIVE JOINT DISEASE.  The various methods of treatment have been discussed with the patient and family. After consideration of risks, benefits and other options for treatment, the patient has consented to  Procedure(s): LEFT TOTAL HIP ARTHROPLASTY ANTERIOR APPROACH (Left) as a surgical intervention.  The patient's history has been reviewed, patient examined, no change in status, stable for surgery.  I have reviewed the patient's chart and labs.  Questions were answered to the patient's satisfaction.     Velna Ochs

## 2022-01-17 NOTE — Anesthesia Procedure Notes (Signed)
Spinal  Patient location during procedure: OR Start time: 01/17/2022 7:41 AM Reason for block: surgical anesthesia Staffing Performed: resident/CRNA  Anesthesiologist: Suzette Battiest, MD Resident/CRNA: Eben Burow, CRNA Preanesthetic Checklist Completed: patient identified, IV checked, site marked, risks and benefits discussed, surgical consent, monitors and equipment checked, pre-op evaluation and timeout performed Spinal Block Patient position: sitting Prep: DuraPrep and site prepped and draped Patient monitoring: heart rate, cardiac monitor, continuous pulse ox and blood pressure Approach: midline Location: L3-4 Injection technique: single-shot Needle Needle type: Pencan  Needle gauge: 24 G Needle length: 10 cm Assessment Events: CSF return Additional Notes Pt placed in sitting position, spinal kit expiration date checked and verified, + CSF, - heme, pt tolerated well. Adequate sensory level. Dr Ola Spurr present and supervising throughout SAB placement.

## 2022-01-17 NOTE — Plan of Care (Signed)

## 2022-01-17 NOTE — Transfer of Care (Signed)
Immediate Anesthesia Transfer of Care Note  Patient: Lindsay [name]  Procedure(s) Performed: LEFT TOTAL HIP ARTHROPLASTY ANTERIOR APPROACH (Left: Hip)  Patient Location: PACU  Anesthesia Type:Spinal  Level of Consciousness: awake, alert  and patient cooperative  Airway & Oxygen Therapy: Patient Spontanous Breathing and Patient connected to face mask oxygen  Post-op Assessment: Report given to RN and Post -op Vital signs reviewed and stable  Post vital signs: Reviewed and stable  Last Vitals:  Vitals Value Taken Time  BP 105/55 01/17/22 0951  Temp    Pulse    Resp 10 01/17/22 0953  SpO2    Vitals shown include unvalidated device data.  Last Pain:  Vitals:   01/17/22 0637  TempSrc: Oral         Complications: No notable events documented.

## 2022-01-17 NOTE — Anesthesia Procedure Notes (Signed)
Procedure Name: MAC Date/Time: 01/17/2022 7:34 AM Performed by: Eben Burow, CRNA Pre-anesthesia Checklist: Patient identified, Emergency Drugs available, Suction available, Patient being monitored and Timeout performed Oxygen Delivery Method: Simple face mask Placement Confirmation: positive ETCO2

## 2022-01-18 DIAGNOSIS — M1612 Unilateral primary osteoarthritis, left hip: Secondary | ICD-10-CM | POA: Diagnosis not present

## 2022-01-18 LAB — BASIC METABOLIC PANEL
Anion gap: 7 (ref 5–15)
BUN: 11 mg/dL (ref 8–23)
CO2: 25 mmol/L (ref 22–32)
Calcium: 8.3 mg/dL — ABNORMAL LOW (ref 8.9–10.3)
Chloride: 106 mmol/L (ref 98–111)
Creatinine, Ser: 0.67 mg/dL (ref 0.44–1.00)
GFR, Estimated: >60 mL/min (ref >60)
Glucose, Bld: 113 mg/dL — ABNORMAL HIGH (ref 70–99)
Potassium: 3.5 mmol/L (ref 3.5–5.1)
Sodium: 138 mmol/L (ref 135–145)

## 2022-01-18 LAB — CBC
HCT: 29.5 % — ABNORMAL LOW (ref 36.0–46.0)
Hemoglobin: 8.7 g/dL — ABNORMAL LOW (ref 12.0–15.0)
MCH: 24.2 pg — ABNORMAL LOW (ref 26.0–34.0)
MCHC: 29.5 g/dL — ABNORMAL LOW (ref 30.0–36.0)
MCV: 82.2 fL (ref 80.0–100.0)
Platelets: 220 10*3/uL (ref 150–400)
RBC: 3.59 MIL/uL — ABNORMAL LOW (ref 3.87–5.11)
RDW: 16.3 % — ABNORMAL HIGH (ref 11.5–15.5)
WBC: 5 10*3/uL (ref 4.0–10.5)
nRBC: 0 % (ref 0.0–0.2)

## 2022-01-18 NOTE — Progress Notes (Signed)
Physical Therapy Treatment Patient Details Name: Lindsay [name] MRN: [id number] DOB: 25-Feb-1961 Today's Date: 01/18/2022   History of Present Illness The patient is a 61 y.o. female with a long history of a painful hip, s/p L THA AA 01/17/22. PMH: diabetes, HTN, arthritis    PT Comments    POD # 1 am session General Comments: AxO x 3 works for [employer], pleasant and motivated Assisted OOB and amb required increased time.  General bed mobility comments: demonstrated and instructed how to use belt to self asisst LE off bed  General transfer comment: increased time and elevated bed pt still required + 2 Mod Assist with audible crepitus R hip.General Gait Details: very limited amb distance due to B hip pain and effort.  Recliner following for safety. Then returned to room to perform some TE's following HEP handout.  Instructed on proper tech, freq as well as use of ICE.   Pt will need another PT session this afternoon.    Recommendations for follow up therapy are one component of a multi-disciplinary discharge planning process, led by the attending physician.  Recommendations may be updated based on patient status, additional functional criteria and insurance authorization.  Follow Up Recommendations  Follow physician's recommendations for discharge plan and follow up therapies     Assistance Recommended at Discharge Frequent or constant Supervision/Assistance  Patient can return home with the following A lot of help with walking and/or transfers;A lot of help with bathing/dressing/bathroom;Assistance with cooking/housework;Assist for transportation;Help with stairs or ramp for entrance   Equipment Recommendations  BSC/3in1    Recommendations for Other Services       Precautions / Restrictions Precautions Precautions: Fall Precaution Comments: "other" hip has arthritis Restrictions Weight Bearing Restrictions: No LLE Weight Bearing: Weight bearing as tolerated     Mobility  Bed  Mobility Overal bed mobility: Needs Assistance Bed Mobility: Supine to Sit     Supine to sit: Mod assist     General bed mobility comments: demonstrated and instructed how to use belt to self asisst LE off bed    Transfers Overall transfer level: Needs assistance Equipment used: Rolling walker (2 wheels) Transfers: Sit to/from Stand Sit to Stand: Mod assist           General transfer comment: increased time and elevated bed pt still required + 2 Mod Assist with audible crepitus R hip.    Ambulation/Gait Ambulation/Gait assistance: Mod assist, +2 physical assistance, +2 safety/equipment Gait Distance (Feet): 15 Feet Assistive device: Rolling walker (2 wheels) Gait Pattern/deviations: Step-to pattern, Trunk flexed Gait velocity: decreased     General Gait Details: very limited amb distance due to B hip pain and effort.  Recliner following for safety.   Stairs             Wheelchair Mobility    Modified Rankin (Stroke Patients Only)       Balance                                            Cognition Arousal/Alertness: Awake/alert Behavior During Therapy: WFL for tasks assessed/performed Overall Cognitive Status: Within Functional Limits for tasks assessed                                 General Comments: AxO x 3 works for [employer],  pleasant and motivated        Exercises  Total Hip Replacement TE's following HEP Handout 10 reps ankle pumps 05 reps knee presses 05 reps heel slides 05 reps SAQ's 05 reps ABD Instructed how to use a belt loop to assist  Followed by ICE     General Comments        Pertinent Vitals/Pain Pain Assessment Pain Assessment: 0-10 Pain Score: 7  Pain Location: bil hips (L>R) Pain Descriptors / Indicators: Grimacing, Discomfort, Operative site guarding, Constant Pain Intervention(s): Monitored during session, Repositioned, Premedicated before session, Ice applied    Home Living                           Prior Function            PT Goals (current goals can now be found in the care plan section) Progress towards PT goals: Progressing toward goals    Frequency    7X/week      PT Plan Current plan remains appropriate    Co-evaluation              AM-PAC PT "6 Clicks" Mobility   Outcome Measure  Help needed turning from your back to your side while in a flat bed without using bedrails?: A Lot Help needed moving from lying on your back to sitting on the side of a flat bed without using bedrails?: A Lot Help needed moving to and from a bed to a chair (including a wheelchair)?: A Lot Help needed standing up from a chair using your arms (e.g., wheelchair or bedside chair)?: A Lot Help needed to walk in hospital room?: A Lot Help needed climbing 3-5 steps with a railing? : Total 6 Click Score: 11    End of Session Equipment Utilized During Treatment: Gait belt Activity Tolerance: Patient limited by pain;Patient limited by fatigue Patient left: in chair;with call bell/phone within reach;with chair alarm set Nurse Communication: Mobility status;Patient requests pain meds PT Visit Diagnosis: Unsteadiness on feet (R26.81);Other abnormalities of gait and mobility (R26.89);Muscle weakness (generalized) (M62.81);Difficulty in walking, not elsewhere classified (R26.2);Pain Pain - Right/Left: Left Pain - part of body: Hip     Time: 8937-3428 PT Time Calculation (min) (ACUTE ONLY): 25 min  Charges:  $Gait Training: 8-22 mins $Therapeutic Exercise: 8-22 mins                     Felecia Shelling  PTA Acute  Rehabilitation Services Pager      [phone] Office      [phone]

## 2022-01-18 NOTE — Progress Notes (Signed)
Subjective: 1 Day Post-Op Procedure(s) (LRB): LEFT TOTAL HIP ARTHROPLASTY ANTERIOR APPROACH (Left)  Patient is resting comfortably in bed this morning. Her right hip is bothering her more than her left now. She states that she hired help that will be available on3wbat Friday at home.  Activity level:  wbat Diet tolerance:  ok Voiding:  ok Patient reports pain as mild and moderate.    Objective: Vital signs in last 24 hours: Temp:  [97.4 F (36.3 C)-98.5 F (36.9 C)] 97.9 F (36.6 C) (02/08 0554) Pulse Rate:  [57-78] 72 (02/08 0554) Resp:  [10-20] 20 (02/08 0554) BP: (101-132)/(53-86) 118/56 (02/08 0554) SpO2:  [98 %-100 %] 100 % (02/07 2147)  Labs: Recent Labs    01/18/22 0314  HGB 8.7*   Recent Labs    01/18/22 0314  WBC 5.0  RBC 3.59*  HCT 29.5*  PLT 220   Recent Labs    01/18/22 0314  NA 138  K 3.5  CL 106  CO2 25  BUN 11  CREATININE 0.67  GLUCOSE 113*  CALCIUM 8.3*   No results for input(s): LABPT, INR in the last 72 hours.  Physical Exam:  Neurologically intact ABD soft Neurovascular intact Sensation intact distally Intact pulses distally Dorsiflexion/Plantar flexion intact Incision: dressing C/D/I and no drainage No cellulitis present Compartment soft  Assessment/Plan:  1 Day Post-Op Procedure(s) (LRB): LEFT TOTAL HIP ARTHROPLASTY ANTERIOR APPROACH (Left) Advance diet Up with therapy Discharge home with home health likely Thursday as long as she does well and progresses with PT. Her right hip is now limiting her more than her replaced left hip. Continue on 81mg  ASA BID for dvt prevention. Follow up in office 2 weeks post op.  Lindsay [name] 01/18/2022, 8:13 AM

## 2022-01-18 NOTE — TOC Transition Note (Signed)
Transition of Care [facility]) - CM/SW Discharge Note   Patient Details  Name: Lindsay [name] MRN: [id number] Date of Birth: Mar 06, 1961  Transition of Care [facility]) CM/SW Contact:  Lennart Pall, LCSW Phone Number: 01/18/2022, 11:08 AM   Clinical Narrative:    Met with pt and confirming she has all needed DME at home.  She reports she is already set up for OPPT at [facility].  No further TOC needs.   Final next level of care: OP Rehab Barriers to Discharge: No Barriers Identified   Patient Goals and CMS Choice Patient states their goals for this hospitalization and ongoing recovery are:: return home      Discharge Placement                       Discharge Plan and Services                DME Arranged: N/A DME Agency: NA                  Social Determinants of Health (SDOH) Interventions     Readmission Risk Interventions No flowsheet data found.

## 2022-01-18 NOTE — Progress Notes (Signed)
Physical Therapy Treatment Patient Details Name: Lindsay [name] MRN: [id number] DOB: 07/18/1961 Today's Date: 01/18/2022   History of Present Illness The patient is a 61 y.o. female with a long history of a painful hip, s/p L THA AA 01/17/22. PMH: diabetes, HTN, arthritis    PT Comments    POD # 1 pm session Assisted with amb.  General transfer comment: increased time and elevated bed pt still required + 2 Min Assist with audible crepitus R hip. General Gait Details: tolerated amb in hallway 55 feet with increased time and effort but pt was determined to do it. Nearly 8 min to complete then an extended seated rest break EOB.  Assisted back to bed.  General bed mobility comments: Max Assist to support B LE up onto bed.  Positioned to comfort and applied ICE. Pt did NOT meet her PT goals to safely D/C to home today.  Pt having issues of pain control and limited ability to lift L LE enough to attempt stairs.     Recommendations for follow up therapy are one component of a multi-disciplinary discharge planning process, led by the attending physician.  Recommendations may be updated based on patient status, additional functional criteria and insurance authorization.  Follow Up Recommendations  Follow physician's recommendations for discharge plan and follow up therapies     Assistance Recommended at Discharge Frequent or constant Supervision/Assistance  Patient can return home with the following A lot of help with walking and/or transfers;A lot of help with bathing/dressing/bathroom;Assistance with cooking/housework;Assist for transportation;Help with stairs or ramp for entrance   Equipment Recommendations  BSC/3in1    Recommendations for Other Services       Precautions / Restrictions Precautions Precautions: Fall Precaution Comments: "other" hip has arthritis Restrictions Weight Bearing Restrictions: No LLE Weight Bearing: Weight bearing as tolerated     Mobility  Bed Mobility Overal  bed mobility: Needs Assistance Bed Mobility: Sit to Supine     Supine to sit: Mod assist Sit to supine: Max assist, +2 for physical assistance   General bed mobility comments: Max Assist to support B LE up onto bed    Transfers Overall transfer level: Needs assistance Equipment used: Rolling walker (2 wheels) Transfers: Sit to/from Stand Sit to Stand: Min assist           General transfer comment: increased time and elevated bed pt still required + 2 Min Assist with audible crepitus R hip.    Ambulation/Gait Ambulation/Gait assistance: Min assist, Min guard Gait Distance (Feet): 55 Feet Assistive device: Rolling walker (2 wheels) Gait Pattern/deviations: Step-to pattern, Trunk flexed Gait velocity: decreased     General Gait Details: tolerated amb in hallway 55 feet with increased time and effort but pt was determined to do it.   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[employer], Metallurgist Comments        Pertinent Vitals/Pain Pain Assessment Pain Assessment: 0-10 Pain Score: 8  Pain Location: bil hips (L>R) Pain Descriptors / Indicators: Grimacing, Discomfort, Operative site guarding, Constant Pain Intervention(s): Monitored during session, Premedicated before session, Repositioned, Ice applied    Home Living                          Prior Function            PT Goals (current goals can now be found in the care plan section) Progress towards PT goals: Progressing toward goals    Frequency    7X/week      PT  Plan Current plan remains appropriate    Co-evaluation              AM-PAC PT "6 Clicks" Mobility   Outcome Measure  Help needed turning from your back to your side while in a flat bed without using bedrails?: A Lot Help needed moving from lying on your back to sitting on the side of a flat bed without using bedrails?: A Lot Help needed moving to and from a bed to a chair (including a wheelchair)?: A Lot Help needed standing up from a chair using your arms (e.g., wheelchair or bedside chair)?: A Lot Help needed to walk in hospital room?: A Lot Help needed climbing 3-5 steps with a railing? : Total 6 Click Score: 11    End of Session Equipment Utilized During Treatment: Gait belt Activity Tolerance: Patient limited by pain;Patient limited by fatigue Patient left: in bed;with bed alarm set;with call bell/phone within reach Nurse Communication: Mobility status;Patient requests pain meds PT Visit Diagnosis: Unsteadiness on feet (R26.81);Other abnormalities of gait and mobility (R26.89);Muscle weakness (generalized) (M62.81);Difficulty in walking, not elsewhere classified (R26.2);Pain Pain - Right/Left: Left Pain - part of body: Hip     Time: 1442-1510 PT Time Calculation (min) (ACUTE ONLY): 28 min  Charges:  $Gait Training: 8-22 mins $Therapeutic Exercise: 8-22 mins                     Felecia Shelling  PTA Acute  Rehabilitation Services Pager      [phone] Office      [phone]

## 2022-01-18 NOTE — Plan of Care (Signed)
  Problem: Activity: Goal: Ability to tolerate increased activity will improve Outcome: Progressing   Problem: Pain Management: Goal: Pain level will decrease with appropriate interventions Outcome: Progressing   

## 2022-01-19 DIAGNOSIS — M1612 Unilateral primary osteoarthritis, left hip: Secondary | ICD-10-CM | POA: Diagnosis not present

## 2022-01-19 LAB — CBC
HCT: 28.7 % — ABNORMAL LOW (ref 36.0–46.0)
Hemoglobin: 8.6 g/dL — ABNORMAL LOW (ref 12.0–15.0)
MCH: 24.6 pg — ABNORMAL LOW (ref 26.0–34.0)
MCHC: 30 g/dL (ref 30.0–36.0)
MCV: 82.2 fL (ref 80.0–100.0)
Platelets: 222 10*3/uL (ref 150–400)
RBC: 3.49 MIL/uL — ABNORMAL LOW (ref 3.87–5.11)
RDW: 16.2 % — ABNORMAL HIGH (ref 11.5–15.5)
WBC: 5.6 10*3/uL (ref 4.0–10.5)
nRBC: 0 % (ref 0.0–0.2)

## 2022-01-19 MED ORDER — HYDROCODONE-ACETAMINOPHEN 7.5-325 MG PO TABS: 1.0000 | ORAL_TABLET | Freq: Four times a day (QID) | ORAL | 0 refills | Status: DC | PRN

## 2022-01-19 MED ORDER — TIZANIDINE HCL 4 MG PO TABS: 4.0000 mg | ORAL_TABLET | Freq: Four times a day (QID) | ORAL | 1 refills | Status: DC | PRN

## 2022-01-19 MED ORDER — ASPIRIN 81 MG PO TBEC: 81.0000 mg | DELAYED_RELEASE_TABLET | Freq: Two times a day (BID) | ORAL | 0 refills | Status: DC

## 2022-01-19 NOTE — Progress Notes (Signed)
Subjective: 2 Days Post-Op Procedure(s) (LRB): LEFT TOTAL HIP ARTHROPLASTY ANTERIOR APPROACH (Left)  Patient is feeling better this morning. Lindsay [name] is hoping to go home this afternoon once her son gets off work.  Activity level:  wbat Diet tolerance:  ok Voiding:  ok Patient reports pain as mild.    Objective: Vital signs in last 24 hours: Temp:  [98 F (36.7 C)-98.5 F (36.9 C)] 98.4 F (36.9 C) (02/09 0506) Pulse Rate:  [81-85] 82 (02/09 0506) Resp:  [17-18] 17 (02/09 0506) BP: (106-118)/(57-68) 109/61 (02/09 0506) SpO2:  [95 %-98 %] 95 % (02/09 0506)  Labs: Recent Labs    01/18/22 0314 01/19/22 0309  HGB 8.7* 8.6*   Recent Labs    01/18/22 0314 01/19/22 0309  WBC 5.0 5.6  RBC 3.59* 3.49*  HCT 29.5* 28.7*  PLT 220 222   Recent Labs    01/18/22 0314  NA 138  K 3.5  CL 106  CO2 25  BUN 11  CREATININE 0.67  GLUCOSE 113*  CALCIUM 8.3*   No results for input(s): LABPT, INR in the last 72 hours.  Physical Exam:  Neurologically intact ABD soft Neurovascular intact Sensation intact distally Intact pulses distally Dorsiflexion/Plantar flexion intact Incision: dressing C/D/I and no drainage No cellulitis present Compartment soft  Assessment/Plan:  2 Days Post-Op Procedure(s) (LRB): LEFT TOTAL HIP ARTHROPLASTY ANTERIOR APPROACH (Left) Advance diet Up with therapy D/C IV fluids Discharge home with home health today if cleared by PT this afternoon. Follow up in office 2 weeks post op.Continue on 81mg  ASA BID for dvt prevention.   Larwance Sachs Volney Reierson 01/19/2022, 6:57 AM

## 2022-01-19 NOTE — Plan of Care (Signed)

## 2022-01-19 NOTE — Progress Notes (Signed)
Physical Therapy Treatment Patient Details Name: Lindsay [name] MRN: [id number] DOB: 06/14/1961 Today's Date: 01/19/2022   History of Present Illness The patient is a 61 y.o. female with a long history of a painful hip, s/p L THA AA 01/17/22. PMH: diabetes, HTN, arthritis    PT Comments    Pt is progressing quite well with mobility, she tolerated an increased ambulation distance of 150'. She completed stair training and demonstrates good understanding of HEP. She is ready to DC home from a PT standpoint.    Recommendations for follow up therapy are one component of a multi-disciplinary discharge planning process, led by the attending physician.  Recommendations may be updated based on patient status, additional functional criteria and insurance authorization.  Follow Up Recommendations  Follow physician's recommendations for discharge plan and follow up therapies     Assistance Recommended at Discharge Intermittent Supervision/Assistance  Patient can return home with the following Assistance with cooking/housework;Assist for transportation;Help with stairs or ramp for entrance;A little help with walking and/or transfers;A little help with bathing/dressing/bathroom   Equipment Recommendations  BSC/3in1    Recommendations for Other Services       Precautions / Restrictions Precautions Precautions: Fall Precaution Comments: "other" hip has arthritis Restrictions Weight Bearing Restrictions: No LLE Weight Bearing: Weight bearing as tolerated     Mobility  Bed Mobility Overal bed mobility: Needs Assistance Bed Mobility: Supine to Sit     Supine to sit: Mod assist, HOB elevated     General bed mobility comments: mod A to pivot hips to EOB    Transfers Overall transfer level: Needs assistance Equipment used: Rolling walker (2 wheels) Transfers: Sit to/from Stand Sit to Stand: Min guard, From elevated surface           General transfer comment: increased time and elevated  bed    Ambulation/Gait Ambulation/Gait assistance: Min guard Gait Distance (Feet): 150 Feet Assistive device: Rolling walker (2 wheels) Gait Pattern/deviations: Step-to pattern, Decreased step length - right, Decreased step length - left Gait velocity: decreased     General Gait Details: tolerated increased distance, pt reports R thigh pain is more bothersome than L surgical hip pain. No loss of balance   Stairs Stairs: Yes Stairs assistance: Min assist Stair Management: No rails, Backwards, With walker Number of Stairs: 1 General stair comments: VCs sequencing, min A to stabilize RW   Wheelchair Mobility    Modified Rankin (Stroke Patients Only)       Balance Overall balance assessment: Needs assistance Sitting-balance support: Feet supported Sitting balance-Leahy Scale: Fair Sitting balance - Comments: seated EOB, slightly leaned R away from surgical side for comfort   Standing balance support: During functional activity, Bilateral upper extremity supported, Reliant on assistive device for balance Standing balance-Leahy Scale: Poor                              Cognition Arousal/Alertness: Awake/alert Behavior During Therapy: WFL for tasks assessed/performed Overall Cognitive Status: Within Functional Limits for tasks assessed                                 General Comments: 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, civil (consulting) for [employer], pleasant and motivated        Exercises Total Joint Exercises Ankle Circles/Pumps: AROM, Both, 10 reps, Supine Quad Sets: AROM, Left, 5 reps, Supine Short Arc Quad: AROM, Left,  5 reps, Supine Heel Slides: AAROM, Left, 5 reps, Supine Hip ABduction/ADduction: AAROM, Left, 5 reps, Supine Long Arc Quad: AROM, Left, 5 reps, Seated    General Comments        Pertinent Vitals/Pain Pain Assessment Pain Assessment: Faces Faces Pain Scale: Hurts even more Pain Location: bil hips (R more than L) Pain Descriptors /  Indicators: Grimacing, Discomfort, Operative site guarding, Constant Pain Intervention(s): Limited activity within patient's tolerance, Monitored during session, Premedicated before session, Ice applied    Home Living                          Prior Function            PT Goals (current goals can now be found in the care plan section) Acute Rehab PT Goals Patient Stated Goal: regain independence, be able to go on road trips PT Goal Formulation: With patient Time For Goal Achievement: 01/31/22 Potential to Achieve Goals: Good Progress towards PT goals: Progressing toward goals    Frequency    7X/week      PT Plan Current plan remains appropriate    Co-evaluation              AM-PAC PT "6 Clicks" Mobility   Outcome Measure  Help needed turning from your back to your side while in a flat bed without using bedrails?: A Little Help needed moving from lying on your back to sitting on the side of a flat bed without using bedrails?: A Lot Help needed moving to and from a bed to a chair (including a wheelchair)?: A Little Help needed standing up from a chair using your arms (e.g., wheelchair or bedside chair)?: A Little Help needed to walk in hospital room?: A Little Help needed climbing 3-5 steps with a railing? : A Little 6 Click Score: 17    End of Session Equipment Utilized During Treatment: Gait belt Activity Tolerance: Patient tolerated treatment well Patient left: in chair;with chair alarm set;with call bell/phone within reach Nurse Communication: Mobility status PT Visit Diagnosis: Unsteadiness on feet (R26.81);Other abnormalities of gait and mobility (R26.89);Muscle weakness (generalized) (M62.81);Difficulty in walking, not elsewhere classified (R26.2);Pain Pain - Right/Left: Right Pain - part of body: Hip     Time: 0240-9735 PT Time Calculation (min) (ACUTE ONLY): 57 min  Charges:  $Gait Training: 23-37 mins $Therapeutic Exercise: 8-22  mins $Therapeutic Activity: 8-22 mins                    Ralene Bathe Kistler PT 01/19/2022  Acute Rehabilitation Services Pager [phone] Office [phone]

## 2022-01-19 NOTE — Progress Notes (Signed)
Provided discharge education/instructions to Pt and son, all questions and concerns addressed. Pt not in acute distress, discharged home with belongings accompanied by son.

## 2022-01-19 NOTE — Discharge Summary (Signed)
Patient ID: Lindsay [name] MRN: [id number] DOB/AGE: 62-30-1962 62 y.o.  Admit date: 01/17/2022 Discharge date: 01/19/2022  Admission Diagnoses:  Principal Problem:   Primary localized osteoarthritis of left hip Active Problems:   Primary osteoarthritis of left hip   Discharge Diagnoses:  Same  Past Medical History:  Diagnosis Date   Allergic rhinitis, cause unspecified    seasonal   Anginal pain (HCC) 2013   Stress   Arthritis    hips, thumb,knees   Diabetes mellitus without complication (HCC)    Essential hypertension, benign 12/12/2011   Joint pain    Multiple drug allergies    Obesity    Wears glasses    Wears partial dentures    upper    Surgeries: Procedure(s): LEFT TOTAL HIP ARTHROPLASTY ANTERIOR APPROACH on 01/17/2022   Consultants:   Discharged Condition: Improved  Hospital Course: Lindsay [name] is an 61 y.o. female who was admitted 01/17/2022 for operative treatment ofPrimary localized osteoarthritis of left hip. Patient has severe unremitting pain that affects sleep, daily activities, and work/hobbies. After pre-op clearance the patient was taken to the operating room on 01/17/2022 and underwent  Procedure(s): LEFT TOTAL HIP ARTHROPLASTY ANTERIOR APPROACH.    Patient was given perioperative antibiotics:  Anti-infectives (From admission, onward)    Start     Dose/Rate Route Frequency Ordered Stop   01/17/22 1830  vancomycin (VANCOCIN) IVPB 1000 mg/200 mL premix        1,000 mg 200 mL/hr over 60 Minutes Intravenous Every 12 hours 01/17/22 1108 01/17/22 1935   01/17/22 0600  vancomycin (VANCOCIN) IVPB 1000 mg/200 mL premix        1,000 mg 200 mL/hr over 60 Minutes Intravenous On call to O.R. 01/17/22 [id number] 01/17/22 0729        Patient was given sequential compression devices, early ambulation, and chemoprophylaxis to prevent DVT.  Patient benefited maximally from hospital stay and there were no complications.    Recent vital signs: Patient Vitals for the past  24 hrs:  BP Temp Temp src Pulse Resp SpO2  01/19/22 0506 109/61 98.4 F (36.9 C) Oral 82 17 95 %  01/18/22 2135 (!) 115/58 98.5 F (36.9 C) Oral 84 17 95 %  01/18/22 1258 106/68 98 F (36.7 C) Oral 81 18 98 %  01/18/22 0903 (!) 118/57 98.2 F (36.8 C) Oral 85 18 98 %     Recent laboratory studies:  Recent Labs    01/18/22 0314 01/19/22 0309  WBC 5.0 5.6  HGB 8.7* 8.6*  HCT 29.5* 28.7*  PLT 220 222  NA 138  --   K 3.5  --   CL 106  --   CO2 25  --   BUN 11  --   CREATININE 0.67  --   GLUCOSE 113*  --   CALCIUM 8.3*  --      Discharge Medications:   Allergies as of 01/19/2022       Reactions   Latex Anaphylaxis   Depending on how long pt is touched with latex items   Penicillins Anaphylaxis   Prednisone Swelling   Sulfa Antibiotics Hives   Ceftin [cefuroxime Axetil] Rash   Tolerates Keflex   Doxycycline Rash   Other Rash   ALL MYCINS        Medication List     STOP taking these medications    diclofenac 75 MG EC tablet Commonly known as: VOLTAREN   metaxalone 800 MG tablet Commonly known as: SKELAXIN  TAKE these medications    acetaminophen 650 MG CR tablet Commonly known as: TYLENOL Take 1,300 mg by mouth every 8 (eight) hours as needed for pain.   aspirin 81 MG EC tablet Take 1 tablet (81 mg total) by mouth 2 (two) times daily after a meal. For 2 weeks then once a day after that for DVT prevention. What changed:  when to take this additional instructions   azelastine 0.1 % nasal spray Commonly known as: ASTELIN Place 1 spray into both nostrils 2 (two) times daily. Use in each nostril as directed   famotidine 40 MG tablet Commonly known as: PEPCID Take 40 mg by mouth daily as needed for heartburn or indigestion.   ferrous sulfate 325 (65 FE) MG tablet Take 325 mg by mouth daily with breakfast.   furosemide 40 MG tablet Commonly known as: LASIX Take 80 mg by mouth 2 (two) times daily.   gabapentin 300 MG capsule Commonly  known as: NEURONTIN Take 300 mg by mouth 3 (three) times daily.   HYDROcodone-acetaminophen 7.5-325 MG tablet Commonly known as: NORCO Take 1-2 tablets by mouth every 6 (six) hours as needed for severe pain (post op pain).   isosorbide mononitrate 30 MG 24 hr tablet Commonly known as: IMDUR Take 30 mg by mouth at bedtime.   lidocaine 5 % Commonly known as: LIDODERM Place 1 patch onto the skin every 12 (twelve) hours.   Mounjaro 5 MG/0.5ML Pen Generic drug: tirzepatide Inject 5 mg into the skin once a week.   multivitamin tablet Take 1 tablet by mouth daily.   nitroGLYCERIN 0.4 MG SL tablet Commonly known as: NITROSTAT Place 1 tablet (0.4 mg total) under the tongue every 5 (five) minutes as needed for chest pain.   potassium chloride SA 20 MEQ tablet Commonly known as: KLOR-CON M Take 40 mEq by mouth once.   tiZANidine 4 MG tablet Commonly known as: Zanaflex Take 1 tablet (4 mg total) by mouth every 6 (six) hours as needed for muscle spasms.   topiramate 100 MG tablet Commonly known as: TOPAMAX Take 100 mg by mouth 2 (two) times daily.               Durable Medical Equipment  (From admission, onward)           Start     Ordered   01/17/22 1109  DME Walker rolling  Once       Question:  Patient needs a walker to treat with the following condition  Answer:  Primary osteoarthritis of left hip   01/17/22 1108   01/17/22 1109  DME 3 n 1  Once        01/17/22 1108   01/17/22 1109  DME Bedside commode  Once       Question:  Patient needs a bedside commode to treat with the following condition  Answer:  Primary osteoarthritis of left hip   01/17/22 1108            Diagnostic Studies: DG Chest 2 View  Result Date: 01/10/2022 CLINICAL DATA:  Preoperative. EXAM: CHEST - 2 VIEW COMPARISON:  Chest x-ray 11/09/2018. CT chest 11/12/2018. FINDINGS: The heart size and mediastinal contours are within normal limits. Large hiatal hernia again seen. Both lungs are  clear. The visualized skeletal structures are unremarkable. IMPRESSION: 1. No acute cardiopulmonary process. 2. Stable large hiatal hernia. Electronically Signed   By: Darliss Cheney M.D.   On: 01/10/2022 23:33   DG C-Arm 1-60 Min-No Report  Result Date: 01/17/2022 Fluoroscopy was utilized by the requesting physician.  No radiographic interpretation.   DG C-Arm 1-60 Min-No Report  Result Date: 01/17/2022 Fluoroscopy was utilized by the requesting physician.  No radiographic interpretation.   DG HIP OPERATIVE UNILAT W OR W/O PELVIS LEFT  Result Date: 01/17/2022 CLINICAL DATA:  Left hip replacement EXAM: OPERATIVE LEFT  HIP (WITH PELVIS IF PERFORMED)  VIEWS TECHNIQUE: Fluoroscopic spot image(s) were submitted for interpretation post-operatively. COMPARISON:  10/13/2020, report only FINDINGS: Three intraoperative views. These demonstrate placement of a left hip arthroplasty, without acute hardware complication or periprosthetic fracture. Incompletely imaged native right hip. IMPRESSION: Intraoperative imaging of left hip arthroplasty. Electronically Signed   By: Jeronimo GreavesKyle  Talbot M.D.   On: 01/17/2022 09:28    Disposition: Discharge disposition: 01-Home or Self Care       Discharge Instructions     Call MD / Call 911   Complete by: As directed    If you experience chest pain or shortness of breath, CALL 911 and be transported to the hospital emergency room.  If you develope a fever above 101 F, pus (white drainage) or increased drainage or redness at the wound, or calf pain, call your surgeon's office.   Constipation Prevention   Complete by: As directed    Drink plenty of fluids.  Prune juice may be helpful.  You may use a stool softener, such as Colace (over the counter) 100 mg twice a day.  Use MiraLax (over the counter) for constipation as needed.   Diet - low sodium heart healthy   Complete by: As directed    Discharge instructions   Complete by: As directed    INSTRUCTIONS AFTER JOINT  REPLACEMENT   Remove items at home which could result in a fall. This includes throw rugs or furniture in walking pathways ICE to the affected joint every three hours while awake for 30 minutes at a time, for at least the first 3-5 days, and then as needed for pain and swelling.  Continue to use ice for pain and swelling. You may notice swelling that will progress down to the foot and ankle.  This is normal after surgery.  Elevate your leg when you are not up walking on it.   Continue to use the breathing machine you got in the hospital (incentive spirometer) which will help keep your temperature down.  It is common for your temperature to cycle up and down following surgery, especially at night when you are not up moving around and exerting yourself.  The breathing machine keeps your lungs expanded and your temperature down.   DIET:  As you were doing prior to hospitalization, we recommend a well-balanced diet.  DRESSING / WOUND CARE / SHOWERING  You may shower 3 days after surgery, but keep the wounds dry during showering.  You may use an occlusive plastic wrap (Press'n Seal for example), NO SOAKING/SUBMERGING IN THE BATHTUB.  If the bandage gets wet, change with a clean dry gauze.  If the incision gets wet, pat the wound dry with a clean towel.  ACTIVITY  Increase activity slowly as tolerated, but follow the weight bearing instructions below.   No driving for 6 weeks or until further direction given by your physician.  You cannot drive while taking narcotics.  No lifting or carrying greater than 10 lbs. until further directed by your surgeon. Avoid periods of inactivity such as sitting longer than an hour when not asleep. This helps prevent blood clots.  You  may return to work once you are authorized by your doctor.     WEIGHT BEARING   Weight bearing as tolerated with assist device (walker, cane, etc) as directed, use it as long as suggested by your surgeon or therapist, typically at  least 4-6 weeks.   EXERCISES  Results after joint replacement surgery are often greatly improved when you follow the exercise, range of motion and muscle strengthening exercises prescribed by your doctor. Safety measures are also important to protect the joint from further injury. Any time any of these exercises cause you to have increased pain or swelling, decrease what you are doing until you are comfortable again and then slowly increase them. If you have problems or questions, call your caregiver or physical therapist for advice.   Rehabilitation is important following a joint replacement. After just a few days of immobilization, the muscles of the leg can become weakened and shrink (atrophy).  These exercises are designed to build up the tone and strength of the thigh and leg muscles and to improve motion. Often times heat used for twenty to thirty minutes before working out will loosen up your tissues and help with improving the range of motion but do not use heat for the first two weeks following surgery (sometimes heat can increase post-operative swelling).   These exercises can be done on a training (exercise) mat, on the floor, on a table or on a bed. Use whatever works the best and is most comfortable for you.    Use music or television while you are exercising so that the exercises are a pleasant break in your day. This will make your life better with the exercises acting as a break in your routine that you can look forward to.   Perform all exercises about fifteen times, three times per day or as directed.  You should exercise both the operative leg and the other leg as well.  Exercises include:   Quad Sets - Tighten up the muscle on the front of the thigh (Quad) and hold for 5-10 seconds.   Straight Leg Raises - With your knee straight (if you were given a brace, keep it on), lift the leg to 60 degrees, hold for 3 seconds, and slowly lower the leg.  Perform this exercise against  resistance later as your leg gets stronger.  Leg Slides: Lying on your back, slowly slide your foot toward your buttocks, bending your knee up off the floor (only go as far as is comfortable). Then slowly slide your foot back down until your leg is flat on the floor again.  Angel Wings: Lying on your back spread your legs to the side as far apart as you can without causing discomfort.  Hamstring Strength:  Lying on your back, push your heel against the floor with your leg straight by tightening up the muscles of your buttocks.  Repeat, but this time bend your knee to a comfortable angle, and push your heel against the floor.  You may put a pillow under the heel to make it more comfortable if necessary.   A rehabilitation program following joint replacement surgery can speed recovery and prevent re-injury in the future due to weakened muscles. Contact your doctor or a physical therapist for more information on knee rehabilitation.    CONSTIPATION  Constipation is defined medically as fewer than three stools per week and severe constipation as less than one stool per week.  Even if you have a regular bowel pattern at home,  your normal regimen is likely to be disrupted due to multiple reasons following surgery.  Combination of anesthesia, postoperative narcotics, change in appetite and fluid intake all can affect your bowels.   YOU MUST use at least one of the following options; they are listed in order of increasing strength to get the job done.  They are all available over the counter, and you may need to use some, POSSIBLY even all of these options:    Drink plenty of fluids (prune juice may be helpful) and high fiber foods Colace 100 mg by mouth twice a day  Senokot for constipation as directed and as needed Dulcolax (bisacodyl), take with full glass of water  Miralax (polyethylene glycol) once or twice a day as needed.  If you have tried all these things and are unable to have a bowel movement  in the first 3-4 days after surgery call either your surgeon or your primary doctor.    If you experience loose stools or diarrhea, hold the medications until you stool forms back up.  If your symptoms do not get better within 1 week or if they get worse, check with your doctor.  If you experience "the worst abdominal pain ever" or develop nausea or vomiting, please contact the office immediately for further recommendations for treatment.   ITCHING:  If you experience itching with your medications, try taking only a single pain pill, or even half a pain pill at a time.  You can also use Benadryl over the counter for itching or also to help with sleep.   TED HOSE STOCKINGS:  Use stockings on both legs until for at least 2 weeks or as directed by physician office. They may be removed at night for sleeping.  MEDICATIONS:  See your medication summary on the "After Visit Summary" that nursing will review with you.  You may have some home medications which will be placed on hold until you complete the course of blood thinner medication.  It is important for you to complete the blood thinner medication as prescribed.  PRECAUTIONS:  If you experience chest pain or shortness of breath - call 911 immediately for transfer to the hospital emergency department.   If you develop a fever greater that 101 F, purulent drainage from wound, increased redness or drainage from wound, foul odor from the wound/dressing, or calf pain - CONTACT YOUR SURGEON.                                                   FOLLOW-UP APPOINTMENTS:  If you do not already have a post-op appointment, please call the office for an appointment to be seen by your surgeon.  Guidelines for how soon to be seen are listed in your "After Visit Summary", but are typically between 1-4 weeks after surgery.  OTHER INSTRUCTIONS:   Knee Replacement:  Do not place pillow under knee, focus on keeping the knee straight while resting. CPM instructions: 0-90  degrees, 2 hours in the morning, 2 hours in the afternoon, and 2 hours in the evening. Place foam block, curve side up under heel at all times except when in CPM or when walking.  DO NOT modify, tear, cut, or change the foam block in any way.  POST-OPERATIVE OPIOID TAPER INSTRUCTIONS: It is important to wean off of your opioid medication as soon as  possible. If you do not need pain medication after your surgery it is ok to stop day one. Opioids include: Codeine, Hydrocodone(Norco, Vicodin), Oxycodone(Percocet, oxycontin) and hydromorphone amongst others.  Long term and even short term use of opiods can cause: Increased pain response Dependence Constipation Depression Respiratory depression And more.  Withdrawal symptoms can include Flu like symptoms Nausea, vomiting And more Techniques to manage these symptoms Hydrate well Eat regular healthy meals Stay active Use relaxation techniques(deep breathing, meditating, yoga) Do Not substitute Alcohol to help with tapering If you have been on opioids for less than two weeks and do not have pain than it is ok to stop all together.  Plan to wean off of opioids This plan should start within one week post op of your joint replacement. Maintain the same interval or time between taking each dose and first decrease the dose.  Cut the total daily intake of opioids by one tablet each day Next start to increase the time between doses. The last dose that should be eliminated is the evening dose.     MAKE SURE YOU:  Understand these instructions.  Get help right away if you are not doing well or get worse.    Thank you for letting us be a part of your medical care team.  It is a privilege we respect greatly.  We hope these instructions will help you stay on track for a fast and full recovery!   Increase activity slowly as tolerated   Complete by: As directed    Post-operative opioid taper instructions:   Complete by: As directed     POST-OPERATIVE OPIOID TAPER INSTRUCTIONS: It is important to wean off of your opioid medication as soon as possible. If you do not need pain medication after your surgery it is ok to stop day one. Opioids include: Codeine, Hydrocodone(Norco, Vicodin), Oxycodone(Percocet, oxycontin) and hydromorphone amongst others.  Long term and even short term use of opiods can cause: Increased pain response Dependence Constipation Depression Respiratory depression And more.  Withdrawal symptoms can include Flu like symptoms Nausea, vomiting And more Techniques to manage these symptoms Hydrate well Eat regular healthy meals Stay active Use relaxation techniques(deep breathing, meditating, yoga) Do Not substitute Alcohol to help with tapering If you have been on opioids for less than two weeks and do not have pain than it is ok to stop all together.  Plan to wean off of opioids This plan should start within one week post op of your joint replacement. Maintain the same interval or time between taking each dose and first decrease the dose.  Cut the total daily intake of opioids by one tablet each day Next start to increase the time between doses. The last dose that should be eliminated is the evening dose.           Follow-up Information     Marcene Corning, MD. Schedule an appointment as soon as possible for a visit in 2 week(s).   Specialty: Orthopedic Surgery Contact information: [address] [place] [address] [phone]                  Signed: Ginger Organ Alfredo Spong 01/19/2022, 7:04 AM

## 2022-01-20 ENCOUNTER — Encounter (HOSPITAL_COMMUNITY): Payer: Self-pay | Admitting: Orthopaedic Surgery

## 2022-02-27 ENCOUNTER — Other Ambulatory Visit: Payer: Self-pay | Admitting: Orthopaedic Surgery

## 2022-03-03 ENCOUNTER — Telehealth: Payer: Self-pay | Admitting: Internal Medicine

## 2022-03-03 NOTE — Telephone Encounter (Signed)
? ?  Pre-operative Risk Assessment  ?  ?Patient Name: Lindsay [name]  ?DOB: 1961/09/19 ?MRN: [id number]  ? ?  ? ?Request for Surgical Clearance   ? ?Procedure:  Right Hip replacement ? ?Date of Surgery:  03-28-22 ?Clearance                               ?   ?Surgeon:   Dr Marcene Corning ?Surgeon's Group or Practice Name:   ?Phone number:  [phone] ?Fax number:  [phone] ?  ?Type of Clearance Requested:   ?- Medical  ?- Pharmacy:  Hold Aspirin 81 mg ?  ?Type of Anesthesia:  Spinal ?  ?Additional requests/questions:   ? ?Signed, ?Laurence Ferrari   ?03/03/2022, 11:32 AM  ? ?

## 2022-03-03 NOTE — Telephone Encounter (Signed)
Left a message for the pt to call the office to set up a tele pre op appt.  ?

## 2022-03-03 NOTE — Telephone Encounter (Signed)
Lindsay [name] 61 year old female is requesting preoperative cardiac evaluation for right hip replacement.  She was last seen in the clinic on 05/27/2021.  During that time she denied chest pain, shortness of breath, and lower extremity swelling.  Follow-up was planned for 12 months. ? ?Her PMH includes  GERD, and chest pain with no obstructive coronary artery disease, ruled out for pulmonary embolism.  Diagnosis of coronary artery spasm was made based on response to intracoronary nitroglycerin in the Cath Lab.  The patient continued to have some chest pain during her hospitalization which was responsive to sublingual nitroglycerin.  We had initiated Imdur.  We had also initiated amlodipine as an additional therapy with regard to coronary spasm. No chest pain or tightness, especially since starting the lasix. Since starting lasix she has discontinued amlodipine and has optimal blood pressure control. Continues on lasix without issue, which has been most helpful. Continues on imdur at 30 mg.  ? ? ?May her aspirin be held prior to her procedure? ? ?Thank you for your help.  Please direct your response to CV DIV preop will. ? ?Thomasene Ripple. Brooke Steinhilber NP-C ? ?  ?03/03/2022, 12:15 PM ?[facility] ?[address] ?Office [phone] Fax [phone] ? ?

## 2022-03-03 NOTE — Telephone Encounter (Signed)
Her aspirin may be held for 5 to 7 days prior to her procedure.  Please resume as soon as hemostasis is achieved. ? ? ?Preoperative team, please contact this patient and set up a phone call appointment for further cardiac evaluation.  Thank you for your help. ? ?Thomasene Ripple. Zhane Donlan NP-C ? ?  ?03/03/2022, 2:15 PM ?[facility] ?[address] ?Office [phone] Fax [phone] ? ?

## 2022-03-08 NOTE — Telephone Encounter (Signed)
2nd attempt to reach pt to schedule

## 2022-03-09 ENCOUNTER — Telehealth: Payer: Self-pay | Admitting: *Deleted

## 2022-03-09 NOTE — Telephone Encounter (Signed)
Pt agreeable to plan of care for tele pre op appt 03/10/22 @ 9 am. Med rec and consent are done ?

## 2022-03-09 NOTE — Telephone Encounter (Signed)
Pt agreeable to plan of care for tele pre op appt 03/10/22 @ 9 am. Med rec and consent are done.  ? ?  ?Patient Consent for Virtual Visit  ? ? ?   ? ?Lindsay [name] has provided verbal consent on 03/09/2022 for a virtual visit (video or telephone). ? ? ?CONSENT FOR VIRTUAL VISIT FOR:  Lindsay [name]  ?By participating in this virtual visit I agree to the following: ? ?I hereby voluntarily request, consent and authorize [facility] and its employed or contracted physicians, physician assistants, nurse practitioners or other licensed health care professionals (the Practitioner), to provide me with telemedicine health care services (the ?Services") as deemed necessary by the treating Practitioner. I acknowledge and consent to receive the Services by the Practitioner via telemedicine. I understand that the telemedicine visit will involve communicating with the Practitioner through live audiovisual communication technology and the disclosure of certain medical information by electronic transmission. I acknowledge that I have been given the opportunity to request an in-person assessment or other available alternative prior to the telemedicine visit and am voluntarily participating in the telemedicine visit. ? ?I understand that I have the right to withhold or withdraw my consent to the use of telemedicine in the course of my care at any time, without affecting my right to future care or treatment, and that the Practitioner or I may terminate the telemedicine visit at any time. I understand that I have the right to inspect all information obtained and/or recorded in the course of the telemedicine visit and may receive copies of available information for a reasonable fee.  I understand that some of the potential risks of receiving the Services via telemedicine include:  ?Delay or interruption in medical evaluation due to technological equipment failure or disruption; ?Information transmitted may not be sufficient (e.g. poor  resolution of images) to allow for appropriate medical decision making by the Practitioner; and/or  ?In rare instances, security protocols could fail, causing a breach of personal health information. ? ?Furthermore, I acknowledge that it is my responsibility to provide information about my medical history, conditions and care that is complete and accurate to the best of my ability. I acknowledge that Practitioner's advice, recommendations, and/or decision may be based on factors not within their control, such as incomplete or inaccurate data provided by me or distortions of diagnostic images or specimens that may result from electronic transmissions. I understand that the practice of medicine is not an exact science and that Practitioner makes no warranties or guarantees regarding treatment outcomes. I acknowledge that a copy of this consent can be made available to me via my patient portal ([facility]), or I can request a printed copy by calling the office of [facility].   ? ?I understand that my insurance will be billed for this visit.  ? ?I have read or had this consent read to me. ?I understand the contents of this consent, which adequately explains the benefits and risks of the Services being provided via telemedicine.  ?I have been provided ample opportunity to ask questions regarding this consent and the Services and have had my questions answered to my satisfaction. ?I give my informed consent for the services to be provided through the use of telemedicine in my medical care ? ? ? ?

## 2022-03-10 ENCOUNTER — Ambulatory Visit (INDEPENDENT_AMBULATORY_CARE_PROVIDER_SITE_OTHER): Payer: 59 | Admitting: General Practice

## 2022-03-10 DIAGNOSIS — Z01810 Encounter for preprocedural cardiovascular examination: Secondary | ICD-10-CM | POA: Diagnosis not present

## 2022-03-10 NOTE — Progress Notes (Signed)
? ?Virtual Visit via Telephone Note  ? ?This visit type was conducted due to national recommendations for restrictions regarding the COVID-19 Pandemic (e.g. social distancing) in an effort to limit this patient's exposure and mitigate transmission in our community.  Due to her co-morbid illnesses, this patient is at least at moderate risk for complications without adequate follow up.  This format is felt to be most appropriate for this patient at this time.  The patient did not have access to video technology/had technical difficulties with video requiring transitioning to audio format only (telephone).  All issues noted in this document were discussed and addressed.  No physical exam could be performed with this format.  Please refer to the patient's chart for her  consent to telehealth for [facility]. ? ?Evaluation Performed:  Preoperative cardiovascular risk assessment ?_____________  ? ?Date:  03/10/2022  ? ?Patient ID:  Lindsay [name], DOB 1961-02-22, MRN [id number] ?Patient Location:  ?Home ?Provider location:   ?Office ? ?Primary Care Provider:  Irena Reichmannollins, Dana, DO ?Primary Cardiologist:  Parke PoissonGayatri A Acharya, MD ? ?Chief Complaint  ?  ?61 y.o. y/o female with a h/o GERD, nonobstructive CAD, chest pain, who is pending right hip replacement, and presents today for telephonic preoperative cardiovascular risk assessment. ? ?Past Medical History  ?  ?Past Medical History:  ?Diagnosis Date  ? Allergic rhinitis, cause unspecified   ? seasonal  ? Anginal pain (HCC) 2013  ? Stress  ? Arthritis   ? hips, thumb,knees  ? Diabetes mellitus without complication (HCC)   ? Essential hypertension, benign 12/12/2011  ? Joint pain   ? Multiple drug allergies   ? Obesity   ? Wears glasses   ? Wears partial dentures   ? upper  ? ?Past Surgical History:  ?Procedure Laterality Date  ? COLONOSCOPY  12/11/2006  ? [facility]  ? LEFT HEART CATH AND CORONARY ANGIOGRAPHY N/A 11/11/2018  ? Procedure: LEFT HEART CATH AND CORONARY  ANGIOGRAPHY;  Surgeon: Yvonne KendallEnd, Christopher, MD;  Location: [facility];  Service: Cardiovascular;  Laterality: N/A;  ? left knee surgery  1990  ? arthroscopy  ? ROTATOR CUFF REPAIR  09/1990  ? right shoulder  ? tonsils removed    ? as a child  ? TOTAL HIP ARTHROPLASTY Left 01/17/2022  ? Procedure: LEFT TOTAL HIP ARTHROPLASTY ANTERIOR APPROACH;  Surgeon: Marcene Corningalldorf, Peter, MD;  Location: [facility];  Service: Orthopedics;  Laterality: Left;  ? TUBAL LIGATION  02/08/1989  ? WISDOM TOOTH EXTRACTION    ? age [age]s  ? ? ?Allergies ? ?Allergies  ?Allergen Reactions  ? Latex Anaphylaxis  ?  Depending on how long pt is touched with latex items  ? Penicillins Anaphylaxis  ? Prednisone Swelling  ? Sulfa Antibiotics Hives  ? Ceftin [Cefuroxime Axetil] Rash  ?  Tolerates Keflex  ? Doxycycline Rash  ? Other Rash  ?  ALL MYCINS  ? ? ?History of Present Illness  ?  ?Lindsay [name] is a 61 y.o. female who presents via audio/video conferencing for a telehealth visit today.  Pt was last seen in cardiology clinic on 05/27/2021 by Dr. Jacques NavyAcharya.  At that time Lindsay [name] was doing well .  The patient is now pending right hip replacement.  Since her last visit, she remained stable from a cardiac standpoint. ? ?Today she denies chest pain, shortness of breath, lower extremity edema, fatigue, palpitations, melena, hematuria, hemoptysis, diaphoresis, weakness, presyncope, syncope, orthopnea, and PND. ? ?Home Medications  ?  ?  Prior to Admission medications   ?Medication Sig Start Date End Date Taking? Authorizing Provider  ?aspirin EC 81 MG tablet Take 81 mg by mouth in the morning. Swallow whole.    [provider]  ?Azelastine HCl 0.15 % SOLN Place 1 spray into the nose in the morning and at bedtime.    [provider]  ?famotidine (PEPCID) 40 MG tablet Take 40 mg by mouth daily as needed for heartburn or indigestion.    [provider]  ?furosemide (LASIX) 40 MG tablet Take 80 mg by mouth 2 (two) times daily.     [provider]  ?gabapentin (NEURONTIN) 300 MG capsule Take 300-600 mg by mouth 3 (three) times daily as needed (pain/spasms). 12/09/21   [provider]  ?HYDROcodone-acetaminophen (NORCO) 7.5-325 MG tablet Take 1-2 tablets by mouth every 6 (six) hours as needed for severe pain (post op pain). ?Patient not taking: Reported on 03/09/2022 01/19/22   Elodia Florence, PA-C  ?isosorbide mononitrate (IMDUR) 30 MG 24 hr tablet Take 30 mg by mouth at bedtime.    [provider]  ?lidocaine (LIDODERM) 5 % Place 1 patch onto the skin every 12 (twelve) hours. 10/15/21   [provider]  ?metaxalone (SKELAXIN) 800 MG tablet Take 800 mg by mouth in the morning and at bedtime.    [provider]  ?Multiple Vitamin (MULTIVITAMIN WITH MINERALS) TABS tablet Take 1 tablet by mouth in the morning.    [provider]  ?nitroGLYCERIN (NITROSTAT) 0.4 MG SL tablet Place 1 tablet (0.4 mg total) under the tongue every 5 (five) minutes as needed for chest pain. 01/20/21   Parke Poisson, MD  ?potassium chloride SA (KLOR-CON) 20 MEQ tablet Take 40 mEq by mouth in the morning.    [provider]  ?tirzepatide Greggory Keen) 10 MG/0.5ML Pen Inject 10 mg into the skin every Monday.    [provider]  ?tiZANidine (ZANAFLEX) 4 MG tablet Take 1 tablet (4 mg total) by mouth every 6 (six) hours as needed for muscle spasms. ?Patient not taking: Reported on 03/09/2022 01/19/22 01/19/23  Elodia Florence, PA-C  ?topiramate (TOPAMAX) 100 MG tablet Take 100 mg by mouth 2 (two) times daily. 11/07/21   [provider]  ? ? ?Physical Exam  ?  ?Vital Signs:  Lindsay [name] does not have vital signs available for review today. ? ?Given telephonic nature of communication, physical exam is limited. ?AAOx3. NAD. Normal affect.  Speech and respirations are unlabored. ? ?Accessory Clinical Findings  ?  ?None ? ?Assessment & Plan  ?  ?1.  Preoperative Cardiovascular Risk Assessment: ? ?  ? ?Primary  Cardiologist: Parke Poisson, MD ? ?Chart reviewed as part of pre-operative protocol coverage. Given past medical history and time since last visit, based on ACC/AHA guidelines, Lindsay [name] would be at acceptable risk for the planned procedure without further cardiovascular testing.  ? ?Patient was advised that if she develops new symptoms prior to surgery to contact our office to arrange a follow-up appointment.  He verbalized understanding. ? ?Her RCRI is a class I risk, 0.4% risk of major cardiac event.  She is able to complete greater than 4 METS of physical activity. ? ? ?Her aspirin may be held for 5 to 7 days prior to her procedure.  Please resume as soon as hemostasis is achieved ? ? ?A copy of this note will be routed to requesting surgeon. ? ?Time:   ?Today, I have spent 5 minutes with  the patient with telehealth technology discussing medical history, symptoms, and management plan.  I spent greater than 10 minutes prior to the phone evaluation reviewing the patient's past medical history and medications. ? ? ?Ronney Asters, NP ? ?03/10/2022, 7:02 AM ? ?

## 2022-03-13 NOTE — Progress Notes (Signed)
DUE TO COVID-19 ONLY  2 VISITOR IS ALLOWED TO COME WITH YOU AND STAY IN THE WAITING ROOM ONLY DURING PRE OP AND PROCEDURE DAY OF SURGERY.      4  VISITOR  MAY VISIT WITH YOU AFTER SURGERY IN YOUR PRIVATE ROOM DURING VISITING HOURS ONLY! ?YOU MAY HAVE ONE PERSON SPEND THE NITE WITH YOU IN YOUR ROOM AFTER SURGERY.   ? ? ? Your procedure is scheduled on:  ? 03/28/2022  ? Report to [facility] Main  Entrance ? ? Report to admitting at     0945          AM ?DO NOT BRING INSURANCE CARD, PICTURE ID OR WALLET DAY OF SURGERY.  ?  ? ? Call this number if you have problems the morning of surgery [phone]  ? ? REMEMBER: NO  SOLID FOODS , CANDY, GUM OR MINTS AFTER MIDNITE THE NITE BEFORE SURGERY .       Marland Kitchen CLEAR LIQUIDS UNTIL      0930am           DAY OF SURGERY.      PLEASE FINISH ENSURE DRINK PER SURGEON ORDER  WHICH NEEDS TO BE COMPLETED AT   0.930am       MORNING OF SURGERY.   ? ? ? ? ?CLEAR LIQUID DIET ? ? ?Foods Allowed      ?WATER ?BLACK COFFEE ( SUGAR OK, NO MILK, CREAM OR CREAMER) REGULAR AND DECAF  ?TEA ( SUGAR OK NO MILK, CREAM, OR CREAMER) REGULAR AND DECAF  ?PLAIN JELLO ( NO RED)  ?FRUIT ICES ( NO RED, NO FRUIT PULP)  ?POPSICLES ( NO RED)  ?JUICE- APPLE, WHITE GRAPE AND WHITE CRANBERRY  ?SPORT DRINK LIKE GATORADE ( NO RED)  ?CLEAR BROTH ( VEGETABLE , CHICKEN OR BEEF)                                                               ? ?    ? ?BRUSH YOUR TEETH MORNING OF SURGERY AND RINSE YOUR MOUTH OUT, NO CHEWING GUM CANDY OR MINTS. ?  ? ? Take these medicines the morning of surgery with A SIP OF WATER:  nasal spray, pepcid if needed, topamax gabapentin if needed  ? ? ?DO NOT TAKE ANY DIABETIC MEDICATIONS DAY OF YOUR SURGERY ?                  ?            You may not have any metal on your body including hair pins and  ?            piercings  Do not wear jewelry, make-up, lotions, powders or perfumes, deodorant ?            Do not wear nail polish on your fingernails.   ?           IF YOU ARE A FEMALE AND  WANT TO SHAVE UNDER ARMS OR LEGS PRIOR TO SURGERY YOU MUST DO SO AT LEAST 48 HOURS PRIOR TO SURGERY.  ?            Men may shave face and neck. ? ? Do not bring valuables to the hospital. [facility] IS NOT ?  RESPONSIBLE   FOR VALUABLES. ? Contacts, dentures or bridgework may not be worn into surgery. ? Leave suitcase in the car. After surgery it may be brought to your room. ? ?  ? Patients discharged the day of surgery will not be allowed to drive home. IF YOU ARE HAVING SURGERY AND GOING HOME THE SAME DAY, YOU MUST HAVE AN ADULT TO DRIVE YOU HOME AND BE WITH YOU FOR 24 HOURS. YOU MAY GO HOME BY TAXI OR UBER OR ORTHERWISE, BUT AN ADULT MUST ACCOMPANY YOU HOME AND STAY WITH YOU FOR 24 HOURS. ?  ? ?            Please read over the following fact sheets you were given: ?_____________________________________________________________________ ? ?[facility] - Preparing for Surgery ?Before surgery, you can play an important role.  Because skin is not sterile, your skin needs to be as free of germs as possible.  You can reduce the number of germs on your skin by washing with CHG (chlorahexidine gluconate) soap before surgery.  CHG is an antiseptic cleaner which kills germs and bonds with the skin to continue killing germs even after washing. ?Please DO NOT use if you have an allergy to CHG or antibacterial soaps.  If your skin becomes reddened/irritated stop using the CHG and inform your nurse when you arrive at Short Stay. ?Do not shave (including legs and underarms) for at least 48 hours prior to the first CHG shower.  You may shave your face/neck. ?Please follow these instructions carefully: ? 1.  Shower with CHG Soap the night before surgery and the  morning of Surgery. ? 2.  If you choose to wash your hair, wash your hair first as usual with your  normal  shampoo. ? 3.  After you shampoo, rinse your hair and body thoroughly to remove the  shampoo.                           4.  Use CHG as you would any other  liquid soap.  You can apply chg directly  to the skin and wash  ?                     Gently with a scrungie or clean washcloth. ? 5.  Apply the CHG Soap to your body ONLY FROM THE NECK DOWN.   Do not use on face/ open      ?                     Wound or open sores. Avoid contact with eyes, ears mouth and genitals (private parts).  ?                     Production manager,  Genitals (private parts) with your normal soap. ?            6.  Wash thoroughly, paying special attention to the area where your surgery  will be performed. ? 7.  Thoroughly rinse your body with warm water from the neck down. ? 8.  DO NOT shower/wash with your normal soap after using and rinsing off  the CHG Soap. ?               9.  Pat yourself dry with a clean towel. ?           10.  Wear clean pajamas. ?  11.  Place clean sheets on your bed the night of your first shower and do not  sleep with pets. ?Day of Surgery : ?Do not apply any lotions/deodorants the morning of surgery.  Please wear clean clothes to the hospital/surgery center. ? ?FAILURE TO FOLLOW THESE INSTRUCTIONS MAY RESULT IN THE CANCELLATION OF YOUR SURGERY ?PATIENT SIGNATURE_________________________________ ? ?NURSE SIGNATURE__________________________________ ? ?________________________________________________________________________  ? ? ?           ?

## 2022-03-13 NOTE — Progress Notes (Addendum)
Anesthesia Review: ? ?PCP: Irena Reichmann  ?Cardiologist : Scheryl Marten- LOV 03/10/22 preop exam DR Jacques Navy  ?Chest x-ray : 01/09/2022  ?EKG :01/10/22  ?Echo : 2019  ?Stress test: ?Cardiac Cath : 2019  ?Activity level: can do a flight of stairs withoiut difficulty  ?Sleep Study/ CPAP :none  ?Fasting Blood Sugar :      / Checks Blood Sugar -- times a day:   ?Blood Thinner/ Instructions /Last Dose: ?ASA / Instructions/ Last Dose :   ?81 mg Aspirin ?DM- type 2  ?Checks glucose bid at home  ?Hgba1c-03/15/22- 5.5  ?Left hip- 01/17/22  ?

## 2022-03-15 ENCOUNTER — Encounter (INDEPENDENT_AMBULATORY_CARE_PROVIDER_SITE_OTHER): Payer: Self-pay

## 2022-03-15 ENCOUNTER — Other Ambulatory Visit: Payer: Self-pay

## 2022-03-15 ENCOUNTER — Encounter (HOSPITAL_COMMUNITY)
Admission: RE | Admit: 2022-03-15 | Discharge: 2022-03-15 | Disposition: A | Discharge status: Home / Self Care | Payer: 59 | Source: Ambulatory Visit | Attending: Orthopaedic Surgery | Admitting: Orthopaedic Surgery

## 2022-03-15 ENCOUNTER — Encounter (HOSPITAL_COMMUNITY): Payer: Self-pay

## 2022-03-15 VITALS — BP 139/74 | HR 71 | Temp 98.4°F | Resp 16 | Ht 67.5 in | Wt 213.0 lb

## 2022-03-15 DIAGNOSIS — Z01818 Encounter for other preprocedural examination: Secondary | ICD-10-CM

## 2022-03-15 DIAGNOSIS — I10 Essential (primary) hypertension: Secondary | ICD-10-CM | POA: Insufficient documentation

## 2022-03-15 DIAGNOSIS — Z87891 Personal history of nicotine dependence: Secondary | ICD-10-CM | POA: Insufficient documentation

## 2022-03-15 DIAGNOSIS — M1611 Unilateral primary osteoarthritis, right hip: Secondary | ICD-10-CM | POA: Insufficient documentation

## 2022-03-15 DIAGNOSIS — Z01812 Encounter for preprocedural laboratory examination: Secondary | ICD-10-CM | POA: Diagnosis present

## 2022-03-15 DIAGNOSIS — Z7985 Long-term (current) use of injectable non-insulin antidiabetic drugs: Secondary | ICD-10-CM | POA: Diagnosis not present

## 2022-03-15 DIAGNOSIS — E119 Type 2 diabetes mellitus without complications: Secondary | ICD-10-CM | POA: Insufficient documentation

## 2022-03-15 LAB — CBC
HCT: 38.2 % (ref 36.0–46.0)
Hemoglobin: 11.2 g/dL — ABNORMAL LOW (ref 12.0–15.0)
MCH: 22.8 pg — ABNORMAL LOW (ref 26.0–34.0)
MCHC: 29.3 g/dL — ABNORMAL LOW (ref 30.0–36.0)
MCV: 77.6 fL — ABNORMAL LOW (ref 80.0–100.0)
Platelets: 301 10*3/uL (ref 150–400)
RBC: 4.92 MIL/uL (ref 3.87–5.11)
RDW: 17.7 % — ABNORMAL HIGH (ref 11.5–15.5)
WBC: 4.1 10*3/uL (ref 4.0–10.5)
nRBC: 0 % (ref 0.0–0.2)

## 2022-03-15 LAB — GLUCOSE, CAPILLARY: Glucose-Capillary: 88 mg/dL (ref 70–99)

## 2022-03-15 LAB — BASIC METABOLIC PANEL
Anion gap: 7 (ref 5–15)
BUN: 11 mg/dL (ref 8–23)
CO2: 25 mmol/L (ref 22–32)
Calcium: 9.6 mg/dL (ref 8.9–10.3)
Chloride: 107 mmol/L (ref 98–111)
Creatinine, Ser: 0.59 mg/dL (ref 0.44–1.00)
GFR, Estimated: >60 mL/min (ref >60)
Glucose, Bld: 94 mg/dL (ref 70–99)
Potassium: 3.8 mmol/L (ref 3.5–5.1)
Sodium: 139 mmol/L (ref 135–145)

## 2022-03-15 LAB — TYPE AND SCREEN
ABO/RH(D): O POS
Antibody Screen: NEGATIVE

## 2022-03-15 LAB — SURGICAL PCR SCREEN
MRSA, PCR: NEGATIVE
Staphylococcus aureus: NEGATIVE

## 2022-03-16 LAB — HEMOGLOBIN A1C
Hgb A1c MFr Bld: 5.5 % (ref 4.8–5.6)
Mean Plasma Glucose: 111 mg/dL

## 2022-03-16 NOTE — Progress Notes (Signed)
Anesthesia Chart Review ? ? Case: [id number] Date/Time: 03/28/22 1219  ? Procedure: RIGH TOTAL HIP ARTHROPLASTY ANTERIOR APPROACH (Right: Hip)  ? Anesthesia type: Spinal  ? Pre-op diagnosis: RIGHT HIP DEGENERATIVE JOINT DISEASE  ? [facility] RO[facility] 07 / [facility]  ? Surgeons: Marcene Corning, MD  ? ?  ? ? ?DISCUSSION:61 y.o. former smoker with h/o HTN, DM II, right hip djd scheduled for above procedure 03/28/2022 with Dr. Marcene Corning.  ? ?Pt last seen by cardiology 03/10/2022. Per OV note, "Chart reviewed as part of pre-operative protocol coverage. Given past medical history and time since last visit, based on ACC/AHA guidelines, Lindsay [name] would be at acceptable risk for the planned procedure without further cardiovascular testing.  ?  ?Patient was advised that if she develops new symptoms prior to surgery to contact our office to arrange a follow-up appointment.  He verbalized understanding. ?  ?Her RCRI is a class I risk, 0.4% risk of major cardiac event.  She is able to complete greater than 4 METS of physical activity. ?  ?Her aspirin may be held for 5 to 7 days prior to her procedure.  Please resume as soon as hemostasis is achieved" ? ?Anticipate pt can proceed with planned procedure barring acute status change.   ?VS: BP 139/74   Pulse 71   Temp 36.9 ?C (Oral)   Resp 16   Ht 5' 7.5" (1.715 m)   Wt 96.6 kg   SpO2 100%   BMI 32.87 kg/m?  ? ?PROVIDERS: ?Irena Reichmann, DO is PCP  ? ?Primary Cardiologist: Parke Poisson, MD ?LABS: Labs reviewed: Acceptable for surgery. ?(all labs ordered are listed, but only abnormal results are displayed) ? ?Labs Reviewed  ?CBC - Abnormal; Notable for the following components:  ?    Result Value  ? Hemoglobin 11.2 (*)   ? MCV 77.6 (*)   ? MCH 22.8 (*)   ? MCHC 29.3 (*)   ? RDW 17.7 (*)   ? All other components within normal limits  ?SURGICAL PCR SCREEN  ?HEMOGLOBIN A1C  ?BASIC METABOLIC PANEL  ?GLUCOSE, CAPILLARY  ?TYPE AND SCREEN   ? ? ? ?IMAGES: ? ? ?EKG: ?01/10/2022 ?Rate 107 bpm  ?Sinus tachycardia with premature atrial complexes ?Left axis deviation ?Cannot rule out anterior infarct, age undetermined  ? ?CV: ?Echo 11/10/2018 ?Study Conclusions  ? ?- Left ventricle: The cavity size was normal. Wall thickness was  ?  increased in a pattern of mild LVH. Systolic function was normal.  ?  The estimated ejection fraction was in the range of 55% to 60%.  ?  Wall motion was normal; there were no regional wall motion  ?  abnormalities. Left ventricular diastolic function parameters  ?  were normal.  ? ?Impressions:  ? ?- Normal LV function; mild LVH; mild TR. ?Past Medical History:  ?Diagnosis Date  ? Allergic rhinitis, cause unspecified   ? seasonal  ? Anginal pain (HCC) 2013  ? Stress  ? Arthritis   ? hips, thumb,knees  ? Diabetes mellitus without complication (HCC)   ? Essential hypertension, benign 12/12/2011  ? Joint pain   ? Multiple drug allergies   ? Obesity   ? Wears glasses   ? Wears partial dentures   ? upper  ? ? ?Past Surgical History:  ?Procedure Laterality Date  ? COLONOSCOPY  12/11/2006  ? [facility]  ? LEFT HEART CATH AND CORONARY ANGIOGRAPHY N/A 11/11/2018  ? Procedure: LEFT HEART CATH AND CORONARY ANGIOGRAPHY;  Surgeon: Yvonne Kendall, MD;  [facility];  Service: Cardiovascular;  Laterality: N/A;  ? left knee surgery  1990  ? arthroscopy  ? ROTATOR CUFF REPAIR  09/1990  ? right shoulder  ? tonsils removed    ? as a child  ? TOTAL HIP ARTHROPLASTY Left 01/17/2022  ? Procedure: LEFT TOTAL HIP ARTHROPLASTY ANTERIOR APPROACH;  Surgeon: Marcene Corning, MD;  Location: [facility];  Service: Orthopedics;  Laterality: Left;  ? TUBAL LIGATION  02/08/1989  ? WISDOM TOOTH EXTRACTION    ? age 57s  ? ? ?MEDICATIONS: ? aspirin EC 81 MG tablet  ? Azelastine HCl 0.15 % SOLN  ? famotidine (PEPCID) 40 MG tablet  ? furosemide (LASIX) 40 MG tablet  ? gabapentin (NEURONTIN) 300 MG capsule  ? HYDROcodone-acetaminophen (NORCO) 7.5-325  MG tablet  ? isosorbide mononitrate (IMDUR) 30 MG 24 hr tablet  ? lidocaine (LIDODERM) 5 %  ? metaxalone (SKELAXIN) 800 MG tablet  ? Multiple Vitamin (MULTIVITAMIN WITH MINERALS) TABS tablet  ? nitroGLYCERIN (NITROSTAT) 0.4 MG SL tablet  ? potassium chloride SA (KLOR-CON) 20 MEQ tablet  ? tirzepatide (MOUNJARO) 10 MG/0.5ML Pen  ? tiZANidine (ZANAFLEX) 4 MG tablet  ? topiramate (TOPAMAX) 100 MG tablet  ? ?No current facility-administered medications for this encounter.  ? ?Lindsay Cipro Ward, PA-C ?[facility] ?[phone] ? ? ? ? ? ? ? ?

## 2022-03-27 NOTE — H&P (Signed)
TOTAL HIP ADMISSION H&P ? ?Patient is admitted for right total hip arthroplasty. ? ?Subjective: ? ?Chief Complaint: right hip pain ? ?HPI: Lindsay [name], 61 y.o. female, has a history of pain and functional disability in the right hip(s) due to arthritis and patient has failed non-surgical conservative treatments for greater than 12 weeks to include NSAID's and/or analgesics, corticosteriod injections, flexibility and strengthening excercises, supervised PT with diminished ADL's post treatment, use of assistive devices, weight reduction as appropriate, and activity modification.  Onset of symptoms was gradual starting 5 years ago with gradually worsening course since that time.The patient noted no past surgery on the right hip(s).  Patient currently rates pain in the right hip at 10 out of 10 with activity. Patient has night pain, worsening of pain with activity and weight bearing, trendelenberg gait, pain that interfers with activities of daily living, crepitus, and joint swelling. Patient has evidence of subchondral cysts, subchondral sclerosis, periarticular osteophytes, and joint space narrowing by imaging studies. This condition presents safety issues increasing the risk of falls. There is no current active infection. ? ?Patient Active Problem List  ? Diagnosis Date Noted  ? Primary localized osteoarthritis of left hip 01/17/2022  ? Primary osteoarthritis of left hip 01/17/2022  ? Coronary vasospasm (HCC) 11/14/2018  ? NSTEMI (non-ST elevated myocardial infarction) (HCC) 11/09/2018  ? Essential hypertension, benign 01/26/2014  ? Obesity, unspecified 01/26/2014  ? Allergy, urticaria 04/02/2012  ? ?Past Medical History:  ?Diagnosis Date  ? Allergic rhinitis, cause unspecified   ? seasonal  ? Anginal pain (HCC) 2013  ? Stress  ? Arthritis   ? hips, thumb,knees  ? Diabetes mellitus without complication (HCC)   ? Essential hypertension, benign 12/12/2011  ? Joint pain   ? Multiple drug allergies   ? Obesity   ?  Wears glasses   ? Wears partial dentures   ? upper  ?  ?Past Surgical History:  ?Procedure Laterality Date  ? COLONOSCOPY  12/11/2006  ? [facility]  ? LEFT HEART CATH AND CORONARY ANGIOGRAPHY N/A 11/11/2018  ? Procedure: LEFT HEART CATH AND CORONARY ANGIOGRAPHY;  Surgeon: Yvonne Kendall, MD;  Location: [facility];  Service: Cardiovascular;  Laterality: N/A;  ? left knee surgery  1990  ? arthroscopy  ? ROTATOR CUFF REPAIR  09/1990  ? right shoulder  ? tonsils removed    ? as a child  ? TOTAL HIP ARTHROPLASTY Left 01/17/2022  ? Procedure: LEFT TOTAL HIP ARTHROPLASTY ANTERIOR APPROACH;  Surgeon: Marcene Corning, MD;  Location: [facility];  Service: Orthopedics;  Laterality: Left;  ? TUBAL LIGATION  02/08/1989  ? WISDOM TOOTH EXTRACTION    ? age 4s  ?  ?No current facility-administered medications for this encounter.  ? ?Current Outpatient Medications  ?Medication Sig Dispense Refill Last Dose  ? aspirin EC 81 MG tablet Take 81 mg by mouth in the morning. Swallow whole.     ? Azelastine HCl 0.15 % SOLN Place 1 spray into the nose in the morning and at bedtime.     ? famotidine (PEPCID) 40 MG tablet Take 40 mg by mouth daily as needed for heartburn or indigestion.     ? furosemide (LASIX) 40 MG tablet Take 80 mg by mouth 2 (two) times daily.     ? gabapentin (NEURONTIN) 300 MG capsule Take 300-600 mg by mouth 3 (three) times daily as needed (pain/spasms).     ? isosorbide mononitrate (IMDUR) 30 MG 24 hr tablet Take 30 mg by mouth  at bedtime.     ? lidocaine (LIDODERM) 5 % Place 1 patch onto the skin every 12 (twelve) hours.     ? metaxalone (SKELAXIN) 800 MG tablet Take 800 mg by mouth in the morning and at bedtime.     ? Multiple Vitamin (MULTIVITAMIN WITH MINERALS) TABS tablet Take 1 tablet by mouth in the morning.     ? nitroGLYCERIN (NITROSTAT) 0.4 MG SL tablet Place 1 tablet (0.4 mg total) under the tongue every 5 (five) minutes as needed for chest pain. 25 tablet 5   ? potassium chloride SA (KLOR-CON) 20  MEQ tablet Take 40 mEq by mouth in the morning.     ? tirzepatide [facility]) 10 MG/0.5ML Pen Inject 10 mg into the skin every Monday.     ? topiramate (TOPAMAX) 100 MG tablet Take 100 mg by mouth 2 (two) times daily.     ? HYDROcodone-acetaminophen (NORCO) 7.5-325 MG tablet Take 1-2 tablets by mouth every 6 (six) hours as needed for severe pain (post op pain). (Patient not taking: Reported on 03/09/2022) 40 tablet 0 Not Taking  ? tiZANidine (ZANAFLEX) 4 MG tablet Take 1 tablet (4 mg total) by mouth every 6 (six) hours as needed for muscle spasms. (Patient not taking: Reported on 03/09/2022) 40 tablet 1 Not Taking  ? ?Allergies  ?Allergen Reactions  ? Latex Anaphylaxis  ?  Depending on how long pt is touched with latex items  ? Penicillins Anaphylaxis  ? Prednisone Swelling  ? Sulfa Antibiotics Hives  ? Ceftin [Cefuroxime Axetil] Rash  ?  Tolerates Keflex  ? Doxycycline Rash  ? Other Rash  ?  ALL MYCINS  ?  ?Social History  ? ?Tobacco Use  ? Smoking status: Former  ?  Packs/day: 0.30  ?  Years: 20.00  ?  Pack years: 6.00  ?  Types: Cigarettes  ?  Quit date: 12/11/2016  ?  Years since quitting: 5.2  ? Smokeless tobacco: Never  ?Substance Use Topics  ? Alcohol use: Yes  ?  Comment: socially  ?  ?Family History  ?Problem Relation Age of Onset  ? Heart disease Father 6  ?     MI  ? Hypertension Father   ? Cancer Mother 25  ?     colon  ? Asthma Maternal Aunt   ? Diabetes Neg Hx   ? Stroke Neg Hx   ?  ? ?Review of Systems  ?Musculoskeletal:  Positive for arthralgias.  ?     Right hip  ?All other systems reviewed and are negative. ? ?Objective: ? ?Physical Exam ?Constitutional:   ?   Appearance: Normal appearance.  ?HENT:  ?   Head: Normocephalic and atraumatic.  ?   Nose: Nose normal.  ?   Mouth/Throat:  ?   Pharynx: Oropharynx is clear.  ?Eyes:  ?   Extraocular Movements: Extraocular movements intact.  ?Cardiovascular:  ?   Rate and Rhythm: Normal rate and regular rhythm.  ?Pulmonary:  ?   Effort: Pulmonary effort is  normal.  ?Abdominal:  ?   Palpations: Abdomen is soft.  ?Musculoskeletal:  ?   Comments: Examination left hip shows good range of motion.  Surgical incision is benign.  Examination right hip shows significantly limited range of motion.  She walks with an antalgic gait.  Both calves are soft and nontender.    ?Skin: ?   General: Skin is warm and dry.  ?Neurological:  ?   General: No focal deficit present.  ?  Mental Status: She is alert and oriented to person, place, and time.  ?Psychiatric:     ?   Mood and Affect: Mood normal.     ?   Behavior: Behavior normal.     ?   Thought Content: Thought content normal.     ?   Judgment: Judgment normal.  ? ? ?Vital signs in last 24 hours: ?  ? ?Labs: ? ? ?Estimated body mass index is 32.87 kg/m? as calculated from the following: ?  Height as of 03/15/22: 5' 7.5" (1.715 m). ?  Weight as of 03/15/22: 96.6 kg. ? ? ?Imaging Review ?Plain radiographs demonstrate severe degenerative joint disease of the right hip(s). The bone quality appears to be good for age and reported activity level. ? ? ? ? ? ?Assessment/Plan: ? ?End stage primary arthritis, right hip(s) ? ?The patient history, physical examination, clinical judgement of the provider and imaging studies are consistent with end stage degenerative joint disease of the right hip(s) and total hip arthroplasty is deemed medically necessary. The treatment options including medical management, injection therapy, arthroscopy and arthroplasty were discussed at length. The risks and benefits of total hip arthroplasty were presented and reviewed. The risks due to aseptic loosening, infection, stiffness, dislocation/subluxation,  thromboembolic complications and other imponderables were discussed.  The patient acknowledged the explanation, agreed to proceed with the plan and consent was signed. Patient is being admitted for inpatient treatment for surgery, pain control, PT, OT, prophylactic antibiotics, VTE prophylaxis, progressive  ambulation and ADL's and discharge planning.The patient is planning to be discharged home with home health services ? ?

## 2022-03-28 ENCOUNTER — Encounter (HOSPITAL_COMMUNITY)
Admission: RE | Disposition: A | Discharge status: Home / Self Care | Payer: Self-pay | Source: Ambulatory Visit | Attending: Orthopaedic Surgery

## 2022-03-28 ENCOUNTER — Other Ambulatory Visit: Payer: Self-pay

## 2022-03-28 ENCOUNTER — Ambulatory Visit (HOSPITAL_COMMUNITY): Payer: 59

## 2022-03-28 ENCOUNTER — Ambulatory Visit (HOSPITAL_COMMUNITY): Payer: 59 | Admitting: Physician Assistant

## 2022-03-28 ENCOUNTER — Ambulatory Visit (HOSPITAL_BASED_OUTPATIENT_CLINIC_OR_DEPARTMENT_OTHER): Payer: 59 | Admitting: Anesthesiology

## 2022-03-28 ENCOUNTER — Encounter (HOSPITAL_COMMUNITY): Payer: Self-pay | Admitting: Orthopaedic Surgery

## 2022-03-28 ENCOUNTER — Observation Stay (HOSPITAL_COMMUNITY)
Admission: RE | Admit: 2022-03-28 | Discharge: 2022-03-30 | Disposition: A | Discharge status: Home / Self Care | Payer: 59 | Source: Ambulatory Visit | Attending: Orthopaedic Surgery | Admitting: Orthopaedic Surgery

## 2022-03-28 DIAGNOSIS — E119 Type 2 diabetes mellitus without complications: Secondary | ICD-10-CM | POA: Diagnosis not present

## 2022-03-28 DIAGNOSIS — I252 Old myocardial infarction: Secondary | ICD-10-CM | POA: Diagnosis not present

## 2022-03-28 DIAGNOSIS — Z7982 Long term (current) use of aspirin: Secondary | ICD-10-CM | POA: Insufficient documentation

## 2022-03-28 DIAGNOSIS — Z96642 Presence of left artificial hip joint: Secondary | ICD-10-CM | POA: Diagnosis not present

## 2022-03-28 DIAGNOSIS — I10 Essential (primary) hypertension: Secondary | ICD-10-CM | POA: Insufficient documentation

## 2022-03-28 DIAGNOSIS — Z87891 Personal history of nicotine dependence: Secondary | ICD-10-CM | POA: Insufficient documentation

## 2022-03-28 DIAGNOSIS — Z91040 Latex allergy status: Secondary | ICD-10-CM | POA: Insufficient documentation

## 2022-03-28 DIAGNOSIS — M1611 Unilateral primary osteoarthritis, right hip: Secondary | ICD-10-CM | POA: Diagnosis present

## 2022-03-28 DIAGNOSIS — Z79899 Other long term (current) drug therapy: Secondary | ICD-10-CM | POA: Diagnosis not present

## 2022-03-28 HISTORY — PX: TOTAL HIP ARTHROPLASTY: SHX124

## 2022-03-28 LAB — GLUCOSE, CAPILLARY: Glucose-Capillary: 86 mg/dL (ref 70–99)

## 2022-03-28 SURGERY — ARTHROPLASTY, HIP, TOTAL, ANTERIOR APPROACH
Anesthesia: Spinal | Site: Hip | Laterality: Right

## 2022-03-28 MED ORDER — LACTATED RINGERS IV SOLN: INTRAVENOUS | Status: DC

## 2022-03-28 MED ORDER — MENTHOL 3 MG MT LOZG: 1.0000 | LOZENGE | OROMUCOSAL | Status: DC | PRN

## 2022-03-28 MED ORDER — ACETAMINOPHEN 500 MG PO TABS
500.0000 mg | ORAL_TABLET | Freq: Four times a day (QID) | ORAL | Status: AC
  Administered 2022-03-29: 500 mg via ORAL
  Filled 2022-03-28 (×2): qty 1

## 2022-03-28 MED ORDER — DIPHENHYDRAMINE HCL 12.5 MG/5ML PO ELIX: 12.5000 mg | ORAL_SOLUTION | ORAL | Status: DC | PRN

## 2022-03-28 MED ORDER — TRANEXAMIC ACID-NACL 1000-0.7 MG/100ML-% IV SOLN
1000.0000 mg | Freq: Once | INTRAVENOUS | Status: AC
  Administered 2022-03-28: 1000 mg via INTRAVENOUS
  Filled 2022-03-28: qty 100

## 2022-03-28 MED ORDER — BUPIVACAINE IN DEXTROSE 0.75-8.25 % IT SOLN
INTRATHECAL | Status: DC | PRN
  Administered 2022-03-28: 1.8 mL via INTRATHECAL

## 2022-03-28 MED ORDER — AMISULPRIDE (ANTIEMETIC) 5 MG/2ML IV SOLN: 10.0000 mg | Freq: Once | INTRAVENOUS | Status: DC | PRN

## 2022-03-28 MED ORDER — PROPOFOL 500 MG/50ML IV EMUL
INTRAVENOUS | Status: AC
  Filled 2022-03-28: qty 50

## 2022-03-28 MED ORDER — DEXAMETHASONE SODIUM PHOSPHATE 10 MG/ML IJ SOLN
INTRAMUSCULAR | Status: AC
  Filled 2022-03-28: qty 1

## 2022-03-28 MED ORDER — TRANEXAMIC ACID-NACL 1000-0.7 MG/100ML-% IV SOLN
1000.0000 mg | INTRAVENOUS | Status: DC
Start: 2022-03-28 — End: 2022-03-28
  Filled 2022-03-28: qty 100

## 2022-03-28 MED ORDER — FENTANYL CITRATE PF 50 MCG/ML IJ SOSY: 25.0000 ug | PREFILLED_SYRINGE | INTRAMUSCULAR | Status: DC | PRN

## 2022-03-28 MED ORDER — ALUM & MAG HYDROXIDE-SIMETH 200-200-20 MG/5ML PO SUSP: 30.0000 mL | ORAL | Status: DC | PRN

## 2022-03-28 MED ORDER — FAMOTIDINE 20 MG PO TABS
40.0000 mg | ORAL_TABLET | Freq: Every day | ORAL | Status: DC | PRN
Start: 2022-03-28 — End: 2022-03-30

## 2022-03-28 MED ORDER — GABAPENTIN 300 MG PO CAPS
300.0000 mg | ORAL_CAPSULE | Freq: Three times a day (TID) | ORAL | Status: DC | PRN
  Administered 2022-03-29: 600 mg via ORAL
  Filled 2022-03-28: qty 2

## 2022-03-28 MED ORDER — PHENYLEPHRINE HCL-NACL 20-0.9 MG/250ML-% IV SOLN
INTRAVENOUS | Status: DC | PRN
  Administered 2022-03-28: 40 ug/min via INTRAVENOUS

## 2022-03-28 MED ORDER — ISOSORBIDE MONONITRATE ER 30 MG PO TB24
30.0000 mg | ORAL_TABLET | Freq: Every day | ORAL | Status: DC
  Administered 2022-03-28 – 2022-03-29 (×2): 30 mg via ORAL
  Filled 2022-03-28 (×2): qty 1

## 2022-03-28 MED ORDER — POVIDONE-IODINE 10 % EX SWAB
2.0000 "application " | Freq: Once | CUTANEOUS | Status: AC
  Administered 2022-03-28: 2 via TOPICAL

## 2022-03-28 MED ORDER — 0.9 % SODIUM CHLORIDE (POUR BTL) OPTIME
TOPICAL | Status: DC | PRN
  Administered 2022-03-28: 1000 mL

## 2022-03-28 MED ORDER — BUPIVACAINE LIPOSOME 1.3 % IJ SUSP: 10.0000 mL | Freq: Once | INTRAMUSCULAR | Status: DC

## 2022-03-28 MED ORDER — METHOCARBAMOL 500 MG PO TABS
500.0000 mg | ORAL_TABLET | Freq: Four times a day (QID) | ORAL | Status: DC | PRN
  Administered 2022-03-29 – 2022-03-30 (×5): 500 mg via ORAL
  Filled 2022-03-28 (×5): qty 1

## 2022-03-28 MED ORDER — PROPOFOL 500 MG/50ML IV EMUL
INTRAVENOUS | Status: DC | PRN
  Administered 2022-03-28: 125 ug/kg/min via INTRAVENOUS

## 2022-03-28 MED ORDER — METOCLOPRAMIDE HCL 5 MG PO TABS: 5.0000 mg | ORAL_TABLET | Freq: Three times a day (TID) | ORAL | Status: DC | PRN

## 2022-03-28 MED ORDER — VANCOMYCIN HCL IN DEXTROSE 1-5 GM/200ML-% IV SOLN
1000.0000 mg | Freq: Two times a day (BID) | INTRAVENOUS | Status: AC
  Administered 2022-03-29: 1000 mg via INTRAVENOUS
  Filled 2022-03-28: qty 200

## 2022-03-28 MED ORDER — POTASSIUM CHLORIDE CRYS ER 20 MEQ PO TBCR
40.0000 meq | EXTENDED_RELEASE_TABLET | Freq: Every morning | ORAL | Status: DC
  Administered 2022-03-29 – 2022-03-30 (×2): 40 meq via ORAL
  Filled 2022-03-28 (×2): qty 2

## 2022-03-28 MED ORDER — PROPOFOL 1000 MG/100ML IV EMUL
INTRAVENOUS | Status: AC
  Filled 2022-03-28: qty 100

## 2022-03-28 MED ORDER — MORPHINE SULFATE (PF) 2 MG/ML IV SOLN
0.5000 mg | INTRAVENOUS | Status: DC | PRN
  Administered 2022-03-28 (×2): 1 mg via INTRAVENOUS
  Filled 2022-03-28 (×2): qty 1

## 2022-03-28 MED ORDER — ONDANSETRON HCL 4 MG/2ML IJ SOLN
INTRAMUSCULAR | Status: DC | PRN
  Administered 2022-03-28: 4 mg via INTRAVENOUS

## 2022-03-28 MED ORDER — TRANEXAMIC ACID 1000 MG/10ML IV SOLN
2000.0000 mg | INTRAVENOUS | Status: DC
  Filled 2022-03-28: qty 20

## 2022-03-28 MED ORDER — ONDANSETRON HCL 4 MG/2ML IJ SOLN: 4.0000 mg | Freq: Four times a day (QID) | INTRAMUSCULAR | Status: DC | PRN

## 2022-03-28 MED ORDER — BISACODYL 5 MG PO TBEC: 5.0000 mg | DELAYED_RELEASE_TABLET | Freq: Every day | ORAL | Status: DC | PRN

## 2022-03-28 MED ORDER — METHOCARBAMOL 500 MG IVPB - SIMPLE MED
500.0000 mg | Freq: Four times a day (QID) | INTRAVENOUS | Status: DC | PRN
  Filled 2022-03-28 (×2): qty 50

## 2022-03-28 MED ORDER — ACETAMINOPHEN 325 MG PO TABS: 325.0000 mg | ORAL_TABLET | Freq: Four times a day (QID) | ORAL | Status: DC | PRN

## 2022-03-28 MED ORDER — BUPIVACAINE LIPOSOME 1.3 % IJ SUSP
INTRAMUSCULAR | Status: DC | PRN
  Administered 2022-03-28: 10 mL

## 2022-03-28 MED ORDER — CHLORHEXIDINE GLUCONATE 0.12 % MT SOLN
15.0000 mL | Freq: Once | OROMUCOSAL | Status: AC
  Administered 2022-03-28: 15 mL via OROMUCOSAL

## 2022-03-28 MED ORDER — PHENOL 1.4 % MT LIQD: 1.0000 | OROMUCOSAL | Status: DC | PRN

## 2022-03-28 MED ORDER — DEXAMETHASONE SODIUM PHOSPHATE 10 MG/ML IJ SOLN
INTRAMUSCULAR | Status: DC | PRN
  Administered 2022-03-28: 5 mg via INTRAVENOUS

## 2022-03-28 MED ORDER — NITROGLYCERIN 0.4 MG SL SUBL: 0.4000 mg | SUBLINGUAL_TABLET | SUBLINGUAL | Status: DC | PRN

## 2022-03-28 MED ORDER — ONDANSETRON HCL 4 MG/2ML IJ SOLN
INTRAMUSCULAR | Status: AC
  Filled 2022-03-28: qty 2

## 2022-03-28 MED ORDER — TOPIRAMATE 100 MG PO TABS
100.0000 mg | ORAL_TABLET | Freq: Two times a day (BID) | ORAL | Status: DC
  Administered 2022-03-28 – 2022-03-30 (×4): 100 mg via ORAL
  Filled 2022-03-28 (×4): qty 1

## 2022-03-28 MED ORDER — BUPIVACAINE-EPINEPHRINE (PF) 0.25% -1:200000 IJ SOLN
INTRAMUSCULAR | Status: AC
  Filled 2022-03-28: qty 30

## 2022-03-28 MED ORDER — DOCUSATE SODIUM 100 MG PO CAPS
100.0000 mg | ORAL_CAPSULE | Freq: Two times a day (BID) | ORAL | Status: DC
  Administered 2022-03-28 – 2022-03-30 (×4): 100 mg via ORAL
  Filled 2022-03-28 (×4): qty 1

## 2022-03-28 MED ORDER — BUPIVACAINE-EPINEPHRINE (PF) 0.25% -1:200000 IJ SOLN
INTRAMUSCULAR | Status: DC | PRN
  Administered 2022-03-28: 30 mL

## 2022-03-28 MED ORDER — TRANEXAMIC ACID 1000 MG/10ML IV SOLN
INTRAVENOUS | Status: DC | PRN
  Administered 2022-03-28: 2000 mg via TOPICAL

## 2022-03-28 MED ORDER — ASPIRIN 81 MG PO CHEW
81.0000 mg | CHEWABLE_TABLET | Freq: Two times a day (BID) | ORAL | Status: DC
  Administered 2022-03-29 – 2022-03-30 (×3): 81 mg via ORAL
  Filled 2022-03-28 (×3): qty 1

## 2022-03-28 MED ORDER — ACETAMINOPHEN 500 MG PO TABS
1000.0000 mg | ORAL_TABLET | Freq: Once | ORAL | Status: AC
Start: 2022-03-28 — End: 2022-03-28
  Administered 2022-03-28: 1000 mg via ORAL
  Filled 2022-03-28: qty 2

## 2022-03-28 MED ORDER — FUROSEMIDE 40 MG PO TABS
80.0000 mg | ORAL_TABLET | Freq: Two times a day (BID) | ORAL | Status: DC
  Administered 2022-03-29 – 2022-03-30 (×3): 80 mg via ORAL
  Filled 2022-03-28 (×3): qty 2

## 2022-03-28 MED ORDER — PROPOFOL 10 MG/ML IV BOLUS
INTRAVENOUS | Status: DC | PRN
  Administered 2022-03-28: 30 mg via INTRAVENOUS

## 2022-03-28 MED ORDER — KETOROLAC TROMETHAMINE 15 MG/ML IJ SOLN
7.5000 mg | Freq: Four times a day (QID) | INTRAMUSCULAR | Status: AC
  Administered 2022-03-28 – 2022-03-29 (×4): 7.5 mg via INTRAVENOUS
  Filled 2022-03-28 (×4): qty 1

## 2022-03-28 MED ORDER — VANCOMYCIN HCL IN DEXTROSE 1-5 GM/200ML-% IV SOLN
1000.0000 mg | INTRAVENOUS | Status: AC
  Administered 2022-03-28: 1000 mg via INTRAVENOUS
  Filled 2022-03-28: qty 200

## 2022-03-28 MED ORDER — HYDROCODONE-ACETAMINOPHEN 7.5-325 MG PO TABS
1.0000 | ORAL_TABLET | ORAL | Status: DC | PRN
  Administered 2022-03-28 (×2): 2 via ORAL
  Administered 2022-03-29 – 2022-03-30 (×6): 1 via ORAL
  Filled 2022-03-28 (×5): qty 1
  Filled 2022-03-28: qty 2
  Filled 2022-03-28: qty 1
  Filled 2022-03-28: qty 2

## 2022-03-28 MED ORDER — ORAL CARE MOUTH RINSE: 15.0000 mL | Freq: Once | OROMUCOSAL | Status: AC

## 2022-03-28 MED ORDER — BUPIVACAINE LIPOSOME 1.3 % IJ SUSP
INTRAMUSCULAR | Status: AC
  Filled 2022-03-28: qty 10

## 2022-03-28 MED ORDER — HYDROCODONE-ACETAMINOPHEN 5-325 MG PO TABS
1.0000 | ORAL_TABLET | ORAL | Status: DC | PRN
  Administered 2022-03-29 (×2): 2 via ORAL
  Filled 2022-03-28 (×2): qty 2

## 2022-03-28 MED ORDER — FENTANYL CITRATE (PF) 100 MCG/2ML IJ SOLN
INTRAMUSCULAR | Status: DC | PRN
  Administered 2022-03-28 (×2): 50 ug via INTRAVENOUS

## 2022-03-28 MED ORDER — FENTANYL CITRATE (PF) 100 MCG/2ML IJ SOLN
INTRAMUSCULAR | Status: AC
  Filled 2022-03-28: qty 2

## 2022-03-28 MED ORDER — METOCLOPRAMIDE HCL 5 MG/ML IJ SOLN: 5.0000 mg | Freq: Three times a day (TID) | INTRAMUSCULAR | Status: DC | PRN

## 2022-03-28 MED ORDER — ONDANSETRON HCL 4 MG PO TABS: 4.0000 mg | ORAL_TABLET | Freq: Four times a day (QID) | ORAL | Status: DC | PRN

## 2022-03-28 SURGICAL SUPPLY — 50 items
BAG COUNTER SPONGE SURGICOUNT (BAG) ×1 IMPLANT
BAG DECANTER FOR FLEXI CONT (MISCELLANEOUS) ×2 IMPLANT
BAG SPNG CNTER NS LX DISP (BAG) ×1
BALL HIP CERAMIC 32MM PLUS 9 IMPLANT
BLADE SAGITTAL 25.0X1.37X90 (BLADE) ×1 IMPLANT
BLADE SAW SGTL 18X1.27X75 (BLADE) ×2 IMPLANT
BOOTIES KNEE HIGH SLOAN (MISCELLANEOUS) ×2 IMPLANT
CELLS DAT CNTRL 66122 CELL SVR (MISCELLANEOUS) ×1 IMPLANT
COVER PERINEAL POST (MISCELLANEOUS) ×2 IMPLANT
COVER SURGICAL LIGHT HANDLE (MISCELLANEOUS) ×2 IMPLANT
CUP SECTOR GRIPTON 50MM (Cup) ×1 IMPLANT
DRAPE FOOT SWITCH (DRAPES) ×2 IMPLANT
DRAPE IMP U-DRAPE 54X76 (DRAPES) ×2 IMPLANT
DRAPE STERI IOBAN 125X83 (DRAPES) ×2 IMPLANT
DRAPE U-SHAPE 47X51 STRL (DRAPES) ×4 IMPLANT
DRESSING AQUACEL AG SP 3.5X6 (GAUZE/BANDAGES/DRESSINGS) IMPLANT
DRSG AQUACEL AG ADV 3.5X 6 (GAUZE/BANDAGES/DRESSINGS) ×2 IMPLANT
DRSG AQUACEL AG SP 3.5X6 (GAUZE/BANDAGES/DRESSINGS) ×2
DURAPREP 26ML APPLICATOR (WOUND CARE) ×2 IMPLANT
ELECT BLADE TIP CTD 4 INCH (ELECTRODE) ×2 IMPLANT
ELECT REM PT RETURN 15FT ADLT (MISCELLANEOUS) ×2 IMPLANT
GLOVE BIO SURGEON STRL SZ8 (GLOVE) ×2 IMPLANT
GLOVE BIOGEL PI IND STRL 8 (GLOVE) ×2 IMPLANT
GLOVE BIOGEL PI INDICATOR 8 (GLOVE) ×2
GLOVE SURG SS PI 8.0 STRL IVOR (GLOVE) ×2 IMPLANT
GOWN STRL REUS W/ TWL XL LVL3 (GOWN DISPOSABLE) ×2 IMPLANT
GOWN STRL REUS W/TWL XL LVL3 (GOWN DISPOSABLE) ×4
HIP BALL CERAMIC 32MM PLUS 9 ×2 IMPLANT
HOLDER FOLEY CATH W/STRAP (MISCELLANEOUS) ×2 IMPLANT
KIT TURNOVER KIT A (KITS) ×1 IMPLANT
LINER ACET PNNCL PLUS4 NEUTRAL (Hips) IMPLANT
MANIFOLD NEPTUNE II (INSTRUMENTS) ×2 IMPLANT
NEEDLE HYPO 22GX1.5 SAFETY (NEEDLE) ×2 IMPLANT
NS IRRIG 1000ML POUR BTL (IV SOLUTION) ×2 IMPLANT
PACK ANTERIOR HIP CUSTOM (KITS) ×2 IMPLANT
PENCIL SMOKE EVACUATOR (MISCELLANEOUS) IMPLANT
PINNACLE PLUS 4 NEUTRAL (Hips) ×2 IMPLANT
PROTECTOR NERVE ULNAR (MISCELLANEOUS) ×2 IMPLANT
RETRACTOR WND ALEXIS 18 MED (MISCELLANEOUS) ×1 IMPLANT
RTRCTR WOUND ALEXIS 18CM MED (MISCELLANEOUS) ×2
SPIKE FLUID TRANSFER (MISCELLANEOUS) ×2 IMPLANT
STEM FEM ACTIS HIGH SZ3 (Stem) ×1 IMPLANT
SUT ETHIBOND NAB CT1 #1 30IN (SUTURE) ×4 IMPLANT
SUT VIC AB 1 CT1 36 (SUTURE) ×2 IMPLANT
SUT VIC AB 2-0 CT1 27 (SUTURE) ×2
SUT VIC AB 2-0 CT1 TAPERPNT 27 (SUTURE) ×1 IMPLANT
SUT VICRYL AB 3-0 FS1 BRD 27IN (SUTURE) ×2 IMPLANT
SUT VLOC 180 0 24IN GS25 (SUTURE) ×2 IMPLANT
SYR 50ML LL SCALE MARK (SYRINGE) ×2 IMPLANT
TRAY FOLEY MTR SLVR 16FR STAT (SET/KITS/TRAYS/PACK) ×2 IMPLANT

## 2022-03-28 NOTE — Op Note (Signed)
PRE-OP DIAGNOSIS:  RIGHT HIP DEGENERATIVE JOINT DISEASE ?POST-OP DIAGNOSIS:  same ?PROCEDURE: RIGHT TOTAL HIP ARTHROPLASTY ANTERIOR APPROACH ?ANESTHESIA:  Spinal and MAC ?SURGEON:  Melrose Nakayama MD ?ASSISTANT:  Loni Dolly PA-C ? ? ?INDICATIONS FOR PROCEDURE:  The patient is a 61 y.o. female with a long history of a painful hip.  This has persisted despite multiple conservative measures.  The patient has persisted with pain and dysfunction making rest and activity difficult.  A total hip replacement is offered as surgical treatment.  Informed operative consent was obtained after discussion of possible complications including reaction to anesthesia, infection, neurovascular injury, dislocation, DVT, PE, and death.  The importance of the postoperative rehab program to optimize result was stressed with the patient. ? ?SUMMARY OF FINDINGS AND PROCEDURE:  Under the above anesthesia through a anterior approach an the Hana table a right THR was performed.  The patient had severe degenerative change and poor bone quality.  We used DePuy components to replace the hip and these were size 3 high offset Actis femur capped with a +9 21m ceramic hip ball.  On the acetabular side we used a size 50 three hole Gription shell with a  plus 4 neutral polyethylene liner.  We did use a hole eliminator.  ALoni DollyPA-C assisted throughout and was invaluable to the completion of the case in that he helped position and retract while I performed the procedure.  He also closed simultaneously to help minimize OR time.  I used fluoroscopy throughout the case to check position of implants and leg lengths and read all of these views myself. ? ?DESCRIPTION OF PROCEDURE:  The patient was taken to the OR suite where the above anesthetic was applied.  The patient was then positioned on the Hana table supine.  All bony prominences were appropriately padded.  Prep and drape was then performed in normal sterile fashion.  The patient was given  vancomycin preoperative antibiotic and an appropriate time out was performed.  We then took an anterior approach to the right hip.  Dissection was taken through adipose to the tensor fascia lata fascia.  This structure was incised longitudinally and we dissected in the intermuscular interval just medial to this muscle.  Cobra retractors were placed superior and inferior to the femoral neck superficial to the capsule.  A capsular incision was then made and the retractors were placed along the femoral neck.  Xray was brought in to get a good level for the femoral neck cut which was made with an oscillating saw and osteotome.  The femoral head was removed with a corkscrew.  The acetabulum was exposed and some labral tissues were excised. Reaming was taken to the inside wall of the pelvis and sequentially up to 1 mm smaller than the actual component.  A trial of components was done and then the aforementioned acetabular shell was placed in appropriate tilt and anteversion confirmed by fluoroscopy. The liner was placed along with the hole eliminator and attention was turned to the femur.  The leg was brought down and over into adduction and the elevator bar was used to raise the femur up gently in the wound.  The piriformis was released with care taken to preserve the obturator internus attachment and all of the posterior capsule. The femur was reamed and then broached to the appropriate size.  A trial reduction was done and the aforementioned head and neck assembly gave uKoreathe best stability in extension with external rotation.  Leg lengths were felt  to be about equal by fluoroscopic exam.  The trial components were removed and the wound irrigated.  We then placed the femoral component in appropriate anteversion.  The head was applied to a dry stem neck and the hip again reduced.  It was again stable in the aforementioned position.  The would was irrigated again followed by re-approximation of anterior capsule with  ethibond suture. Tensor fascia was repaired with V-loc suture  followed by deep closure with #O and #2 undyed vicryl.  Skin was closed with subQ stitch and steristrips followed by a sterile dressing.  EBL and IOF can be obtained from anesthesia records. ? ?DISPOSITION:  The patient was taken to PACU in stable condition to potentially go home same day depending on ability to walk and tolerate liquids.     ?

## 2022-03-28 NOTE — Anesthesia Preprocedure Evaluation (Addendum)
Anesthesia Evaluation  ?Patient identified by MRN, date of birth, ID band ?Patient awake ? ? ? ?Reviewed: ?Allergy & Precautions, NPO status , Patient's Chart, lab work & pertinent test results ? ?History of Anesthesia Complications ?Negative for: history of anesthetic complications ? ?Airway ?Mallampati: II ? ?TM Distance: >3 FB ?Neck ROM: Full ? ? ? Dental ? ?(+) Dental Advisory Given, Edentulous Lower, Edentulous Upper ?  ?Pulmonary ?former smoker,  ?  ?Pulmonary exam normal ? ? ? ? ? ? ? Cardiovascular ?hypertension, Pt. on medications ?+ CAD  ?Normal cardiovascular exam ? ?Echo 11/10/2018 ?Study Conclusions  ? ?- Left ventricle: The cavity size was normal. Wall thickness was  ???increased in a pattern of mild LVH. Systolic function was normal.  ???The estimated ejection fraction was in the range of 55% to 60%.  ???Wall motion was normal; there were no regional wall motion  ???abnormalities. Left ventricular diastolic function parameters  ???were normal.  ? ?Impressions:  ? ?- Normal LV function; mild LVH; mild TR. ?  ?Neuro/Psych ?negative neurological ROS ?   ? GI/Hepatic ?negative GI ROS, Neg liver ROS,   ?Endo/Other  ?diabetes, Type 2 ? Renal/GU ?negative Renal ROS  ? ?  ?Musculoskeletal ? ?(+) Arthritis ,  ? Abdominal ?  ?Peds ? Hematology ? ?(+) Blood dyscrasia, anemia ,   ?Anesthesia Other Findings ? ? Reproductive/Obstetrics ? ?  ? ? ? ? ? ? ? ? ? ? ? ? ? ?  ?  ? ? ? ? ? ? ?Lab Results  ?Component Value Date  ? WBC 4.1 03/15/2022  ? HGB 11.2 (L) 03/15/2022  ? HCT 38.2 03/15/2022  ? MCV 77.6 (L) 03/15/2022  ? PLT 301 03/15/2022  ? ?Lab Results  ?Component Value Date  ? CREATININE 0.59 03/15/2022  ? BUN 11 03/15/2022  ? NA 139 03/15/2022  ? K 3.8 03/15/2022  ? CL 107 03/15/2022  ? CO2 25 03/15/2022  ? ? ?Anesthesia Physical ? ?Anesthesia Plan ? ?ASA: 2 ? ?Anesthesia Plan: Spinal  ? ?Post-op Pain Management: Tylenol PO (pre-op)  ? ?Induction:  ? ?PONV Risk Score and Plan: 2  and Propofol infusion, Dexamethasone, Ondansetron and Treatment may vary due to age or medical condition ? ?Airway Management Planned: Natural Airway and Simple Face Mask ? ?Additional Equipment:  ? ?Intra-op Plan:  ? ?Post-operative Plan:  ? ?Informed Consent: I have reviewed the patients History and Physical, chart, labs and discussed the procedure including the risks, benefits and alternatives for the proposed anesthesia with the patient or authorized representative who has indicated his/her understanding and acceptance.  ? ? ? ?Dental advisory given ? ?Plan Discussed with: Anesthesiologist and CRNA ? ?Anesthesia Plan Comments:   ? ? ? ? ? ?Anesthesia Quick Evaluation ? ?

## 2022-03-28 NOTE — Transfer of Care (Signed)
Immediate Anesthesia Transfer of Care Note ? ?Patient: Lindsay [name] ? ?Procedure(s) Performed: RIGH TOTAL HIP ARTHROPLASTY ANTERIOR APPROACH (Right: Hip) ? ?Patient Location: PACU ? ?Anesthesia Type:Spinal ? ?Level of Consciousness: awake and patient cooperative ? ?Airway & Oxygen Therapy: Patient Spontanous Breathing and Patient connected to face mask ? ?Post-op Assessment: Report given to RN and Post -op Vital signs reviewed and stable ? ?Post vital signs: Reviewed and stable ? ?Last Vitals:  ?Vitals Value Taken Time  ?BP    ?Temp    ?Pulse    ?Resp    ?SpO2    ? ? ?Last Pain:  ?Vitals:  ? 03/28/22 1040  ?TempSrc:   ?PainSc: 6   ?   ? ?  ? ?Complications: No notable events documented. ?

## 2022-03-28 NOTE — Anesthesia Procedure Notes (Signed)
Spinal ? ?Patient location during procedure: OR ?Start time: 03/28/2022 12:48 PM ?End time: 03/28/2022 12:54 PM ?Reason for block: surgical anesthesia ?Staffing ?Performed: anesthesiologist  ?Anesthesiologist: Duane Boston, MD ?Preanesthetic Checklist ?Completed: patient identified, IV checked, risks and benefits discussed, surgical consent, monitors and equipment checked, pre-op evaluation and timeout performed ?Spinal Block ?Patient position: sitting ?Prep: DuraPrep ?Patient monitoring: cardiac monitor, continuous pulse ox and blood pressure ?Approach: midline ?Location: L2-3 ?Injection technique: single-shot ?Needle ?Needle type: Pencan  ?Needle gauge: 24 G ?Needle length: 9 cm ?Assessment ?Events: CSF return ?Additional Notes ?Functioning IV was confirmed and monitors were applied. Sterile prep and drape, including hand hygiene and sterile gloves were used. The patient was positioned and the spine was prepped. The skin was anesthetized with lidocaine.  Free flow of clear CSF was obtained prior to injecting local anesthetic into the CSF.  The spinal needle aspirated freely following injection.  The needle was carefully withdrawn.  The patient tolerated the procedure well.  ? ? ? ?

## 2022-03-28 NOTE — Interval H&P Note (Signed)
History and Physical Interval Note: ? ?03/28/2022 ?11:37 AM ? ?Lindsay [name]  has presented today for surgery, with the diagnosis of RIGHT HIP DEGENERATIVE JOINT DISEASE.  The various methods of treatment have been discussed with the patient and family. After consideration of risks, benefits and other options for treatment, the patient has consented to  Procedure(s): ?RIGH TOTAL HIP ARTHROPLASTY ANTERIOR APPROACH (Right) as a surgical intervention.  The patient's history has been reviewed, patient examined, no change in status, stable for surgery.  I have reviewed the patient's chart and labs.  Questions were answered to the patient's satisfaction.   ? ? ?Lindsay [name] ? ? ?

## 2022-03-28 NOTE — Anesthesia Procedure Notes (Signed)
Procedure Name: [place] ?Date/Time: 03/28/2022 1:07 PM ?Performed by: Claudia Desanctis, CRNA ?Pre-anesthesia Checklist: Patient identified, Emergency Drugs available, Suction available and Patient being monitored ?Patient Re-evaluated:Patient Re-evaluated prior to induction ?Oxygen Delivery Method: Simple face mask ? ? ? ? ?

## 2022-03-29 ENCOUNTER — Encounter (HOSPITAL_COMMUNITY): Payer: Self-pay | Admitting: Orthopaedic Surgery

## 2022-03-29 DIAGNOSIS — M1611 Unilateral primary osteoarthritis, right hip: Secondary | ICD-10-CM | POA: Diagnosis not present

## 2022-03-29 LAB — GLUCOSE, CAPILLARY
Glucose-Capillary: 98 mg/dL (ref 70–99)
Glucose-Capillary: 91 mg/dL (ref 70–99)

## 2022-03-29 LAB — BASIC METABOLIC PANEL WITH GFR
Anion gap: 5 (ref 5–15)
BUN: 8 mg/dL (ref 8–23)
CO2: 27 mmol/L (ref 22–32)
Calcium: 8.6 mg/dL — ABNORMAL LOW (ref 8.9–10.3)
Chloride: 108 mmol/L (ref 98–111)
Creatinine, Ser: 0.56 mg/dL (ref 0.44–1.00)
GFR, Estimated: >60 mL/min
Glucose, Bld: 121 mg/dL — ABNORMAL HIGH (ref 70–99)
Potassium: 3.5 mmol/L (ref 3.5–5.1)
Sodium: 140 mmol/L (ref 135–145)

## 2022-03-29 LAB — CBC
HCT: 29.2 % — ABNORMAL LOW (ref 36.0–46.0)
Hemoglobin: 8.7 g/dL — ABNORMAL LOW (ref 12.0–15.0)
MCH: 22.8 pg — ABNORMAL LOW (ref 26.0–34.0)
MCHC: 29.8 g/dL — ABNORMAL LOW (ref 30.0–36.0)
MCV: 76.6 fL — ABNORMAL LOW (ref 80.0–100.0)
Platelets: 290 10*3/uL (ref 150–400)
RBC: 3.81 MIL/uL — ABNORMAL LOW (ref 3.87–5.11)
RDW: 17.3 % — ABNORMAL HIGH (ref 11.5–15.5)
WBC: 6.3 10*3/uL (ref 4.0–10.5)
nRBC: 0 % (ref 0.0–0.2)

## 2022-03-29 NOTE — Evaluation (Signed)
Physical Therapy Evaluation ?Patient Details ?Name: Lindsay [name] ?MRN: [id number] ?DOB: 07-05-1961 ?Today's Date: 03/29/2022 ? ?History of Present Illness ? Pt is 61 yo female s/p R anterior THA on 03/28/22.  Pt reports significant pain, popping, and detoriation in joint.  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  ?Clinical Impression ? Pt is s/p THA resulting in the deficits listed below (see PT Problem List). At baseline, pt was ambulatory with RW due to R hip pain.  She lives alone but has arranged for assist.  Today, pt motivated but limited by pain.  She did ambulate 4' but with very antalgic pattern and heavy reliance on RW and sliding feet.  Pt needs further therapy prior to d/c home.  Pt will benefit from skilled PT to increase their independence and safety with mobility to allow discharge to the venue listed below.  ?   ?   ? ?Recommendations for follow up therapy are one component of a multi-disciplinary discharge planning process, led by the attending physician.  Recommendations may be updated based on patient status, additional functional criteria and insurance authorization. ? ?Follow Up Recommendations Follow physician's recommendations for discharge plan and follow up therapies ? ?  ?Assistance Recommended at Discharge Frequent or constant Supervision/Assistance  ?Patient can return home with the following ? A lot of help with walking and/or transfers;A lot of help with bathing/dressing/bathroom;Assistance with cooking/housework;Help with stairs or ramp for entrance ? ?  ?Equipment Recommendations None recommended by PT  ?Recommendations for Other Services ?    ?  ?Functional Status Assessment Patient has had a recent decline in their functional status and demonstrates the ability to make significant improvements in function in a reasonable and predictable amount of time.  ? ?  ?Precautions / Restrictions Precautions ?Precautions: Fall ?Restrictions ?Weight  Bearing Restrictions: Yes ?RLE Weight Bearing: Weight bearing as tolerated  ? ?  ? ?Mobility ? Bed Mobility ?Overal bed mobility: Needs Assistance ?  ?  ?  ?  ?  ?  ?General bed mobility comments: in chair at arrival ?  ? ?Transfers ?Overall transfer level: Needs assistance ?Equipment used: Rolling walker (2 wheels) ?Transfers: Sit to/from Stand ?Sit to Stand: Mod assist ?  ?  ?  ?  ?  ?General transfer comment: Cues for hand placement and R LE management ?  ? ?Ambulation/Gait ?Ambulation/Gait assistance: Min assist ?Gait Distance (Feet): 40 Feet ?Assistive device: Rolling walker (2 wheels) ?Gait Pattern/deviations: Step-to pattern, Decreased stride length, Decreased weight shift to right, Trunk flexed, Shuffle, Antalgic ?Gait velocity: decreased ?  ?  ?General Gait Details: Pt wearing bedroom shoes and sliding feet.  She relies heavily on RW for decreasing weight on R LE and tends to drag R leg behind.  Very antalgic ? ?Stairs ?  ?  ?  ?  ?  ? ?Wheelchair Mobility ?  ? ?Modified Rankin (Stroke Patients Only) ?  ? ?  ? ?Balance Overall balance assessment: Needs assistance ?Sitting-balance support: No upper extremity supported ?Sitting balance-Leahy Scale: Good ?  ?  ?Standing balance support: Bilateral upper extremity supported, Reliant on assistive device for balance ?Standing balance-Leahy Scale: Poor ?Standing balance comment: requiring RW ?  ?  ?  ?  ?  ?  ?  ?  ?  ?  ?  ?   ? ? ? ?Pertinent Vitals/Pain Pain Assessment ?Pain Assessment: 0-10 ?Pain Score: 7  ?Pain Location: R hip ?Pain Descriptors / Indicators: Discomfort,  Sore ?Pain Intervention(s): Limited activity within patient's tolerance, Monitored during session, Premedicated before session, Ice applied  ? ? ?Home Living Family/patient expects to be discharged to:: Private residence ?Living Arrangements: Alone ?Available Help at Discharge: Friend(s);Available 24 hours/day (has arranged for someone to stay with her) ?Type of Home:  (condo) ?Home Access:  Stairs to enter ?Entrance Stairs-Rails: None ?Entrance Stairs-Number of Steps: curb ?  ?Home Layout: One level ?Home Equipment: Conservation officer, nature (2 wheels);Toilet riser ?   ?  ?Prior Function Prior Level of Function : Independent/Modified Independent;Driving ?  ?  ?  ?  ?  ?  ?Mobility Comments: Using a RW due to R hip pain; could ambulate short community distances ?ADLs Comments: pt reports limited with ADLs/IADLs, takes many rest breaks as needed due to pain in hips ?  ? ? ?Hand Dominance  ?   ? ?  ?Extremity/Trunk Assessment  ? Upper Extremity Assessment ?Upper Extremity Assessment: Overall WFL for tasks assessed ?  ? ?Lower Extremity Assessment ?Lower Extremity Assessment: LLE deficits/detail;RLE deficits/detail ?RLE Deficits / Details: Expected post op changes; ROM WFL but limited by pain at hip; MMT: ankle 5/5, knee 3/5, hip 1/5 ?LLE Deficits / Details: ROM WFL; MMT grossly 4+-5/5 throughout ?  ? ?Cervical / Trunk Assessment ?Cervical / Trunk Assessment: Normal  ?Communication  ? Communication: No difficulties  ?Cognition Arousal/Alertness: Awake/alert ?Behavior During Therapy: [facility] for tasks assessed/performed ?Overall Cognitive Status: Within Functional Limits for tasks assessed ?  ?  ?  ?  ?  ?  ?  ?  ?  ?  ?  ?  ?  ?  ?  ?  ?  ?  ?  ? ?  ?General Comments   ? ?  ?Exercises    ? ?Assessment/Plan  ?  ?PT Assessment Patient needs continued PT services  ?PT Problem List Decreased strength;Decreased mobility;Decreased range of motion;Decreased activity tolerance;Decreased balance;Decreased knowledge of use of DME;Pain ? ?   ?  ?PT Treatment Interventions DME instruction;Therapeutic activities;Modalities;Gait training;Therapeutic exercise;Patient/family education;Stair training;Balance training;Functional mobility training   ? ?PT Goals (Current goals can be found in the Care Plan section)  ?Acute Rehab PT Goals ?Patient Stated Goal: return home ?PT Goal Formulation: With patient/family ?Time For Goal Achievement:  04/12/22 ?Potential to Achieve Goals: Good ? ?  ?Frequency 7X/week ?  ? ? ?Co-evaluation   ?  ?  ?  ?  ? ? ?  ?AM-PAC PT "6 Clicks" Mobility  ?Outcome Measure Help needed turning from your back to your side while in a flat bed without using bedrails?: A Little ?Help needed moving from lying on your back to sitting on the side of a flat bed without using bedrails?: A Lot ?Help needed moving to and from a bed to a chair (including a wheelchair)?: A Lot ?Help needed standing up from a chair using your arms (e.g., wheelchair or bedside chair)?: A Lot ?Help needed to walk in hospital room?: A Little ?Help needed climbing 3-5 steps with a railing? : A Lot ?6 Click Score: 14 ? ?  ?End of Session Equipment Utilized During Treatment: Gait belt ?Activity Tolerance: Patient tolerated treatment well ?Patient left: with chair alarm set;in chair;with call bell/phone within reach ?Nurse Communication: Mobility status ?PT Visit Diagnosis: Other abnormalities of gait and mobility (R26.89);Muscle weakness (generalized) (M62.81);Pain ?Pain - Right/Left: Right ?Pain - part of body: Hip ?  ? ?Time: [id number] ?PT Time Calculation (min) (ACUTE ONLY): 23 min ? ? ?Charges:   PT Evaluation ?$PT Eval  Low Complexity: 1 Low ?PT Treatments ?$Gait Training: 8-22 mins ?  ?   ? ? ?Abran Richard, PT ?Acute Rehab Services ?Pager [phone] ?[facility] [phone] ? ? ?Lindsay [name] ?03/29/2022, 12:14 PM ? ?

## 2022-03-29 NOTE — TOC Transition Note (Addendum)
Transition of Care (TOC) - CM/SW Discharge Note ? ? ?Patient Details  ?Name: Lindsay [name] ?MRN: [id number] ?Date of Birth: 11-06-1961 ? ?Transition of Care (TOC) CM/SW Contact:  ?Darran Gabay, LCSW ?Phone Number: ?03/29/2022, 10:27 AM ? ? ?Clinical Narrative:    ? ?Met with pt and son this morning and confirming she has all needed DME at home.  Plan for OPPT at [place] again.  No TOC needs. ? ?Final next level of care: OP Rehab ?Barriers to Discharge: No Barriers Identified ? ? ?Patient Goals and CMS Choice ?Patient states their goals for this hospitalization and ongoing recovery are:: return home ?  ?  ? ?Discharge Placement ?  ?           ?  ?  ?  ?  ? ?Discharge Plan and Services ?  ?  ?           ?DME Arranged: N/A ?DME Agency: NA ?  ?  ?  ?  ?  ?  ?  ?  ? ?Social Determinants of Health (SDOH) Interventions ?  ? ? ?Readmission Risk Interventions ?   ? View : No data to display.  ?  ?  ?  ? ? ? ? ? ?

## 2022-03-29 NOTE — Progress Notes (Signed)
Physical Therapy Treatment ?Patient Details ?Name: Lindsay [name] ?MRN: [id number] ?DOB: 09/16/1961 ?Today's Date: 03/29/2022 ? ? ?History of Present Illness Pt is 61 yo female s/p R anterior THA on 03/28/22.  Pt reports significant pain, popping, and detoriation in joint.  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 ? ?  ?PT Comments  ? ? Pt making gradual progress but still limited by pain with antalgic gait and heavy use of RW.  She is very motivated and should progress well.   ?Recommendations for follow up therapy are one component of a multi-disciplinary discharge planning process, led by the attending physician.  Recommendations may be updated based on patient status, additional functional criteria and insurance authorization. ? ?Follow Up Recommendations ? Follow physician's recommendations for discharge plan and follow up therapies ?  ?  ?Assistance Recommended at Discharge Frequent or constant Supervision/Assistance  ?Patient can return home with the following Help with stairs or ramp for entrance;A little help with walking and/or transfers;A little help with bathing/dressing/bathroom ?  ?Equipment Recommendations ? None recommended by PT  ?  ?Recommendations for Other Services   ? ? ?  ?Precautions / Restrictions Precautions ?Precautions: Fall ?Restrictions ?Weight Bearing Restrictions: Yes ?RLE Weight Bearing: Weight bearing as tolerated  ?  ? ?Mobility ? Bed Mobility ?Overal bed mobility: Needs Assistance ?Bed Mobility: Sit to Supine ?  ?  ?  ?Sit to supine: Min assist ?  ?General bed mobility comments: in chair at arrival; min A for R LE back to bed ?  ? ?Transfers ?Overall transfer level: Needs assistance ?Equipment used: Rolling walker (2 wheels) ?Transfers: Sit to/from Stand ?Sit to Stand: Min assist ?  ?  ?  ?  ?  ?General transfer comment: Cues for hand placement and R LE management ?  ? ?Ambulation/Gait ?Ambulation/Gait assistance: Min assist ?Gait Distance  (Feet): 40 Feet ?Assistive device: Rolling walker (2 wheels) ?Gait Pattern/deviations: Step-to pattern, Decreased stride length, Decreased weight shift to right, Trunk flexed, Shuffle, Antalgic ?Gait velocity: decreased ?  ?  ?General Gait Details: Pt wearing bedroom shoes and sliding feet.  She relies heavily on RW for decreasing weight on R LE and tends to drag R leg behind.  Very antalgic.  Pt started with more of a hopping pattern and not activating R hip flexors at all, cued to try to perform more stepping and was able to bring R leg up to left ? ? ?Stairs ?  ?  ?  ?  ?  ? ? ?Wheelchair Mobility ?  ? ?Modified Rankin (Stroke Patients Only) ?  ? ? ?  ?Balance Overall balance assessment: Needs assistance ?Sitting-balance support: No upper extremity supported ?Sitting balance-Leahy Scale: Good ?  ?  ?Standing balance support: Bilateral upper extremity supported, Reliant on assistive device for balance ?Standing balance-Leahy Scale: Poor ?Standing balance comment: requiring RW ?  ?  ?  ?  ?  ?  ?  ?  ?  ?  ?  ?  ? ?  ?Cognition Arousal/Alertness: Awake/alert ?Behavior During Therapy: [facility] for tasks assessed/performed ?Overall Cognitive Status: Within Functional Limits for tasks assessed ?  ?  ?  ?  ?  ?  ?  ?  ?  ?  ?  ?  ?  ?  ?  ?  ?  ?  ?  ? ?  ?Exercises Total Joint Exercises ?Ankle Circles/Pumps: AROM, Both, 10 reps, Seated ?Quad Sets: AROM, Both, 10 reps,  Seated ? ?  ?General Comments   ?  ?  ? ?Pertinent Vitals/Pain Pain Assessment ?Pain Assessment: 0-10 ?Pain Score: 6  ?Pain Location: R hip ?Pain Descriptors / Indicators: Discomfort, Sore ?Pain Intervention(s): Limited activity within patient's tolerance, Monitored during session, Premedicated before session, Ice applied  ? ? ?Home Living Family/patient expects to be discharged to:: Private residence ?Living Arrangements: Alone ?Available Help at Discharge: Friend(s);Available 24 hours/day (has arranged for someone to stay with her) ?Type of Home:   (condo) ?Home Access: Stairs to enter ?Entrance Stairs-Rails: None ?Entrance Stairs-Number of Steps: curb ?  ?Home Layout: One level ?Home Equipment: Agricultural consultant (2 wheels);Toilet riser ?   ?  ?Prior Function    ?  ?  ?   ? ?PT Goals (current goals can now be found in the care plan section) Acute Rehab PT Goals ?Patient Stated Goal: return home ?PT Goal Formulation: With patient/family ?Time For Goal Achievement: 04/12/22 ?Potential to Achieve Goals: Good ?Progress towards PT goals: Progressing toward goals ? ?  ?Frequency ? ? ? 7X/week ? ? ? ?  ?PT Plan Current plan remains appropriate  ? ? ?Co-evaluation   ?  ?  ?  ?  ? ?  ?AM-PAC PT "6 Clicks" Mobility   ?Outcome Measure ? Help needed turning from your back to your side while in a flat bed without using bedrails?: A Little ?Help needed moving from lying on your back to sitting on the side of a flat bed without using bedrails?: A Little ?Help needed moving to and from a bed to a chair (including a wheelchair)?: A Little ?Help needed standing up from a chair using your arms (e.g., wheelchair or bedside chair)?: A Little ?Help needed to walk in hospital room?: A Little ?Help needed climbing 3-5 steps with a railing? : A Lot ?6 Click Score: 17 ? ?  ?End of Session Equipment Utilized During Treatment: Gait belt ?Activity Tolerance: Patient tolerated treatment well ?Patient left: with call bell/phone within reach;in bed;with bed alarm set ?Nurse Communication: Mobility status ?PT Visit Diagnosis: Other abnormalities of gait and mobility (R26.89);Muscle weakness (generalized) (M62.81);Pain ?Pain - Right/Left: Right ?Pain - part of body: Hip ?  ? ? ?Time: [id number] ?PT Time Calculation (min) (ACUTE ONLY): 15 min ? ?Charges:  $Gait Training: 8-22 mins          ?          ? ?Anise Salvo, PT ?Acute Rehab Services ?Pager [phone] ?[facility] [phone] ? ? ? ?Billey Chang Eilam [place] ?03/29/2022, 3:11 PM ? ?

## 2022-03-29 NOTE — Plan of Care (Signed)
?  Problem: Education: ?Goal: Knowledge of the prescribed therapeutic regimen will improve ?03/29/2022 0000 by Virgilio Belling, RN ?Outcome: Progressing ?03/29/2022 0000 by Virgilio Belling, RN ?Outcome: Progressing ?  ?Problem: Activity: ?Goal: Ability to tolerate increased activity will improve ?Outcome: Progressing ?  ?Problem: Pain Management: ?Goal: Pain level will decrease with appropriate interventions ?03/29/2022 0000 by Virgilio Belling, RN ?Outcome: Progressing ?03/29/2022 0000 by Virgilio Belling, RN ?Outcome: Progressing ?  ?Problem: Skin Integrity: ?Goal: Will show signs of wound healing ?Outcome: Progressing ?  ?Problem: Education: ?Goal: Knowledge of General Education information will improve ?Description: Including pain rating scale, medication(s)/side effects and non-pharmacologic comfort measures ?Outcome: Progressing ?  ?Problem: Activity: ?Goal: Risk for activity intolerance will decrease ?Outcome: Progressing ?  ?Problem: Safety: ?Goal: Ability to remain free from injury will improve ?Outcome: Progressing ?  ?

## 2022-03-29 NOTE — Anesthesia Postprocedure Evaluation (Signed)
Anesthesia Post Note ? ?Patient: Lindsay [name] ? ?Procedure(s) Performed: RIGH TOTAL HIP ARTHROPLASTY ANTERIOR APPROACH (Right: Hip) ? ?  ? ?Patient location during evaluation: PACU ?Anesthesia Type: Spinal and MAC ?Level of consciousness: awake and alert ?Pain management: pain level controlled ?Vital Signs Assessment: post-procedure vital signs reviewed and stable ?Respiratory status: spontaneous breathing, nonlabored ventilation, respiratory function stable and patient connected to nasal cannula oxygen ?Cardiovascular status: stable and blood pressure returned to baseline ?Postop Assessment: no apparent nausea or vomiting ?Anesthetic complications: no ? ? ?No notable events documented. ? ?Last Vitals:  ?Vitals:  ? 03/29/22 0116 03/29/22 0450  ?BP: (!) 107/57 112/60  ?Pulse: 65 67  ?Resp: 16 16  ?Temp: 36.5 ?C 36.5 ?C  ?SpO2: 100% 100%  ?  ?Last Pain:  ?Vitals:  ? 03/29/22 0447  ?TempSrc:   ?PainSc: 4   ? ? ?  ?  ?  ?  ?  ?  ? ?Lindsay [name] ? ? ? ? ?

## 2022-03-29 NOTE — Progress Notes (Signed)
Subjective: ?1 Day Post-Op Procedure(s) (LRB): ?RIGH TOTAL HIP ARTHROPLASTY ANTERIOR APPROACH (Right) ? ?Patient is doing well this morning. Her Hgb is at 8.7 but she feels well.  ? ?Activity level:  wbat ?Diet tolerance:  ok ?Voiding:  ok ?Patient reports pain as mild.   ? ?Objective: ?Vital signs in last 24 hours: ?Temp:  [96.1 ?F (35.6 ?C)-98.4 ?F (36.9 ?C)] 97.7 ?F (36.5 ?C) (04/19 0450) ?Pulse Rate:  [47-77] 67 (04/19 0450) ?Resp:  [10-16] 16 (04/19 0450) ?BP: (90-143)/(47-76) 112/60 (04/19 0450) ?SpO2:  [93 %-100 %] 100 % (04/19 0450) ?Weight:  [96.6 kg] 96.6 kg (04/18 2111) ? ?Labs: ?Recent Labs  ?  03/29/22 ?0337  ?HGB 8.7*  ? ?Recent Labs  ?  03/29/22 ?0337  ?WBC 6.3  ?RBC 3.81*  ?HCT 29.2*  ?PLT 290  ? ?Recent Labs  ?  03/29/22 ?0337  ?NA 140  ?K 3.5  ?CL 108  ?CO2 27  ?BUN 8  ?CREATININE 0.56  ?GLUCOSE 121*  ?CALCIUM 8.6*  ? ?No results for input(s): LABPT, INR in the last 72 hours. ? ?Physical Exam: ? Neurologically intact ?ABD soft ?Neurovascular intact ?Sensation intact distally ?Intact pulses distally ?Dorsiflexion/Plantar flexion intact ?Incision: dressing C/D/I and no drainage ?No cellulitis present ?Compartment soft ? ?Assessment/Plan: ? ?1 Day Post-Op Procedure(s) (LRB): ?RIGH TOTAL HIP ARTHROPLASTY ANTERIOR APPROACH (Right) ?Advance diet ?Up with therapy ?D/C IV fluids ?Plan for discharge tomorrow ?Continue on 81 mg asa BID for dvt prevention. ?Follow up in office 2 weeks post op. ? ? ?Lindsay [name] ?03/29/2022, 7:53 AM ? ?

## 2022-03-29 NOTE — Plan of Care (Signed)
?  Problem: Activity: Goal: Ability to tolerate increased activity will improve Outcome: Progressing   Problem: Pain Management: Goal: Pain level will decrease with appropriate interventions Outcome: Progressing   Problem: Skin Integrity: Goal: Will show signs of wound healing Outcome: Progressing   Problem: Safety: Goal: Ability to remain free from injury will improve Outcome: Progressing   

## 2022-03-30 DIAGNOSIS — M1611 Unilateral primary osteoarthritis, right hip: Secondary | ICD-10-CM | POA: Diagnosis not present

## 2022-03-30 LAB — GLUCOSE, CAPILLARY
Glucose-Capillary: 122 mg/dL — ABNORMAL HIGH (ref 70–99)
Glucose-Capillary: 106 mg/dL — ABNORMAL HIGH (ref 70–99)

## 2022-03-30 LAB — CBC
HCT: 28.4 % — ABNORMAL LOW (ref 36.0–46.0)
Hemoglobin: 8.4 g/dL — ABNORMAL LOW (ref 12.0–15.0)
MCH: 22.5 pg — ABNORMAL LOW (ref 26.0–34.0)
MCHC: 29.6 g/dL — ABNORMAL LOW (ref 30.0–36.0)
MCV: 75.9 fL — ABNORMAL LOW (ref 80.0–100.0)
Platelets: 290 10*3/uL (ref 150–400)
RBC: 3.74 MIL/uL — ABNORMAL LOW (ref 3.87–5.11)
RDW: 17.2 % — ABNORMAL HIGH (ref 11.5–15.5)
WBC: 5.1 10*3/uL (ref 4.0–10.5)
nRBC: 0 % (ref 0.0–0.2)

## 2022-03-30 MED ORDER — TIZANIDINE HCL 4 MG PO TABS: 4.0000 mg | ORAL_TABLET | Freq: Four times a day (QID) | ORAL | 1 refills | Status: AC | PRN

## 2022-03-30 MED ORDER — ASPIRIN EC 81 MG PO TBEC: 81.0000 mg | DELAYED_RELEASE_TABLET | Freq: Two times a day (BID) | ORAL | 0 refills | Status: DC

## 2022-03-30 MED ORDER — OXYCODONE HCL 5 MG PO TABS
5.0000 mg | ORAL_TABLET | Freq: Four times a day (QID) | ORAL | 0 refills | Status: AC | PRN
Start: 2022-03-30 — End: 2023-03-30

## 2022-03-30 NOTE — Plan of Care (Signed)
Pt ready to DC home with family. 

## 2022-03-30 NOTE — Discharge Summary (Signed)
Patient ID: ?Lindsay [name] ?MRN: [id number] ?DOB/AGE: [birth year] 61 y.o. ? ?Admit date: 03/28/2022 ?Discharge date: 03/30/2022 ? ?Admission Diagnoses:  ?Principal Problem: ?  Primary localized osteoarthritis of right hip ?Active Problems: ?  Primary osteoarthritis of right hip ? ? ?Discharge Diagnoses:  ?Same ? ?Past Medical History:  ?Diagnosis Date  ? Allergic rhinitis, cause unspecified   ? seasonal  ? Anginal pain (HCC) 2013  ? Stress  ? Arthritis   ? hips, thumb,knees  ? Diabetes mellitus without complication (HCC)   ? Essential hypertension, benign 12/12/2011  ? Joint pain   ? Multiple drug allergies   ? Obesity   ? Wears glasses   ? Wears partial dentures   ? upper  ? ? ?Surgeries: Procedure(s): ?RIGH TOTAL HIP ARTHROPLASTY ANTERIOR APPROACH on 03/28/2022 ?  ?Consultants:  ? ?Discharged Condition: Improved ? ?Hospital Course: Lindsay [name] is an 61 y.o. female who was admitted 03/28/2022 for operative treatment ofPrimary localized osteoarthritis of right hip. Patient has severe unremitting pain that affects sleep, daily activities, and work/hobbies. After pre-op clearance the patient was taken to the operating room on 03/28/2022 and underwent  Procedure(s): ?RIGH TOTAL HIP ARTHROPLASTY ANTERIOR APPROACH.   ? ?Patient was given perioperative antibiotics:  ?Anti-infectives (From admission, onward)  ? ? Start     Dose/Rate Route Frequency Ordered Stop  ? 03/29/22 0100  vancomycin (VANCOCIN) IVPB 1000 mg/200 mL premix       ? 1,000 mg ?200 mL/hr over 60 Minutes Intravenous Every 12 hours 03/28/22 1753 03/29/22 0114  ? 03/28/22 1015  vancomycin (VANCOCIN) IVPB 1000 mg/200 mL premix       ? 1,000 mg ?200 mL/hr over 60 Minutes Intravenous On call to O.R. 03/28/22 1012 03/28/22 1253  ? ?  ?  ? ?Patient was given sequential compression devices, early ambulation, and chemoprophylaxis to prevent DVT. ? ?Patient benefited maximally from hospital stay and there were no complications.   ? ?Recent vital signs: Patient Vitals for  the past 24 hrs: ? BP Temp Temp src Pulse Resp SpO2  ?03/30/22 0639 (!) 92/55 98.9 ?F (37.2 ?C) Oral 91 18 100 %  ?03/29/22 2154 (!) 106/55 98.4 ?F (36.9 ?C) Oral 76 18 99 %  ?03/29/22 1409 (!) 104/59 98.7 ?F (37.1 ?C) -- 65 18 100 %  ?03/29/22 1008 (!) 128/57 99 ?F (37.2 ?C) -- 72 18 94 %  ?  ? ?Recent laboratory studies:  ?Recent Labs  ?  03/29/22 ?0337 03/30/22 ?0310  ?WBC 6.3 5.1  ?HGB 8.7* 8.4*  ?HCT 29.2* 28.4*  ?PLT 290 290  ?NA 140  --   ?K 3.5  --   ?CL 108  --   ?CO2 27  --   ?BUN 8  --   ?CREATININE 0.56  --   ?GLUCOSE 121*  --   ?CALCIUM 8.6*  --   ? ? ? ?Discharge Medications:   ?Allergies as of 03/30/2022   ? ?   Reactions  ? Latex Anaphylaxis  ? Depending on how long pt is touched with latex items  ? Penicillins Anaphylaxis  ? Prednisone Swelling  ? Sulfa Antibiotics Hives  ? Ceftin [cefuroxime Axetil] Rash  ? Tolerates Keflex  ? Doxycycline Rash  ? Other Rash  ? ALL MYCINS  ? ?  ? ?  ?Medication List  ?  ? ?STOP taking these medications   ? ?HYDROcodone-acetaminophen 7.5-325 MG tablet ?Commonly known as: NORCO ?  ? ?  ? ?TAKE these medications   ? ?  aspirin EC 81 MG tablet ?Take 1 tablet (81 mg total) by mouth 2 (two) times daily after a meal. For 2 weeks for DVT prevention then resume 1 x a day. ?What changed:  ?when to take this ?additional instructions ?  ?Azelastine HCl 0.15 % Soln ?Place 1 spray into the nose in the morning and at bedtime. ?  ?famotidine 40 MG tablet ?Commonly known as: PEPCID ?Take 40 mg by mouth daily as needed for heartburn or indigestion. ?  ?furosemide 40 MG tablet ?Commonly known as: LASIX ?Take 80 mg by mouth 2 (two) times daily. ?  ?gabapentin 300 MG capsule ?Commonly known as: NEURONTIN ?Take 300-600 mg by mouth 3 (three) times daily as needed (pain/spasms). ?  ?isosorbide mononitrate 30 MG 24 hr tablet ?Commonly known as: IMDUR ?Take 30 mg by mouth at bedtime. ?  ?lidocaine 5 % ?Commonly known as: LIDODERM ?Place 1 patch onto the skin every 12 (twelve) hours. ?   ?metaxalone 800 MG tablet ?Commonly known as: SKELAXIN ?Take 800 mg by mouth in the morning and at bedtime. ?  ?multivitamin with minerals Tabs tablet ?Take 1 tablet by mouth in the morning. ?  ?nitroGLYCERIN 0.4 MG SL tablet ?Commonly known as: NITROSTAT ?Place 1 tablet (0.4 mg total) under the tongue every 5 (five) minutes as needed for chest pain. ?  ?oxyCODONE 5 MG immediate release tablet ?Commonly known as: Roxicodone ?Take 1-2 tablets (5-10 mg total) by mouth every 6 (six) hours as needed for moderate pain or severe pain (post op pain). ?  ?potassium chloride SA 20 MEQ tablet ?Commonly known as: KLOR-CON M ?Take 40 mEq by mouth in the morning. ?  ?tirzepatide 10 MG/0.5ML Pen ?Commonly known as: MOUNJARO ?Inject 10 mg into the skin every Monday. ?  ?tiZANidine 4 MG tablet ?Commonly known as: Zanaflex ?Take 1 tablet (4 mg total) by mouth every 6 (six) hours as needed for muscle spasms. ?  ?topiramate 100 MG tablet ?Commonly known as: TOPAMAX ?Take 100 mg by mouth 2 (two) times daily. ?  ? ?  ? ?  ?  ? ? ?  ?Durable Medical Equipment  ?(From admission, onward)  ?  ? ? ?  ? ?  Start     Ordered  ? 03/28/22 1754  DME Walker rolling  Once       ?Question:  Patient needs a walker to treat with the following condition  Answer:  Primary osteoarthritis of right hip  ? 03/28/22 1753  ? 03/28/22 1754  DME 3 n 1  Once       ? 03/28/22 1753  ? 03/28/22 1754  DME Bedside commode  Once       ?Question:  Patient needs a bedside commode to treat with the following condition  Answer:  Primary osteoarthritis of right hip  ? 03/28/22 1753  ? ?  ?  ? ?  ? ? ?Diagnostic Studies: DG C-Arm 1-60 Min-No Report ? ?Result Date: 03/28/2022 ?Fluoroscopy was utilized by the requesting physician.  No radiographic interpretation.  ? ?DG C-Arm 1-60 Min-No Report ? ?Result Date: 03/28/2022 ?Fluoroscopy was utilized by the requesting physician.  No radiographic interpretation.  ? ?DG HIP UNILAT WITH PELVIS 1V RIGHT ? ?Result Date:  03/28/2022 ?CLINICAL DATA:  Right total hip arthroplasty. EXAM: DG HIP (WITH OR WITHOUT PELVIS) 1V RIGHT COMPARISON:  Intraoperative views of the left hip 01/17/2022. Report only from right hip radiographs 10/13/2020. FINDINGS: C-arm fluoroscopy provided. 19 seconds of fluoroscopy time. 3.4443 mGy. 5 spot  fluoroscopic images of the lower pelvis and right hip are submitted postoperatively. Initial images demonstrate previous left total hip arthroplasty and severe right hip osteoarthritis. Subsequent images demonstrate the performance of a right total hip arthroplasty. On the final images, the new right hip hardware appears well positioned, and there is no evidence of acute fracture or dislocation on single AP views. IMPRESSION: Intraoperative C-arm imaging during right total hip arthroplasty. No demonstrated complications. Electronically Signed   By: Carey Bullocks M.D.   On: 03/28/2022 14:24   ? ?Disposition: Discharge disposition: 01-Home or Self Care ? ? ? ? ? ? ?Discharge Instructions   ? ? Call MD / Call 911   Complete by: As directed ?  ? If you experience chest pain or shortness of breath, CALL 911 and be transported to the hospital emergency room.  If you develope a fever above 101 F, pus (white drainage) or increased drainage or redness at the wound, or calf pain, call your surgeon's office.  ? Constipation Prevention   Complete by: As directed ?  ? Drink plenty of fluids.  Prune juice may be helpful.  You may use a stool softener, such as Colace (over the counter) 100 mg twice a day.  Use MiraLax (over the counter) for constipation as needed.  ? Diet - low sodium heart healthy   Complete by: As directed ?  ? Discharge instructions   Complete by: As directed ?  ? INSTRUCTIONS AFTER JOINT REPLACEMENT  ? ?Remove items at home which could result in a fall. This includes throw rugs or furniture in walking pathways ?ICE to the affected joint every three hours while awake for 30 minutes at a time, for at least the  first 3-5 days, and then as needed for pain and swelling.  Continue to use ice for pain and swelling. You may notice swelling that will progress down to the foot and ankle.  This is normal after surgery.  Elevate your leg wh

## 2022-03-30 NOTE — Progress Notes (Signed)
Subjective: ?2 Days Post-Op Procedure(s) (LRB): ?RIGH TOTAL HIP ARTHROPLASTY ANTERIOR APPROACH (Right) ? ?Patient doing well. Hip pain better than before surgery already. She is looking forward to going home today. ? ?Activity level:  wbat ?Diet tolerance:  ok ?Voiding:  ok ?Patient reports pain as mild.   ? ?Objective: ?Vital signs in last 24 hours: ?Temp:  [98.4 ?F (36.9 ?C)-99 ?F (37.2 ?C)] 98.4 ?F (36.9 ?C) (04/19 2154) ?Pulse Rate:  [65-76] 76 (04/19 2154) ?Resp:  [18] 18 (04/19 2154) ?BP: (104-128)/(55-59) 106/55 (04/19 2154) ?SpO2:  [94 %-100 %] 99 % (04/19 2154) ? ?Labs: ?Recent Labs  ?  03/29/22 ?0337 03/30/22 ?0310  ?HGB 8.7* 8.4*  ? ?Recent Labs  ?  03/29/22 ?0337 03/30/22 ?0310  ?WBC 6.3 5.1  ?RBC 3.81* 3.74*  ?HCT 29.2* 28.4*  ?PLT 290 290  ? ?Recent Labs  ?  03/29/22 ?0337  ?NA 140  ?K 3.5  ?CL 108  ?CO2 27  ?BUN 8  ?CREATININE 0.56  ?GLUCOSE 121*  ?CALCIUM 8.6*  ? ?No results for input(s): LABPT, INR in the last 72 hours. ? ?Physical Exam: ? Neurologically intact ?ABD soft ?Neurovascular intact ?Sensation intact distally ?Intact pulses distally ?Dorsiflexion/Plantar flexion intact ?Incision: dressing C/D/I and no drainage ?No cellulitis present ?Compartment soft ? ?Assessment/Plan: ? ?2 Days Post-Op Procedure(s) (LRB): ?RIGH TOTAL HIP ARTHROPLASTY ANTERIOR APPROACH (Right) ?Advance diet ?Up with therapy ?D/C IV fluids ?Discharge home with home health today if cleared and doing will with PT. ?Follow up in office 2 weeks post op. ?Continue on 81mg  asa BID for DVT prevention. ? ? Hatim Homann ?03/30/2022, 6:35 AM ? ?

## 2022-03-30 NOTE — Progress Notes (Signed)
Physical Therapy Treatment ?Patient Details ?Name: Lindsay [name] ?MRN: [id number] ?DOB: 10-Aug-1961 ?Today's Date: 03/30/2022 ? ? ?History of Present Illness Pt is 61 yo female s/p R anterior THA on 03/28/22.  Pt reports significant pain, popping, and detoriation in joint.  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 ? ?  ?PT Comments  ? ? Pt is progressing well with mobility, she tolerated increased ambulation distance of 69' with RW. Stair training completed. Pt demonstrates good understanding of HEP. She would like to review HEP one more time after lunch. After that second session, I expect she'll be ready to DC home. ?   ?Recommendations for follow up therapy are one component of a multi-disciplinary discharge planning process, led by the attending physician.  Recommendations may be updated based on patient status, additional functional criteria and insurance authorization. ? ?Follow Up Recommendations ? Follow physician's recommendations for discharge plan and follow up therapies ?  ?  ?Assistance Recommended at Discharge Frequent or constant Supervision/Assistance  ?Patient can return home with the following Help with stairs or ramp for entrance;A little help with walking and/or transfers;A little help with bathing/dressing/bathroom ?  ?Equipment Recommendations ? None recommended by PT  ?  ?Recommendations for Other Services   ? ? ?  ?Precautions / Restrictions Precautions ?Precautions: Fall ?Restrictions ?Weight Bearing Restrictions: No ?RLE Weight Bearing: Weight bearing as tolerated  ?  ? ?Mobility ? Bed Mobility ?  ?  ?  ?  ?  ?  ?  ?General bed mobility comments: up in recliner ?  ? ?Transfers ?Overall transfer level: Needs assistance ?Equipment used: Rolling walker (2 wheels) ?Transfers: Sit to/from Stand ?Sit to Stand: Supervision ?  ?  ?  ?  ?  ?General transfer comment: Cues for hand placement and R LE management ?  ? ?Ambulation/Gait ?Ambulation/Gait assistance:  Supervision ?Gait Distance (Feet): 80 Feet ?Assistive device: Rolling walker (2 wheels) ?Gait Pattern/deviations: Step-to pattern, Decreased stride length, Decreased weight shift to right, Shuffle, Antalgic ?Gait velocity: decreased ?  ?  ?General Gait Details: Pt wearing bedroom shoes and sliding feet.  She relies heavily on RW for decreasing weight on R LE, cued to try to perform more stepping and was able to bring R leg up to left clearing the floor, but with increased pain ? ? ?Stairs ?Stairs: Yes ?Stairs assistance: Min assist ?Stair Management: No rails, Forwards, With walker ?Number of Stairs: 1 ?General stair comments: VCs sequencing, min A to steady RW ? ? ?Wheelchair Mobility ?  ? ?Modified Rankin (Stroke Patients Only) ?  ? ? ?  ?Balance Overall balance assessment: Needs assistance ?Sitting-balance support: No upper extremity supported ?Sitting balance-Leahy Scale: Good ?  ?  ?Standing balance support: Bilateral upper extremity supported, Reliant on assistive device for balance ?Standing balance-Leahy Scale: Poor ?Standing balance comment: requiring RW ?  ?  ?  ?  ?  ?  ?  ?  ?  ?  ?  ?  ? ?  ?Cognition Arousal/Alertness: Awake/alert ?Behavior During Therapy: [facility] for tasks assessed/performed ?Overall Cognitive Status: Within Functional Limits for tasks assessed ?  ?  ?  ?  ?  ?  ?  ?  ?  ?  ?  ?  ?  ?  ?  ?  ?  ?  ?  ? ?  ?Exercises Total Joint Exercises ?Ankle Circles/Pumps: AROM, Both, 10 reps, Seated ?Quad Sets: AROM, Seated, Right, 5 reps ?Short Arc  Quad: AROM, Right, 5 reps, Supine ?Heel Slides: AAROM, Right, 5 reps, Supine ?Hip ABduction/ADduction: AAROM, Right, 5 reps, Supine ?Long Arc Quad: AROM, Right, 5 reps, Seated ? ?  ?General Comments   ?  ?  ? ?Pertinent Vitals/Pain Pain Assessment ?Pain Score: 8  ?Pain Location: R hip with activity ?Pain Descriptors / Indicators: Burning ?Pain Intervention(s): Limited activity within patient's tolerance, Monitored during session, Premedicated before  session, Ice applied  ? ? ?Home Living   ?  ?  ?  ?  ?  ?  ?  ?  ?  ?   ?  ?Prior Function    ?  ?  ?   ? 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 ?  ?Frequency ? ? ? 7X/week ? ? ? ?  ?PT Plan Current plan remains appropriate  ? ? ?Co-evaluation   ?  ?  ?  ?  ? ?  ?AM-PAC PT "6 Clicks" Mobility   ?Outcome Measure ? Help needed turning from your back to your side while in a flat bed without using bedrails?: A Little ?Help needed moving from lying on your back to sitting on the side of a flat bed without using bedrails?: A Little ?Help needed moving to and from a bed to a chair (including a wheelchair)?: None ?Help needed standing up from a chair using your arms (e.g., wheelchair or bedside chair)?: None ?Help needed to walk in hospital room?: None ?Help needed climbing 3-5 steps with a railing? : A Little ?6 Click Score: 21 ? ?  ?End of Session Equipment Utilized During Treatment: Gait belt ?Activity Tolerance: Patient tolerated treatment well ?Patient left: with call bell/phone within reach;in chair;with chair alarm set ?Nurse Communication: Mobility status ?PT Visit Diagnosis: Other abnormalities of gait and mobility (R26.89);Muscle weakness (generalized) (M62.81);Pain ?Pain - Right/Left: Right ?Pain - part of body: Hip ?  ? ? ?Time: [id number] ?PT Time Calculation (min) (ACUTE ONLY): 35 min ? ?Charges:  $Gait Training: 8-22 mins ?$Therapeutic Exercise: 8-22 mins          ?          ? ?Philomena Doheny PT 03/30/2022  ?Acute Rehabilitation Services ?Pager [phone] ?Office [phone] ? ? ?

## 2022-03-30 NOTE — Progress Notes (Signed)
Physical Therapy Treatment ?Patient Details ?Name: Lindsay [name] ?MRN: [id number] ?DOB: [birth year] ?Today's Date: 03/30/2022 ? ? ?History of Present Illness Pt is 61 yo female s/p R anterior THA on 03/28/22.  Pt reports significant pain, popping, and detoriation in joint.  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 ? ?  ?PT Comments  ? ? Pt is progressing well with mobility, she tolerated increased ambulation distance of 200', with much improved ability to clear floor when advancing RLE. From a PT standpoint, she is ready to DC home. ?   ?Recommendations for follow up therapy are one component of a multi-disciplinary discharge planning process, led by the attending physician.  Recommendations may be updated based on patient status, additional functional criteria and insurance authorization. ? ?Follow Up Recommendations ? Follow physician's recommendations for discharge plan and follow up therapies ?  ?  ?Assistance Recommended at Discharge Frequent or constant Supervision/Assistance  ?Patient can return home with the following Help with stairs or ramp for entrance;A little help with walking and/or transfers;A little help with bathing/dressing/bathroom ?  ?Equipment Recommendations ? None recommended by PT  ?  ?Recommendations for Other Services   ? ? ?  ?Precautions / Restrictions Precautions ?Precautions: Fall ?Restrictions ?Weight Bearing Restrictions: No ?RLE Weight Bearing: Weight bearing as tolerated  ?  ? ?Mobility ? Bed Mobility ?  ?  ?  ?  ?  ?  ?  ?General bed mobility comments: up in recliner ?  ? ?Transfers ?Overall transfer level: Needs assistance ?Equipment used: Rolling walker (2 wheels) ?Transfers: Sit to/from Stand ?Sit to Stand: Modified independent (Device/Increase time) ?  ?  ?  ?  ?  ?General transfer comment: good hand placement ?  ? ?Ambulation/Gait ?Ambulation/Gait assistance: Modified independent (Device/Increase time) ?Gait Distance (Feet): 200  Feet ?Assistive device: Rolling walker (2 wheels) ?Gait Pattern/deviations: Step-to pattern, Decreased stride length, Decreased weight shift to right, Shuffle, Antalgic ?Gait velocity: decreased ?  ?  ?General Gait Details: pt now clearing R foot when advancing RLE, much improved activity tolerance and decreased pain, no loss of balance ? ? ?Stairs ?Stairs: Yes ?Stairs assistance: Min assist ?Stair Management: No rails, Forwards, With walker ?Number of Stairs: 1 ?General stair comments: VCs sequencing, min A to steady RW ? ? ?Wheelchair Mobility ?  ? ?Modified Rankin (Stroke Patients Only) ?  ? ? ?  ?Balance Overall balance assessment: Needs assistance ?Sitting-balance support: No upper extremity supported ?Sitting balance-Leahy Scale: Good ?  ?  ?Standing balance support: Bilateral upper extremity supported, Reliant on assistive device for balance ?Standing balance-Leahy Scale: Fair ?Standing balance comment: requiring RW ?  ?  ?  ?  ?  ?  ?  ?  ?  ?  ?  ?  ? ?  ?Cognition Arousal/Alertness: Awake/alert ?Behavior During Therapy: [facility] for tasks assessed/performed ?Overall Cognitive Status: Within Functional Limits for tasks assessed ?  ?  ?  ?  ?  ?  ?  ?  ?  ?  ?  ?  ?  ?  ?  ?  ?  ?  ?  ? ?  ?Exercises  ? ?  ?General Comments   ?  ?  ? ?Pertinent Vitals/Pain Pain Assessment ?Pain Score: 4  ?Pain Location: R hip with activity ?Pain Descriptors / Indicators: Burning ?Pain Intervention(s): Limited activity within patient's tolerance, Monitored during session, Premedicated before session, Ice applied  ? ? ?Home Living   ?  ?  ?  ?  ?  ?  ?  ?  ?  ?   ?  ?  Prior Function    ?  ?  ?   ? 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 ?   ?Frequency ? ? ? 7X/week ? ? ? ?  ?PT Plan Current plan remains appropriate  ? ? ?Co-evaluation   ?  ?  ?  ?  ? ?  ?AM-PAC PT "6 Clicks" Mobility   ?Outcome Measure ? Help needed turning from your back to your side while in a flat bed without using bedrails?: A Little ?Help needed moving from lying on your back to sitting on the side of a flat bed without using bedrails?: A Little ?Help needed moving to and from a bed to a chair (including a wheelchair)?: None ?Help needed standing up from a chair using your arms (e.g., wheelchair or bedside chair)?: None ?Help needed to walk in hospital room?: None ?Help needed climbing 3-5 steps with a railing? : A Little ?6 Click Score: 21 ? ?  ?End of Session Equipment Utilized During Treatment: Gait belt ?Activity Tolerance: Patient tolerated treatment well ?Patient left: with call bell/phone within reach;in chair;with chair alarm set ?Nurse Communication: Mobility status ?PT Visit Diagnosis: Other abnormalities of gait and mobility (R26.89);Muscle weakness (generalized) (M62.81);Pain ?Pain - Right/Left: Right ?Pain - part of body: Hip ?  ? ? ?Time: 6568-1275 ?PT Time Calculation (min) (ACUTE ONLY): 32 min ? ?Charges:  $Gait Training: 8-22 mins ?$Therapeutic Exercise: 8-22 mins          ?          ? ?Tamala Ser PT 03/30/2022  ?Acute Rehabilitation Services ?Pager [phone] ?Office [phone] ? ? ?

## 2022-04-12 ENCOUNTER — Other Ambulatory Visit: Payer: Self-pay | Admitting: Internal Medicine

## 2022-04-19 NOTE — Telephone Encounter (Signed)
?*  STAT* If patient is at the pharmacy, call can be transferred to refill team. ? ? ?1. Which medications need to be refilled? (please list name of each medication and dose if known) Nitroglycerin ? ?2. Which pharmacy/location (including street and city if local pharmacy) is medication to be sent to? [facility] [address], [place] ? ?3. Do they need a 30 day or 90 day supply?  ? ?

## 2022-04-26 ENCOUNTER — Telehealth: Payer: Self-pay | Admitting: Internal Medicine

## 2022-04-26 NOTE — Telephone Encounter (Signed)
?*  STAT* If patient is at the pharmacy, call can be transferred to refill team. ? ? ?1. Which medications need to be refilled? (please list name of each medication and dose if known) nitroGLYCERIN (NITROSTAT) 0.4 MG SL tablet ? ?2. Which pharmacy/location (including street and city if local pharmacy) is medication to be sent to? [id number] [place], [place] - [address] ? ?3. Do they need a 30 day or 90 day supply? 90 ? ?Patient states the pharmacy didn't get the one we sent on 5/10 ? ?

## 2022-04-28 ENCOUNTER — Other Ambulatory Visit: Payer: Self-pay

## 2022-04-28 MED ORDER — NITROGLYCERIN 0.4 MG SL SUBL: SUBLINGUAL_TABLET | SUBLINGUAL | 3 refills | Status: DC

## 2022-08-24 ENCOUNTER — Ambulatory Visit: Payer: Self-pay | Admitting: Internal Medicine

## 2022-09-22 ENCOUNTER — Encounter: Payer: Self-pay | Admitting: Internal Medicine

## 2022-09-22 ENCOUNTER — Ambulatory Visit: Payer: 59 | Attending: Internal Medicine | Admitting: Internal Medicine

## 2022-09-22 VITALS — BP 124/72 | HR 65 | Ht 67.0 in | Wt 216.4 lb

## 2022-09-22 DIAGNOSIS — I201 Angina pectoris with documented spasm: Secondary | ICD-10-CM

## 2022-09-22 DIAGNOSIS — I10 Essential (primary) hypertension: Secondary | ICD-10-CM | POA: Diagnosis not present

## 2022-09-22 DIAGNOSIS — E7800 Pure hypercholesterolemia, unspecified: Secondary | ICD-10-CM | POA: Diagnosis not present

## 2022-09-22 DIAGNOSIS — E119 Type 2 diabetes mellitus without complications: Secondary | ICD-10-CM

## 2022-09-22 DIAGNOSIS — D649 Anemia, unspecified: Secondary | ICD-10-CM

## 2022-09-22 NOTE — Progress Notes (Signed)
Cardiology Office Note:    Date:  09/22/2022   ID:  [name], DOB 05/31/1961, MRN [id number]  PCP:  Lindsay Reichmann, DO  Cardiologist:  Lindsay Poisson, MD  Electrophysiologist:  None   Referring MD: Lindsay Reichmann, DO   Chief Complaint: f/u chest pain  History of Present Illness:    Lindsay [name] is a 61 y.o. female with a history of GERD, and chest pain with no obstructive coronary artery disease.  Diagnosis of coronary artery spasm was made based on response to intracoronary nitroglycerin in the Cath Lab.  The patient continued to have some chest pain during her hospitalization which was responsive to sublingual nitroglycerin.  We had initiated Imdur.  We had also initiated amlodipine as an additional therapy with regard to coronary spasm. No chest pain or tightness, especially since starting the lasix. Since starting lasix she has discontinued amlodipine and has optimal blood pressure control. Continues on lasix without issue, which has been most helpful. Continues on imdur at 30 mg.   She unexpectedly lost her husband due to complications of surgery in February 2022. She has not had chest pain during this time and has not required sublingual nitroglycerin.  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    Today, she says she is feeling fantastic. She recently underwent bilateral hip surgery. She mentions her desire to stop taking Aspirin due to it prohibiting her absorption of iron. We discussed class II-b recommendations in guidelines. She takes ASA for possible mild luminal narrowing noted on cath, however this was deemed to be coronary spasm and may not represent CAD.   She reports bilateral LE edema due to her recent hip surgeries but improved since prior. She has been wearing compression socks, which have been helping.  She has been losing weight recently and is continuing to do so. She has been able to focus on her health.   She went back to  work on the 1st of September, after a break due to her hip surgeries, and it has been going well.   She denies any palpitations, chest pain, or shortness of breath. No lightheadedness, headaches, syncope, orthopnea, or PND.  Past Medical History:  Diagnosis Date   Allergic rhinitis, cause unspecified    seasonal   Anginal pain (HCC) 2013   Stress   Arthritis    hips, thumb,knees   Diabetes mellitus without complication (HCC)    Essential hypertension, benign 12/12/2011   Joint pain    Multiple drug allergies    Obesity    Wears glasses    Wears partial dentures    upper    Past Surgical History:  Procedure Laterality Date   COLONOSCOPY  12/11/2006   [facility]   LEFT HEART CATH AND CORONARY ANGIOGRAPHY N/A 11/11/2018   Procedure: LEFT HEART CATH AND CORONARY ANGIOGRAPHY;  Surgeon: Lindsay Kendall, MD;  [facility];  Service: Cardiovascular;  Laterality: N/A;   left knee surgery  1990   arthroscopy   ROTATOR CUFF REPAIR  09/1990   right shoulder   tonsils removed     as a child   TOTAL HIP ARTHROPLASTY Left 01/17/2022   Procedure: LEFT TOTAL HIP ARTHROPLASTY ANTERIOR APPROACH;  Surgeon: Lindsay Corning, MD;  Location: [facility];  Service: Orthopedics;  Laterality: Left;   TOTAL HIP ARTHROPLASTY Right 03/28/2022   Procedure: RIGH TOTAL HIP ARTHROPLASTY ANTERIOR APPROACH;  Surgeon: Lindsay Corning, MD;  Location: [facility];  Service:  Orthopedics;  Laterality: Right;   TUBAL LIGATION  02/08/1989   WISDOM TOOTH EXTRACTION     age 7s    Current Medications: Current Meds  Medication Sig   Azelastine HCl 0.15 % SOLN Place 1 spray into the nose in the morning and at bedtime.   famotidine (PEPCID) 40 MG tablet Take 40 mg by mouth daily as needed for heartburn or indigestion.   furosemide (LASIX) 40 MG tablet Take 80 mg by mouth 2 (two) times daily.   gabapentin (NEURONTIN) 300 MG capsule Take 300-600 mg by mouth 3 (three) times daily as needed (pain/spasms).    isosorbide mononitrate (IMDUR) 30 MG 24 hr tablet Take 30 mg by mouth at bedtime.   lidocaine (LIDODERM) 5 % Place 1 patch onto the skin every 12 (twelve) hours.   metaxalone (SKELAXIN) 800 MG tablet Take 800 mg by mouth in the morning and at bedtime.   Multiple Vitamin (MULTIVITAMIN WITH MINERALS) TABS tablet Take 1 tablet by mouth in the morning.   nitroGLYCERIN (NITROSTAT) 0.4 MG SL tablet PLACE 1 TABLET UNDER THE TONGUE EVERY 5 MINUTES AS NEEDED FOR CHEST PAIN.   oxyCODONE (ROXICODONE) 5 MG immediate release tablet Take 1-2 tablets (5-10 mg total) by mouth every 6 (six) hours as needed for moderate pain or severe pain (post op pain).   potassium chloride SA (KLOR-CON) 20 MEQ tablet Take 40 mEq by mouth in the morning.   tirzepatide [facility]) 10 MG/0.5ML Pen Inject 10 mg into the skin every Monday.   tiZANidine (ZANAFLEX) 4 MG tablet Take 1 tablet (4 mg total) by mouth every 6 (six) hours as needed for muscle spasms.   topiramate (TOPAMAX) 100 MG tablet Take 100 mg by mouth 2 (two) times daily.   [DISCONTINUED] aspirin EC 81 MG tablet Take 1 tablet (81 mg total) by mouth 2 (two) times daily after a meal. For 2 weeks for DVT prevention then resume 1 x a day.     Allergies:   Latex, Penicillins, Prednisone, Sulfa antibiotics, Ceftin [cefuroxime axetil], Doxycycline, and Other   Social History   Socioeconomic History   Marital status: Widowed    Spouse name: Not on file   Number of children: 2   Years of education: Not on file   Highest education level: Not on file  Occupational History   Occupation: Financial controller.  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   Social Determinants of Health   Financial Resource Strain: Not on file  Food Insecurity: Not on file  Transportation Needs: Not on file  Physical Activity: Not on file  Stress: Not on file  Social Connections: Not on file     Family History: The patient's family history includes Asthma in her maternal aunt; Cancer (age of onset: 32) in her mother; Heart disease (age of onset: 30) in her  father; Hypertension in her father. There is no history of Diabetes or Stroke.  ROS:   Please see the history of present illness.    (+) Bilateral LE edema  All other systems reviewed and are negative.  EKGs/Labs/Other Studies Reviewed:    The following studies were reviewed today:  LHC 11/11/2018: Conclusions: Mild luminal narrowing of the ostial LAD and distal LCx that improves with intracoronary nitroglycerin, suggestive of coronary vasospasm.  No severe stenosis noted to account for chest pain at rest and mild troponin elevation. Normal left ventricular systolic function with mildly elevated filling pressure suggestive of diastolic dysfunction.   Recommendations: Stop nitroglycerin infusion and start isosorbide mononitrate for prevention of vasospasm. Consider evaluation for alternate causes of chest pain and mild troponin elevation, including pulmonary embolism. Secondary prevention of coronary artery disease.  Echo 11/10/2018: - Left ventricle: The cavity size was normal. Wall thickness was    increased in a pattern of mild LVH. Systolic function was normal.    The estimated ejection fraction was in the range of 55% to 60%.    Wall motion was normal; there were no regional wall motion    abnormalities. Left ventricular diastolic function parameters    were normal.   Impressions:  - Normal LV  function; mild LVH; mild TR.   EKG:  EKG is personally reviewed. 09/22/22: Normal sinus rhythm. Rate 65 bpm.  05/27/2021: EKG is not ordered today. 01/20/21: SR, PACs, rate 83 06/15/2020: NSR, rate 69  Recent Labs: 03/29/2022: BUN 8; Creatinine, Ser 0.56; Potassium 3.5; Sodium 140 03/30/2022: Hemoglobin 8.4; Platelets 290  Recent Lipid Panel    Component Value Date/Time   CHOL 183 11/10/2018 0418   TRIG 74 11/10/2018 0418   HDL 72 11/10/2018 0418   CHOLHDL 2.5 11/10/2018 0418   VLDL 15 11/10/2018 0418   LDLCALC 96 11/10/2018 0418    Physical Exam:    VS:  BP 124/72   Pulse 65   Ht 5\' 7"  (1.702 m)   Wt 216 lb 6.4 oz (98.2 kg)   LMP 03/26/2012   SpO2 99%   BMI 33.89 kg/m     Wt Readings from Last 5 Encounters:  09/22/22 216 lb 6.4 oz (98.2 kg)  03/28/22 212 lb 15.4 oz (96.6 kg)  03/15/22 213 lb (96.6 kg)  01/17/22 235 lb (106.6 kg)  01/10/22 235 lb (106.6 kg)    Constitutional: No acute distress Eyes: sclera non-icteric, normal conjunctiva and lids ENMT: normal dentition, moist mucous membranes Cardiovascular: regular rhythm, normal rate, no murmurs. S1 and S2 normal. Radial pulses normal bilaterally. No jugular venous distention.  Respiratory: clear to auscultation bilaterally GI : normal bowel sounds, soft and nontender. No distention.   MSK: extremities warm, well perfused. 1+ bilateral LE edema with compression socks.  NEURO: grossly nonfocal exam, moves all extremities. PSYCH: alert and oriented x 3, normal mood and affect.   ASSESSMENT:    1. Primary hypertension   2. Coronary vasospasm (HCC)   3. Type 2 diabetes mellitus without complication, without long-term current use of insulin (HCC)   4. Pure hypercholesterolemia   5. Anemia, unspecified type     PLAN:    Essential hypertension BP stable with imdur 30 mg daily, lasix 80 mg daily with KCl.  Anemia Coronary vasospasm (HCC)- no recent chest pain.  Can use sublingual nitro as needed. OK to hold ASA  81 mg daily as she thinks this may be inhibiting iron absorption.  Type 2 diabetes mellitus without complication, without  long-term current use of insulin (HCC)-  HbA1c 5.5%, doing extremely well.   HLD - Lab work reviewed from PCP with patient during visit, improved. Continue diet and lifestyle modification. Exercise recommendations: Goal of exercising for at least 30 minutes a day, at least 5 times per week.  Please exercise to a moderate exertion.  This means that while exercising it is difficult to speak in full sentences, however you are not so short of breath that you feel you must stop, and not so comfortable that you can carry on a full conversation.  Exertion level should be approximately a 5/10, if 10 is the most exertion you can perform.  Diet recommendations: Recommend a heart healthy diet such as the Mediterranean diet.  This diet consists of plant based foods, healthy fats, lean meats, olive oil.  It suggests limiting the intake of simple carbohydrates such as white breads, pastries, and pastas.  It also limits the amount of red meat, wine, and dairy products such as cheese that one should consume on a daily basis.   Total time of encounter: 30 minutes total time of encounter, including 20 minutes spent in face-to-face patient care on the date of this encounter. This time includes coordination of care and counseling regarding above mentioned problem list. Remainder of non-face-to-face time involved reviewing chart documents/testing relevant to the patient encounter and documentation in the medical record. I have independently reviewed documentation from referring provider.   Weston Brass, MD, [facility]  [facility]     Medication Adjustments/Labs and Tests Ordered: Current medicines are reviewed at length with the patient today.  Concerns regarding medicines are outlined above.  Orders Placed This Encounter  Procedures   EKG 12-Lead   No orders of the defined  types were placed in this encounter.  Patient Instructions  Medication Instructions:  CAN STOP ASPIRIN PER PATIENT PREFERENCE  *If you need a refill on your cardiac medications before your next appointment, please call your pharmacy*  Follow-Up: At [facility], you and your health needs are our priority.  As part of our continuing mission to provide you with exceptional heart care, we have created designated Provider Care Teams.  These Care Teams include your primary Cardiologist (physician) and Advanced Practice Providers (APPs -  Physician Assistants and Nurse Practitioners) who all work together to provide you with the care you need, when you need it.  Your next appointment:   1 year(s)  The format for your next appointment:   In Person  Provider:   Parke Poisson, MD           I,Breanna Adamick,acting as a scribe for Lindsay Poisson, MD.,have documented all relevant documentation on the behalf of Lindsay Poisson, MD,as directed by  Lindsay Poisson, MD while in the presence of Lindsay Poisson, MD.   I, Lindsay Poisson, MD, have reviewed all documentation for the visit on 09/22/2022. The documentation on today's date of service for the exam, diagnosis, procedures, and orders are all accurate and complete.

## 2022-09-22 NOTE — Patient Instructions (Signed)
Medication Instructions:  CAN STOP ASPIRIN PER PATIENT PREFERENCE  *If you need a refill on your cardiac medications before your next appointment, please call your pharmacy*  Follow-Up: At [facility], you and your health needs are our priority.  As part of our continuing mission to provide you with exceptional heart care, we have created designated Provider Care Teams.  These Care Teams include your primary Cardiologist (physician) and Advanced Practice Providers (APPs -  Physician Assistants and Nurse Practitioners) who all work together to provide you with the care you need, when you need it.  Your next appointment:   1 year(s)  The format for your next appointment:   In Person  Provider:   Elouise Munroe, MD

## 2023-04-05 ENCOUNTER — Telehealth: Payer: Self-pay | Admitting: Internal Medicine

## 2023-04-05 NOTE — Telephone Encounter (Signed)
Received a call from patient.Stated she has been having episodes of fast heart beat off and on for the past 2 months.Stated she feels faint and disoriented when she has these episodes.No fast heart beat at present.Appointment scheduled with Lindsay Levering NP 4/30 at 8:50 am.Advised to go to ED if she has any more fast heart beat.

## 2023-04-05 NOTE — Telephone Encounter (Signed)
Patient c/o Palpitations:  High priority if patient c/o lightheadedness, shortness of breath, or chest pain  How long have you had palpitations/irregular HR/ Afib? Are you having the symptoms now? Started 2 months ago, is having symptoms now  Are you currently experiencing lightheadedness, SOB or CP? Lightheadedness   Do you have a history of afib (atrial fibrillation) or irregular heart rhythm? No  Have you checked your BP or HR? (document readings if available): BP normal HR was elevated just a little   Are you experiencing any other symptoms? Dizziness & increased HR   Patient states about 2 months ago she began having rushes where she becomes dizzy and her heart rate become elevated. She reports she feels she is going to [place] out when it occurs and states it has recently became more frequent. Call transferred to triage due to the patient being lightheaded.

## 2023-04-08 NOTE — Progress Notes (Deleted)
   Cardiology Clinic Note   Date: 04/08/2023 ID: LINCY BELLES, DOB [birth year], MRN [id number]  Primary Cardiologist:  Parke Poisson, MD  Patient Profile    KEYSHIA ORWICK is a 62 y.o. female who presents to the clinic today for palpitations.  Past medical history significant for: CAD. LHC 11/11/2018 (NSTEMI): Mid luminal narrowing of ostial LAD and distal LCx that improves with IC NTG suggestive of coronary vasospasm.  No severe stenosis noted to account for chest pain at rest and mild troponin elevation.  Normal LV function with mildly elevated filling pressures suggestive of diastolic dysfunction. Echo 11/10/2018: EF 55 to 60%.  Mild LVH.  Mild TR. Hypertension. Hyperlipidemia. Lipid panel 05/11/2022: LDL 104, HDL 80, TG 54, total 194. Anemia.   History of Present Illness    ZYIA KANEKO was first seen by cardiology on 11/09/2018 for chest pain during hospitalization for NSTEMI.  She underwent LHC which was suggestive of coronary vasospasm and she was started on isosorbide.  She continues to be followed by Dr. Jacques Navy for the above outlined history.  She was last seen in the office by Dr. Jacques Navy on 09/22/2022 for routine follow-up.  She was doing well.  She was instructed to hold aspirin as she believed it was inhibiting iron absorption.  Today, patient ***  Palpitations.*** Nonobstructive CAD.  LHC December 2019 showed mid luminal narrowing of ostial LAD and distal LCx improved with IC NTG suggestive of coronary spasm.  Patient*** Continue isosorbide and as needed SL NTG.  Patient not taking aspirin secondary to inhibition of iron absorption. Hypertension. BP today *** Patient denies headaches, dizziness or vision changes. Continue isosorbide. Hyperlipidemia.  LDL June 2023 104.  Patient managing cholesterol with diet control.  Continue to follow with PCP.   ROS: All other systems reviewed and are otherwise negative except as noted in History of Present Illness.  Studies  Reviewed    ECG personally reviewed by me today: ***  No significant changes from ***  Risk Assessment/Calculations    {Does this patient have ATRIAL FIBRILLATION?[phone]} No BP recorded.  {Refresh Note OR Click here to enter BP  :1}***        Physical Exam    VS:  LMP 03/26/2012  , BMI There is no height or weight on file to calculate BMI.  GEN: Well nourished, well developed, in no acute distress. Neck: No JVD or carotid bruits. Cardiac: *** RRR. No murmurs. No rubs or gallops.   Respiratory:  Respirations regular and unlabored. Clear to auscultation without rales, wheezing or rhonchi. GI: Soft, nontender, nondistended. Extremities: Radials/DP/PT 2+ and equal bilaterally. No clubbing or cyanosis. No edema ***  Skin: Warm and dry, no rash. Neuro: Strength intact.  Assessment & Plan   ***  Disposition: ***     {Are you ordering a CV Procedure (e.g. stress test, cath, DCCV, TEE, etc)?   Press F2        :[id number]}   Signed, Etta Grandchild. Shannia Jacuinde, DNP, NP-C

## 2023-04-10 ENCOUNTER — Ambulatory Visit: Payer: 59 | Attending: Student | Admitting: Student

## 2023-04-10 NOTE — Telephone Encounter (Signed)
Patient no showed to appointment scheduled today.   Attempted to call patient to follow up, unable to reach. Left message to call back with any issues or concerns to get rescheduled.

## 2023-04-11 ENCOUNTER — Encounter: Payer: Self-pay | Admitting: Student

## 2023-05-22 ENCOUNTER — Encounter (HOSPITAL_COMMUNITY): Payer: Self-pay

## 2023-05-22 ENCOUNTER — Other Ambulatory Visit: Payer: Self-pay

## 2023-05-22 ENCOUNTER — Emergency Department (HOSPITAL_COMMUNITY): Payer: 59

## 2023-05-22 ENCOUNTER — Inpatient Hospital Stay (HOSPITAL_COMMUNITY): Payer: 59

## 2023-05-22 ENCOUNTER — Inpatient Hospital Stay (HOSPITAL_COMMUNITY)
Admission: EM | Admit: 2023-05-22 | Discharge: 2023-06-11 | DRG: 082 | Disposition: E | Payer: 59 | Attending: Internal Medicine | Admitting: Internal Medicine

## 2023-05-22 DIAGNOSIS — R579 Shock, unspecified: Secondary | ICD-10-CM | POA: Diagnosis not present

## 2023-05-22 DIAGNOSIS — Z91040 Latex allergy status: Secondary | ICD-10-CM

## 2023-05-22 DIAGNOSIS — I2510 Atherosclerotic heart disease of native coronary artery without angina pectoris: Secondary | ICD-10-CM | POA: Diagnosis present

## 2023-05-22 DIAGNOSIS — E870 Hyperosmolality and hypernatremia: Secondary | ICD-10-CM | POA: Diagnosis not present

## 2023-05-22 DIAGNOSIS — I700 Atherosclerosis of aorta: Secondary | ICD-10-CM | POA: Diagnosis present

## 2023-05-22 DIAGNOSIS — R402362 Coma scale, best motor response, obeys commands, at arrival to emergency department: Secondary | ICD-10-CM | POA: Diagnosis present

## 2023-05-22 DIAGNOSIS — R739 Hyperglycemia, unspecified: Secondary | ICD-10-CM | POA: Diagnosis not present

## 2023-05-22 DIAGNOSIS — W1830XA Fall on same level, unspecified, initial encounter: Secondary | ICD-10-CM | POA: Diagnosis present

## 2023-05-22 DIAGNOSIS — I10 Essential (primary) hypertension: Secondary | ICD-10-CM | POA: Diagnosis present

## 2023-05-22 DIAGNOSIS — Z23 Encounter for immunization: Secondary | ICD-10-CM | POA: Diagnosis not present

## 2023-05-22 DIAGNOSIS — S06349A Traumatic hemorrhage of right cerebrum with loss of consciousness of unspecified duration, initial encounter: Secondary | ICD-10-CM

## 2023-05-22 DIAGNOSIS — Z882 Allergy status to sulfonamides status: Secondary | ICD-10-CM

## 2023-05-22 DIAGNOSIS — Z87891 Personal history of nicotine dependence: Secondary | ICD-10-CM

## 2023-05-22 DIAGNOSIS — R1310 Dysphagia, unspecified: Secondary | ICD-10-CM | POA: Diagnosis not present

## 2023-05-22 DIAGNOSIS — E878 Other disorders of electrolyte and fluid balance, not elsewhere classified: Secondary | ICD-10-CM | POA: Diagnosis not present

## 2023-05-22 DIAGNOSIS — I629 Nontraumatic intracranial hemorrhage, unspecified: Secondary | ICD-10-CM | POA: Diagnosis not present

## 2023-05-22 DIAGNOSIS — Z5289 Donor of other specified organs or tissues: Secondary | ICD-10-CM | POA: Diagnosis not present

## 2023-05-22 DIAGNOSIS — R569 Unspecified convulsions: Secondary | ICD-10-CM | POA: Diagnosis not present

## 2023-05-22 DIAGNOSIS — Z7985 Long-term (current) use of injectable non-insulin antidiabetic drugs: Secondary | ICD-10-CM

## 2023-05-22 DIAGNOSIS — E669 Obesity, unspecified: Secondary | ICD-10-CM | POA: Diagnosis present

## 2023-05-22 DIAGNOSIS — I4891 Unspecified atrial fibrillation: Secondary | ICD-10-CM | POA: Diagnosis present

## 2023-05-22 DIAGNOSIS — E874 Mixed disorder of acid-base balance: Secondary | ICD-10-CM | POA: Diagnosis present

## 2023-05-22 DIAGNOSIS — S02119A Unspecified fracture of occiput, initial encounter for closed fracture: Secondary | ICD-10-CM | POA: Diagnosis present

## 2023-05-22 DIAGNOSIS — R339 Retention of urine, unspecified: Secondary | ICD-10-CM | POA: Diagnosis not present

## 2023-05-22 DIAGNOSIS — Z96643 Presence of artificial hip joint, bilateral: Secondary | ICD-10-CM | POA: Diagnosis present

## 2023-05-22 DIAGNOSIS — Z880 Allergy status to penicillin: Secondary | ICD-10-CM

## 2023-05-22 DIAGNOSIS — Z8249 Family history of ischemic heart disease and other diseases of the circulatory system: Secondary | ICD-10-CM

## 2023-05-22 DIAGNOSIS — Z881 Allergy status to other antibiotic agents status: Secondary | ICD-10-CM

## 2023-05-22 DIAGNOSIS — Z66 Do not resuscitate: Secondary | ICD-10-CM | POA: Diagnosis present

## 2023-05-22 DIAGNOSIS — G8194 Hemiplegia, unspecified affecting left nondominant side: Secondary | ICD-10-CM | POA: Diagnosis not present

## 2023-05-22 DIAGNOSIS — I517 Cardiomegaly: Secondary | ICD-10-CM | POA: Diagnosis present

## 2023-05-22 DIAGNOSIS — S066X9A Traumatic subarachnoid hemorrhage with loss of consciousness of unspecified duration, initial encounter: Secondary | ICD-10-CM | POA: Diagnosis present

## 2023-05-22 DIAGNOSIS — S0101XA Laceration without foreign body of scalp, initial encounter: Secondary | ICD-10-CM | POA: Diagnosis present

## 2023-05-22 DIAGNOSIS — S061X9A Traumatic cerebral edema with loss of consciousness of unspecified duration, initial encounter: Principal | ICD-10-CM | POA: Diagnosis present

## 2023-05-22 DIAGNOSIS — Z515 Encounter for palliative care: Secondary | ICD-10-CM

## 2023-05-22 DIAGNOSIS — E1140 Type 2 diabetes mellitus with diabetic neuropathy, unspecified: Secondary | ICD-10-CM | POA: Diagnosis present

## 2023-05-22 DIAGNOSIS — S02102A Fracture of base of skull, left side, initial encounter for closed fracture: Secondary | ICD-10-CM | POA: Diagnosis present

## 2023-05-22 DIAGNOSIS — G936 Cerebral edema: Secondary | ICD-10-CM | POA: Diagnosis not present

## 2023-05-22 DIAGNOSIS — D3502 Benign neoplasm of left adrenal gland: Secondary | ICD-10-CM | POA: Diagnosis present

## 2023-05-22 DIAGNOSIS — E876 Hypokalemia: Secondary | ICD-10-CM | POA: Diagnosis not present

## 2023-05-22 DIAGNOSIS — S06A1XA Traumatic brain compression with herniation, initial encounter: Secondary | ICD-10-CM | POA: Diagnosis present

## 2023-05-22 DIAGNOSIS — S069X1A Unspecified intracranial injury with loss of consciousness of 30 minutes or less, initial encounter: Secondary | ICD-10-CM | POA: Diagnosis not present

## 2023-05-22 DIAGNOSIS — Z8679 Personal history of other diseases of the circulatory system: Secondary | ICD-10-CM

## 2023-05-22 DIAGNOSIS — I471 Supraventricular tachycardia, unspecified: Secondary | ICD-10-CM | POA: Diagnosis present

## 2023-05-22 DIAGNOSIS — D509 Iron deficiency anemia, unspecified: Secondary | ICD-10-CM | POA: Diagnosis present

## 2023-05-22 DIAGNOSIS — R55 Syncope and collapse: Secondary | ICD-10-CM | POA: Diagnosis present

## 2023-05-22 DIAGNOSIS — E8770 Fluid overload, unspecified: Secondary | ICD-10-CM | POA: Diagnosis not present

## 2023-05-22 DIAGNOSIS — S065XAA Traumatic subdural hemorrhage with loss of consciousness status unknown, initial encounter: Secondary | ICD-10-CM | POA: Diagnosis present

## 2023-05-22 DIAGNOSIS — Z79899 Other long term (current) drug therapy: Secondary | ICD-10-CM

## 2023-05-22 DIAGNOSIS — G9382 Brain death: Secondary | ICD-10-CM | POA: Diagnosis not present

## 2023-05-22 DIAGNOSIS — I639 Cerebral infarction, unspecified: Secondary | ICD-10-CM | POA: Diagnosis not present

## 2023-05-22 DIAGNOSIS — G9341 Metabolic encephalopathy: Secondary | ICD-10-CM | POA: Diagnosis not present

## 2023-05-22 DIAGNOSIS — G8929 Other chronic pain: Secondary | ICD-10-CM | POA: Diagnosis present

## 2023-05-22 DIAGNOSIS — K449 Diaphragmatic hernia without obstruction or gangrene: Secondary | ICD-10-CM | POA: Diagnosis present

## 2023-05-22 DIAGNOSIS — R8279 Other abnormal findings on microbiological examination of urine: Principal | ICD-10-CM

## 2023-05-22 DIAGNOSIS — Z888 Allergy status to other drugs, medicaments and biological substances status: Secondary | ICD-10-CM

## 2023-05-22 DIAGNOSIS — R7303 Prediabetes: Secondary | ICD-10-CM | POA: Diagnosis not present

## 2023-05-22 DIAGNOSIS — R29731 NIHSS score 31: Secondary | ICD-10-CM | POA: Diagnosis not present

## 2023-05-22 DIAGNOSIS — R402252 Coma scale, best verbal response, oriented, at arrival to emergency department: Secondary | ICD-10-CM | POA: Diagnosis present

## 2023-05-22 DIAGNOSIS — S0990XA Unspecified injury of head, initial encounter: Secondary | ICD-10-CM

## 2023-05-22 DIAGNOSIS — D3501 Benign neoplasm of right adrenal gland: Secondary | ICD-10-CM | POA: Diagnosis present

## 2023-05-22 DIAGNOSIS — Z6830 Body mass index (BMI) 30.0-30.9, adult: Secondary | ICD-10-CM

## 2023-05-22 DIAGNOSIS — I63531 Cerebral infarction due to unspecified occlusion or stenosis of right posterior cerebral artery: Secondary | ICD-10-CM | POA: Diagnosis not present

## 2023-05-22 DIAGNOSIS — I493 Ventricular premature depolarization: Secondary | ICD-10-CM | POA: Diagnosis not present

## 2023-05-22 DIAGNOSIS — K219 Gastro-esophageal reflux disease without esophagitis: Secondary | ICD-10-CM | POA: Diagnosis present

## 2023-05-22 DIAGNOSIS — I609 Nontraumatic subarachnoid hemorrhage, unspecified: Secondary | ICD-10-CM | POA: Diagnosis not present

## 2023-05-22 DIAGNOSIS — R29810 Facial weakness: Secondary | ICD-10-CM | POA: Diagnosis not present

## 2023-05-22 DIAGNOSIS — E785 Hyperlipidemia, unspecified: Secondary | ICD-10-CM | POA: Diagnosis present

## 2023-05-22 DIAGNOSIS — R402132 Coma scale, eyes open, to sound, at arrival to emergency department: Secondary | ICD-10-CM | POA: Diagnosis present

## 2023-05-22 LAB — HIV ANTIBODY (ROUTINE TESTING W REFLEX): HIV Screen 4th Generation wRfx: NONREACTIVE

## 2023-05-22 LAB — RAPID URINE DRUG SCREEN, HOSP PERFORMED
Amphetamines: NOT DETECTED
Barbiturates: NOT DETECTED
Benzodiazepines: NOT DETECTED
Cocaine: NOT DETECTED
Opiates: NOT DETECTED
Tetrahydrocannabinol: NOT DETECTED

## 2023-05-22 LAB — BASIC METABOLIC PANEL
Anion gap: 14 (ref 5–15)
BUN: 5 mg/dL — ABNORMAL LOW (ref 8–23)
CO2: 22 mmol/L (ref 22–32)
Calcium: 8.6 mg/dL — ABNORMAL LOW (ref 8.9–10.3)
Chloride: 98 mmol/L (ref 98–111)
Creatinine, Ser: 0.66 mg/dL (ref 0.44–1.00)
GFR, Estimated: >60 mL/min (ref >60)
Glucose, Bld: 149 mg/dL — ABNORMAL HIGH (ref 70–99)
Potassium: 3.3 mmol/L — ABNORMAL LOW (ref 3.5–5.1)
Sodium: 134 mmol/L — ABNORMAL LOW (ref 135–145)

## 2023-05-22 LAB — ETHANOL: Alcohol, Ethyl (B): <10 mg/dL (ref <10)

## 2023-05-22 LAB — HEMOGLOBIN A1C
Hgb A1c MFr Bld: 5.7 % — ABNORMAL HIGH (ref 4.8–5.6)
Mean Plasma Glucose: 116.89 mg/dL

## 2023-05-22 LAB — I-STAT CHEM 8, ED
BUN: 7 mg/dL — ABNORMAL LOW (ref 8–23)
Calcium, Ion: 1.06 mmol/L — ABNORMAL LOW (ref 1.15–1.40)
Chloride: 103 mmol/L (ref 98–111)
Creatinine, Ser: 0.7 mg/dL (ref 0.44–1.00)
Glucose, Bld: 173 mg/dL — ABNORMAL HIGH (ref 70–99)
HCT: 41 % (ref 36.0–46.0)
Hemoglobin: 13.9 g/dL (ref 12.0–15.0)
Potassium: 3.6 mmol/L (ref 3.5–5.1)
Sodium: 139 mmol/L (ref 135–145)
TCO2: 27 mmol/L (ref 22–32)

## 2023-05-22 LAB — CBC
HCT: 40.5 % (ref 36.0–46.0)
Hemoglobin: 12.8 g/dL (ref 12.0–15.0)
MCH: 24.5 pg — ABNORMAL LOW (ref 26.0–34.0)
MCHC: 31.6 g/dL (ref 30.0–36.0)
MCV: 77.4 fL — ABNORMAL LOW (ref 80.0–100.0)
Platelets: 226 10*3/uL (ref 150–400)
RBC: 5.23 MIL/uL — ABNORMAL HIGH (ref 3.87–5.11)
RDW: 20.4 % — ABNORMAL HIGH (ref 11.5–15.5)
WBC: 6.1 10*3/uL (ref 4.0–10.5)
nRBC: 0 % (ref 0.0–0.2)

## 2023-05-22 LAB — COMPREHENSIVE METABOLIC PANEL
ALT: 12 U/L (ref 0–44)
AST: 20 U/L (ref 15–41)
Albumin: 3.8 g/dL (ref 3.5–5.0)
Alkaline Phosphatase: 64 U/L (ref 38–126)
Anion gap: 14 (ref 5–15)
BUN: 6 mg/dL — ABNORMAL LOW (ref 8–23)
CO2: 22 mmol/L (ref 22–32)
Calcium: 8.6 mg/dL — ABNORMAL LOW (ref 8.9–10.3)
Chloride: 102 mmol/L (ref 98–111)
Creatinine, Ser: 0.77 mg/dL (ref 0.44–1.00)
GFR, Estimated: >60 mL/min (ref >60)
Glucose, Bld: 177 mg/dL — ABNORMAL HIGH (ref 70–99)
Potassium: 3.1 mmol/L — ABNORMAL LOW (ref 3.5–5.1)
Sodium: 138 mmol/L (ref 135–145)
Total Bilirubin: 1 mg/dL (ref 0.3–1.2)
Total Protein: 6.6 g/dL (ref 6.5–8.1)

## 2023-05-22 LAB — TROPONIN I (HIGH SENSITIVITY)
Troponin I (High Sensitivity): 9 ng/L (ref <18)
Troponin I (High Sensitivity): 9 ng/L (ref <18)

## 2023-05-22 LAB — LACTIC ACID, PLASMA
Lactic Acid, Venous: 1.5 mmol/L (ref 0.5–1.9)
Lactic Acid, Venous: 2.6 mmol/L (ref 0.5–1.9)

## 2023-05-22 LAB — GLUCOSE, CAPILLARY
Glucose-Capillary: 105 mg/dL — ABNORMAL HIGH (ref 70–99)
Glucose-Capillary: 112 mg/dL — ABNORMAL HIGH (ref 70–99)

## 2023-05-22 LAB — MAGNESIUM: Magnesium: 1.5 mg/dL — ABNORMAL LOW (ref 1.7–2.4)

## 2023-05-22 LAB — AMMONIA: Ammonia: 15 umol/L (ref 9–35)

## 2023-05-22 LAB — MRSA NEXT GEN BY PCR, NASAL: MRSA by PCR Next Gen: NOT DETECTED

## 2023-05-22 MED ORDER — POTASSIUM CHLORIDE 10 MEQ/100ML IV SOLN
10.0000 meq | INTRAVENOUS | Status: AC
  Administered 2023-05-22 – 2023-05-23 (×6): 10 meq via INTRAVENOUS
  Filled 2023-05-22 (×6): qty 100

## 2023-05-22 MED ORDER — DOCUSATE SODIUM 100 MG PO CAPS: 100.0000 mg | ORAL_CAPSULE | Freq: Two times a day (BID) | ORAL | Status: DC | PRN

## 2023-05-22 MED ORDER — POTASSIUM CHLORIDE 20 MEQ PO PACK
60.0000 meq | PACK | Freq: Once | ORAL | Status: DC
  Filled 2023-05-22: qty 3

## 2023-05-22 MED ORDER — MAGNESIUM SULFATE 2 GM/50ML IV SOLN
2.0000 g | Freq: Once | INTRAVENOUS | Status: AC
  Administered 2023-05-22: 2 g via INTRAVENOUS
  Filled 2023-05-22: qty 50

## 2023-05-22 MED ORDER — ACETAMINOPHEN 10 MG/ML IV SOLN
1000.0000 mg | Freq: Four times a day (QID) | INTRAVENOUS | Status: DC | PRN
  Administered 2023-05-22: 1000 mg via INTRAVENOUS
  Filled 2023-05-22: qty 100

## 2023-05-22 MED ORDER — LEVETIRACETAM IN NACL 500 MG/100ML IV SOLN
500.0000 mg | Freq: Two times a day (BID) | INTRAVENOUS | Status: DC
  Administered 2023-05-22 – 2023-05-23 (×2): 500 mg via INTRAVENOUS
  Filled 2023-05-22 (×2): qty 100

## 2023-05-22 MED ORDER — ORAL CARE MOUTH RINSE: 15.0000 mL | OROMUCOSAL | Status: DC | PRN

## 2023-05-22 MED ORDER — CHLORHEXIDINE GLUCONATE CLOTH 2 % EX PADS
6.0000 | MEDICATED_PAD | Freq: Every day | CUTANEOUS | Status: DC
  Administered 2023-05-23 – 2023-06-07 (×18): 6 via TOPICAL

## 2023-05-22 MED ORDER — INSULIN ASPART 100 UNIT/ML IJ SOLN: 0.0000 [IU] | INTRAMUSCULAR | Status: DC

## 2023-05-22 MED ORDER — IOHEXOL 350 MG/ML SOLN
65.0000 mL | Freq: Once | INTRAVENOUS | Status: AC | PRN
  Administered 2023-05-22: 65 mL via INTRAVENOUS

## 2023-05-22 MED ORDER — POLYETHYLENE GLYCOL 3350 17 G PO PACK
17.0000 g | PACK | Freq: Every day | ORAL | Status: DC | PRN
  Filled 2023-05-22: qty 1

## 2023-05-22 MED ORDER — ACETAMINOPHEN 500 MG PO TABS
1000.0000 mg | ORAL_TABLET | Freq: Once | ORAL | Status: AC
  Administered 2023-05-22: 1000 mg via ORAL
  Filled 2023-05-22: qty 2

## 2023-05-22 MED ORDER — IOHEXOL 350 MG/ML SOLN
75.0000 mL | Freq: Once | INTRAVENOUS | Status: AC | PRN
  Administered 2023-05-22: 75 mL via INTRAVENOUS

## 2023-05-22 MED ORDER — CLEVIDIPINE BUTYRATE 0.5 MG/ML IV EMUL
0.0000 mg/h | INTRAVENOUS | Status: DC
  Filled 2023-05-22: qty 100

## 2023-05-22 MED ORDER — ACETAMINOPHEN 325 MG PO TABS
650.0000 mg | ORAL_TABLET | ORAL | Status: DC | PRN
  Administered 2023-05-23 – 2023-05-26 (×5): 650 mg via ORAL
  Filled 2023-05-22 (×7): qty 2

## 2023-05-22 MED ORDER — LIDOCAINE-EPINEPHRINE-TETRACAINE (LET) TOPICAL GEL: 3.0000 mL | Freq: Once | TOPICAL | Status: DC

## 2023-05-22 MED ORDER — TETANUS-DIPHTH-ACELL PERTUSSIS 5-2.5-18.5 LF-MCG/0.5 IM SUSY
0.5000 mL | PREFILLED_SYRINGE | Freq: Once | INTRAMUSCULAR | Status: AC
  Administered 2023-05-22: 0.5 mL via INTRAMUSCULAR
  Filled 2023-05-22: qty 0.5

## 2023-05-22 MED ORDER — ONDANSETRON HCL 4 MG/2ML IJ SOLN
4.0000 mg | Freq: Once | INTRAMUSCULAR | Status: AC
  Administered 2023-05-22: 4 mg via INTRAVENOUS
  Filled 2023-05-22: qty 2

## 2023-05-22 MED ORDER — PANTOPRAZOLE SODIUM 40 MG IV SOLR
40.0000 mg | INTRAVENOUS | Status: DC
  Administered 2023-05-22: 40 mg via INTRAVENOUS
  Filled 2023-05-22: qty 10

## 2023-05-22 NOTE — Consult Note (Signed)
Chief Complaint   Chief Complaint  Patient presents with   Fall   Head Injury    History of Present Illness  Lindsay [name] is a 62 y.o. female brought to the hospital after she apparently passed out at work, striking the back of her head.  By report, this was a ground-level fall.  Since then she has been somnolent.  She was apparently in A-fib at the scene, converted by EMS.  Currently, she is drowsy but does report some headache.  She does not complain of any changes in her vision.  She does not report any new numbness tingling or weakness of the extremities.  Patient's family is at bedside who are unaware of any significant medical problems with the exception of some kind of heart procedure for which she was on medication for about 3 months.  They are not aware of any history of lung, liver, kidney disease, or oncologic history.  No known history of stroke. They do not know of any anticoagulation or antiplatelet use.  Past Medical History   Past Medical History:  Diagnosis Date   Allergic rhinitis, cause unspecified    seasonal   Anginal pain (HCC) 2013   Stress   Arthritis    hips, thumb,knees   Diabetes mellitus without complication (HCC)    Essential hypertension, benign 12/12/2011   Joint pain    Multiple drug allergies    Obesity    Wears glasses    Wears partial dentures    upper    Past Surgical History   Past Surgical History:  Procedure Laterality Date   COLONOSCOPY  12/11/2006   [facility]   LEFT HEART CATH AND CORONARY ANGIOGRAPHY N/A 11/11/2018   Procedure: LEFT HEART CATH AND CORONARY ANGIOGRAPHY;  Surgeon: Yvonne Kendall, MD;  Location: [facility];  Service: Cardiovascular;  Laterality: N/A;   left knee surgery  1990   arthroscopy   ROTATOR CUFF REPAIR  09/1990   right shoulder   tonsils removed     as a child   TOTAL HIP ARTHROPLASTY Left 01/17/2022   Procedure: LEFT TOTAL HIP ARTHROPLASTY ANTERIOR APPROACH;  Surgeon: Marcene Corning,  MD;  Location: [facility];  Service: Orthopedics;  Laterality: Left;   TOTAL HIP ARTHROPLASTY Right 03/28/2022   Procedure: RIGH TOTAL HIP ARTHROPLASTY ANTERIOR APPROACH;  Surgeon: Marcene Corning, MD;  Location: [facility];  Service: Orthopedics;  Laterality: Right;   TUBAL LIGATION  02/08/1989   WISDOM TOOTH EXTRACTION     age 58s    Social History   Social History   Tobacco Use   Smoking status: Former    Packs/day: 0.30    Years: 20.00    Additional pack years: 0.00    Total pack years: 6.00    Types: Cigarettes    Quit date: 12/11/2016    Years since quitting: 6.4   Smokeless tobacco: Never  Vaping Use   Vaping Use: Never used  Substance Use Topics   Alcohol use: Yes    Comment: socially   Drug use: No    Medications   Prior to Admission medications   Medication Sig Start Date End Date Taking? Authorizing Provider  Azelastine HCl 0.15 % SOLN Place 1 spray into the nose in the morning and at bedtime.    [provider]  famotidine (PEPCID) 40 MG tablet Take 40 mg by mouth daily as needed for heartburn or indigestion.    [provider]  furosemide (LASIX) 40 MG tablet Take  80 mg by mouth 2 (two) times daily.    [provider]  gabapentin (NEURONTIN) 300 MG capsule Take 300-600 mg by mouth 3 (three) times daily as needed (pain/spasms). 12/09/21   [provider]  isosorbide mononitrate (IMDUR) 30 MG 24 hr tablet Take 30 mg by mouth at bedtime.    [provider]  lidocaine (LIDODERM) 5 % Place 1 patch onto the skin every 12 (twelve) hours. 10/15/21   [provider]  metaxalone (SKELAXIN) 800 MG tablet Take 800 mg by mouth in the morning and at bedtime.    [provider]  Multiple Vitamin (MULTIVITAMIN WITH MINERALS) TABS tablet Take 1 tablet by mouth in the morning.    [provider]  nitroGLYCERIN (NITROSTAT) 0.4 MG SL tablet PLACE 1 TABLET UNDER THE TONGUE EVERY 5 MINUTES AS NEEDED FOR CHEST PAIN. 04/28/22    Parke Poisson, MD  potassium chloride SA (KLOR-CON) 20 MEQ tablet Take 40 mEq by mouth in the morning.    [provider]  tirzepatide [facility]) 10 MG/0.5ML Pen Inject 10 mg into the skin every Monday.    [provider]  topiramate (TOPAMAX) 100 MG tablet Take 100 mg by mouth 2 (two) times daily. 11/07/21   [provider]    Allergies   Allergies  Allergen Reactions   Latex Anaphylaxis    Depending on how long pt is touched with latex items   Penicillins Anaphylaxis   Prednisone Swelling   Sulfa Antibiotics Hives   Ceftin [Cefuroxime Axetil] Rash    Tolerates Keflex   Doxycycline Rash   Other Rash    ALL MYCINS    Review of Systems  ROS  Neurologic Exam  Drowsy but arouses easily to voice Speech fluent, appropriate CN grossly intact Motor exam: Upper Extremities Deltoid Bicep Tricep Grip  Right 5/5 5/5 5/5 5/5  Left 5/5 5/5 5/5 5/5   Lower Extremities IP Quad PF DF EHL  Right 5/5 5/5 5/5 5/5 5/5  Left 5/5 5/5 5/5 5/5 5/5   Sensation grossly intact to LT  Imaging  [facility] personally reviewed and demonstrates a right frontal convexity contusion. There is associated SAH which does appear to also involve the right Sylvian fissure, although no basal or left Sylvian SAH is seen.  Impression  - 62 y.o. female presenting after syncopal episode of unclear etiology. Pt has non-focal neurologic exam with somewhat depressed level of consciousness and CT demonstrates traumatic pattern of right frontal hemorrhage although there is some SAH primarily in the right Sylvian fissure.  Plan  - Admit to ICU for observation - SBP goal < - Keppra 500mg  BID - Agree with CTA head/neck for possible aneurysmal SAH  Lisbeth Renshaw, MD [facility]

## 2023-05-22 NOTE — Progress Notes (Signed)
eLink Physician-Brief Progress Note Patient Name: Lindsay [name] DOB: February 10, 1961 MRN: [id number]   Date of Service  05/22/2023  HPI/Events of Note  Patient with slight increase in serum lactic acid from 1.5 to 2.6 of unclear etiology, will check an interval lactic acid to verify, patient without evidence of volume depletion, hypotension or other explanation for the lactic acid bump.  eICU Interventions  Trend lactic acid.        Thomasene Lot [name] 05/22/2023, 10:13 PM

## 2023-05-22 NOTE — ED Triage Notes (Signed)
Pt arrives via EMS from work. Pt had a syncopal episode causing her to hit the back of her head on a metal shelf. Upon ems arrival, her HR was 164. EMS administered 6mg  of adenosine, which slowed her rate and showed afib. EMS then administered 20mg  of cardizem which converted her to NSR. Pt denies any symptoms that made her Plair out, she arrives AxOx4, denies blood thinners. Pt arrives in a C-Collar. Denies hx of afib. Pt reports headache, ems states she has a large hematoma on the back of her head.

## 2023-05-22 NOTE — Progress Notes (Signed)
eLink Physician-Brief Progress Note Patient Name: Lindsay [name] DOB: 02-05-1961 MRN: [id number]   Date of Service  05/22/2023  HPI/Events of Note  Patient admitted with headache and drowsiness after a syncopal episode earlier today, she is pending a CT scan of her head but needs something PRN for her headache, she is currently NPO due to her mental status, a non-sedating analgesic is favored.  eICU Interventions  PRN iv Tylenol ordered.        Thomasene Lot Leamon Palau 05/22/2023, 8:45 PM

## 2023-05-22 NOTE — Progress Notes (Addendum)
Pt most recent lactic acid was 2.6, notified from lab. Elink notified and MD notified. Orders for lactic redraw in morning.

## 2023-05-22 NOTE — ED Provider Notes (Signed)
LACERATION REPAIR Performed by: Langston Masker Authorized by: Langston Masker Consent: Verbal consent obtained. Risks and benefits: risks, benefits and alternatives were discussed Consent given by: patient Patient identity confirmed: provided demographic data Prepped and Draped in normal sterile fashion Wound explored  Laceration Location: occipital scalp  Laceration Length: 2cm  No Foreign Bodies seen or palpated  Anesthesia: local infiltration  Local anesthetic: lidocaine 2%  Anesthetic total: 2 ml  Irrigation method: syringe Amount of cleaning: standard  Skin closure: staples  Number of sutures: 4 Technique: staples  Patient tolerance: Patient tolerated the procedure well with no immediate complications.    Elson Areas, [place] 05/22/23 1627    Mardene Sayer, MD 05/22/23 [phone]

## 2023-05-22 NOTE — Progress Notes (Signed)
eLink Physician-Brief Progress Note Patient Name: Lindsay [name] DOB: 21-Mar-1961 MRN: [id number]   Date of Service  05/22/2023  HPI/Events of Note  Follow up CT scan of the head result reviewed.  eICU Interventions  No acute intervention indicated.        Migdalia Dk 05/22/2023, 11:34 PM

## 2023-05-22 NOTE — Consult Note (Addendum)
CC:ICH  History is obtained from:Family and ER doc -Dr. Elpidio Anis.  HPI: Lindsay [name] is a 62 y.o. 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  She was brought to the ER and was found to have right frontal lobe hemorrhage with 4 mm shift with edema and some subarachnoid blood.  Neurosurgery, Dr. Conchita Paris, been consulted by ER physician.  Episode of A-fib in the ER.  Per family she is a private person and they do not know much about her medical history.  She does go to her medical appointments on a regular basis.  tpa given?: No, ICH.  ROS: A complete ROS was performed and is negative except as noted in the HPI.  Unable to obtain due to altered mental status.   Past Medical History:  Diagnosis Date   Allergic rhinitis, cause unspecified    seasonal   Anginal pain (HCC) 2013   Stress   Arthritis    hips, thumb,knees   Diabetes mellitus without complication (HCC)    Essential hypertension, benign 12/12/2011   Joint pain    Multiple drug allergies    Obesity    Wears glasses    Wears partial dentures    upper     Family History  Problem Relation Age of Onset   Heart disease Father 58       MI   Hypertension Father    Cancer Mother 31       colon   Asthma Maternal Aunt    Diabetes Neg Hx    Stroke Neg Hx      Social History:  reports that she quit smoking about 6 years ago. Her smoking use included cigarettes. She has a 6.00 pack-year smoking history. She has never used smokeless tobacco. She reports current alcohol use. She reports that she does not use drugs.   Prior to Admission medications   Medication Sig Start Date End Date Taking? Authorizing Provider  Azelastine HCl 0.15 % SOLN Place 1 spray into the nose in the morning and at bedtime.    [provider]  famotidine (PEPCID) 40 MG tablet Take 40 mg by mouth daily as needed for heartburn or indigestion.     [provider]  furosemide (LASIX) 40 MG tablet Take 80 mg by mouth 2 (two) times daily.    [provider]  gabapentin (NEURONTIN) 300 MG capsule Take 300-600 mg by mouth 3 (three) times daily as needed (pain/spasms). 12/09/21   [provider]  isosorbide mononitrate (IMDUR) 30 MG 24 hr tablet Take 30 mg by mouth at bedtime.    [provider]  lidocaine (LIDODERM) 5 % Place 1 patch onto the skin every 12 (twelve) hours. 10/15/21   [provider]  metaxalone (SKELAXIN) 800 MG tablet Take 800 mg by mouth in the morning and at bedtime.    [provider]  Multiple Vitamin (MULTIVITAMIN WITH MINERALS) TABS tablet Take 1 tablet by mouth in the morning.    [provider]  nitroGLYCERIN (NITROSTAT) 0.4 MG SL tablet PLACE 1 TABLET UNDER THE TONGUE EVERY 5 MINUTES AS NEEDED FOR CHEST PAIN. 04/28/22   Parke Poisson, MD  potassium chloride SA (KLOR-CON) 20 MEQ tablet Take 40 mEq by mouth in the morning.    [provider]  tirzepatide Greggory Keen) 10 MG/0.5ML Pen Inject 10 mg into the skin every Monday.    [provider]  topiramate (TOPAMAX) 100 MG tablet Take 100 mg by mouth 2 (two) times daily. 11/07/21   [provider]     Exam: Current vital signs: BP (!) 144/75   Pulse 78   Temp 98.3 F (36.8 C) (Oral)   Resp 20   Ht 5' 7.5" (1.715 m)   Wt 90.7 kg   LMP 03/26/2012   SpO2 96%   BMI 30.86 kg/m    Physical Exam  Constitutional: Appears well-developed and well-nourished.  Psych: Affect appropriate to situation Eyes: No scleral injection HENT: She is in a c-collar Head: Normocephalic.  Cardiovascular: Normal rate and regular rhythm.  Respiratory: Effort normal and breath sounds normal to anterior ascultation GI: Soft.  No distension. There is no tenderness.  Skin: WDI  Neuro: Mental Status: Patient is awake, drowsy and briefly arousable enough to tell me her name the correct year and  identify family numbers in the room.  She will fall back asleep.  She is able to name objects and follow commands.  Cranial Nerves: II: Visual Fields are full. Pupils are equal, round, and reactive to light.   III,IV, VI: EOMI without ptosis or diploplia.  V: Facial sensation is symmetric to temperature VII: Facial movement is symmetric.  VIII: hearing is intact to voice X: Uvula elevates symmetrically XI: Shoulder shrug is symmetric. XII: tongue is midline without atrophy or fasciculations.  Motor: Tone is normal. Bulk is normal.  Nonfocal Sensory: Sensation is symmetric to light touch  Deep Tendon Reflexes: 2+ and symmetric in the biceps and patellae.  Plantars: Toes are downgoing bilaterally.  Cerebellar: No obvious ataxia.  Unable to do finger-nose or heel-to-shin due to drowsiness.   I have reviewed labs in epic and the pertinent results are: Normal creatinine 0.7.  I have reviewed the images obtained: IMPRESSION: 1. Hemorrhage in the anterior right frontal lobe, likely contusion, with surrounding edema. Additional subdural and subarachnoid hemorrhage along the right frontal convexity, with 4 mm right-to-left midline shift. 2. The ventricles appear quite small, and the basilar cisterns appear effaced, concerning for more diffuse cerebral edema. 3. No acute fracture or traumatic listhesis in the cervical spine.  Primary Diagnosis:  Right frontal lobe intracranial hemorrhage with subdural and subarachnoid hemorrhage.  4 mm right to left shift. Edema causing brain compression   Plan: -ICU admission by CCM -Keppra 500mg  BID -EEG. Stat CTA to evaluate for aneurysm discussed with Dr. Elpidio Anis -Close monitoring may need hypertonic saline but monitor closely for now - Stability scan in 6 hours or STAT with any neurological decline - Frequent neuro checks; q57min for 1 hour, then q1hour - No antiplatelets or anticoagulants due to ICH - SCD for DVT prophylaxis,  pharmacological DVT ppx at 24 hours if ICH is stable - Blood pressure control with goal systolic 130 - 150, cleverplex and labetalol PRN - Stroke labs, HgbA1c, fasting lipid panel - MRI brain with and without contrast when stabilized to evaluate for underlying mass or stroke that may have let to hemorrhagic conversion.  - Risk factor modification - Echocardiogram - PT consult, OT consult, Speech consult. - Stroke team to follow    This patient is critically ill due to subarachnoid hemorrhage subdural hemorrhage and some intracranial hemorrhage with brain compression and edema and at significant risk of neurological worsening, death and care requires constant monitoring of vital signs, hemodynamics,respiratory and cardiac monitoring, neurological assessment, discussion with family, other specialists and medical decision making of high complexity. I spent 45 minutes of neurocritical care time  in the care of  this patient. This was time spent independent of any time provided by nurse practitioner or PA.

## 2023-05-22 NOTE — ED Provider Notes (Signed)
[place] EMERGENCY DEPARTMENT AT [facility] Provider Note   CSN: [id number] Arrival date & time: 05/22/23  1042     History  Chief Complaint  Patient presents with   Fall   Head Injury    Lindsay [name] is a 62 y.o. female.  With PMH of coronary artery vasospasm, DM2, HTN, obesity brought in by EMS after syncopal episode at work.  She had fallen backwards and hit the back of her head.  She had been noted to have a heart rate in the 160s with EMS given her 6 mg adenosine which slowed her to A-fib per EMS and then given 20 mg IV Cardizem and converted back to sinus rhythm.  Patient is groggy on exam but responsive to stimulation and voice.  She does not have much recollection of what happened today.  She is continuously requesting to go to the bathroom and urinate.  She is not a good historian on exam.   Fall  Head Injury      Home Medications Prior to Admission medications   Medication Sig Start Date End Date Taking? Authorizing Provider  Azelastine HCl 0.15 % SOLN Place 1 spray into the nose in the morning and at bedtime.    [provider]  famotidine (PEPCID) 40 MG tablet Take 40 mg by mouth daily as needed for heartburn or indigestion.    [provider]  furosemide (LASIX) 40 MG tablet Take 80 mg by mouth 2 (two) times daily.    [provider]  gabapentin (NEURONTIN) 300 MG capsule Take 300-600 mg by mouth 3 (three) times daily as needed (pain/spasms). 12/09/21   [provider]  isosorbide mononitrate (IMDUR) 30 MG 24 hr tablet Take 30 mg by mouth at bedtime.    [provider]  lidocaine (LIDODERM) 5 % Place 1 patch onto the skin every 12 (twelve) hours. 10/15/21   [provider]  metaxalone (SKELAXIN) 800 MG tablet Take 800 mg by mouth in the morning and at bedtime.    [provider]  Multiple Vitamin (MULTIVITAMIN WITH MINERALS) TABS tablet Take 1 tablet by mouth in the morning.    [provider]  nitroGLYCERIN (NITROSTAT) 0.4 MG SL tablet PLACE 1 TABLET UNDER THE TONGUE EVERY 5 MINUTES AS NEEDED FOR CHEST PAIN. 04/28/22   Parke Poisson, MD  potassium chloride SA (KLOR-CON) 20 MEQ tablet Take 40 mEq by mouth in the morning.    [provider]  tirzepatide Greggory Keen) 10 MG/0.5ML Pen Inject 10 mg into the skin every Monday.    [provider]  topiramate (TOPAMAX) 100 MG tablet Take 100 mg by mouth 2 (two) times daily. 11/07/21   [provider]      Allergies    Latex, Penicillins, Prednisone, Sulfa antibiotics, Ceftin [cefuroxime axetil], Doxycycline, and Other    Review of Systems   Review of Systems  Physical Exam Updated Vital Signs BP 130/78   Pulse 69   Temp 98.3 F (36.8 C) (Oral)   Resp (!) 24   Ht 5' 7.5" (1.715 m)   Wt 90.7 kg   LMP 03/26/2012   SpO2 100%   BMI 30.86 kg/m  Physical Exam Constitutional: Alert and oriented to person, place and year.  She is groggy and requesting to go to the bathroom multiple times following all commands Eyes: Conjunctivae are normal. ENT      Head: Normocephalic and posterior head laceration approximately 2 cm in size.  Neck: No tenderness, step-off or deformity, C-spine collar present. Cardiovascular: S1, S2,  Normal and symmetric distal pulses are present in all extremities.Warm and well perfused. Respiratory: Normal respiratory effort. Breath sounds are normal.  No chest wall tenderness or deformity.  O2 sat 96 on RA Gastrointestinal: Soft and nontender.  Musculoskeletal: Normal range of motion in all extremities.      Right lower leg: No tenderness or edema.      Left lower leg: No tenderness or edema. Neurologic: Will open eyes to voice and stimulation.  Confused on exam but will follow commands.  Will move all 4 extremities against gravity but will not hold up for long periods of time. Skin: Skin is warm, dry  Psychiatric: somnolent  ED Results / Procedures / Treatments    Labs (all labs ordered are listed, but only abnormal results are displayed) Labs Reviewed  CBC - Abnormal; Notable for the following components:      Result Value   RBC 5.23 (*)    MCV 77.4 (*)    MCH 24.5 (*)    RDW 20.4 (*)    All other components within normal limits  COMPREHENSIVE METABOLIC PANEL - Abnormal; Notable for the following components:   Potassium 3.1 (*)    Glucose, Bld 177 (*)    BUN 6 (*)    Calcium 8.6 (*)    All other components within normal limits  MAGNESIUM - Abnormal; Notable for the following components:   Magnesium 1.5 (*)    All other components within normal limits  I-STAT CHEM 8, ED - Abnormal; Notable for the following components:   BUN 7 (*)    Glucose, Bld 173 (*)    Calcium, Ion 1.06 (*)    All other components within normal limits  RAPID URINE DRUG SCREEN, HOSP PERFORMED  ETHANOL  URINALYSIS, ROUTINE W REFLEX MICROSCOPIC  HIV ANTIBODY (ROUTINE TESTING W REFLEX)  TROPONIN I (HIGH SENSITIVITY)  TROPONIN I (HIGH SENSITIVITY)    EKG EKG Interpretation  Date/Time:  Tuesday May 22 2023 10:58:35 EDT Ventricular Rate:  77 PR Interval:  186 QRS Duration: 90 QT Interval:  415 QTC Calculation: 470 R Axis:   -23 Text Interpretation: Sinus rhythm Atrial premature complexes Probable left atrial enlargement Borderline left axis deviation No significant change since last tracing Confirmed by Vivien Rossetti ([id number]) on 05/22/2023 11:08:12 AM  Radiology CT Angio Chest PE W and/or Wo Contrast  Result Date: 05/22/2023 CLINICAL DATA:  Syncope. EXAM: CT ANGIOGRAPHY CHEST WITH CONTRAST TECHNIQUE: Multidetector CT imaging of the chest was performed using the standard protocol during bolus administration of intravenous contrast. Multiplanar CT image reconstructions and MIPs were obtained to evaluate the vascular anatomy. RADIATION DOSE REDUCTION: This exam was performed according to the departmental dose-optimization program which includes automated exposure  control, adjustment of the mA and/or kV according to patient size and/or use of iterative reconstruction technique. CONTRAST:  65mL OMNIPAQUE IOHEXOL 350 MG/ML SOLN COMPARISON:  Chest x-ray earlier 05/22/2023. Prior CT angiogram chest 11/12/2018 FINDINGS: Cardiovascular: No segmental or larger pulmonary embolism identified. Heart is nonenlarged. Trace pericardial fluid. The thoracic aorta has a normal course and caliber with mild atherosclerotic calcified plaque. There is a bovine type aortic arch, a normal variant. Coronary artery calcifications are seen. Please correlate for other coronary risk factors. Mediastinum/Nodes: Large hiatal hernia identified. No specific abnormal lymph node enlargement identified in the axillary regions, hilum or mediastinum. Preserved thyroid gland. Lungs/Pleura: There is some linear opacity lung bases likely scar or atelectasis.  No consolidation, pneumothorax or effusion. Upper Abdomen: Nodular thickening of both adrenal glands. These could be related to adenomas but are indeterminate on this contrast chest CT for pulmonary embolism. Recommend dedicated workup when appropriate. Musculoskeletal: Moderate diffuse degenerative changes along the spine. Streak artifact related to the patient's arms being scanned across the chest. Review of the MIP images confirms the above findings. IMPRESSION: No pulmonary embolism identified. Large hiatal hernia. Bilateral adrenal nodules. Possibly adenomas but indeterminate on this examination. Please correlate for any known history or dedicated workup when appropriate. Coronary artery calcifications. Please correlate for other coronary risk factors. Aortic Atherosclerosis (ICD10-I70.0). Electronically Signed   By: Karen Kays M.D.   On: 05/22/2023 16:15   CT Head Wo Contrast  Result Date: 05/22/2023 CLINICAL DATA:  Trauma EXAM: CT HEAD WITHOUT CONTRAST CT CERVICAL SPINE WITHOUT CONTRAST TECHNIQUE: Multidetector CT imaging of the head and cervical  spine was performed following the standard protocol without intravenous contrast. Multiplanar CT image reconstructions of the cervical spine were also generated. RADIATION DOSE REDUCTION: This exam was performed according to the departmental dose-optimization program which includes automated exposure control, adjustment of the mA and/or kV according to patient size and/or use of iterative reconstruction technique. COMPARISON:  None Available. FINDINGS: CT HEAD FINDINGS Brain: Hemorrhage in the anterior right frontal lobe, likely contusion, which is associated with surrounding hypodensity, likely edema. Additional subdural and subarachnoid hemorrhage along the right frontal convexity. The subdural hemorrhage measures up to 4 mm. 4 mm right-to-left midline shift. No evidence of acute infarct, mass, or hydrocephalus. The ventricles appear quite small, and the basilar cisterns appear effaced, concerning for more diffuse cerebral edema Vascular: No hyperdense vessel. Skull: Negative for fracture or focal lesion. Hematoma along the right parietal scalp. Small foci of air along the left occipital scalp with likely laceration. Small left suboccipital hematoma. Sinuses/Orbits: Complete opacification of right frontal sinus and right maxillary sinus. Near complete opacification of the left maxillary sinus. Air-fluid level in the left sphenoid sinus. Opacification of the anterior right ethmoid air cells. Dysconjugate gaze. Possible right proptosis. No evidence trauma in the orbits. Other: The mastoid air cells are well aerated. CT CERVICAL SPINE FINDINGS Alignment: No traumatic listhesis. Reversal of the normal cervical lordosis, with mild anterolisthesis of C4 on C5 and retrolisthesis of C5 on C6, which appears facet mediated. Osseous fusion across the C4-C5 facets and posterior disc space. Skull base and vertebrae: No acute fracture or suspicious osseous lesion. Soft tissues and spinal canal: No prevertebral fluid or  swelling. No visible canal hematoma. Disc levels: Degenerative changes in the cervical spine. No high-grade spinal canal stenosis. Upper chest: For findings in the thorax, please see same day CT chest. IMPRESSION: 1. Hemorrhage in the anterior right frontal lobe, likely contusion, with surrounding edema. Additional subdural and subarachnoid hemorrhage along the right frontal convexity, with 4 mm right-to-left midline shift. 2. The ventricles appear quite small, and the basilar cisterns appear effaced, concerning for more diffuse cerebral edema. 3. No acute fracture or traumatic listhesis in the cervical spine. These results were called by telephone at the time of interpretation on 05/22/2023 at 3:07 pm to provider Vivien Rossetti , who verbally acknowledged these results. Electronically Signed   By: Wiliam Ke M.D.   On: 05/22/2023 15:08   CT Cervical Spine Wo Contrast  Result Date: 05/22/2023 CLINICAL DATA:  Trauma EXAM: CT HEAD WITHOUT CONTRAST CT CERVICAL SPINE WITHOUT CONTRAST TECHNIQUE: Multidetector CT imaging of the head and cervical spine was performed following the  standard protocol without intravenous contrast. Multiplanar CT image reconstructions of the cervical spine were also generated. RADIATION DOSE REDUCTION: This exam was performed according to the departmental dose-optimization program which includes automated exposure control, adjustment of the mA and/or kV according to patient size and/or use of iterative reconstruction technique. COMPARISON:  None Available. FINDINGS: CT HEAD FINDINGS Brain: Hemorrhage in the anterior right frontal lobe, likely contusion, which is associated with surrounding hypodensity, likely edema. Additional subdural and subarachnoid hemorrhage along the right frontal convexity. The subdural hemorrhage measures up to 4 mm. 4 mm right-to-left midline shift. No evidence of acute infarct, mass, or hydrocephalus. The ventricles appear quite small, and the basilar cisterns  appear effaced, concerning for more diffuse cerebral edema Vascular: No hyperdense vessel. Skull: Negative for fracture or focal lesion. Hematoma along the right parietal scalp. Small foci of air along the left occipital scalp with likely laceration. Small left suboccipital hematoma. Sinuses/Orbits: Complete opacification of right frontal sinus and right maxillary sinus. Near complete opacification of the left maxillary sinus. Air-fluid level in the left sphenoid sinus. Opacification of the anterior right ethmoid air cells. Dysconjugate gaze. Possible right proptosis. No evidence trauma in the orbits. Other: The mastoid air cells are well aerated. CT CERVICAL SPINE FINDINGS Alignment: No traumatic listhesis. Reversal of the normal cervical lordosis, with mild anterolisthesis of C4 on C5 and retrolisthesis of C5 on C6, which appears facet mediated. Osseous fusion across the C4-C5 facets and posterior disc space. Skull base and vertebrae: No acute fracture or suspicious osseous lesion. Soft tissues and spinal canal: No prevertebral fluid or swelling. No visible canal hematoma. Disc levels: Degenerative changes in the cervical spine. No high-grade spinal canal stenosis. Upper chest: For findings in the thorax, please see same day CT chest. IMPRESSION: 1. Hemorrhage in the anterior right frontal lobe, likely contusion, with surrounding edema. Additional subdural and subarachnoid hemorrhage along the right frontal convexity, with 4 mm right-to-left midline shift. 2. The ventricles appear quite small, and the basilar cisterns appear effaced, concerning for more diffuse cerebral edema. 3. No acute fracture or traumatic listhesis in the cervical spine. These results were called by telephone at the time of interpretation on 05/22/2023 at 3:07 pm to provider Vivien Rossetti , who verbally acknowledged these results. Electronically Signed   By: Wiliam Ke M.D.   On: 05/22/2023 15:08   DG Chest Portable 1 View  Result  Date: 05/22/2023 CLINICAL DATA:  syncope EXAM: PORTABLE CHEST 1 VIEW COMPARISON:  Chest radiograph 01/10/2022 FINDINGS: No pleural effusion. No pneumothorax. Prominent cardiac contours. There are prominent bilateral perihilar and interstitial opacities which can be seen in the setting of pulmonary venous congestion or atypical infection. No radiographically apparent displaced rib fractures. Visualized upper abdomen is unremarkable. IMPRESSION: Prominent bilateral perihilar and interstitial opacities which can be seen in the setting of pulmonary venous congestion or atypical infection. Electronically Signed   By: Lorenza Cambridge M.D.   On: 05/22/2023 14:05    Procedures .Critical Care  Performed by: Mardene Sayer, MD Authorized by: Mardene Sayer, MD   Critical care provider statement:    Critical care time (minutes):  50   Critical care was necessary to treat or prevent imminent or life-threatening deterioration of the following conditions:  CNS failure or compromise   Critical care was time spent personally by me on the following activities:  Development of treatment plan with patient or surrogate, discussions with consultants, evaluation of patient's response to treatment, examination of patient, ordering and review  of laboratory studies, ordering and review of radiographic studies, ordering and performing treatments and interventions, pulse oximetry, re-evaluation of patient's condition, review of old charts and obtaining history from patient or surrogate   Care discussed with: admitting provider       Medications Ordered in ED Medications  potassium chloride (KLOR-CON) packet 60 mEq (has no administration in time range)  lidocaine-EPINEPHrine-tetracaine (LET) topical gel (has no administration in time range)  clevidipine (CLEVIPREX) infusion 0.5 mg/mL (has no administration in time range)  levETIRAcetam (KEPPRA) IVPB 500 mg/100 mL premix (500 mg Intravenous New Bag/Given 05/22/23  1636)  docusate sodium (COLACE) capsule 100 mg (has no administration in time range)  polyethylene glycol (MIRALAX / GLYCOLAX) packet 17 g (has no administration in time range)  acetaminophen (TYLENOL) tablet 650 mg (has no administration in time range)  Tdap (BOOSTRIX) injection 0.5 mL (0.5 mLs Intramuscular Given 05/22/23 1246)  acetaminophen (TYLENOL) tablet 1,000 mg (1,000 mg Oral Given 05/22/23 1244)  magnesium sulfate IVPB 2 g 50 mL (0 g Intravenous Stopped 05/22/23 1628)  iohexol (OMNIPAQUE) 350 MG/ML injection 65 mL (65 mLs Intravenous Contrast Given 05/22/23 1447)  ondansetron (ZOFRAN) injection 4 mg (4 mg Intravenous Given 05/22/23 1631)    ED Course/ Medical Decision Making/ A&P Clinical Course as of 05/22/23 1640  Tue May 22, 2023  1521 S/w Dr Conchita Paris on call neurosurgery, he does think this is consistent with brain contusion and recommends blood pressure control SBP less than 150 as well as Keppra 500 twice daily.  He is concerned for the amount of edema around the wound and unclear of underlying etiology.  Needs admission to ICU.  Will reach out to medical ICU due to associated abnormal syncopal episode and syncope workup. [VB]  1529 Spoke with critical care team to come to evaluate patient for admission for frequent neurochecks with intracranial hemorrhage/contusion.  Nundkumar to see.  CTA PE study pending. [VB]  1536 Dr Ramiro Harvest neurology also saw patient recommedning EEG and CTA head/neck evaluate for aneurysm [VB]    Clinical Course User Index [VB] Mardene Sayer, MD                             Medical Decision Making CHARNETTE HJERPE is a 62 y.o. female.  With PMH of coronary artery vasospasm, DM2, HTN, obesity brought in by EMS after syncopal episode at work.  She had fallen backwards and hit the back of her head.  She had been noted to have a heart rate in the 160s with EMS given her 6 mg adenosine which slowed her to A-fib per EMS and then given 20 mg IV Cardizem and  converted back to sinus rhythm.  Patient is GCS 14 on arrival initially suspected to concussion and head injury.  She was not on any anticoagulation.  CT head and C-spine were ordered concerning for right frontal contusion.  Repeat exam with GCS 11-12 due to repeated eye closure. concerning for right frontal contusion ICH, unclear if ICH led to syncope or result from head trauma.  Head laceration repaired by PA see note.  Started on Cleviprex and Keppra twice daily as recommended by neurosurgery Dr Conchita Paris.  Also seen by neurology Dr. Ramiro Harvest recommending CTA head and neck and EEG.  Labs were concerning for mild hypomagnesemia and hypokalemia.  EKG with PACs no PE seen on CTA PE study due to episode of tachycardia and syncope.  Requested admission to ICU for continuous  neurologic monitoring and further management.  Amount and/or Complexity of Data Reviewed Labs: ordered. Radiology: ordered.  Risk OTC drugs. Prescription drug management. Decision regarding hospitalization.      Final Clinical Impression(s) / ED Diagnoses Final diagnoses:  Syncope, unspecified syncope type  Injury of head, initial encounter  Hypomagnesemia  Hypokalemia  Traumatic right-sided intracerebral hemorrhage with loss of consciousness, initial encounter [facility])    Rx / DC Orders ED Discharge Orders     None         Mardene Sayer, MD 05/22/23 1640

## 2023-05-22 NOTE — Progress Notes (Signed)
EEG complete - results pending 

## 2023-05-22 NOTE — H&P (Signed)
NAME:  Lindsay [name], MRN:  [id number], DOB:  1961/05/30, LOS: 0 ADMISSION DATE:  05/22/2023, CONSULTATION DATE:  6/11 REFERRING MD:  Dr. Elpidio Anis, CHIEF COMPLAINT:  Head bleed   History of Present Illness:  Patient is a 62 yo Female w/ pertinent PMH coronary artery vasospasm, DMT2, HTN presents to [facility] ED on 6/11 w/ head bleed.  On 6/11, patient was at work and had syncopal episode falling and hit back of head. Not on any blood thinners at home. EMS called. Found patient HR 160s and was given 6 mg adenosine then given 20 mg cardizem converting back to sinus rhythm.   Upon arrival to [facility] ED, patient hemodynamically stable and afebrile. EKG sinus rhythm. CT head showing hemorrhage in the anterior right frontal lobe, likely contusion, with surrounding edema; Additional SDH and SAH along the right frontal convexity, with 4 mm right-to-left midline shift. NSG consulted recommending SBP goal <150 and Keppra 500 bid. Recommend ICU admission for close neuro monitoring. PCCM consulted.  Pertinent ED labs: ethanol and UDS wnl, trop 9, K 3.1, mag 1.5, glucose 177 CTA chest with no PE; large hiatal hernia; b/l adrenal nodules possible adenomas.  Pertinent  Medical History   Past Medical History:  Diagnosis Date   Allergic rhinitis, cause unspecified    seasonal   Anginal pain (HCC) 2013   Stress   Arthritis    hips, thumb,knees   Diabetes mellitus without complication (HCC)    Essential hypertension, benign 12/12/2011   Joint pain    Multiple drug allergies    Obesity    Wears glasses    Wears partial dentures    upper     Significant Hospital Events: Including procedures, antibiotic start and stop dates in addition to other pertinent events   6/11 syncopal episode then hit head; ct head with head bleed and midline shift  Interim History / Subjective:  See above  Objective   Blood pressure (!) 144/75, pulse 78, temperature 98.3 F (36.8 C), temperature source Oral, resp. rate 20,  height 5' 7.5" (1.715 m), weight 90.7 kg, last menstrual period 03/26/2012, SpO2 96 %.       No intake or output data in the 24 hours ending 05/22/23 1544 Filed Weights   05/22/23 1104  Weight: 90.7 kg    Examination: General: NAD HEENT: MM pink/moist Neuro: lethargic; arousable and able to state name and place; MAE and strength symmetrical; photophobia; facial movement symmetric; PERRL  CV: s1s2, RRR, no m/r/g PULM:  dim clear BS bilaterally GI: soft, bsx4 active  Extremities: warm/dry, trace BLE edema  Skin: no rashes or lesions    Resolved Hospital Problem list     Assessment & Plan:  Syncopal episode Right Frontal Lobe Intracranial Hemorrhage w/ SDH and SAH Small posterior scalp laceration -CT head showing hemorrhage in the anterior right frontal lobe, likely contusion, with surrounding edema; Additional SDH and SAH along the right frontal convexity, with 4 mm right-to-left midline shift. Plan: -neuro and NSG following; appreciate recs -stat CTA head/neck to evaluate for aneurysm -repeat imaging per neuro -SBP goal <160; cleviprex if needed -Keppra 500 bid for seizure prophylaxis per NSG -EEG -frequent neuro checks -limit sedating meds -statin per neuro; hold on DAPT -Continue neuroprotective measures- normothermia, euglycemia, HOB greater than 30, head in neutral alignment, normocapnia, normoxia.  -4 staples placed on occipital scalp; will need to be removed in 7-10 days -PT/OT/SLP  Possible SVT: patient found to be in SVT rate 160s when EMS arrived; given  adenosine and cardizem now NSR Hypokalemia Hypomagnesemia Plan: -telemetry monitoring -k/mag repleted -trend bmp and mag  DMT2 Plan: -ssi and cbg monitoring -A1c  HTN Hx of coronary vasospasm Plan: -cleviprex for SBP goal <160  Peripheral neuropathy Plan: -hold home gabapentin  GERD Large hiatal hernia Plan: -PPI  Adrenal nodules (possible adenomas) Plan: -further f/u outpt   Best  Practice (right click and "Reselect all SmartList Selections" daily)   Diet/type: NPO w/ oral meds DVT prophylaxis: SCD GI prophylaxis: PPI Lines: N/A Foley:  N/A Code Status:  full code Last date of multidisciplinary goals of care discussion [6/11 updated daughter and son at bedside]  Labs   CBC: Recent Labs  Lab 05/22/23 1102 05/22/23 1114  WBC 6.1  --   HGB 12.8 13.9  HCT 40.5 41.0  MCV 77.4*  --   PLT 226  --     Basic Metabolic Panel: Recent Labs  Lab 05/22/23 1102 05/22/23 1114  NA 138 139  K 3.1* 3.6  CL 102 103  CO2 22  --   GLUCOSE 177* 173*  BUN 6* 7*  CREATININE 0.77 0.70  CALCIUM 8.6*  --   MG 1.5*  --    GFR: Estimated Creatinine Clearance: 84.1 mL/min (by C-G formula based on SCr of 0.7 mg/dL). Recent Labs  Lab 05/22/23 1102  WBC 6.1    Liver Function Tests: Recent Labs  Lab 05/22/23 1102  AST 20  ALT 12  ALKPHOS 64  BILITOT 1.0  PROT 6.6  ALBUMIN 3.8   No results for input(s): "LIPASE", "AMYLASE" in the last 168 hours. No results for input(s): "AMMONIA" in the last 168 hours.  ABG    Component Value Date/Time   TCO2 27 05/22/2023 1114     Coagulation Profile: No results for input(s): "INR", "PROTIME" in the last 168 hours.  Cardiac Enzymes: No results for input(s): "CKTOTAL", "CKMB", "CKMBINDEX", "TROPONINI" in the last 168 hours.  HbA1C: Hgb A1c MFr Bld  Date/Time Value Ref Range Status  03/15/2022 08:46 AM 5.5 4.8 - 5.6 % Final    Comment:    (NOTE)         Prediabetes: 5.7 - 6.4         Diabetes: >6.4         Glycemic control for adults with diabetes: <7.0   01/26/2014 10:15 AM 6.7 (H) <5.7 % Final    Comment:                                                                           According to the ADA Clinical Practice Recommendations for 2011, when HbA1c is used as a screening test:     >=6.5%   Diagnostic of Diabetes Mellitus            (if abnormal result is confirmed)   5.7-6.4%   Increased risk of  developing Diabetes Mellitus   References:Diagnosis and Classification of Diabetes Mellitus,Diabetes Care,2011,34(Suppl 1):S62-S69 and Standards of Medical Care in         Diabetes - 2011,Diabetes Care,2011,34 (Suppl 1):S11-S61.      CBG: No results for input(s): "GLUCAP" in the last 168 hours.  Review of Systems:   Patient is encephalopathic; therefore, history has  been obtained from chart review.   Past Medical History:  She,  has a past medical history of Allergic rhinitis, cause unspecified, Anginal pain (HCC) (2013), Arthritis, Diabetes mellitus without complication (HCC), Essential hypertension, benign (12/12/2011), Joint pain, Multiple drug allergies, Obesity, Wears glasses, and Wears partial dentures.   Surgical History:   Past Surgical History:  Procedure Laterality Date   COLONOSCOPY  12/11/2006   [facility]   LEFT HEART CATH AND CORONARY ANGIOGRAPHY N/A 11/11/2018   Procedure: LEFT HEART CATH AND CORONARY ANGIOGRAPHY;  Surgeon: Yvonne Kendall, MD;  Location: [facility] INVAS[facility];  Service: Cardiovascular;  Laterality: N/A;   left knee surgery  1990   arthroscopy   ROTATOR CUFF REPAIR  09/1990   right shoulder   tonsils removed     as a child   TOTAL HIP ARTHROPLASTY Left 01/17/2022   Procedure: LEFT TOTAL HIP ARTHROPLASTY ANTERIOR APPROACH;  Surgeon: Marcene Corning, MD;  Location: [facility];  Service: Orthopedics;  Laterality: Left;   TOTAL HIP ARTHROPLASTY Right 03/28/2022   Procedure: RIGH TOTAL HIP ARTHROPLASTY ANTERIOR APPROACH;  Surgeon: Marcene Corning, MD;  Location: [facility];  Service: Orthopedics;  Laterality: Right;   TUBAL LIGATION  02/08/1989   WISDOM TOOTH EXTRACTION     age 2s     Social History:   reports that she quit smoking about 6 years ago. Her smoking use included cigarettes. She has a 6.00 pack-year smoking history. She has never used smokeless tobacco. She reports current alcohol use. She reports that she does not use drugs.   Family  History:  Her family history includes Asthma in her maternal aunt; Cancer (age of onset: 65) in her mother; Heart disease (age of onset: 75) in her father; Hypertension in her father. There is no history of Diabetes or Stroke.   Allergies Allergies  Allergen Reactions   Latex Anaphylaxis    Depending on how long pt is touched with latex items   Penicillins Anaphylaxis   Prednisone Swelling   Sulfa Antibiotics Hives   Ceftin [Cefuroxime Axetil] Rash    Tolerates Keflex   Doxycycline Rash   Other Rash    ALL MYCINS     Home Medications  Prior to Admission medications   Medication Sig Start Date End Date Taking? Authorizing Provider  Azelastine HCl 0.15 % SOLN Place 1 spray into the nose in the morning and at bedtime.    [provider]  famotidine (PEPCID) 40 MG tablet Take 40 mg by mouth daily as needed for heartburn or indigestion.    [provider]  furosemide (LASIX) 40 MG tablet Take 80 mg by mouth 2 (two) times daily.    [provider]  gabapentin (NEURONTIN) 300 MG capsule Take 300-600 mg by mouth 3 (three) times daily as needed (pain/spasms). 12/09/21   [provider]  isosorbide mononitrate (IMDUR) 30 MG 24 hr tablet Take 30 mg by mouth at bedtime.    [provider]  lidocaine (LIDODERM) 5 % Place 1 patch onto the skin every 12 (twelve) hours. 10/15/21   [provider]  metaxalone (SKELAXIN) 800 MG tablet Take 800 mg by mouth in the morning and at bedtime.    [provider]  Multiple Vitamin (MULTIVITAMIN WITH MINERALS) TABS tablet Take 1 tablet by mouth in the morning.    [provider]  nitroGLYCERIN (NITROSTAT) 0.4 MG SL tablet PLACE 1 TABLET UNDER THE TONGUE EVERY 5 MINUTES AS NEEDED FOR CHEST PAIN. 04/28/22   Weston Brass  A, MD  potassium chloride SA (KLOR-CON) 20 MEQ tablet Take 40 mEq by mouth in the morning.    [provider]  tirzepatide Greggory Keen) 10 MG/0.5ML Pen Inject 10 mg  into the skin every Monday.    [provider]  topiramate (TOPAMAX) 100 MG tablet Take 100 mg by mouth 2 (two) times daily. 11/07/21   [provider]     Critical care time: 45 minutes    JD Daryel [facility] 05/22/2023, 3:44 PM  Please see [email or web address] for pager details.  From 7A-7P if no response, please call [phone]. After hours, please call ELink [phone].

## 2023-05-23 ENCOUNTER — Inpatient Hospital Stay (HOSPITAL_COMMUNITY): Payer: 59

## 2023-05-23 DIAGNOSIS — R569 Unspecified convulsions: Secondary | ICD-10-CM | POA: Diagnosis not present

## 2023-05-23 DIAGNOSIS — S06349A Traumatic hemorrhage of right cerebrum with loss of consciousness of unspecified duration, initial encounter: Secondary | ICD-10-CM | POA: Diagnosis not present

## 2023-05-23 DIAGNOSIS — R55 Syncope and collapse: Secondary | ICD-10-CM | POA: Diagnosis not present

## 2023-05-23 DIAGNOSIS — S069X1A Unspecified intracranial injury with loss of consciousness of 30 minutes or less, initial encounter: Secondary | ICD-10-CM

## 2023-05-23 DIAGNOSIS — R739 Hyperglycemia, unspecified: Secondary | ICD-10-CM | POA: Diagnosis not present

## 2023-05-23 DIAGNOSIS — I629 Nontraumatic intracranial hemorrhage, unspecified: Secondary | ICD-10-CM | POA: Diagnosis not present

## 2023-05-23 LAB — CBC
HCT: 40.6 % (ref 36.0–46.0)
Hemoglobin: 12.9 g/dL (ref 12.0–15.0)
MCH: 24.4 pg — ABNORMAL LOW (ref 26.0–34.0)
MCHC: 31.8 g/dL (ref 30.0–36.0)
MCV: 76.9 fL — ABNORMAL LOW (ref 80.0–100.0)
Platelets: 198 10*3/uL (ref 150–400)
RBC: 5.28 MIL/uL — ABNORMAL HIGH (ref 3.87–5.11)
RDW: 20.5 % — ABNORMAL HIGH (ref 11.5–15.5)
WBC: 9.3 10*3/uL (ref 4.0–10.5)
nRBC: 0 % (ref 0.0–0.2)

## 2023-05-23 LAB — URINALYSIS, ROUTINE W REFLEX MICROSCOPIC
Bacteria, UA: NONE SEEN
Bilirubin Urine: NEGATIVE
Glucose, UA: NEGATIVE mg/dL
Ketones, ur: 80 mg/dL — AB
Leukocytes,Ua: NEGATIVE
Nitrite: NEGATIVE
Protein, ur: 30 mg/dL — AB
Specific Gravity, Urine: 1.044 — ABNORMAL HIGH (ref 1.005–1.030)
pH: 6 (ref 5.0–8.0)

## 2023-05-23 LAB — LACTIC ACID, PLASMA
Lactic Acid, Venous: 2.7 mmol/L (ref 0.5–1.9)
Lactic Acid, Venous: 1.3 mmol/L (ref 0.5–1.9)

## 2023-05-23 LAB — BASIC METABOLIC PANEL
Anion gap: 19 — ABNORMAL HIGH (ref 5–15)
BUN: 5 mg/dL — ABNORMAL LOW (ref 8–23)
CO2: 20 mmol/L — ABNORMAL LOW (ref 22–32)
Calcium: 8.9 mg/dL (ref 8.9–10.3)
Chloride: 97 mmol/L — ABNORMAL LOW (ref 98–111)
Creatinine, Ser: 0.74 mg/dL (ref 0.44–1.00)
GFR, Estimated: >60 mL/min (ref >60)
Glucose, Bld: 137 mg/dL — ABNORMAL HIGH (ref 70–99)
Potassium: 3.4 mmol/L — ABNORMAL LOW (ref 3.5–5.1)
Sodium: 136 mmol/L (ref 135–145)

## 2023-05-23 LAB — ECHOCARDIOGRAM COMPLETE
AR max vel: 3.03 cm2
AV Area VTI: 3 cm2
AV Area mean vel: 3.01 cm2
AV Mean grad: 3.5 mmHg
AV Peak grad: 6.9 mmHg
Ao pk vel: 1.32 m/s
Area-P 1/2: 3.77 cm2
Calc EF: 60 %
Height: 67.5 in
MV VTI: 2.86 cm2
S' Lateral: 3.2 cm
Single Plane A2C EF: 59.8 %
Single Plane A4C EF: 58.2 %
Weight: 3199.32 oz

## 2023-05-23 LAB — LIPID PANEL
Cholesterol: 197 mg/dL (ref 0–200)
HDL: 81 mg/dL (ref >40)
LDL Cholesterol: 106 mg/dL — ABNORMAL HIGH (ref 0–99)
Total CHOL/HDL Ratio: 2.4 RATIO
Triglycerides: 50 mg/dL (ref <150)
VLDL: 10 mg/dL (ref 0–40)

## 2023-05-23 LAB — GLUCOSE, CAPILLARY
Glucose-Capillary: 118 mg/dL — ABNORMAL HIGH (ref 70–99)
Glucose-Capillary: 99 mg/dL (ref 70–99)
Glucose-Capillary: 97 mg/dL (ref 70–99)
Glucose-Capillary: 143 mg/dL — ABNORMAL HIGH (ref 70–99)
Glucose-Capillary: 160 mg/dL — ABNORMAL HIGH (ref 70–99)

## 2023-05-23 LAB — MAGNESIUM: Magnesium: 2.1 mg/dL (ref 1.7–2.4)

## 2023-05-23 MED ORDER — LEVETIRACETAM 500 MG PO TABS
500.0000 mg | ORAL_TABLET | Freq: Two times a day (BID) | ORAL | Status: DC
  Administered 2023-05-23: 500 mg via ORAL
  Filled 2023-05-23: qty 1

## 2023-05-23 MED ORDER — BUTALBITAL-APAP-CAFFEINE 50-325-40 MG PO TABS
1.0000 | ORAL_TABLET | Freq: Four times a day (QID) | ORAL | Status: DC | PRN
  Administered 2023-05-23 – 2023-05-27 (×9): 1 via ORAL
  Filled 2023-05-23 (×9): qty 1

## 2023-05-23 MED ORDER — LABETALOL HCL 5 MG/ML IV SOLN
10.0000 mg | INTRAVENOUS | Status: DC | PRN
  Administered 2023-05-24 – 2023-06-05 (×13): 10 mg via INTRAVENOUS
  Filled 2023-05-23 (×14): qty 4

## 2023-05-23 MED ORDER — SODIUM CHLORIDE 3 % IV SOLN
INTRAVENOUS | Status: DC
  Filled 2023-05-23 (×2): qty 500
  Filled 2023-05-23: qty 1000
  Filled 2023-05-23 (×7): qty 500

## 2023-05-23 MED ORDER — HYDRALAZINE HCL 20 MG/ML IJ SOLN
10.0000 mg | INTRAMUSCULAR | Status: DC | PRN
  Filled 2023-05-23: qty 1

## 2023-05-23 MED ORDER — PANTOPRAZOLE SODIUM 40 MG PO TBEC: 40.0000 mg | DELAYED_RELEASE_TABLET | Freq: Every day | ORAL | Status: DC

## 2023-05-23 MED ORDER — TAMSULOSIN HCL 0.4 MG PO CAPS
0.4000 mg | ORAL_CAPSULE | Freq: Every day | ORAL | Status: DC
  Administered 2023-05-23 – 2023-05-27 (×4): 0.4 mg via ORAL
  Filled 2023-05-23 (×4): qty 1

## 2023-05-23 MED ORDER — PANTOPRAZOLE SODIUM 40 MG IV SOLR
40.0000 mg | Freq: Every day | INTRAVENOUS | Status: DC
  Administered 2023-05-23 – 2023-05-26 (×4): 40 mg via INTRAVENOUS
  Filled 2023-05-23 (×4): qty 10

## 2023-05-23 MED ORDER — INSULIN ASPART 100 UNIT/ML IJ SOLN
0.0000 [IU] | Freq: Three times a day (TID) | INTRAMUSCULAR | Status: DC
  Administered 2023-05-26: 1 [IU] via SUBCUTANEOUS

## 2023-05-23 MED ORDER — POTASSIUM CHLORIDE CRYS ER 20 MEQ PO TBCR
40.0000 meq | EXTENDED_RELEASE_TABLET | Freq: Once | ORAL | Status: AC
  Administered 2023-05-23: 40 meq via ORAL
  Filled 2023-05-23: qty 2

## 2023-05-23 MED ORDER — METAXALONE 800 MG PO TABS
800.0000 mg | ORAL_TABLET | Freq: Two times a day (BID) | ORAL | Status: DC
  Administered 2023-05-23: 800 mg via ORAL
  Filled 2023-05-23 (×2): qty 1

## 2023-05-23 MED ORDER — LIDOCAINE 5 % EX PTCH
1.0000 | MEDICATED_PATCH | CUTANEOUS | Status: AC
  Administered 2023-05-23 – 2023-05-29 (×6): 1 via TRANSDERMAL
  Filled 2023-05-23 (×6): qty 1

## 2023-05-23 NOTE — Progress Notes (Addendum)
STROKE TEAM PROGRESS NOTE   INTERVAL HISTORY Son at bedside.  RN at bedside. Admitted 6/11 after falling at work and hitting her head.  Imaging shows occipital skull fracture, right frontal lobe hemorrhage with edema, shift and subarachnoid hemorrhage.  Neurosurgery was also consulted, No intervention.  Routine EEG negative.  Placed on Keppra 500 mg twice daily due to risk of seizures with subarachnoid. On exam today patient endorses headache.  Fioricet added as needed.  No focal weakness or confusion seen  Vitals:   05/23/23 0700 05/23/23 0800 05/23/23 1200 05/23/23 1345  BP: 124/65 (!) 142/63  98/84  Pulse: 88 62  (!) 52  Resp: (!) 30 18    Temp:  98.4 F (36.9 C) 98.1 F (36.7 C)   TempSrc:  Oral Oral   SpO2: 100% 100%  (!) 67%  Weight:      Height:       CBC:  Recent Labs  Lab 05/22/23 1102 05/22/23 1114 05/23/23 0215  WBC 6.1  --  9.3  HGB 12.8 13.9 12.9  HCT 40.5 41.0 40.6  MCV 77.4*  --  76.9*  PLT 226  --  198   Basic Metabolic Panel:  Recent Labs  Lab 05/22/23 1102 05/22/23 1114 05/22/23 1805 05/23/23 0215  NA 138   < > 134* 136  K 3.1*   < > 3.3* 3.4*  CL 102   < > 98 97*  CO2 22  --  22 20*  GLUCOSE 177*   < > 149* 137*  BUN 6*   < > 5* 5*  CREATININE 0.77   < > 0.66 0.74  CALCIUM 8.6*  --  8.6* 8.9  MG 1.5*  --   --  2.1   < > = values in this interval not displayed.  Lipid Panel:  Recent Labs  Lab 05/23/23 0215  CHOL 197  TRIG 50  HDL 81  CHOLHDL 2.4  VLDL 10  LDLCALC 865*   HgbA1c:  Recent Labs  Lab 05/22/23 1805  HGBA1C 5.7*   Urine Drug Screen:  Recent Labs  Lab 05/22/23 1103  LABOPIA NONE DETECTED  COCAINSCRNUR NONE DETECTED  LABBENZ NONE DETECTED  AMPHETMU NONE DETECTED  THCU NONE DETECTED  LABBARB NONE DETECTED    Alcohol Level  Recent Labs  Lab 05/22/23 1101  ETH <10    IMAGING past 24 hours ECHOCARDIOGRAM COMPLETE  Result Date: 05/23/2023    ECHOCARDIOGRAM REPORT   Patient Name:   [name] Amend Date of Exam:  05/23/2023 Medical Rec #:  [id number]     Height:       67.5 in Accession #:    [id number]    Weight:       200.0 lb Date of Birth:  1961-07-16     BSA:          2.033 m Patient Age:    62 years      BP:           142/63 mmHg Patient Gender: F             HR:           51 bpm. Exam Location:  Inpatient Procedure: 2D Echo, Cardiac Doppler and Color Doppler Indications:    Syncope  History:        Patient has prior history of Echocardiogram examinations, most                 recent 11/10/2018. Previous Myocardial Infarction and coronary  vasospasm, Signs/Symptoms:ICH and Syncope; Risk                 Factors:Hypertension, Former Smoker and Diabetes.  Sonographer:    Wallie Char Referring Phys: [id number] Beulah Gandy PAYNE IMPRESSIONS  1. Left ventricular ejection fraction, by estimation, is 60 to 65%. The left ventricle has normal function. The left ventricle has no regional wall motion abnormalities. Left ventricular diastolic parameters were normal.  2. Right ventricular systolic function is normal. The right ventricular size is moderately enlarged. There is moderately elevated pulmonary artery systolic pressure. The estimated right ventricular systolic pressure is 48.9 mmHg.  3. The mitral valve is normal in structure. No evidence of mitral valve regurgitation. No evidence of mitral stenosis.  4. The aortic valve is tricuspid. Aortic valve regurgitation is not visualized. No aortic stenosis is present.  5. The inferior vena cava is dilated in size with <50% respiratory variability, suggesting right atrial pressure of 15 mmHg. Comparison(s): No significant change from prior study. Prior images reviewed side by side. FINDINGS  Left Ventricle: Left ventricular ejection fraction, by estimation, is 60 to 65%. The left ventricle has normal function. The left ventricle has no regional wall motion abnormalities. The left ventricular internal cavity size was normal in size. There is  no left ventricular hypertrophy.  Left ventricular diastolic parameters were normal. Right Ventricle: The right ventricular size is moderately enlarged. No increase in right ventricular wall thickness. Right ventricular systolic function is normal. There is moderately elevated pulmonary artery systolic pressure. The tricuspid regurgitant  velocity is 2.91 m/s, and with an assumed right atrial pressure of 15 mmHg, the estimated right ventricular systolic pressure is 48.9 mmHg. Left Atrium: Left atrial size was normal in size. Right Atrium: Right atrial size was normal in size. Prominent Crista terminalis. Pericardium: There is no evidence of pericardial effusion. Mitral Valve: The mitral valve is normal in structure. No evidence of mitral valve regurgitation. No evidence of mitral valve stenosis. MV peak gradient, 5.2 mmHg. The mean mitral valve gradient is 2.0 mmHg. Tricuspid Valve: The tricuspid valve is normal in structure. Tricuspid valve regurgitation is mild . No evidence of tricuspid stenosis. Aortic Valve: The aortic valve is tricuspid. Aortic valve regurgitation is not visualized. No aortic stenosis is present. Aortic valve mean gradient measures 3.5 mmHg. Aortic valve peak gradient measures 6.9 mmHg. Aortic valve area, by VTI measures 3.00 cm. Pulmonic Valve: The pulmonic valve was normal in structure. Pulmonic valve regurgitation is not visualized. No evidence of pulmonic stenosis. Aorta: The aortic root and ascending aorta are structurally normal, with no evidence of dilitation. Venous: The inferior vena cava is dilated in size with less than 50% respiratory variability, suggesting right atrial pressure of 15 mmHg. IAS/Shunts: No atrial level shunt detected by color flow Doppler.  LEFT VENTRICLE PLAX 2D LVIDd:         4.60 cm     Diastology LVIDs:         3.20 cm     LV e' medial:    8.59 cm/s LV PW:         0.90 cm     LV E/e' medial:  11.3 LV IVS:        0.80 cm     LV e' lateral:   14.20 cm/s LVOT diam:     2.00 cm     LV E/e'  lateral: 6.8 LV SV:         91 LV SV Index:   45 LVOT Area:  3.14 cm  LV Volumes (MOD) LV vol d, MOD A2C: 97.6 ml LV vol d, MOD A4C: 95.9 ml LV vol s, MOD A2C: 39.2 ml LV vol s, MOD A4C: 40.1 ml LV SV MOD A2C:     58.4 ml LV SV MOD A4C:     95.9 ml LV SV MOD BP:      60.4 ml RIGHT VENTRICLE             IVC RV Basal diam:  4.50 cm     IVC diam: 2.90 cm RV S prime:     15.00 cm/s TAPSE (M-mode): 2.8 cm LEFT ATRIUM             Index        RIGHT ATRIUM           Index LA diam:        3.90 cm 1.92 cm/m   RA Area:     19.90 cm LA Vol (A2C):   51.4 ml 25.28 ml/m  RA Volume:   61.00 ml  30.00 ml/m LA Vol (A4C):   60.8 ml 29.90 ml/m LA Biplane Vol: 57.2 ml 28.13 ml/m  AORTIC VALVE AV Area (Vmax):    3.03 cm AV Area (Vmean):   3.01 cm AV Area (VTI):     3.00 cm AV Vmax:           131.50 cm/s AV Vmean:          86.150 cm/s AV VTI:            0.304 m AV Peak Grad:      6.9 mmHg AV Mean Grad:      3.5 mmHg LVOT Vmax:         127.00 cm/s LVOT Vmean:        82.550 cm/s LVOT VTI:          0.290 m LVOT/AV VTI ratio: 0.96  AORTA Ao Root diam: 3.20 cm Ao Asc diam:  3.00 cm MITRAL VALVE               TRICUSPID VALVE MV Area (PHT): 3.77 cm    TR Peak grad:   33.9 mmHg MV Area VTI:   2.86 cm    TR Vmax:        291.00 cm/s MV Peak grad:  5.2 mmHg MV Mean grad:  2.0 mmHg    SHUNTS MV Vmax:       1.14 m/s    Systemic VTI:  0.29 m MV Vmean:      65.6 cm/s   Systemic Diam: 2.00 cm MV Decel Time: 201 msec MV E velocity: 96.70 cm/s MV A velocity: 59.50 cm/s MV E/A ratio:  1.63 Riley Lam MD Electronically signed by Riley Lam MD Signature Date/Time: 05/23/2023/11:21:06 AM    Final    EEG adult  Result Date: 05/23/2023 Charlsie Quest, MD     05/23/2023  8:40 AM Patient Name: Lindsay [name] MRN: [id number] Epilepsy Attending: Charlsie Quest Referring Physician/Provider: Harolyn Rutherford, MD Date:05/22/2023 Duration: 21.25 mins Patient history:  62 y.o. female presenting after syncopal episode of unclear  etiology. Pt has non-focal neurologic exam with somewhat depressed level of consciousness and CT demonstrates traumatic pattern of right frontal hemorrhage although there is some SAH primarily in the right Sylvian fissure. EEG to evaluate for seizure. Level of alertness: Awake, drowsy AEDs during EEG study: LEV Technical aspects: This EEG study was done with scalp electrodes positioned according to the 10-20  International system of electrode placement. Electrical activity was reviewed with band Funches filter of 1-70Hz , sensitivity of 7 uV/mm, display speed of 68mm/sec with a 60Hz  notched filter applied as appropriate. EEG data were recorded continuously and digitally stored.  Video monitoring was available and reviewed as appropriate. Description: The posterior dominant rhythm consists of 8-9 Hz activity of moderate voltage (25-35 uV) seen predominantly in posterior head regions, symmetric and reactive to eye opening and eye closing. Drowsiness was characterized by attenuation of the posterior background rhythm. Hyperventilation and photic stimulation were not performed.   IMPRESSION: This study is within normal limits. No seizures or epileptiform discharges were seen throughout the recording. A normal interictal EEG does not exclude the diagnosis of epilepsy. [name]   CT HEAD WO CONTRAST ( )  Result Date: 05/22/2023 CLINICAL DATA:  Follow-up examination for hemorrhage. EXAM: CT HEAD WITHOUT CONTRAST TECHNIQUE: Contiguous axial images were obtained from the base of the skull through the vertex without intravenous contrast. RADIATION DOSE REDUCTION: This exam was performed according to the departmental dose-optimization program which includes automated exposure control, adjustment of the mA and/or kV according to patient size and/or use of iterative reconstruction technique. COMPARISON:  CT from earlier the same day. FINDINGS: Brain: Scattered posttraumatic intraparenchymal and subarachnoid hemorrhage  at the anterior right frontal convexity, slightly increased in prominence from previous. Largest component now measures up to 3 cm in diameter. Mild mass effect with no more than trace right-to-left shift, stable. No other new intracranial hemorrhage. No acute large vessel territory infarct. No mass lesion or hydrocephalus. No significant extra-axial collection. Vascular: No abnormal hyperdense vessel. Skull: Evolving left posterior scalp contusion with laceration. Skin staples in place. There is an underlying acute nondisplaced, non this pressed occipital skull fracture (series 3, image 21). Inferior extension towards the right occipital condyle (series 3, image 14). Sinuses/Orbits: Globes orbital soft tissues within normal limits. Chronic right frontoethmoidal and maxillary sinusitis. Mastoid air cells and middle ear cavities are largely clear. Other: None. IMPRESSION: 1. Scattered posttraumatic subarachnoid hemorrhage with superimposed hemorrhagic contusions at the anterior right frontal convexity, slightly increased in prominence from previous. Largest component now measures up to 3 cm in diameter. Mild mass effect with no more than trace right-to-left shift, stable. 2. Evolving left posterior scalp contusion with laceration. Underlying acute nondisplaced, nondepressed occipital skull fracture. 3. Chronic right frontoethmoidal and maxillary sinusitis. Electronically Signed   By: Rise Mu M.D.   On: 05/22/2023 23:15   CT ANGIO HEAD NECK W WO CM  Result Date: 05/22/2023 CLINICAL DATA:  Evaluate for aneurysm EXAM: CT ANGIOGRAPHY HEAD AND NECK WITH AND WITHOUT CONTRAST TECHNIQUE: Multidetector CT imaging of the head and neck was performed using the standard protocol during bolus administration of intravenous contrast. Multiplanar CT image reconstructions and MIPs were obtained to evaluate the vascular anatomy. Carotid stenosis measurements (when applicable) are obtained utilizing NASCET criteria,  using the distal internal carotid diameter as the denominator. RADIATION DOSE REDUCTION: This exam was performed according to the departmental dose-optimization program which includes automated exposure control, adjustment of the mA and/or kV according to patient size and/or use of iterative reconstruction technique. CONTRAST:  75mL OMNIPAQUE IOHEXOL 350 MG/ML SOLN COMPARISON:  No prior CTA available, correlation is made with CT head and cervical spine 05/22/2023 FINDINGS: CT HEAD FINDINGS For noncontrast findings, please see same day CT head. CTA NECK FINDINGS Aortic arch: Two-vessel arch with a common origin of the brachiocephalic and left common carotid arteries. Imaged portion shows no evidence  of aneurysm or dissection. No significant stenosis of the major arch vessel origins. Right carotid system: No evidence of stenosis, dissection, or occlusion. Left carotid system: No evidence of stenosis, dissection, or occlusion. Vertebral arteries: Codominant. No evidence of stenosis, dissection, or occlusion. Skeleton: No acute osseous abnormality. Degenerative changes in the cervical spine. Other neck: Negative. Upper chest: No focal pulmonary opacity or pleural effusion. Review of the MIP images confirms the above findings CTA HEAD FINDINGS Anterior circulation: Both internal carotid arteries are patent to the termini, without significant stenosis. Patent left A1. Aplastic right A1. Normal anterior communicating artery. Anterior cerebral arteries are patent to their distal aspects without significant stenosis. No M1 stenosis or occlusion. MCA branches perfused to their distal aspects without significant stenosis. Posterior circulation: Vertebral arteries patent to the vertebrobasilar junction without significant stenosis. Basilar patent to its distal aspect without significant stenosis. Superior cerebellar arteries patent proximally. Patent P1 segments. PCAs perfused to their distal aspects without significant  stenosis. The left posterior communicating artery is likely patent. Venous sinuses: As permitted by contrast timing, patent. Anatomic variants: Aplastic right A1. Review of the MIP images confirms the above findings No evidence of active extravasation in the right frontal lobe. IMPRESSION: 1. No intracranial large vessel occlusion or significant stenosis. 2. No hemodynamically significant stenosis in the neck. 3. No evidence of active extravasation in the right frontal lobe. Electronically Signed   By: Wiliam Ke M.D.   On: 05/22/2023 18:44   CT Angio Chest PE W and/or Wo Contrast  Result Date: 05/22/2023 CLINICAL DATA:  Syncope. EXAM: CT ANGIOGRAPHY CHEST WITH CONTRAST TECHNIQUE: Multidetector CT imaging of the chest was performed using the standard protocol during bolus administration of intravenous contrast. Multiplanar CT image reconstructions and MIPs were obtained to evaluate the vascular anatomy. RADIATION DOSE REDUCTION: This exam was performed according to the departmental dose-optimization program which includes automated exposure control, adjustment of the mA and/or kV according to patient size and/or use of iterative reconstruction technique. CONTRAST:  65mL OMNIPAQUE IOHEXOL 350 MG/ML SOLN COMPARISON:  Chest x-ray earlier 05/22/2023. Prior CT angiogram chest 11/12/2018 FINDINGS: Cardiovascular: No segmental or larger pulmonary embolism identified. Heart is nonenlarged. Trace pericardial fluid. The thoracic aorta has a normal course and caliber with mild atherosclerotic calcified plaque. There is a bovine type aortic arch, a normal variant. Coronary artery calcifications are seen. Please correlate for other coronary risk factors. Mediastinum/Nodes: Large hiatal hernia identified. No specific abnormal lymph node enlargement identified in the axillary regions, hilum or mediastinum. Preserved thyroid gland. Lungs/Pleura: There is some linear opacity lung bases likely scar or atelectasis. No  consolidation, pneumothorax or effusion. Upper Abdomen: Nodular thickening of both adrenal glands. These could be related to adenomas but are indeterminate on this contrast chest CT for pulmonary embolism. Recommend dedicated workup when appropriate. Musculoskeletal: Moderate diffuse degenerative changes along the spine. Streak artifact related to the patient's arms being scanned across the chest. Review of the MIP images confirms the above findings. IMPRESSION: No pulmonary embolism identified. Large hiatal hernia. Bilateral adrenal nodules. Possibly adenomas but indeterminate on this examination. Please correlate for any known history or dedicated workup when appropriate. Coronary artery calcifications. Please correlate for other coronary risk factors. Aortic Atherosclerosis (ICD10-I70.0). Electronically Signed   By: Karen Kays M.D.   On: 05/22/2023 16:15   CT Head Wo Contrast  Result Date: 05/22/2023 CLINICAL DATA:  Trauma EXAM: CT HEAD WITHOUT CONTRAST CT CERVICAL SPINE WITHOUT CONTRAST TECHNIQUE: Multidetector CT imaging of the head and cervical spine  was performed following the standard protocol without intravenous contrast. Multiplanar CT image reconstructions of the cervical spine were also generated. RADIATION DOSE REDUCTION: This exam was performed according to the departmental dose-optimization program which includes automated exposure control, adjustment of the mA and/or kV according to patient size and/or use of iterative reconstruction technique. COMPARISON:  None Available. FINDINGS: CT HEAD FINDINGS Brain: Hemorrhage in the anterior right frontal lobe, likely contusion, which is associated with surrounding hypodensity, likely edema. Additional subdural and subarachnoid hemorrhage along the right frontal convexity. The subdural hemorrhage measures up to 4 mm. 4 mm right-to-left midline shift. No evidence of acute infarct, mass, or hydrocephalus. The ventricles appear quite small, and the basilar  cisterns appear effaced, concerning for more diffuse cerebral edema Vascular: No hyperdense vessel. Skull: Negative for fracture or focal lesion. Hematoma along the right parietal scalp. Small foci of air along the left occipital scalp with likely laceration. Small left suboccipital hematoma. Sinuses/Orbits: Complete opacification of right frontal sinus and right maxillary sinus. Near complete opacification of the left maxillary sinus. Air-fluid level in the left sphenoid sinus. Opacification of the anterior right ethmoid air cells. Dysconjugate gaze. Possible right proptosis. No evidence trauma in the orbits. Other: The mastoid air cells are well aerated. CT CERVICAL SPINE FINDINGS Alignment: No traumatic listhesis. Reversal of the normal cervical lordosis, with mild anterolisthesis of C4 on C5 and retrolisthesis of C5 on C6, which appears facet mediated. Osseous fusion across the C4-C5 facets and posterior disc space. Skull base and vertebrae: No acute fracture or suspicious osseous lesion. Soft tissues and spinal canal: No prevertebral fluid or swelling. No visible canal hematoma. Disc levels: Degenerative changes in the cervical spine. No high-grade spinal canal stenosis. Upper chest: For findings in the thorax, please see same day CT chest. IMPRESSION: 1. Hemorrhage in the anterior right frontal lobe, likely contusion, with surrounding edema. Additional subdural and subarachnoid hemorrhage along the right frontal convexity, with 4 mm right-to-left midline shift. 2. The ventricles appear quite small, and the basilar cisterns appear effaced, concerning for more diffuse cerebral edema. 3. No acute fracture or traumatic listhesis in the cervical spine. These results were called by telephone at the time of interpretation on 05/22/2023 at 3:07 pm to provider Vivien Rossetti , who verbally acknowledged these results. Electronically Signed   By: Wiliam Ke M.D.   On: 05/22/2023 15:08   CT Cervical Spine Wo  Contrast  Result Date: 05/22/2023 CLINICAL DATA:  Trauma EXAM: CT HEAD WITHOUT CONTRAST CT CERVICAL SPINE WITHOUT CONTRAST TECHNIQUE: Multidetector CT imaging of the head and cervical spine was performed following the standard protocol without intravenous contrast. Multiplanar CT image reconstructions of the cervical spine were also generated. RADIATION DOSE REDUCTION: This exam was performed according to the departmental dose-optimization program which includes automated exposure control, adjustment of the mA and/or kV according to patient size and/or use of iterative reconstruction technique. COMPARISON:  None Available. FINDINGS: CT HEAD FINDINGS Brain: Hemorrhage in the anterior right frontal lobe, likely contusion, which is associated with surrounding hypodensity, likely edema. Additional subdural and subarachnoid hemorrhage along the right frontal convexity. The subdural hemorrhage measures up to 4 mm. 4 mm right-to-left midline shift. No evidence of acute infarct, mass, or hydrocephalus. The ventricles appear quite small, and the basilar cisterns appear effaced, concerning for more diffuse cerebral edema Vascular: No hyperdense vessel. Skull: Negative for fracture or focal lesion. Hematoma along the right parietal scalp. Small foci of air along the left occipital scalp with likely laceration. Small  left suboccipital hematoma. Sinuses/Orbits: Complete opacification of right frontal sinus and right maxillary sinus. Near complete opacification of the left maxillary sinus. Air-fluid level in the left sphenoid sinus. Opacification of the anterior right ethmoid air cells. Dysconjugate gaze. Possible right proptosis. No evidence trauma in the orbits. Other: The mastoid air cells are well aerated. CT CERVICAL SPINE FINDINGS Alignment: No traumatic listhesis. Reversal of the normal cervical lordosis, with mild anterolisthesis of C4 on C5 and retrolisthesis of C5 on C6, which appears facet mediated. Osseous fusion  across the C4-C5 facets and posterior disc space. Skull base and vertebrae: No acute fracture or suspicious osseous lesion. Soft tissues and spinal canal: No prevertebral fluid or swelling. No visible canal hematoma. Disc levels: Degenerative changes in the cervical spine. No high-grade spinal canal stenosis. Upper chest: For findings in the thorax, please see same day CT chest. IMPRESSION: 1. Hemorrhage in the anterior right frontal lobe, likely contusion, with surrounding edema. Additional subdural and subarachnoid hemorrhage along the right frontal convexity, with 4 mm right-to-left midline shift. 2. The ventricles appear quite small, and the basilar cisterns appear effaced, concerning for more diffuse cerebral edema. 3. No acute fracture or traumatic listhesis in the cervical spine. These results were called by telephone at the time of interpretation on 05/22/2023 at 3:07 pm to provider Vivien Rossetti , who verbally acknowledged these results. Electronically Signed   By: Wiliam Ke M.D.   On: 05/22/2023 15:08    PHYSICAL EXAM  Temp:  [98.1 F (36.7 C)-99.8 F (37.7 C)] 98.1 F (36.7 C) (06/12 1200) Pulse Rate:  [34-88] 52 (06/12 1345) Resp:  [11-30] 18 (06/12 0800) BP: (98-150)/(48-84) 98/84 (06/12 1345) SpO2:  [67 %-100 %] 67 % (06/12 1345) Weight:  [90.7 kg] 90.7 kg (06/12 0435)  General - Well nourished, well developed, in no apparent distress  Mental Status -  Level of arousal and orientation to time, place, and person were intact. Language including expression, naming, repetition, comprehension was assessed and found intact. Attention span and concentration were normal, Recent and remote memory were intact.  Cranial Nerves II - XII - II - Visual field intact OU. III, IV, VI - Extraocular movements intact. V - Facial sensation intact bilaterally. VII - Facial movement intact bilaterally. VIII - Hearing & vestibular intact bilaterally. X - Palate elevates symmetrically. XI -  Chin turning & shoulder shrug intact bilaterally. XII - Tongue protrusion intact.  Motor Strength - The patient's strength was normal in all extremities and pronator drift was absent.  Bulk was normal and fasciculations were absent.   Motor Tone - Muscle tone was assessed at the neck and appendages and was normal. Sensory - Light touch, temperature/pinprick were assessed and were symmetrical.   Coordination - The patient had normal movements in the hands and feet with no ataxia or dysmetria.  Tremor was absent.  Gait and Station - deferred.   ASSESSMENT/PLAN BRELY GATZEMEYER is a 62 y.o. 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  She was brought to the ER and was found to have right frontal lobe hemorrhage with 4 mm shift with edema and some subarachnoid blood.  Neurosurgery consulted by ER physician.  Placed on Keppra 500 twice daily.  Routine EEG negative.  On exam today patient has no focal weakness or confusion.  Does endorse frontal headache, Fioricet added as needed as Tylenol given prior did not help.  ICH -  traumatic with right frontal contusion with acute occipital skull fracture, type of contrecoup injury CT head:.  Right frontal lobe hemorrhage with surrounding edema.  4 mm right to left midline shift.  Additional subdural and subarachnoid hemorrhages along the right frontal convexity. CTA head & neck: No LVO or significant stenosis in head or neck. Repeat CT head: Scattered posttraumatic subarachnoid hemorrhage with superimposed hemorrhagic contusions in the anterior right frontal convexity, slightly increased.  Mild mild mass effect with no more than trace right to left shift, stable. Evolving left posterior scalp contusion with laceration. Underlying acute nondisplaced nondepressed occipital skull fracture 2D Echo: EF 60 to 65%, no significant change from prior study. LDL 106 HgbA1c  5.7 Ammonia 15 VTE prophylaxis - SCDs No antithrombotic prior to admission, now on No antithrombotic due to ICH Therapy recommendations:  CIR Disposition:  pending  ? Syncope  Per family, patient was in standing position, made a turn and fall to the ground. No clear mechanical fall Patient has following with Dr.Acharya cardiology 2D echo EF 60 to 65%. Cardiology consulted May consider heart monitoring as outpatient  HA Patient complaining of bifrontal headache, 9/10 Likely related to current ICH and skull fracture no history of migraine On Tylenol and Fioricet as needed Further management per primary team  Hypertension Home meds:  none Stable SBP goal <160  Long-term BP goal normotensive  Hyperlipidemia Home meds:  none LDL 106, goal < 70 Consider to add statin at discharge  Diabetes type II, controlled Home meds:  [place] HgbA1c 5.7, goal < 7.0 CBGs SSI Outpatient PCP follow-up  Other Stroke Risk Factors Former Cigarette smoker Obesity, Body mass index is 30.86 kg/m., BMI >/= 30 associated with increased stroke risk, recommend weight loss, diet and exercise as appropriate   Other Active Problems Hx of Chronic Pain Gabapentin, Skelaxin, Xanaflex home meds  Hospital day # 1  Pt seen by Neuro NP/APP and later by MD. Note/plan to be edited by MD as needed.    Lynnae January, DNP, AGACNP-BC [facility] Please use AMION for contact information & EPIC for messaging.  ATTENDING NOTE: I reviewed above note and agree with the assessment and plan. Pt was seen and examined.   Son and daughter are at bedside.  Patient lying in bed, complaining of headache, bifrontal sharp pain 9/10 with nauseous but no vomiting.  On Tylenol as needed, will add Fioricet as needed too.  Per family, patient was on standing position, made a turn and she fell to ground.  Question for syncope episode.  Cardiology consulted and may consider heart monitoring as outpatient.  CT head  showed right frontal contusion and left occipital skull fracture, consistent with contrecoup injury.  Repeat CT stable hematoma.  BP goal less than 160.  PT and OT recommend CIR.  For detailed assessment and plan, please refer to above/below as I have made changes wherever appropriate.   Neurology will sign off. Please call with questions. Thanks for the consult.   Marvel Plan, MD PhD Stroke Neurology 05/23/2023 6:09 PM   To contact Stroke Continuity provider, please refer to [email or web address]. After hours, contact General Neurology

## 2023-05-23 NOTE — Progress Notes (Addendum)
eLink Physician-Brief Progress Note Patient Name: Lindsay [name] DOB: [birth year] MRN: [id number]   Date of Service  05/23/2023  HPI/Events of Note   62 year old female with new frontal right contusion with subarachnoid hemorrhage nose previously responsive is now becoming less responsive and unable to be directable.  She received her muscle relaxer not too long ago and went from alert/oriented x 4 to somnolent and unable to assess.  Blood pressures have been within goal of less than 160  She is still moving all 4 extremities spontaneously with appropriate strength.  eICU Interventions  Change in level of consciousness is unlikely to represent worsening acute bleed, although this is hard to rule out.  Will obtain a CT head ASAP and discontinue her muscle relaxer.  Seek alternative forms of analgesia.   2252 -CT personally reviewed, minimal worsening with maybe some worsening midline shift.  Neurosurgery has been made aware.  Patient is hypertensive, I added hydralazine and labetalol as needed.  Switch Protonix from p.o. to IV.  Currently protecting her airway, although low threshold to intubate for airway protection if somnolence persists.  0045 -given worsening intracranial findings, neurosurgery requests initiation of 3% saline.  Unfortunately, the patient has been an extremely difficult stick and unable to obtain regular laboratory studies to track the infusion.  Will require central line placement  0220 - intermittent SVT up to 190's, responded to labetalol x 1.  Ground team at bedside placing A-line.  Otherwise hemodynamically stable.Likely 2/2 hemorrhage progressing  0436 -actively trying to pull out remaining and interfere with medical care, will restraints as needed in setting of delirium  Intervention Category Intermediate Interventions: Change in mental status - evaluation and management  Haden Cavenaugh 05/23/2023, 9:04 PM

## 2023-05-23 NOTE — Procedures (Signed)
Patient Name: Lindsay [name]  MRN: [id number]  Epilepsy Attending: Charlsie Quest  Referring Physician/Provider: Harolyn Rutherford, MD  Date:05/22/2023 Duration: 21.25 mins  Patient history:  62 y.o. female presenting after syncopal episode of unclear etiology. Pt has non-focal neurologic exam with somewhat depressed level of consciousness and CT demonstrates traumatic pattern of right frontal hemorrhage although there is some SAH primarily in the right Sylvian fissure. EEG to evaluate for seizure.  Level of alertness: Awake, drowsy  AEDs during EEG study: LEV  Technical aspects: This EEG study was done with scalp electrodes positioned according to the 10-20 International system of electrode placement. Electrical activity was reviewed with band Volden filter of 1-70Hz , sensitivity of 7 uV/mm, display speed of 33mm/sec with a 60Hz  notched filter applied as appropriate. EEG data were recorded continuously and digitally stored.  Video monitoring was available and reviewed as appropriate.  Description: The posterior dominant rhythm consists of 8-9 Hz activity of moderate voltage (25-35 uV) seen predominantly in posterior head regions, symmetric and reactive to eye opening and eye closing. Drowsiness was characterized by attenuation of the posterior background rhythm. Hyperventilation and photic stimulation were not performed.     IMPRESSION: This study is within normal limits. No seizures or epileptiform discharges were seen throughout the recording.  A normal interictal EEG does not exclude the diagnosis of epilepsy.  Ramsay Bognar Annabelle Harman

## 2023-05-23 NOTE — Progress Notes (Signed)
Pts home med, Skelaxin 800mg , re-ordered and administered today at 1510. Pt has been more drowsy over the last 2 hours, and struggling to answer orientation questions. Still moves all extremities, and pupils briskly reactive. RN concerned; however, half life of the medication is 8-9 hours with a mean time or 4.5 hrs. Given this information, I am currently holding off on contacting a provider and handed off to the night shift nurse my concerns.

## 2023-05-23 NOTE — Progress Notes (Signed)
Dayshift RN reported to this RN that the patient had become more drowsy for the last 2 hours of their shift. Skelaxin had been given to the patient at 1510. Dayshift RN expected this change to be contributed to the longer half life of this medication.   On this RN's initial assessment, pt could only tell me her name, required more stimulation to respond, could only state her age but not the month. RN was concerned that this presentation could be a neuro change instead of medication related. This RN called e-Link to consult with the provider about the medication, and MD agreed that a head CT should be preformed.   RN took patient for head CT and notified Neurosurgery NP Doran Durand). At this time the patient was now no longer opening her eyes, not speaking at all, moving extremities purposefully but not to command.   Ultrasound guided IV was placed, and hypertonic saline was ordered and started.

## 2023-05-23 NOTE — TOC CM/SW Note (Signed)
Transition of Care [facility]) - Inpatient Brief Assessment   Patient Details  Name: Lindsay [name] MRN: [id number] Date of Birth: 1961/05/03  Transition of Care [facility]) CM/SW Contact:    Glennon Mac, RN Phone Number: 05/23/2023, 5:18 PM   Clinical Narrative: 62 y.o. female brought to the hospital after she apparently passed out at work, striking the back of her head. CT demonstrates traumatic pattern of right frontal hemorrhage although there is some SAH primarily in the right Sylvian fissure.  PTA, pt independent and living at home alone; her son is working on getting 24/7 assist for patient at [employer]. CIR consult pending.   Transition of Care Asessment: Insurance and Status: Insurance coverage has been reviewed Patient has primary care physician: Yes Irena Reichmann) Home environment has been reviewed: Lives alone in apartment; has son Prior level of function:: Independent, working Forensic psychologist: No current home services Social Determinants of Health Reivew: SDOH reviewed no interventions necessary Readmission risk has been reviewed: Yes Transition of care needs: transition of care needs identified, TOC will continue to follow  Quintella Baton, RN, BSN  Trauma/Neuro ICU Case Manager [phone]

## 2023-05-23 NOTE — Evaluation (Signed)
Physical Therapy Evaluation Patient Details Name: Lindsay [name] MRN: [id number] DOB: 1961-05-11 Today's Date: 05/23/2023  History of Present Illness  62 y.o. female brought to the hospital after she apparently passed out at work, striking the back of her head. CT demonstrates traumatic pattern of right frontal hemorrhage although there is some SAH primarily in the right Sylvian fissure.  Clinical Impression  Patient presents with decreased mobility due to limited attention, awareness, memory and decreased strength with generalized weakness.  Today able to sit up with min A and transfer to recliner with RW.  Displaying Ranchos Level V cognitive recovery.  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  She will benefit from skilled PT in the acute setting and from intensive inpatient rehab prior to d/c home with family support.     Recommendations for follow up therapy are one component of a multi-disciplinary discharge planning process, led by the attending physician.  Recommendations may be updated based on patient status, additional functional criteria and insurance authorization.  Follow Up Recommendations       Assistance Recommended at Discharge Intermittent Supervision/Assistance  Patient can return home with the following  A little help with walking and/or transfers;Assistance with cooking/housework;Assist for transportation;A little help with bathing/dressing/bathroom;Help with stairs or ramp for entrance;Direct supervision/assist for medications management    Equipment Recommendations None recommended by PT  Recommendations for Other Services  Rehab consult    Functional Status Assessment Patient has had a recent decline in their functional status and demonstrates the ability to make significant improvements in function in a reasonable and predictable amount of time.     Precautions / Restrictions Precautions Precautions:  Fall Restrictions Weight Bearing Restrictions: No      Mobility  Bed Mobility Overal bed mobility: Needs Assistance Bed Mobility: Supine to Sit     Supine to sit: HOB elevated, Mod assist     General bed mobility comments: assist for initiation and follow through as pt lethargic    Transfers Overall transfer level: Needs assistance Equipment used: Rolling walker (2 wheels) Transfers: Sit to/from Stand Sit to Stand: Min assist   Step pivot transfers: Min guard       General transfer comment: stepping to recliner with A for lines mainly and minguard for follow through    Ambulation/Gait               General Gait Details: deferred due to lethargy  Stairs            Wheelchair Mobility    Modified Rankin (Stroke Patients Only)       Balance Overall balance assessment: Needs assistance   Sitting balance-Leahy Scale: Good Sitting balance - Comments: moving legs on and off bed while looking at menu to determine order, able to lift and lower trunk unsupported with HOB up   Standing balance support: Bilateral upper extremity supported Standing balance-Leahy Scale: Poor Standing balance comment: UE support on RW in standing                             Pertinent Vitals/Pain Pain Assessment Pain Score: 9  Pain Location: head Pain Descriptors / Indicators: Headache Pain Intervention(s): Monitored during session, Premedicated before session    Home Living Family/patient expects to be discharged to:: Private residence Living Arrangements: Alone Available Help at Discharge: Family (son states working on getting 24/7 assist for d/c) Type of Home: Apartment Home Access: Level  entry       Home Layout: One level Home Equipment: Rollator (4 wheels);Shower seat;Cane - single point      Prior Function Prior Level of Function : Driving;Independent/Modified Independent;Working/employed             Mobility Comments: Production designer, theatre/television/film for  community distances and cane for in home. ADLs Comments: Mod I-independent level     Hand Dominance   Dominant Hand: Right    Extremity/Trunk Assessment   Upper Extremity Assessment Upper Extremity Assessment: Defer to OT evaluation    Lower Extremity Assessment Lower Extremity Assessment: Generalized weakness    Cervical / Trunk Assessment Cervical / Trunk Assessment: Kyphotic  Communication   Communication: No difficulties  Cognition Arousal/Alertness: Lethargic Behavior During Therapy: Flat affect, Impulsive Overall Cognitive Status: Impaired/Different from baseline Area of Impairment: Orientation, Attention, Memory, Following commands, Safety/judgement, Awareness, Problem solving, Rancho level               Rancho Levels of Cognitive Functioning [place] Amigos Scales of Cognitive Functioning: Confused, Appropriate Orientation Level: Disoriented to, Situation, Time, Place Current Attention Level: Focused Memory: Decreased short-term memory Following Commands: Follows one step commands with increased time, Follows one step commands inconsistently Safety/Judgement: Decreased awareness of safety, Decreased awareness of deficits   Problem Solving: Slow processing, Decreased initiation, Difficulty sequencing, Requires verbal cues General Comments: needing assist for arousal throughout session, assisted to order dinner and lunch as she would drift off and not respond to questions from hospitality associate to order food   [employer] Scales of Cognitive Functioning: Confused, Appropriate    General Comments General comments (skin integrity, edema, etc.): VSS throughout; set up food on b/s table and encourage pt to eat, but kept falling back to sleep, RN aware    Exercises     Assessment/Plan    PT Assessment Patient needs continued PT services  PT Problem List Decreased strength;Decreased cognition;Decreased safety awareness;Pain;Decreased  balance;Decreased activity tolerance;Decreased mobility;Decreased knowledge of use of DME       PT Treatment Interventions DME instruction;Balance training;Gait training;Stair training;Cognitive remediation;Functional mobility training;Patient/family education;Therapeutic activities;Therapeutic exercise    PT Goals (Current goals can be found in the Care Plan section)  Acute Rehab PT Goals Patient Stated Goal: to return to indepedent PT Goal Formulation: With patient Time For Goal Achievement: 06/06/23 Potential to Achieve Goals: Good    Frequency Min 4X/week     Co-evaluation               AM-PAC PT "6 Clicks" Mobility  Outcome Measure Help needed turning from your back to your side while in a flat bed without using bedrails?: A Lot Help needed moving from lying on your back to sitting on the side of a flat bed without using bedrails?: A Lot Help needed moving to and from a bed to a chair (including a wheelchair)?: A Little Help needed standing up from a chair using your arms (e.g., wheelchair or bedside chair)?: A Little Help needed to walk in hospital room?: Total Help needed climbing 3-5 steps with a railing? : Total 6 Click Score: 12    End of Session Equipment Utilized During Treatment: Gait belt Activity Tolerance: Patient limited by fatigue Patient left: in chair;with call bell/phone within reach;with chair alarm set Nurse Communication: Mobility status PT Visit Diagnosis: Other abnormalities of gait and mobility (R26.89);Other symptoms and signs involving the nervous system (R29.898);History of falling (Z91.81)    Time: 1340-1405 PT Time Calculation (min) (ACUTE ONLY): 25 min  Charges:   PT Evaluation $PT Eval Moderate Complexity: 1 Mod PT Treatments $Therapeutic Activity: 8-22 mins        Sheran Lawless, PT Acute Rehabilitation Services Office:[phone] 05/23/2023   [name] 05/23/2023, 4:33 PM

## 2023-05-23 NOTE — Progress Notes (Signed)
NAME:  Lindsay [name], MRN:  [id number], DOB:  1961-03-26, LOS: 1 ADMISSION DATE:  05/22/2023, CONSULTATION DATE:  6/11 REFERRING MD:  Dr. Elpidio Anis, CHIEF COMPLAINT:  Head bleed   History of Present Illness:  Patient is a 62 yo Female w/ pertinent PMH coronary artery vasospasm, DMT2, HTN presents to [facility] ED on 6/11 w/ head bleed.  On 6/11, patient was at work and had syncopal episode falling and hit back of head. Not on any blood thinners at home. EMS called. Found patient HR 160s and was given 6 mg adenosine then given 20 mg cardizem converting back to sinus rhythm.   Upon arrival to [facility] ED, patient hemodynamically stable and afebrile. EKG sinus rhythm. CT head showing hemorrhage in the anterior right frontal lobe, likely contusion, with surrounding edema; Additional SDH and SAH along the right frontal convexity, with 4 mm right-to-left midline shift. NSG consulted recommending SBP goal <150 and Keppra 500 bid. Recommend ICU admission for close neuro monitoring. PCCM consulted.  Pertinent ED labs: ethanol and UDS wnl, trop 9, K 3.1, mag 1.5, glucose 177 CTA chest with no PE; large hiatal hernia; b/l adrenal nodules possible adenomas.  Pertinent  Medical History   Past Medical History:  Diagnosis Date   Allergic rhinitis, cause unspecified    seasonal   Anginal pain (HCC) 2013   Stress   Arthritis    hips, thumb,knees   Diabetes mellitus without complication (HCC)    Essential hypertension, benign 12/12/2011   Joint pain    Multiple drug allergies    Obesity    Wears glasses    Wears partial dentures    upper     Significant Hospital Events: Including procedures, antibiotic start and stop dates in addition to other pertinent events   6/11 syncopal episode then hit head; ct head with head bleed and midline shift  Interim History / Subjective:  Today she has a headache and some light sensitivity. Slurred speech she had when she arrived yesterday is better per her children. Tmax  99.8.   Objective   Blood pressure (!) 142/63, pulse 62, temperature 98.4 F (36.9 C), temperature source Oral, resp. rate 18, height 5' 7.5" (1.715 m), weight 90.7 kg, last menstrual period 03/26/2012, SpO2 100 %.        Intake/Output Summary (Last 24 hours) at 05/23/2023 0916 Last data filed at 05/23/2023 0730 Gross per 24 hour  Intake 998.05 ml  Output 800 ml  Net 198.05 ml   Filed Weights   05/22/23 1104 05/23/23 0435  Weight: 90.7 kg 90.7 kg    Examination: General: elderly woman lying in bed in NAD HEENT: [place] Neuro: awake, preferentially keeping her eyes closed. Following commands, tracking to both sides Sensation intact. Normal speech, normal word-finding.  CV: S1S2, irreg thyhm, PVCs PULM:  breathing comfortably on RA, CTAB GI: soft, NT Extremities: no c/c/e  Skin: warm, dry, no diffuse rashes   Tele reviewed> many PVCs and PACs. One episode of 5 beats NSVT.  EEG> WNL, no seizures or epileptiform discharges.  K+ 3.4 BUN 5 Cr 0.74 LA 2.7> 1.3 WBC 9.3 H/H 12.9/40.6 Echo pending   Resolved Hospital Problem list     Assessment & Plan:  Syncopal episode-- worry about potential cardiac etiology. No seizure history. Without oxygen requirement or tachycardia, very low risk this is a PE.  -echo pending -monitor on tele -cardiology consult; anticipate she will need an event monitor as an OP -discussed syncope precautions and no driving with her  children- no driving for 6 months and needs supervision with all high risk activities- ladders, swimming/ bathing, etc   Right Frontal Lobe Intracranial Hemorrhage w/ SDH and SAH; counter-coup injury from falling backwards Small posterior scalp laceration -stable hemorrhage on follow up CT -monitor for deterioration in neuro exam -hold prophylactic AC for 48hrs after injury -appreciate Neuro & NSG management -goal SBP <160; cleviprex PRN -keppra for seizure prophylaxis -avoid fever, hyper/hypoglycemia,  hyponatremia -hold statin and APs -avoid sedating meds -4 staples placed on occipital scalp; will need to be removed in 7-10 days (June 18-21) -will need PT, OT, SLP  Possible SVT: patient found to be in SVT rate 160s when EMS arrived; given adenosine and cardizem, now NSR Hypokalemia Hypomagnesemia -repleted electroyltes, con't to monitor -monitor on tele -worry she may need event monitor after discharge  Lactic acidosis- not sure why this was so refractory; now resolved -no additional monitoring required unless bicarb drops -would avoid low sodium fluids if needing boluses  Pre-DM; A1c 5.7, not on oral hypoglycemics PTA -SSI PRN -goal BG 140-180  Urinary retention -flomax  HTN Hx of coronary vasospasm -cleviprex for goal SBP <160 -hold PTA   Peripheral neuropathy -hold PTA gabapentin; watch for withdrawal  GERD Large hiatal hernia -PPI; needs for while admission with head bleed  Adrenal nodules (possible adenomas) -needs OP evaluation   Son & daughter updated at bedside during rounds.    Best Practice (right click and "Reselect all SmartList Selections" daily)   Diet/type: Regular consistency (see orders) DVT prophylaxis: SCD GI prophylaxis: PPI Lines: N/A Foley:  N/A Code Status:  full code Last date of multidisciplinary goals of care discussion [6/11 updated daughter and son at bedside]  Labs   CBC: Recent Labs  Lab 05/22/23 1102 05/22/23 1114 05/23/23 0215  WBC 6.1  --  9.3  HGB 12.8 13.9 12.9  HCT 40.5 41.0 40.6  MCV 77.4*  --  76.9*  PLT 226  --  198     Basic Metabolic Panel: Recent Labs  Lab 05/22/23 1102 05/22/23 1114 05/22/23 1805 05/23/23 0215  NA 138 139 134* 136  K 3.1* 3.6 3.3* 3.4*  CL 102 103 98 97*  CO2 22  --  22 20*  GLUCOSE 177* 173* 149* 137*  BUN 6* 7* 5* 5*  CREATININE 0.77 0.70 0.66 0.74  CALCIUM 8.6*  --  8.6* 8.9  MG 1.5*  --   --  2.1       Critical care time:        Steffanie Dunn, DO 05/23/23  9:16 AM [facility]  For contact information, see Amion. If no response to pager, please call PCCM consult pager. After hours, 7PM- 7AM, please call Elink.

## 2023-05-23 NOTE — Evaluation (Signed)
Occupational Therapy Evaluation Patient Details Name: Lindsay [name] MRN: [id number] DOB: 31-Oct-1961 Today's Date: 05/23/2023   History of Present Illness 62 y.o. female brought to the hospital after she apparently passed out at work, striking the back of her head. CT demonstrates traumatic pattern of right frontal hemorrhage although there is some SAH primarily in the right Sylvian fissure.   Clinical Impression   Pt admitted with the above diagnosis. Pt currently with functional limitations due to the deficits listed below (see OT Problem List). Prior to admit, pt was living alone completing all BADL tasks and functional mobility at Mod I level. Pt will benefit from acute skilled OT to increase their safety and independence with ADL and functional mobility for ADL to facilitate discharge. Patient will benefit from intensive inpatient follow up therapy, >3 hours/day. OT will continue to follow patient acutely.         Recommendations for follow up therapy are one component of a multi-disciplinary discharge planning process, led by the attending physician.  Recommendations may be updated based on patient status, additional functional criteria and insurance authorization.   Assistance Recommended at Discharge Frequent or constant Supervision/Assistance  Patient can return home with the following A little help with walking and/or transfers;A little help with bathing/dressing/bathroom;Help with stairs or ramp for entrance;Assist for transportation;Assistance with cooking/housework;Direct supervision/assist for financial management;Direct supervision/assist for medications management    Functional Status Assessment  Patient has had a recent decline in their functional status and demonstrates the ability to make significant improvements in function in a reasonable and predictable amount of time.  Equipment Recommendations  Other (comment) (defer to next venue of care)    Recommendations for Other  Services Rehab consult;Speech consult     Precautions / Restrictions Precautions Precautions: Fall Restrictions Weight Bearing Restrictions: No      Mobility Bed Mobility Overal bed mobility: Needs Assistance Bed Mobility: Supine to Sit, Sit to Supine     Supine to sit: Supervision, HOB elevated Sit to supine: Min assist   General bed mobility comments: assist to bring BLE back into bed Patient Response: Flat affect, Impulsive  Transfers Overall transfer level: Needs assistance Equipment used: 1 person hand held assist Transfers: Sit to/from Stand, Bed to chair/wheelchair/BSC Sit to Stand: Min guard     Step pivot transfers: Min assist     General transfer comment: RW not available for use during session. Pt provided with 1 person hand held assist. VC for safety awareness especially with IV pole and line management.      Balance Overall balance assessment: Needs assistance Sitting-balance support: No upper extremity supported, Feet supported Sitting balance-Leahy Scale: Good Sitting balance - Comments: donning socks while seated on EOB   Standing balance support: Bilateral upper extremity supported, During functional activity Standing balance-Leahy Scale: Poor Standing balance comment: reliant on sink while standing to brush teeth.     ADL either performed or assessed with clinical judgement   ADL Overall ADL's : Needs assistance/impaired Eating/Feeding: Set up;Bed level   Grooming: Oral care;Supervision/safety;Standing   Upper Body Bathing: Set up;Sitting   Lower Body Bathing: Minimal assistance;Sit to/from stand   Upper Body Dressing : Minimal assistance;Sitting   Lower Body Dressing: Sit to/from stand;Min guard Lower Body Dressing Details (indicate cue type and reason): able to doff socks in bed. Donned socks while seated on bed. Toilet Transfer: Minimal Holiday representative;Ambulation;Grab bars;Cueing for safety   Toileting- Clothing  Manipulation and Hygiene: Min guard;Sitting/lateral lean;Sit to/from stand  Functional mobility during ADLs: Minimal assistance;Cueing for safety       Vision Baseline Vision/History: 1 Wears glasses (reading) Ability to See in Adequate Light: 0 Adequate Patient Visual Report: No change from baseline Additional Comments: unable to assess fully during session. Pt kept eyes closed due to headache and light sensitivity.            Pertinent Vitals/Pain Pain Assessment Pain Assessment: Faces Faces Pain Scale: Hurts whole lot Pain Location: head Pain Descriptors / Indicators: Headache Pain Intervention(s): Limited activity within patient's tolerance, Monitored during session     Hand Dominance Right   Extremity/Trunk Assessment Upper Extremity Assessment Upper Extremity Assessment: Overall WFL for tasks assessed   Lower Extremity Assessment Lower Extremity Assessment: Defer to PT evaluation   Cervical / Trunk Assessment Cervical / Trunk Assessment: Kyphotic   Communication Communication Communication: No difficulties   Cognition Arousal/Alertness: Lethargic Behavior During Therapy: Flat affect, Impulsive Overall Cognitive Status: Impaired/Different from baseline Area of Impairment: Orientation, Attention, Memory, Following commands, Safety/judgement, Awareness, Problem solving        Orientation Level: Disoriented to, Situation, Time Current Attention Level: Focused Memory: Decreased short-term memory Following Commands: Follows one step commands with increased time, Follows one step commands inconsistently Safety/Judgement: Decreased awareness of safety, Decreased awareness of deficits   Problem Solving: Slow processing, Decreased initiation, Difficulty sequencing, Requires verbal cues General Comments: Stated that her cream for psoriasis was in the bathroom on the counter (it was not). Asked for a glass of ice cold water several times during session even though  OT told her that she would get her some at the end of the session. Pt asked her son several times where her "Bullet," was (it's her water cup).     General Comments  BP sitting EOB: 134/69, SpO2 98% RA            Home Living Family/patient expects to be discharged to:: Private residence Living Arrangements: Alone Available Help at Discharge: Family (Son states that they are currently working on having someone stay with her 24/7 when discharged.) Type of Home: Apartment Home Access: Level entry     Home Layout: One level     Bathroom Shower/Tub: Producer, television/film/video: Standard     Home Equipment: Rollator (4 wheels);Shower seat;Cane - single point          Prior Functioning/Environment Prior Level of Function : Driving;Independent/Modified Independent;Working/employed             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 ADLs Comments: Mod I-independent level        OT Problem List: Decreased cognition;Decreased knowledge of use of DME or AE;Impaired balance (sitting and/or standing);Decreased activity tolerance;Decreased safety awareness      OT Treatment/Interventions: Self-care/ADL training;Therapeutic exercise;Therapeutic activities;Cognitive remediation/compensation;Energy conservation;DME and/or AE instruction;Patient/family education;Balance training    OT Goals(Current goals can be found in the care plan section) Acute Rehab OT Goals Patient Stated Goal: to go to the bathroom OT Goal Formulation: Patient unable to participate in goal setting Time For Goal Achievement: 06/06/23 Potential to Achieve Goals: Good  OT Frequency: Min 2X/week       AM-PAC OT "6 Clicks" Daily Activity     Outcome Measure Help from another person eating meals?: A Little Help from another person taking care of personal grooming?: A Little Help from another person toileting, which includes using toliet, bedpan, or urinal?: A  Little Help from another person bathing (including washing, rinsing,  drying)?: A Little Help from another person to put on and taking off regular upper body clothing?: A Little Help from another person to put on and taking off regular lower body clothing?: A Little 6 Click Score: 18   End of Session Equipment Utilized During Treatment: Gait belt Nurse Communication: Mobility status;Other (comment) (cognition during session)  Activity Tolerance: Patient limited by fatigue;Patient limited by lethargy Patient left: in bed;with call bell/phone within reach;with bed alarm set  OT Visit Diagnosis: Unsteadiness on feet (R26.81);Muscle weakness (generalized) (M62.81);Other symptoms and signs involving cognitive function                Time: 1610-9604 OT Time Calculation (min): 42 min Charges:  OT General Charges $OT Visit: 1 Visit OT Evaluation $OT Eval High Complexity: 1 High OT Treatments $Self Care/Home Management : 23-37 mins Limmie Patricia, OTR/L,CBIS  Supplemental OT - [facility] and [facility] Secure Chat Preferred    Sydnie Sigmund, Charisse March 05/23/2023, 1:14 PM

## 2023-05-23 NOTE — TOC CAGE-AID Note (Signed)
Transition of Care [facility]) - CAGE-AID Screening  Patient Details  Name: Lindsay [name] MRN: [id number] Date of Birth: [birth year]  Transition of Care [facility]) CM/SW Contact:    Vanessa Barbara, RN Phone Number: 05/23/2023, 5:57 PM   Clinical Narrative:  Patient admitted with intracranial hemorrhage. Patient forgetful and confused. Patient unable to accurately participate in substance abuse screen at this time.  CAGE-AID Screening: Substance Abuse Screening unable to be completed due to: : Patient unable to participate

## 2023-05-23 NOTE — Progress Notes (Signed)
[facility] ADULT ICU REPLACEMENT PROTOCOL   The patient does apply for the [facility] Adult ICU Electrolyte Replacment Protocol based on the criteria listed below:   1.Exclusion criteria: TCTS, ECMO, Dialysis, and Myasthenia Gravis patients 2. Is GFR >/= 30 ml/min? Yes.    Patient's GFR today is >60 3. Is SCr </= 2? Yes.   Patient's SCr is 0.74 mg/dL 4. Did SCr increase >/= 0.5 in 24 hours? No. 5.Pt's weight >40kg  Yes.   6. Abnormal electrolyte(s):   K 3.4  7. Electrolytes replaced per protocol 8.  Call MD STAT for K+ </= 2.5, Phos </= 1, or Mag </= 1 Physician:  Shawn Stall R Lyah Millirons 05/23/2023 4:14 AM

## 2023-05-23 NOTE — Progress Notes (Signed)
Inpatient Rehab Admissions Coordinator:  ° °Per therapy recommendations patient was screened for CIR candidacy by Clothilde Tippetts, MS, CCC-SLP. At this time, Pt. Appears to be a a potential candidate for CIR. I will place   order for rehab consult per protocol for full assessment. Please contact me any with questions. ° °Galilea Quito, MS, CCC-SLP °Rehab Admissions Coordinator  °[phone] (celll) °[phone] (office) ° °

## 2023-05-23 NOTE — Progress Notes (Signed)
An USGPIV (ultrasound guided PIV) has been placed for short-term vasopressor infusion. A correctly placed ivWatch must be used when administering Vasopressors. Should this treatment be needed beyond 72 hours, central line access should be obtained.  It will be the responsibility of the bedside nurse to follow best practice to prevent extravasations.   

## 2023-05-23 NOTE — Progress Notes (Signed)
Repeat lactic at 0300 was 2.7. Elink and Md notified. Orders for stat repeat lactic redraw ordered for 0730AM.

## 2023-05-23 NOTE — Progress Notes (Signed)
  NEUROSURGERY PROGRESS NOTE   No issues overnight. Pt c/o HA this am.  EXAM:  BP (!) 142/63 (BP Location: Left Arm)   Pulse 62   Temp 98.1 F (36.7 C) (Oral)   Resp 18   Ht 5' 7.5" (1.715 m)   Wt 90.7 kg   LMP 03/26/2012   SpO2 100%   BMI 30.86 kg/m   Drowsy but easily arouses to voice, oriented  Speech fluent, appropriate  CN grossly intact  5/5 BUE/BLE   IMAGING: CTH last night and CTA personally reviewed with largely stable appearance of right frontal IPH and associated primarily right convexity SAH. CTA without intracranial aneurysm.  IMPRESSION:  62 y.o. female s/p fall after syncopal episode. Neurologic exam remains non-focal largely c/w severe concussion. Etiology of syncope remains unclear. EEG negative.  PLAN: - Cont neurologic observation - Finish 7d Keppra   Lisbeth Renshaw, MD [facility]

## 2023-05-24 ENCOUNTER — Inpatient Hospital Stay (HOSPITAL_COMMUNITY): Payer: 59

## 2023-05-24 ENCOUNTER — Other Ambulatory Visit: Payer: Self-pay

## 2023-05-24 DIAGNOSIS — I629 Nontraumatic intracranial hemorrhage, unspecified: Secondary | ICD-10-CM | POA: Diagnosis not present

## 2023-05-24 DIAGNOSIS — G936 Cerebral edema: Secondary | ICD-10-CM | POA: Diagnosis not present

## 2023-05-24 DIAGNOSIS — E876 Hypokalemia: Secondary | ICD-10-CM | POA: Diagnosis not present

## 2023-05-24 DIAGNOSIS — S06349A Traumatic hemorrhage of right cerebrum with loss of consciousness of unspecified duration, initial encounter: Secondary | ICD-10-CM

## 2023-05-24 DIAGNOSIS — R55 Syncope and collapse: Secondary | ICD-10-CM | POA: Diagnosis not present

## 2023-05-24 DIAGNOSIS — R7303 Prediabetes: Secondary | ICD-10-CM

## 2023-05-24 DIAGNOSIS — G9341 Metabolic encephalopathy: Secondary | ICD-10-CM | POA: Diagnosis not present

## 2023-05-24 DIAGNOSIS — I471 Supraventricular tachycardia, unspecified: Secondary | ICD-10-CM | POA: Diagnosis not present

## 2023-05-24 DIAGNOSIS — Z23 Encounter for immunization: Secondary | ICD-10-CM | POA: Diagnosis not present

## 2023-05-24 DIAGNOSIS — Z66 Do not resuscitate: Secondary | ICD-10-CM | POA: Diagnosis not present

## 2023-05-24 DIAGNOSIS — S061X9A Traumatic cerebral edema with loss of consciousness of unspecified duration, initial encounter: Secondary | ICD-10-CM | POA: Diagnosis not present

## 2023-05-24 DIAGNOSIS — I609 Nontraumatic subarachnoid hemorrhage, unspecified: Secondary | ICD-10-CM

## 2023-05-24 LAB — GLUCOSE, CAPILLARY
Glucose-Capillary: 79 mg/dL (ref 70–99)
Glucose-Capillary: 81 mg/dL (ref 70–99)
Glucose-Capillary: 104 mg/dL — ABNORMAL HIGH (ref 70–99)
Glucose-Capillary: 111 mg/dL — ABNORMAL HIGH (ref 70–99)

## 2023-05-24 LAB — BASIC METABOLIC PANEL
Anion gap: 12 (ref 5–15)
Anion gap: 9 (ref 5–15)
BUN: <5 mg/dL — ABNORMAL LOW (ref 8–23)
BUN: <5 mg/dL — ABNORMAL LOW (ref 8–23)
CO2: 21 mmol/L — ABNORMAL LOW (ref 22–32)
CO2: 20 mmol/L — ABNORMAL LOW (ref 22–32)
Calcium: 8.7 mg/dL — ABNORMAL LOW (ref 8.9–10.3)
Calcium: 8.5 mg/dL — ABNORMAL LOW (ref 8.9–10.3)
Chloride: 107 mmol/L (ref 98–111)
Chloride: 113 mmol/L — ABNORMAL HIGH (ref 98–111)
Creatinine, Ser: 0.5 mg/dL (ref 0.44–1.00)
Creatinine, Ser: 0.55 mg/dL (ref 0.44–1.00)
GFR, Estimated: >60 mL/min (ref >60)
GFR, Estimated: >60 mL/min (ref >60)
Glucose, Bld: 116 mg/dL — ABNORMAL HIGH (ref 70–99)
Glucose, Bld: 120 mg/dL — ABNORMAL HIGH (ref 70–99)
Potassium: 3.1 mmol/L — ABNORMAL LOW (ref 3.5–5.1)
Potassium: 3.7 mmol/L (ref 3.5–5.1)
Sodium: 140 mmol/L (ref 135–145)
Sodium: 142 mmol/L (ref 135–145)

## 2023-05-24 LAB — MAGNESIUM: Magnesium: 1.9 mg/dL (ref 1.7–2.4)

## 2023-05-24 LAB — SODIUM
Sodium: 145 mmol/L (ref 135–145)
Sodium: 144 mmol/L (ref 135–145)
Sodium: 147 mmol/L — ABNORMAL HIGH (ref 135–145)

## 2023-05-24 MED ORDER — ADENOSINE 6 MG/2ML IV SOLN: 12.0000 mg | Freq: Once | INTRAVENOUS | Status: AC

## 2023-05-24 MED ORDER — SODIUM CHLORIDE 0.9 % IV SOLN: INTRAVENOUS | Status: DC | PRN

## 2023-05-24 MED ORDER — MAGNESIUM SULFATE 2 GM/50ML IV SOLN
INTRAVENOUS | Status: AC
  Administered 2023-05-24: 2 g via INTRAVENOUS
  Filled 2023-05-24: qty 50

## 2023-05-24 MED ORDER — POTASSIUM CHLORIDE 10 MEQ/100ML IV SOLN
10.0000 meq | INTRAVENOUS | Status: AC
  Administered 2023-05-24 (×5): 10 meq via INTRAVENOUS
  Filled 2023-05-24 (×2): qty 100

## 2023-05-24 MED ORDER — MAGNESIUM SULFATE 2 GM/50ML IV SOLN: 2.0000 g | Freq: Once | INTRAVENOUS | Status: AC

## 2023-05-24 MED ORDER — MAGNESIUM SULFATE 2 GM/50ML IV SOLN
2.0000 g | Freq: Once | INTRAVENOUS | Status: AC
  Administered 2023-05-24: 2 g via INTRAVENOUS
  Filled 2023-05-24: qty 50

## 2023-05-24 MED ORDER — LEVETIRACETAM IN NACL 500 MG/100ML IV SOLN
500.0000 mg | Freq: Two times a day (BID) | INTRAVENOUS | Status: DC
  Administered 2023-05-24 – 2023-05-26 (×5): 500 mg via INTRAVENOUS
  Filled 2023-05-24 (×5): qty 100

## 2023-05-24 MED ORDER — AMIODARONE HCL IN DEXTROSE 360-4.14 MG/200ML-% IV SOLN
30.0000 mg/h | INTRAVENOUS | Status: DC
  Administered 2023-05-24 – 2023-05-25 (×2): 30 mg/h via INTRAVENOUS
  Filled 2023-05-24 (×2): qty 200

## 2023-05-24 MED ORDER — AMIODARONE HCL IN DEXTROSE 360-4.14 MG/200ML-% IV SOLN
60.0000 mg/h | INTRAVENOUS | Status: DC
  Administered 2023-05-24 (×2): 60 mg/h via INTRAVENOUS
  Filled 2023-05-24: qty 200

## 2023-05-24 MED ORDER — HYDRALAZINE HCL 20 MG/ML IJ SOLN
10.0000 mg | INTRAMUSCULAR | Status: DC | PRN
  Administered 2023-05-24 – 2023-05-25 (×3): 20 mg via INTRAVENOUS
  Administered 2023-05-28: 10 mg via INTRAVENOUS
  Administered 2023-05-30 – 2023-06-05 (×4): 20 mg via INTRAVENOUS
  Filled 2023-05-24 (×10): qty 1

## 2023-05-24 MED ORDER — ADENOSINE 6 MG/2ML IV SOLN
INTRAVENOUS | Status: AC
  Administered 2023-05-24: 12 mg via INTRAVENOUS
  Filled 2023-05-24: qty 2

## 2023-05-24 MED ORDER — POTASSIUM CHLORIDE 10 MEQ/100ML IV SOLN
10.0000 meq | INTRAVENOUS | Status: DC
  Administered 2023-05-24: 10 meq via INTRAVENOUS
  Filled 2023-05-24 (×4): qty 100

## 2023-05-24 MED ORDER — AMIODARONE LOAD VIA INFUSION
150.0000 mg | Freq: Once | INTRAVENOUS | Status: AC
  Administered 2023-05-24: 150 mg via INTRAVENOUS
  Filled 2023-05-24: qty 83.34

## 2023-05-24 MED ORDER — SODIUM CHLORIDE 0.9% FLUSH
10.0000 mL | Freq: Two times a day (BID) | INTRAVENOUS | Status: DC
  Administered 2023-05-24 (×2): 20 mL
  Administered 2023-05-25: 10 mL
  Administered 2023-05-25: 20 mL
  Administered 2023-05-26 – 2023-05-28 (×5): 10 mL
  Administered 2023-05-29: 20 mL
  Administered 2023-05-29 – 2023-05-31 (×4): 10 mL
  Administered 2023-06-01 (×2): 20 mL
  Administered 2023-06-02 – 2023-06-04 (×6): 10 mL
  Administered 2023-06-05: 30 mL
  Administered 2023-06-05 – 2023-06-06 (×3): 10 mL
  Administered 2023-06-07: 20 mL

## 2023-05-24 MED ORDER — ADENOSINE 6 MG/2ML IV SOLN: 6.0000 mg | Freq: Once | INTRAVENOUS | Status: AC

## 2023-05-24 MED ORDER — ADENOSINE 6 MG/2ML IV SOLN
INTRAVENOUS | Status: AC
  Filled 2023-05-24: qty 2

## 2023-05-24 MED ORDER — POTASSIUM CHLORIDE 10 MEQ/100ML IV SOLN
10.0000 meq | INTRAVENOUS | Status: AC
  Administered 2023-05-24 (×4): 10 meq via INTRAVENOUS
  Filled 2023-05-24 (×3): qty 100

## 2023-05-24 MED ORDER — MAGNESIUM SULFATE 2 GM/50ML IV SOLN
INTRAVENOUS | Status: AC
  Filled 2023-05-24: qty 50

## 2023-05-24 MED ORDER — ADENOSINE 6 MG/2ML IV SOLN
12.0000 mg | Freq: Once | INTRAVENOUS | Status: AC
  Administered 2023-05-24: 12 mg via INTRAVENOUS

## 2023-05-24 MED ORDER — AMIODARONE HCL IN DEXTROSE 360-4.14 MG/200ML-% IV SOLN
INTRAVENOUS | Status: AC
  Filled 2023-05-24: qty 200

## 2023-05-24 MED ORDER — SODIUM CHLORIDE 0.9% FLUSH: 10.0000 mL | INTRAVENOUS | Status: DC | PRN

## 2023-05-24 MED ORDER — SODIUM CHLORIDE 3% (HYPERTONIC SALINE) BOLUS VIA INFUSION
50.0000 mL | Freq: Once | INTRAVENOUS | Status: AC
  Administered 2023-05-24: 50 mL via INTRAVENOUS
  Filled 2023-05-24: qty 50

## 2023-05-24 MED ORDER — ADENOSINE 6 MG/2ML IV SOLN
INTRAVENOUS | Status: AC
  Administered 2023-05-24: 6 mg via INTRAVENOUS
  Filled 2023-05-24: qty 4

## 2023-05-24 MED ORDER — AMIODARONE HCL IN DEXTROSE 360-4.14 MG/200ML-% IV SOLN
INTRAVENOUS | Status: AC
  Administered 2023-05-24: 150 mg via INTRAVENOUS
  Filled 2023-05-24: qty 200

## 2023-05-24 MED ORDER — POTASSIUM CHLORIDE CRYS ER 20 MEQ PO TBCR: 40.0000 meq | EXTENDED_RELEASE_TABLET | Freq: Once | ORAL | Status: DC

## 2023-05-24 MED ORDER — CALCIUM GLUCONATE-NACL 1-0.675 GM/50ML-% IV SOLN
1.0000 g | Freq: Once | INTRAVENOUS | Status: AC
  Administered 2023-05-24: 1000 mg via INTRAVENOUS
  Filled 2023-05-24: qty 50

## 2023-05-24 NOTE — Progress Notes (Addendum)
STROKE TEAM PROGRESS NOTE   INTERVAL HISTORY No family at bedside.  RN at bedside. Repeat CT last night shows increase of hemorrhagic contusion with worsening surrounding vasogenic edema and mass effect.  Patient was placed on hypertonic saline at 75 mL an hour.  Sodium this morning 142, goal 150-155.  Repeat CT head this morning is stable. On exam this morning patient is drowsier than yesterday, able to answer simple questions and interact, left side seems weaker than right side.  Vitals:   05/24/23 0900 05/24/23 1000 05/24/23 1100 05/24/23 1200  BP: (!) 153/67  (!) 145/76   Pulse: 77 64 69   Resp: (!) 31 (!) 25 (!) 27   Temp:    99.7 F (37.6 C)  TempSrc:    Axillary  SpO2: 100% 100% 100%   Weight:      Height:       CBC:  Recent Labs  Lab 05/22/23 1102 05/22/23 1114 05/23/23 0215  WBC 6.1  --  9.3  HGB 12.8 13.9 12.9  HCT 40.5 41.0 40.6  MCV 77.4*  --  76.9*  PLT 226  --  198    Basic Metabolic Panel:  Recent Labs  Lab 05/23/23 0215 05/24/23 0242 05/24/23 0827 05/24/23 1122  NA 136 140 142 145  K 3.4* 3.1* 3.7  --   CL 97* 107 113*  --   CO2 20* 21* 20*  --   GLUCOSE 137* 116* 120*  --   BUN 5* <5* <5*  --   CREATININE 0.74 0.50 0.55  --   CALCIUM 8.9 8.7* 8.5*  --   MG 2.1 1.9  --   --    Lipid Panel:  Recent Labs  Lab 05/23/23 0215  CHOL 197  TRIG 50  HDL 81  CHOLHDL 2.4  VLDL 10  LDLCALC 161*    HgbA1c:  Recent Labs  Lab 05/22/23 1805  HGBA1C 5.7*    Urine Drug Screen:  Recent Labs  Lab 05/22/23 1103  LABOPIA NONE DETECTED  COCAINSCRNUR NONE DETECTED  LABBENZ NONE DETECTED  AMPHETMU NONE DETECTED  THCU NONE DETECTED  LABBARB NONE DETECTED     Alcohol Level  Recent Labs  Lab 05/22/23 1101  ETH <10     IMAGING past 24 hours [place] EKG SITE RITE  Result Date: 05/24/2023 If Site Rite image not attached, placement could not be confirmed due to current cardiac rhythm.  CT HEAD WO CONTRAST ( )  Result Date:  05/24/2023 CLINICAL DATA:  62 year old female status post fall striking head. Intracranial hemorrhage. Subsequent encounter. EXAM: CT HEAD WITHOUT CONTRAST TECHNIQUE: Contiguous axial images were obtained from the base of the skull through the vertex without intravenous contrast. RADIATION DOSE REDUCTION: This exam was performed according to the departmental dose-optimization program which includes automated exposure control, adjustment of the mA and/or kV according to patient size and/or use of iterative reconstruction technique. COMPARISON:  Head CT 05/23/2023 and earlier. FINDINGS: Brain: Relatively large anterior right frontal lobe hemorrhagic contusion extending from the inferior through the middle frontal gyrus with confluent regional edema. Smaller left inferior frontal gyrus hemorrhagic contusion and edema. Mild right anterior temporal lobe hemorrhagic contusion and edema. Mass effect on the anterolateral ventricles with leftward midline shift of 5-6 mm is stable. No IVH or ventriculomegaly. Basilar cisterns are patent. Trace subarachnoid hemorrhage elsewhere along the right hemisphere. No convincing SDH. Thalamic gray-white differentiation appears normal today. Stable gray-white matter differentiation throughout the brain. No cortically based acute infarct identified. Vascular: Calcified  atherosclerosis at the skull base. Skull: No fracture identified. Sinuses/Orbits: Scattered paranasal sinus opacification and fluid levels, favor inflammatory. Tympanic cavities and mastoids remain clear. Other: Left posterior scalp skin staples. No new orbit or scalp soft tissue injury. IMPRESSION: 1. Stable right > left frontal and right temporal lobe hemorrhagic contusions with edema. Stable intracranial mass effect with leftward midline shift of 5-6 mm. Mild SAH. 2. No new intracranial abnormality. Electronically Signed   By: Odessa Fleming M.D.   On: 05/24/2023 11:02   [place] EKG SITE RITE  Result Date: 05/24/2023 If Site  Rite image not attached, placement could not be confirmed due to current cardiac rhythm.  CT HEAD WO CONTRAST ( )  Result Date: 05/24/2023 CLINICAL DATA:  Initial evaluation for mental status change, unknown cause. EXAM: CT HEAD WITHOUT CONTRAST TECHNIQUE: Contiguous axial images were obtained from the base of the skull through the vertex without intravenous contrast. RADIATION DOSE REDUCTION: This exam was performed according to the departmental dose-optimization program which includes automated exposure control, adjustment of the mA and/or kV according to patient size and/or use of iterative reconstruction technique. COMPARISON:  Prior CT from 05/22/2023. FINDINGS: Brain: Continued interval evolution of posttraumatic hemorrhagic contusions involving the anterior right frontal lobe. Hyperdense hemorrhage is increased in size now measuring 5.5 x 3.2 x 3.3 cm. Surrounding vasogenic edema and regional mass effect has progressed and worsened. Associated right-to-left shift now measures up to 5 mm, progressed. An additional smaller hemorrhagic contusion at the anterior left frontal convexity measures up to 12 mm (series 4, image 8). Mild surrounding edema without significant regional mass effect. Scattered subarachnoid hemorrhage at the right frontal region has decreased from prior. Trace hemorrhage noted along the anterior falx. No new intracranial hemorrhage. No visible acute cortically based infarct. There is question of symmetric hypodensity involving the thalami bilaterally (series 4, image 12). While this appearance may be technical in nature, possible evolving ischemia or other process is difficult to exclude. No mass lesion. No hydrocephalus. No extra-axial fluid collection. Vascular: No visible asymmetric hyperdense vessel. Skull: Evolving left posterior scalp contusion/laceration. Underlying nondisplaced, nondepressed skull fracture again noted. No new finding. Sinuses/Orbits: Globes and orbital soft  tissues demonstrate no acute finding. Chronic right frontoethmoidal, sphenoid, and maxillary sinusitis again noted. Mastoid air cells remain clear. Other: None. IMPRESSION: 1. Increased size of hemorrhagic contusion involving the anterior right frontal lobe, now measuring 5.5 x 3.2 x 3.3 cm. Surrounding vasogenic edema and regional mass effect has progressed and worsened, with 5 mm of right-to-left shift now seen. 2. Additional smaller 12 mm hemorrhagic contusion at the anterior left frontal convexity. 3. Interval decrease in scattered small volume subarachnoid hemorrhage. 4. Question of symmetric hypodensity involving the thalami bilaterally. While this appearance may be technical in nature, possible evolving ischemia or other process is difficult to exclude. Correlation with dedicated MRI could be performed for further evaluation as warranted. 5. Evolving left posterior scalp contusion/laceration with underlying nondisplaced, nondepressed skull fracture. Electronically Signed   By: Rise Mu M.D.   On: 05/24/2023 02:39    PHYSICAL EXAM  Temp:  [97.7 F (36.5 C)-99.7 F (37.6 C)] 99.7 F (37.6 C) (06/13 1200) Pulse Rate:  [52-181] 69 (06/13 1100) Resp:  [11-32] 27 (06/13 1100) BP: (98-167)/(55-122) 145/76 (06/13 1100) SpO2:  [67 %-100 %] 100 % (06/13 1100) Arterial Line BP: (109-167)/(52-106) 167/59 (06/13 1100) Weight:  [90.6 kg] 90.6 kg (06/13 0500)  General - Well nourished, well developed, in no apparent distress  Mental Status -  Level of arousal and orientation to time, place, and person were intact. Language including expression, naming, repetition, comprehension was assessed and found intact. Attention span and concentration were normal, Recent and remote memory were intact.  Cranial Nerves II - XII - II - Visual field intact OU. III, IV, VI - Extraocular movements intact. V - Facial sensation intact bilaterally. VII - Facial movement intact bilaterally. VIII - Hearing  & vestibular intact bilaterally. X - Palate elevates symmetrically. XI - Chin turning & shoulder shrug intact bilaterally. XII - Tongue protrusion intact.  Motor Strength - The patient's strength was normal in all extremities and pronator drift was absent.  Bulk was normal and fasciculations were absent.   Motor Tone - Muscle tone was assessed at the neck and appendages and was normal. Sensory - Light touch, temperature/pinprick were assessed and were symmetrical.   Coordination - The patient had normal movements in the hands and feet with no ataxia or dysmetria.  Tremor was absent.  Gait and Station - deferred.   ASSESSMENT/PLAN Lindsay [name] is a 62 y.o. 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  She was brought to the ER and was found to have right frontal lobe hemorrhage with 4 mm shift with edema and some subarachnoid blood.  Neurosurgery consulted by ER physician.  Placed on Keppra 500 twice daily.  Routine EEG negative.  On exam today patient has no focal weakness or confusion.  Does endorse frontal headache, Fioricet added as needed as Tylenol given prior did not help.  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  Right frontal lobe hemorrhage with surrounding edema.  4 mm right to left midline shift.  Additional subdural and subarachnoid hemorrhages along the right frontal convexity. CTA head & neck: No LVO or significant stenosis in head or neck. Repeat CT head: Scattered posttraumatic subarachnoid hemorrhage with superimposed hemorrhagic contusions in the anterior right frontal convexity, slightly increased.  Mild mild mass effect with no more than trace right to left shift, stable. Evolving left posterior scalp contusion with laceration. Underlying acute  nondisplaced nondepressed occipital skull fracture Repeat CT 6/12 PM: Increase size of hemorrhagic contusion involving anterior right frontal lobe, now measuring 5 x 5 x 3 0.2 x 3.3 cm. Worsening surrounding vasogenic edema and regional mass effect, 5 mm right to left shift now seen. Repeat CT head 6/13 a.m: Stable hemorrhagic contusion with edema.  Stable mass effect with leftward midline shift of 5 to 6 mm.  Mild SAH. 2D Echo: EF 60 to 65%, no significant change from prior study. LDL 106 HgbA1c 5.7 Ammonia 15 VTE prophylaxis - SCDs No antithrombotic prior to admission, now on No antithrombotic due to ICH On keppra 500 bid for 7 days Therapy recommendations:  CIR Disposition:  pending, NSG on board, no intervention  ? Syncope  Recurrent SVT Per family, patient was in standing position, made a turn and fall to the ground. No clear mechanical fall Patient has following with Dr.Acharya cardiology (last seen 10/23) Hx of coronary artery vasospasm, Diagnosed with left heart cath in 2019, started on amlodipine 2D echo EF 60 to 65%. Cardiology consulted On amiodarone IV Recommend heart monitoring as outpatient  HA Cerebral edema Likely related to current ICH and skull fracture No history of migraine On 3% saline @ 75  Na 136->140->142->145 Na monitoring, goal 150-155  Hypertension Home meds:  none Stable SBP goal <160  Long-term BP goal normotensive  Hyperlipidemia Home meds:  none LDL 106, goal < 70 Consider to add statin at discharge  Diabetes type II, controlled Home meds:  [place] HgbA1c 5.7, goal < 7.0 CBGs SSI Outpatient PCP follow-up  Other Stroke Risk Factors Former Cigarette smoker Obesity, Body mass index is 30.82 kg/m., BMI >/= 30 associated with increased stroke risk, recommend weight loss, diet and exercise as appropriate   Other Active Problems Hx of Chronic Pain Gabapentin, Skelaxin, Xanaflex home meds  Hospital day # 2  Pt seen by Neuro NP/APP and  later by MD. Note/plan to be edited by MD as needed.    Lynnae January, DNP, AGACNP-BC [facility] Please use AMION for contact information & EPIC for messaging.  ATTENDING NOTE: I reviewed above note and agree with the assessment and plan. Pt was seen and examined.   RN at the bedside, pt drowsy sleepy, eyes open briefly with voice, however, not able to maintain eye opening, orientated to age, place, but not to time. Seems no aphasia, able to answer some questions with short answers, able to say "do not pinch me", following some simple commands but not all of them. Left gaze incomplete, PERRL. No facial droop. Tongue protrusion not cooperative. LUE drift fasted than RUE, LLE withdraw weaker than the right. When BLE put in knee flexion and foot on bed position, both drifting off but left more than right. Sensation, coordination and gait not tested.   Overnight pt seems more drowsy sleepy and repeat CT showed left frontal ICH getting bigger, she was put on 3% saline, continue BP management. Repeat CT this am is stable. Etiology for the ICH expansion not quite clear. Her BP was at goal before the mental status change. She also had recurrent SVT over night and today, cardiology on board, now on amiodarone drip. Na slowly trending up, on keppra, continue ICU care. Will follow  For detailed assessment and plan, please refer to above/below as I have made changes wherever appropriate.   Marvel Plan, MD PhD Stroke Neurology 05/24/2023 4:03 PM  This patient is critically ill due to ICH, ICH extension, skull fracture, SVT, cerebral edema and at significant risk of neurological worsening, death form brain herniation, heart failure, hematoma expansion. This patient's care requires constant monitoring of vital signs, hemodynamics, respiratory and cardiac monitoring, review of multiple databases, neurological assessment, discussion with family, other specialists and medical decision making of high  complexity. I spent 40 minutes of neurocritical care time in the care of this patient.   To contact Stroke Continuity provider, please refer to [email or web address]. After hours, contact General Neurology

## 2023-05-24 NOTE — Progress Notes (Signed)
Attempted to reach son via telephone to obtain PICC consent unanswered.  Bebe Shaggy, RN updated.  Will try again later.

## 2023-05-24 NOTE — Progress Notes (Signed)
Patient started on hypertonic %3 fluids with q6 sodium checks. Lab unable to stick patient x3. This RN paged NSGY NP Bergman in regards to obtaining labs. Verbal orders for a-line to be placed. Orders placed, RT notified.   Care ongoing.   Harriett Sine, RN

## 2023-05-24 NOTE — Consult Note (Signed)
Physical Medicine and Rehabilitation Consult Reason for Consult: Evaluate appropriateness for Inpatient Rehab Referring Physician: Dr. Karie Fetch    HPI: Lindsay [name] is a 62 y.o. female with PMHx of  has a past medical history of Allergic rhinitis, cause unspecified, Anginal pain (HCC) (2013), Arthritis, Diabetes mellitus without complication (HCC), Essential hypertension, benign (12/12/2011), Joint pain, Multiple drug allergies, Obesity, Wears glasses, and Wears partial dentures. . They were admitted to [facility] on 05/22/2023 status post fall with head strike, loss of consciousness, caused by syncopal episode while at work.  EMS evaluation significant for heart rate 160s, converted to sinus with 6 mg adenosine and 20 mg Cardizem.  Imaging significant for right frontal subdural hematoma and subarachnoid hemorrhage with cerebral edema and 4 mm midline shift, in addition to nondisplaced nondepressed occipital skull fracture.  Neurosurgery was consulted, she was placed on prophylactic Keppra, and admitted for nonoperative management with hypertonic saline for edema control.  Repeat CT 6-12 showed increased hemorrhagic contusion and worsening vasogenic edema and regional mass effect with 5 mm midline shift, subsequent scan 6-13 stable.  EEG was negative, cardiology consulted for syncopal workup with consideration of heart monitoring as outpatient.  Clinical course was complicated by recurrent SVT on 6/13 (currently being managed with intermittent adenosine, magnesium, amio bolus), hypokalemia, hypomagnesemia, urinary retention, hypertension with as needed Cleviprex goal less than 160, and incidental finding of adrenal nodules/possible adenomas.    PM&R was consulted to evaluate appropriateness for IPR admission. On evaluation, patient confused, restless, and restrained in hospital bed with son/daughter at bedside. They endorse patient lives alone in a 1 story apartment without any support,  currently no alternative living arrangements with family or friends being offered. She was completely independent prior to admission. They endorse she has been working with therapies since admission, was up to bedside chair yesterday and ambulating in the room with assistance. She is more confused and tired today; they endorse poor sleep and patient getting days/nights flipped, some hallucinations today, thinking she is at home.   Unable to obtain ROS due to patient cognitive status.  +Headache, continuouus  Past Medical History:  Diagnosis Date   Allergic rhinitis, cause unspecified    seasonal   Anginal pain (HCC) 2013   Stress   Arthritis    hips, thumb,knees   Diabetes mellitus without complication (HCC)    Essential hypertension, benign 12/12/2011   Joint pain    Multiple drug allergies    Obesity    Wears glasses    Wears partial dentures    upper   Past Surgical History:  Procedure Laterality Date   COLONOSCOPY  12/11/2006   [facility]   LEFT HEART CATH AND CORONARY ANGIOGRAPHY N/A 11/11/2018   Procedure: LEFT HEART CATH AND CORONARY ANGIOGRAPHY;  Surgeon: Yvonne Kendall, MD;  Location: [facility];  Service: Cardiovascular;  Laterality: N/A;   left knee surgery  1990   arthroscopy   ROTATOR CUFF REPAIR  09/1990   right shoulder   tonsils removed     as a child   TOTAL HIP ARTHROPLASTY Left 01/17/2022   Procedure: LEFT TOTAL HIP ARTHROPLASTY ANTERIOR APPROACH;  Surgeon: Marcene Corning, MD;  Location: [facility];  Service: Orthopedics;  Laterality: Left;   TOTAL HIP ARTHROPLASTY Right 03/28/2022   Procedure: RIGH TOTAL HIP ARTHROPLASTY ANTERIOR APPROACH;  Surgeon: Marcene Corning, MD;  Location: [facility];  Service: Orthopedics;  Laterality: Right;   TUBAL LIGATION  02/08/1989   WISDOM  TOOTH EXTRACTION     age 52s   Family History  Problem Relation Age of Onset   Heart disease Father 77       MI   Hypertension Father    Cancer Mother 15       colon   Asthma  Maternal Aunt    Diabetes Neg Hx    Stroke Neg Hx    Social History:  reports that she quit smoking about 6 years ago. Her smoking use included cigarettes. She has a 6.00 pack-year smoking history. She has never used smokeless tobacco. She reports current alcohol use. She reports that she does not use drugs. Allergies:  Allergies  Allergen Reactions   Latex Anaphylaxis    Depending on how long pt is touched with latex items   Penicillins Anaphylaxis   Prednisone Swelling   Sulfa Antibiotics Hives   Ceftin [Cefuroxime Axetil] Rash    Tolerates Keflex   Doxycycline Rash   Other Rash    ALL MYCINS   Medications Prior to Admission  Medication Sig Dispense Refill   augmented betamethasone dipropionate (DIPROLENE-AF) 0.05 % cream Apply 1 Application topically 2 (two) times daily.     furosemide (LASIX) 40 MG tablet Take 80 mg by mouth 2 (two) times daily.     gabapentin (NEURONTIN) 300 MG capsule Take 300-600 mg by mouth 4 (four) times daily as needed (pain/spasms).     nitroGLYCERIN (NITROSTAT) 0.4 MG SL tablet PLACE 1 TABLET UNDER THE TONGUE EVERY 5 MINUTES AS NEEDED FOR CHEST PAIN. (Patient taking differently: Place 0.4 mg under the tongue every 5 (five) minutes as needed for chest pain.) 25 tablet 3   potassium chloride SA (KLOR-CON) 20 MEQ tablet Take 20 mEq by mouth daily.     tirzepatide (MOUNJARO) 12.5 MG/0.5ML Pen Inject 12.5 mg into the skin once a week.     tiZANidine (ZANAFLEX) 4 MG tablet Take 4 mg by mouth every 6 (six) hours as needed for muscle spasms.     albuterol (VENTOLIN HFA) 108 (90 Base) MCG/ACT inhaler 2 puffs as needed Inhalation every 4 hrs for 30 days     Azelastine HCl 0.15 % SOLN Place 1 spray into the nose in the morning and at bedtime.     famotidine (PEPCID) 40 MG tablet Take 40 mg by mouth daily as needed for heartburn or indigestion.     isosorbide mononitrate (IMDUR) 30 MG 24 hr tablet Take 30 mg by mouth at bedtime.     lidocaine (LIDODERM) 5 % Place 1  patch onto the skin every 12 (twelve) hours.     metaxalone (SKELAXIN) 800 MG tablet Take 800 mg by mouth in the morning and at bedtime.     Multiple Vitamin (MULTIVITAMIN WITH MINERALS) TABS tablet Take 1 tablet by mouth in the morning.     tirzepatide [facility]) 10 MG/0.5ML Pen Inject 10 mg into the skin every Monday. (Patient not taking: Reported on 05/23/2023)     topiramate (TOPAMAX) 100 MG tablet Take 100 mg by mouth 2 (two) times daily.     Vitamin D, Ergocalciferol, (DRISDOL) 1.25 MG (50000 UNIT) CAPS capsule Take 50,000 Units by mouth once a week.      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 ADLs Comments: Mod I-independent level Functional Status:  Mobility: Bed Mobility Overal bed mobility: Needs Assistance Bed Mobility: Supine to Sit Supine to sit: HOB elevated, Mod assist Sit to supine: Min assist General bed mobility comments: assist for initiation and follow through as pt lethargic Transfers Overall transfer level: Needs assistance Equipment used: Rolling walker (2 wheels) Transfers: Sit to/from Stand Sit to Stand: Min assist Bed to/from chair/wheelchair/BSC transfer type:: Step pivot Step pivot transfers: Min guard General transfer comment: stepping to recliner with A for lines mainly and minguard for follow through Ambulation/Gait General Gait Details: deferred due to lethargy    ADL: ADL Overall ADL's :  Needs assistance/impaired Eating/Feeding: Set up, Bed level Grooming: Oral care, Supervision/safety, Standing Upper Body Bathing: Set up, Sitting Lower Body Bathing: Minimal assistance, Sit to/from stand Upper Body Dressing : Minimal assistance, Sitting Lower Body Dressing: Sit to/from stand, Min guard Lower Body Dressing Details (indicate cue type and reason): able to doff socks in bed. Donned socks while seated on bed. Toilet Transfer: Minimal assistance, Regular Toilet, Ambulation, Grab bars, Cueing for safety Toileting- Clothing Manipulation and Hygiene: Min guard, Sitting/lateral lean, Sit to/from stand Functional mobility during ADLs: Minimal assistance, Cueing for safety  Cognition: Cognition Overall Cognitive Status: Impaired/Different from baseline Orientation Level: Oriented to person, Oriented to place, Oriented to situation, Disoriented to time [employer] Scales of Cognitive Functioning: Confused, Appropriate Cognition Arousal/Alertness: Lethargic Behavior During Therapy: Flat affect, Impulsive Overall Cognitive Status: Impaired/Different from baseline Area of Impairment: Orientation, Attention, Memory, Following commands, Safety/judgement, Awareness, Problem solving, Rancho level Orientation Level: Disoriented to, Situation, Time, Place Current Attention Level: Focused Memory: Decreased short-term memory Following Commands: Follows one step commands with increased time, Follows one step commands inconsistently Safety/Judgement: Decreased awareness of safety, Decreased awareness of deficits Problem Solving: Slow processing, Decreased initiation, Difficulty sequencing, Requires verbal cues General Comments: needing assist for arousal throughout session, assisted to order dinner and lunch as she would drift off and not respond to questions from hospitality associate to order food  Blood pressure (!) 145/76, pulse 69, temperature 99.7 F (37.6 C), temperature source  Axillary, resp. rate (!) 27, height 5' 7.5" (1.715 m), weight 90.6 kg, last menstrual period 03/26/2012, SpO2 100 %. Physical Exam  Constitutional: Restless, without distress. Appropriate appearance for age.  HENT: No JVD. Neck Supple. Trachea midline. Membranes dry, cracked; tongue erythematous.  Eyes: PERRLA. EOMI. Visual fields grossly intact. + mild L hemineglect  Cardiovascular: RRR, no murmurs/rub/gallops. No Edema. Peripheral pulses 2+  Respiratory: CTAB. No rales, rhonchi, or wheezing. On RA.  Abdomen: + bowel sounds, normoactive. No distention or tenderness.  GU: Not examined.  +Purwick, draining clear urine.  Skin:  + RUE IV with moderate erythema, edema + LUE arterial line with foam padding + posterior scalp staples  MSK:      No apparent deformity. BL UE restraints, soft cuffs      Strength:  5/5 throughout RUE, RLE  4/5 throughout LUE, LLE  Neurologic exam:  Cognition: AAO to person, place, time and event as "fall". Rancho score IV-V.  Language: Fluent, No substitutions or neoglisms. No dysarthria. Memory: Poor short term memory, unable to assess long-term due to altered consciousness Insight: Poor insight into current condition.  Mood: Pleasant affect, appropriate mood.  Sensation: To light touch intact  in BL UEs and LEs  Reflexes: 2+ in BL UE , 3+ L patella. + L babinski, negative hoffman's CN: + Mild L facial droop Coordination: BL UE intention tremors Spasticity: MAS 0 in all extremities.     Results for orders placed or performed during the hospital encounter of 05/22/23 (from the past 24 hour(s))  Glucose, capillary     Status: Abnormal   Collection Time: 05/23/23  5:13 PM  Result Value Ref Range   Glucose-Capillary 143 (H) 70 - 99 mg/dL  Glucose, capillary     Status: Abnormal   Collection Time: 05/23/23  9:02 PM  Result Value Ref Range   Glucose-Capillary 160 (H) 70 - 99 mg/dL  Basic metabolic panel     Status: Abnormal   Collection Time: 05/24/23   2:42 AM  Result Value Ref Range   Sodium 140 135 - 145 mmol/L   Potassium 3.1 (L) 3.5 - 5.1 mmol/L   Chloride 107 98 - 111 mmol/L   CO2 21 (L) 22 - 32 mmol/L   Glucose, Bld 116 (H) 70 - 99 mg/dL   BUN <5 (L) 8 - 23 mg/dL   Creatinine, Ser 2.13 0.44 - 1.00 mg/dL   Calcium 8.7 (L) 8.9 - 10.3 mg/dL   GFR, Estimated >08 >65 mL/min   Anion gap 12 5 - 15  Magnesium     Status: None   Collection Time: 05/24/23  2:42 AM  Result Value Ref Range   Magnesium 1.9 1.7 - 2.4 mg/dL  Glucose, capillary     Status: None   Collection Time: 05/24/23  8:02 AM  Result Value Ref Range   Glucose-Capillary 79 70 - 99 mg/dL  Basic metabolic panel     Status: Abnormal   Collection Time: 05/24/23  8:27 AM  Result Value Ref Range   Sodium 142 135 - 145 mmol/L   Potassium 3.7 3.5 - 5.1 mmol/L   Chloride 113 (H) 98 - 111 mmol/L   CO2 20 (L) 22 - 32 mmol/L   Glucose, Bld 120 (H) 70 - 99 mg/dL   BUN <5 (L) 8 - 23 mg/dL   Creatinine, Ser 7.84 0.44 - 1.00 mg/dL   Calcium 8.5 (L) 8.9 - 10.3 mg/dL   GFR, Estimated >69 >62 mL/min   Anion gap 9 5 - 15  Glucose, capillary     Status: None   Collection Time: 05/24/23 11:18 AM  Result Value Ref Range   Glucose-Capillary 81 70 - 99 mg/dL  Sodium     Status: None   Collection Time: 05/24/23 11:22 AM  Result Value Ref Range   Sodium 145 135 - 145 mmol/L   [place] EKG SITE RITE  Result Date: 05/24/2023 If Site Rite image not attached, placement could not be confirmed due to current cardiac rhythm.  CT HEAD WO CONTRAST ( )  Result Date: 05/24/2023 CLINICAL DATA:  62 year old female status post fall striking head. Intracranial hemorrhage. Subsequent encounter. EXAM: CT HEAD WITHOUT CONTRAST TECHNIQUE: Contiguous axial images were obtained from the base of the skull through the vertex without intravenous contrast. RADIATION DOSE REDUCTION: This exam was performed according to the departmental dose-optimization program which includes automated exposure control,  adjustment of the mA and/or kV according to patient size and/or use of iterative reconstruction technique. COMPARISON:  Head CT 05/23/2023 and earlier. FINDINGS: Brain: Relatively large anterior right frontal lobe hemorrhagic contusion extending from the inferior through the middle frontal gyrus with confluent regional edema. Smaller left inferior frontal gyrus hemorrhagic contusion  and edema. Mild right anterior temporal lobe hemorrhagic contusion and edema. Mass effect on the anterolateral ventricles with leftward midline shift of 5-6 mm is stable. No IVH or ventriculomegaly. Basilar cisterns are patent. Trace subarachnoid hemorrhage elsewhere along the right hemisphere. No convincing SDH. Thalamic gray-white differentiation appears normal today. Stable gray-white matter differentiation throughout the brain. No cortically based acute infarct identified. Vascular: Calcified atherosclerosis at the skull base. Skull: No fracture identified. Sinuses/Orbits: Scattered paranasal sinus opacification and fluid levels, favor inflammatory. Tympanic cavities and mastoids remain clear. Other: Left posterior scalp skin staples. No new orbit or scalp soft tissue injury. IMPRESSION: 1. Stable right > left frontal and right temporal lobe hemorrhagic contusions with edema. Stable intracranial mass effect with leftward midline shift of 5-6 mm. Mild SAH. 2. No new intracranial abnormality. Electronically Signed   By: Odessa Fleming M.D.   On: 05/24/2023 11:02   [place] EKG SITE RITE  Result Date: 05/24/2023 If Site Rite image not attached, placement could not be confirmed due to current cardiac rhythm.  CT HEAD WO CONTRAST ( )  Result Date: 05/24/2023 CLINICAL DATA:  Initial evaluation for mental status change, unknown cause. EXAM: CT HEAD WITHOUT CONTRAST TECHNIQUE: Contiguous axial images were obtained from the base of the skull through the vertex without intravenous contrast. RADIATION DOSE REDUCTION: This exam was performed  according to the departmental dose-optimization program which includes automated exposure control, adjustment of the mA and/or kV according to patient size and/or use of iterative reconstruction technique. COMPARISON:  Prior CT from 05/22/2023. FINDINGS: Brain: Continued interval evolution of posttraumatic hemorrhagic contusions involving the anterior right frontal lobe. Hyperdense hemorrhage is increased in size now measuring 5.5 x 3.2 x 3.3 cm. Surrounding vasogenic edema and regional mass effect has progressed and worsened. Associated right-to-left shift now measures up to 5 mm, progressed. An additional smaller hemorrhagic contusion at the anterior left frontal convexity measures up to 12 mm (series 4, image 8). Mild surrounding edema without significant regional mass effect. Scattered subarachnoid hemorrhage at the right frontal region has decreased from prior. Trace hemorrhage noted along the anterior falx. No new intracranial hemorrhage. No visible acute cortically based infarct. There is question of symmetric hypodensity involving the thalami bilaterally (series 4, image 12). While this appearance may be technical in nature, possible evolving ischemia or other process is difficult to exclude. No mass lesion. No hydrocephalus. No extra-axial fluid collection. Vascular: No visible asymmetric hyperdense vessel. Skull: Evolving left posterior scalp contusion/laceration. Underlying nondisplaced, nondepressed skull fracture again noted. No new finding. Sinuses/Orbits: Globes and orbital soft tissues demonstrate no acute finding. Chronic right frontoethmoidal, sphenoid, and maxillary sinusitis again noted. Mastoid air cells remain clear. Other: None. IMPRESSION: 1. Increased size of hemorrhagic contusion involving the anterior right frontal lobe, now measuring 5.5 x 3.2 x 3.3 cm. Surrounding vasogenic edema and regional mass effect has progressed and worsened, with 5 mm of right-to-left shift now seen. 2.  Additional smaller 12 mm hemorrhagic contusion at the anterior left frontal convexity. 3. Interval decrease in scattered small volume subarachnoid hemorrhage. 4. Question of symmetric hypodensity involving the thalami bilaterally. While this appearance may be technical in nature, possible evolving ischemia or other process is difficult to exclude. Correlation with dedicated MRI could be performed for further evaluation as warranted. 5. Evolving left posterior scalp contusion/laceration with underlying nondisplaced, nondepressed skull fracture. Electronically Signed   By: Rise Mu M.D.   On: 05/24/2023 02:39   ECHOCARDIOGRAM COMPLETE  Result Date: 05/23/2023    ECHOCARDIOGRAM REPORT  Patient Name:   Lindsay [name] Date of Exam: 05/23/2023 Medical Rec #:  [id number]     Height:       67.5 in Accession #:    [id number]    Weight:       200.0 lb Date of Birth:  [birth year]     BSA:          2.033 m Patient Age:    62 years      BP:           142/63 mmHg Patient Gender: F             HR:           51 bpm. Exam Location:  Inpatient Procedure: 2D Echo, Cardiac Doppler and Color Doppler Indications:    Syncope  History:        Patient has prior history of Echocardiogram examinations, most                 recent 11/10/2018. Previous Myocardial Infarction and coronary                 vasospasm, Signs/Symptoms:ICH and Syncope; Risk                 Factors:Hypertension, Former Smoker and Diabetes.  Sonographer:    Wallie Char Referring Phys: [id number] Beulah Gandy PAYNE IMPRESSIONS  1. Left ventricular ejection fraction, by estimation, is 60 to 65%. The left ventricle has normal function. The left ventricle has no regional wall motion abnormalities. Left ventricular diastolic parameters were normal.  2. Right ventricular systolic function is normal. The right ventricular size is moderately enlarged. There is moderately elevated pulmonary artery systolic pressure. The estimated right ventricular systolic pressure is 48.9  mmHg.  3. The mitral valve is normal in structure. No evidence of mitral valve regurgitation. No evidence of mitral stenosis.  4. The aortic valve is tricuspid. Aortic valve regurgitation is not visualized. No aortic stenosis is present.  5. The inferior vena cava is dilated in size with <50% respiratory variability, suggesting right atrial pressure of 15 mmHg. Comparison(s): No significant change from prior study. Prior images reviewed side by side. FINDINGS  Left Ventricle: Left ventricular ejection fraction, by estimation, is 60 to 65%. The left ventricle has normal function. The left ventricle has no regional wall motion abnormalities. The left ventricular internal cavity size was normal in size. There is  no left ventricular hypertrophy. Left ventricular diastolic parameters were normal. Right Ventricle: The right ventricular size is moderately enlarged. No increase in right ventricular wall thickness. Right ventricular systolic function is normal. There is moderately elevated pulmonary artery systolic pressure. The tricuspid regurgitant  velocity is 2.91 m/s, and with an assumed right atrial pressure of 15 mmHg, the estimated right ventricular systolic pressure is 48.9 mmHg. Left Atrium: Left atrial size was normal in size. Right Atrium: Right atrial size was normal in size. Prominent Crista terminalis. Pericardium: There is no evidence of pericardial effusion. Mitral Valve: The mitral valve is normal in structure. No evidence of mitral valve regurgitation. No evidence of mitral valve stenosis. MV peak gradient, 5.2 mmHg. The mean mitral valve gradient is 2.0 mmHg. Tricuspid Valve: The tricuspid valve is normal in structure. Tricuspid valve regurgitation is mild . No evidence of tricuspid stenosis. Aortic Valve: The aortic valve is tricuspid. Aortic valve regurgitation is not visualized. No aortic stenosis is present. Aortic valve mean gradient measures 3.5 mmHg. Aortic valve peak gradient measures 6.9 mmHg.  Aortic valve area,  by VTI measures 3.00 cm. Pulmonic Valve: The pulmonic valve was normal in structure. Pulmonic valve regurgitation is not visualized. No evidence of pulmonic stenosis. Aorta: The aortic root and ascending aorta are structurally normal, with no evidence of dilitation. Venous: The inferior vena cava is dilated in size with less than 50% respiratory variability, suggesting right atrial pressure of 15 mmHg. IAS/Shunts: No atrial level shunt detected by color flow Doppler.  LEFT VENTRICLE PLAX 2D LVIDd:         4.60 cm     Diastology LVIDs:         3.20 cm     LV e' medial:    8.59 cm/s LV PW:         0.90 cm     LV E/e' medial:  11.3 LV IVS:        0.80 cm     LV e' lateral:   14.20 cm/s LVOT diam:     2.00 cm     LV E/e' lateral: 6.8 LV SV:         91 LV SV Index:   45 LVOT Area:     3.14 cm  LV Volumes (MOD) LV vol d, MOD A2C: 97.6 ml LV vol d, MOD A4C: 95.9 ml LV vol s, MOD A2C: 39.2 ml LV vol s, MOD A4C: 40.1 ml LV SV MOD A2C:     58.4 ml LV SV MOD A4C:     95.9 ml LV SV MOD BP:      60.4 ml RIGHT VENTRICLE             IVC RV Basal diam:  4.50 cm     IVC diam: 2.90 cm RV S prime:     15.00 cm/s TAPSE (M-mode): 2.8 cm LEFT ATRIUM             Index        RIGHT ATRIUM           Index LA diam:        3.90 cm 1.92 cm/m   RA Area:     19.90 cm LA Vol (A2C):   51.4 ml 25.28 ml/m  RA Volume:   61.00 ml  30.00 ml/m LA Vol (A4C):   60.8 ml 29.90 ml/m LA Biplane Vol: 57.2 ml 28.13 ml/m  AORTIC VALVE AV Area (Vmax):    3.03 cm AV Area (Vmean):   3.01 cm AV Area (VTI):     3.00 cm AV Vmax:           131.50 cm/s AV Vmean:          86.150 cm/s AV VTI:            0.304 m AV Peak Grad:      6.9 mmHg AV Mean Grad:      3.5 mmHg LVOT Vmax:         127.00 cm/s LVOT Vmean:        82.550 cm/s LVOT VTI:          0.290 m LVOT/AV VTI ratio: 0.96  AORTA Ao Root diam: 3.20 cm Ao Asc diam:  3.00 cm MITRAL VALVE               TRICUSPID VALVE MV Area (PHT): 3.77 cm    TR Peak grad:   33.9 mmHg MV Area VTI:   2.86  cm    TR Vmax:        291.00 cm/s MV Peak grad:  5.2  mmHg MV Mean grad:  2.0 mmHg    SHUNTS MV Vmax:       1.14 m/s    Systemic VTI:  0.29 m MV Vmean:      65.6 cm/s   Systemic Diam: 2.00 cm MV Decel Time: 201 msec MV E velocity: 96.70 cm/s MV A velocity: 59.50 cm/s MV E/A ratio:  1.63 Riley Lam MD Electronically signed by Riley Lam MD Signature Date/Time: 05/23/2023/11:21:06 AM    Final    EEG adult  Result Date: 05/23/2023 Charlsie Quest, MD     05/23/2023  8:40 AM Patient Name: Lindsay [name] MRN: [id number] Epilepsy Attending: Charlsie Quest Referring Physician/Provider: Harolyn Rutherford, MD Date:05/22/2023 Duration: 21.25 mins Patient history:  62 y.o. female presenting after syncopal episode of unclear etiology. Pt has non-focal neurologic exam with somewhat depressed level of consciousness and CT demonstrates traumatic pattern of right frontal hemorrhage although there is some SAH primarily in the right Sylvian fissure. EEG to evaluate for seizure. Level of alertness: Awake, drowsy AEDs during EEG study: LEV Technical aspects: This EEG study was done with scalp electrodes positioned according to the 10-20 International system of electrode placement. Electrical activity was reviewed with band Gielow filter of 1-70Hz , sensitivity of 7 uV/mm, display speed of 73mm/sec with a 60Hz  notched filter applied as appropriate. EEG data were recorded continuously and digitally stored.  Video monitoring was available and reviewed as appropriate. Description: The posterior dominant rhythm consists of 8-9 Hz activity of moderate voltage (25-35 uV) seen predominantly in posterior head regions, symmetric and reactive to eye opening and eye closing. Drowsiness was characterized by attenuation of the posterior background rhythm. Hyperventilation and photic stimulation were not performed.   IMPRESSION: This study is within normal limits. No seizures or epileptiform discharges were seen throughout the  recording. A normal interictal EEG does not exclude the diagnosis of epilepsy. [name]   CT HEAD WO CONTRAST ( )  Result Date: 05/22/2023 CLINICAL DATA:  Follow-up examination for hemorrhage. EXAM: CT HEAD WITHOUT CONTRAST TECHNIQUE: Contiguous axial images were obtained from the base of the skull through the vertex without intravenous contrast. RADIATION DOSE REDUCTION: This exam was performed according to the departmental dose-optimization program which includes automated exposure control, adjustment of the mA and/or kV according to patient size and/or use of iterative reconstruction technique. COMPARISON:  CT from earlier the same day. FINDINGS: Brain: Scattered posttraumatic intraparenchymal and subarachnoid hemorrhage at the anterior right frontal convexity, slightly increased in prominence from previous. Largest component now measures up to 3 cm in diameter. Mild mass effect with no more than trace right-to-left shift, stable. No other new intracranial hemorrhage. No acute large vessel territory infarct. No mass lesion or hydrocephalus. No significant extra-axial collection. Vascular: No abnormal hyperdense vessel. Skull: Evolving left posterior scalp contusion with laceration. Skin staples in place. There is an underlying acute nondisplaced, non this pressed occipital skull fracture (series 3, image 21). Inferior extension towards the right occipital condyle (series 3, image 14). Sinuses/Orbits: Globes orbital soft tissues within normal limits. Chronic right frontoethmoidal and maxillary sinusitis. Mastoid air cells and middle ear cavities are largely clear. Other: None. IMPRESSION: 1. Scattered posttraumatic subarachnoid hemorrhage with superimposed hemorrhagic contusions at the anterior right frontal convexity, slightly increased in prominence from previous. Largest component now measures up to 3 cm in diameter. Mild mass effect with no more than trace right-to-left shift, stable. 2. Evolving  left posterior scalp contusion with laceration. Underlying acute nondisplaced, nondepressed occipital skull fracture.  3. Chronic right frontoethmoidal and maxillary sinusitis. Electronically Signed   By: Rise Mu M.D.   On: 05/22/2023 23:15   CT ANGIO HEAD NECK W WO CM  Result Date: 05/22/2023 CLINICAL DATA:  Evaluate for aneurysm EXAM: CT ANGIOGRAPHY HEAD AND NECK WITH AND WITHOUT CONTRAST TECHNIQUE: Multidetector CT imaging of the head and neck was performed using the standard protocol during bolus administration of intravenous contrast. Multiplanar CT image reconstructions and MIPs were obtained to evaluate the vascular anatomy. Carotid stenosis measurements (when applicable) are obtained utilizing NASCET criteria, using the distal internal carotid diameter as the denominator. RADIATION DOSE REDUCTION: This exam was performed according to the departmental dose-optimization program which includes automated exposure control, adjustment of the mA and/or kV according to patient size and/or use of iterative reconstruction technique. CONTRAST:  75mL OMNIPAQUE IOHEXOL 350 MG/ML SOLN COMPARISON:  No prior CTA available, correlation is made with CT head and cervical spine 05/22/2023 FINDINGS: CT HEAD FINDINGS For noncontrast findings, please see same day CT head. CTA NECK FINDINGS Aortic arch: Two-vessel arch with a common origin of the brachiocephalic and left common carotid arteries. Imaged portion shows no evidence of aneurysm or dissection. No significant stenosis of the major arch vessel origins. Right carotid system: No evidence of stenosis, dissection, or occlusion. Left carotid system: No evidence of stenosis, dissection, or occlusion. Vertebral arteries: Codominant. No evidence of stenosis, dissection, or occlusion. Skeleton: No acute osseous abnormality. Degenerative changes in the cervical spine. Other neck: Negative. Upper chest: No focal pulmonary opacity or pleural effusion. Review of the MIP  images confirms the above findings CTA HEAD FINDINGS Anterior circulation: Both internal carotid arteries are patent to the termini, without significant stenosis. Patent left A1. Aplastic right A1. Normal anterior communicating artery. Anterior cerebral arteries are patent to their distal aspects without significant stenosis. No M1 stenosis or occlusion. MCA branches perfused to their distal aspects without significant stenosis. Posterior circulation: Vertebral arteries patent to the vertebrobasilar junction without significant stenosis. Basilar patent to its distal aspect without significant stenosis. Superior cerebellar arteries patent proximally. Patent P1 segments. PCAs perfused to their distal aspects without significant stenosis. The left posterior communicating artery is likely patent. Venous sinuses: As permitted by contrast timing, patent. Anatomic variants: Aplastic right A1. Review of the MIP images confirms the above findings No evidence of active extravasation in the right frontal lobe. IMPRESSION: 1. No intracranial large vessel occlusion or significant stenosis. 2. No hemodynamically significant stenosis in the neck. 3. No evidence of active extravasation in the right frontal lobe. Electronically Signed   By: Wiliam Ke M.D.   On: 05/22/2023 18:44   CT Angio Chest PE W and/or Wo Contrast  Result Date: 05/22/2023 CLINICAL DATA:  Syncope. EXAM: CT ANGIOGRAPHY CHEST WITH CONTRAST TECHNIQUE: Multidetector CT imaging of the chest was performed using the standard protocol during bolus administration of intravenous contrast. Multiplanar CT image reconstructions and MIPs were obtained to evaluate the vascular anatomy. RADIATION DOSE REDUCTION: This exam was performed according to the departmental dose-optimization program which includes automated exposure control, adjustment of the mA and/or kV according to patient size and/or use of iterative reconstruction technique. CONTRAST:  65mL OMNIPAQUE IOHEXOL  350 MG/ML SOLN COMPARISON:  Chest x-ray earlier 05/22/2023. Prior CT angiogram chest 11/12/2018 FINDINGS: Cardiovascular: No segmental or larger pulmonary embolism identified. Heart is nonenlarged. Trace pericardial fluid. The thoracic aorta has a normal course and caliber with mild atherosclerotic calcified plaque. There is a bovine type aortic arch, a normal variant. Coronary artery  calcifications are seen. Please correlate for other coronary risk factors. Mediastinum/Nodes: Large hiatal hernia identified. No specific abnormal lymph node enlargement identified in the axillary regions, hilum or mediastinum. Preserved thyroid gland. Lungs/Pleura: There is some linear opacity lung bases likely scar or atelectasis. No consolidation, pneumothorax or effusion. Upper Abdomen: Nodular thickening of both adrenal glands. These could be related to adenomas but are indeterminate on this contrast chest CT for pulmonary embolism. Recommend dedicated workup when appropriate. Musculoskeletal: Moderate diffuse degenerative changes along the spine. Streak artifact related to the patient's arms being scanned across the chest. Review of the MIP images confirms the above findings. IMPRESSION: No pulmonary embolism identified. Large hiatal hernia. Bilateral adrenal nodules. Possibly adenomas but indeterminate on this examination. Please correlate for any known history or dedicated workup when appropriate. Coronary artery calcifications. Please correlate for other coronary risk factors. Aortic Atherosclerosis (ICD10-I70.0). Electronically Signed   By: Karen Kays M.D.   On: 05/22/2023 16:15   CT Head Wo Contrast  Result Date: 05/22/2023 CLINICAL DATA:  Trauma EXAM: CT HEAD WITHOUT CONTRAST CT CERVICAL SPINE WITHOUT CONTRAST TECHNIQUE: Multidetector CT imaging of the head and cervical spine was performed following the standard protocol without intravenous contrast. Multiplanar CT image reconstructions of the cervical spine were also  generated. RADIATION DOSE REDUCTION: This exam was performed according to the departmental dose-optimization program which includes automated exposure control, adjustment of the mA and/or kV according to patient size and/or use of iterative reconstruction technique. COMPARISON:  None Available. FINDINGS: CT HEAD FINDINGS Brain: Hemorrhage in the anterior right frontal lobe, likely contusion, which is associated with surrounding hypodensity, likely edema. Additional subdural and subarachnoid hemorrhage along the right frontal convexity. The subdural hemorrhage measures up to 4 mm. 4 mm right-to-left midline shift. No evidence of acute infarct, mass, or hydrocephalus. The ventricles appear quite small, and the basilar cisterns appear effaced, concerning for more diffuse cerebral edema Vascular: No hyperdense vessel. Skull: Negative for fracture or focal lesion. Hematoma along the right parietal scalp. Small foci of air along the left occipital scalp with likely laceration. Small left suboccipital hematoma. Sinuses/Orbits: Complete opacification of right frontal sinus and right maxillary sinus. Near complete opacification of the left maxillary sinus. Air-fluid level in the left sphenoid sinus. Opacification of the anterior right ethmoid air cells. Dysconjugate gaze. Possible right proptosis. No evidence trauma in the orbits. Other: The mastoid air cells are well aerated. CT CERVICAL SPINE FINDINGS Alignment: No traumatic listhesis. Reversal of the normal cervical lordosis, with mild anterolisthesis of C4 on C5 and retrolisthesis of C5 on C6, which appears facet mediated. Osseous fusion across the C4-C5 facets and posterior disc space. Skull base and vertebrae: No acute fracture or suspicious osseous lesion. Soft tissues and spinal canal: No prevertebral fluid or swelling. No visible canal hematoma. Disc levels: Degenerative changes in the cervical spine. No high-grade spinal canal stenosis. Upper chest: For findings in  the thorax, please see same day CT chest. IMPRESSION: 1. Hemorrhage in the anterior right frontal lobe, likely contusion, with surrounding edema. Additional subdural and subarachnoid hemorrhage along the right frontal convexity, with 4 mm right-to-left midline shift. 2. The ventricles appear quite small, and the basilar cisterns appear effaced, concerning for more diffuse cerebral edema. 3. No acute fracture or traumatic listhesis in the cervical spine. These results were called by telephone at the time of interpretation on 05/22/2023 at 3:07 pm to provider Vivien Rossetti , who verbally acknowledged these results. Electronically Signed   By: Elaina Pattee.D.  On: 05/22/2023 15:08   CT Cervical Spine Wo Contrast  Result Date: 05/22/2023 CLINICAL DATA:  Trauma EXAM: CT HEAD WITHOUT CONTRAST CT CERVICAL SPINE WITHOUT CONTRAST TECHNIQUE: Multidetector CT imaging of the head and cervical spine was performed following the standard protocol without intravenous contrast. Multiplanar CT image reconstructions of the cervical spine were also generated. RADIATION DOSE REDUCTION: This exam was performed according to the departmental dose-optimization program which includes automated exposure control, adjustment of the mA and/or kV according to patient size and/or use of iterative reconstruction technique. COMPARISON:  None Available. FINDINGS: CT HEAD FINDINGS Brain: Hemorrhage in the anterior right frontal lobe, likely contusion, which is associated with surrounding hypodensity, likely edema. Additional subdural and subarachnoid hemorrhage along the right frontal convexity. The subdural hemorrhage measures up to 4 mm. 4 mm right-to-left midline shift. No evidence of acute infarct, mass, or hydrocephalus. The ventricles appear quite small, and the basilar cisterns appear effaced, concerning for more diffuse cerebral edema Vascular: No hyperdense vessel. Skull: Negative for fracture or focal lesion. Hematoma along the  right parietal scalp. Small foci of air along the left occipital scalp with likely laceration. Small left suboccipital hematoma. Sinuses/Orbits: Complete opacification of right frontal sinus and right maxillary sinus. Near complete opacification of the left maxillary sinus. Air-fluid level in the left sphenoid sinus. Opacification of the anterior right ethmoid air cells. Dysconjugate gaze. Possible right proptosis. No evidence trauma in the orbits. Other: The mastoid air cells are well aerated. CT CERVICAL SPINE FINDINGS Alignment: No traumatic listhesis. Reversal of the normal cervical lordosis, with mild anterolisthesis of C4 on C5 and retrolisthesis of C5 on C6, which appears facet mediated. Osseous fusion across the C4-C5 facets and posterior disc space. Skull base and vertebrae: No acute fracture or suspicious osseous lesion. Soft tissues and spinal canal: No prevertebral fluid or swelling. No visible canal hematoma. Disc levels: Degenerative changes in the cervical spine. No high-grade spinal canal stenosis. Upper chest: For findings in the thorax, please see same day CT chest. IMPRESSION: 1. Hemorrhage in the anterior right frontal lobe, likely contusion, with surrounding edema. Additional subdural and subarachnoid hemorrhage along the right frontal convexity, with 4 mm right-to-left midline shift. 2. The ventricles appear quite small, and the basilar cisterns appear effaced, concerning for more diffuse cerebral edema. 3. No acute fracture or traumatic listhesis in the cervical spine. These results were called by telephone at the time of interpretation on 05/22/2023 at 3:07 pm to provider Vivien Rossetti , who verbally acknowledged these results. Electronically Signed   By: Wiliam Ke M.D.   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 Yes Co-Morbidities requiring supervision/potential complications: Intermittent SVT with current titration of rate controlling agents, cerebral edema with hypernatremic goals, agitation/restlessness, bladder incontinence, hypertension, dysphagia and headache.  Due to bladder management, bowel management, safety, skin/wound care, disease management, medication administration, pain management, and patient education, does the patient require 24 hr/day rehab nursing? Yes Does the patient require coordinated care of a physician, rehab nurse, therapy disciplines of PT/OT/SLP to address physical and functional deficits in the context of the above medical diagnosis(es)? Yes Addressing deficits in the following areas: balance, endurance, locomotion, strength, transferring, bowel/bladder control, bathing, dressing, feeding, grooming, toileting, and cognition Can the patient  actively participate in an intensive therapy program of at least 3 hrs of therapy per day at least 5 days per week? Potentially The potential for patient to make measurable gains while on inpatient rehab is excellent Anticipated functional outcomes upon discharge from inpatient rehab are supervision  with PT, supervision with OT, modified independent with SLP. Estimated rehab length of stay to reach the above functional goals is: 10-14 days Anticipated discharge destination:  Unknown; family does not have ability to provide support in her home, not offering alternative assistance at this time, but working on a dispo plan Overall Rehab/Functional Prognosis: good  POST ACUTE RECOMMENDATIONS: This patient's condition is appropriate for continued rehabilitative care in the following setting: CIR Patient has agreed to participate in recommended program. Potentially Note that insurance prior authorization may be required for reimbursement for  recommended care.  Comment: Patient is not currently medically stable for transition to IPR, due to need for telemetry with recurrence of SVT this morning requiring multiple IV medications for management, as well as altered LOC and resumption of hypertonic saline.  Will need patient stable for at least 24 hours on p.o. regimen of antiarrhythmics and without need for hypertonic saline for successful transition to IPR. Additionally, anticipate patient will need some kind of assistance at least supervision level at discharge; family is working on a dispo plan but currently cannot think of a way to accommodate this. Once patient is medically stable and dispo plan with at least supervision level assistance determined, patient will be an excellent candidate for CIR.    MEDICAL RECOMMENDATIONS: Dietary assessment from SLP and initiation of diet PO or via cortrak as soon as medically feasible Recommend initiation of gabapentin 200 mg TID for ongoing headache and motor restlessness When transitioning to PO antiarrhythmics, would consider propranolol due to concurrent effects on BP, psychomotor agitation, and tremor Consider switching PRN Fioricet to scheduled Tylenol due to lethargy  Recommend delirium precautions given onset of day/night confusion and hallucinations Recommend initiation of melatonin 5 mg QHS and trazodone 50 mg QHS PRN for sleep to help with sleep/wake cycle regulation Recommend duplex US of RUE for suspected thrombophlebitis at IV given edema, erythema   I have personally performed a face to face diagnostic evaluation of this patient. Additionally, I have examined the patient's medical record including any pertinent labs and radiographic images. If the physician assistant has documented in this note, I have reviewed and edited or otherwise concur with the physician assistant's documentation.  Thanks,  Angelina Sheriff, DO 05/24/2023

## 2023-05-24 NOTE — Progress Notes (Signed)
  NEUROSURGERY PROGRESS NOTE   Had some drowsiness overnight, CTH was repeated. Unchanged HA.  EXAM:  BP (!) 145/76   Pulse 69   Temp 99.7 F (37.6 C) (Axillary)   Resp (!) 27   Ht 5' 7.5" (1.715 m)   Wt 90.6 kg   LMP 03/26/2012   SpO2 100%   BMI 30.82 kg/m   easily arouses to voice, oriented  Speech fluent CN grossly intact  5/5 BUE/BLE   IMAGING: CTH last night reviewed, demonstrates expected evolution of right frontal contusion. Mild R->L MLS. No HCP.  IMPRESSION:  62 y.o. female s/p fall after syncopal episode. Neurologic exam remains non-focal largely c/w severe concussion. Etiology of syncope being investigated. EEG negative.  PLAN: - Cont neurologic observation - Finish 7d Keppra   Lisbeth Renshaw, MD [facility]

## 2023-05-24 NOTE — Progress Notes (Addendum)
PCCM interval note  To bedside for eval mgmnt of SVT   SBPs 130-150  No distress    6mg  adenosine given without effect followed by 12mg , without desired effect. IV was assessed-- flushes and draws back but is sluggish. This is best PIV option at present.   Will order for 1x add'l 12mg  adenosine 2g mag Amio bolus and infusion if she doesn't convert after second 12mg  adenosine  1 g Calglu    About halfway thru amio bolus started to go in and out of SVT to ST to NSR back to SVT, before ultimately sustaining NSR. Remains HDS with SBP 130-140s   Cards consult placed earlier this morning   Will ask RN to reach back out to PICC team -- though the USPIV we used seems to be functioning (draws back, is sluggish to flush), I do have concern that it may be starting to go, making need for secure access more pressing.   CCT 37 min    Tessie Fass MSN, AGACNP-BC [facility] Pulmonary/Critical Care Medicine Amion for pager  05/24/2023, 1:11 PM

## 2023-05-24 NOTE — Progress Notes (Signed)
Attempted arterial line. Unsuccessful with CCM at bedside.

## 2023-05-24 NOTE — Progress Notes (Signed)
Peripherally Inserted Central Catheter Placement  The IV Nurse has discussed with the patient and/or persons authorized to consent for the patient, the purpose of this procedure and the potential benefits and risks involved with this procedure.  The benefits include less needle sticks, lab draws from the catheter, and the patient may be discharged home with the catheter. Risks include, but not limited to, infection, bleeding, blood clot (thrombus formation), and puncture of an artery; nerve damage and irregular heartbeat and possibility to perform a PICC exchange if needed/ordered by physician.  Alternatives to this procedure were also discussed.  Bard Power PICC patient education guide, fact sheet on infection prevention and patient information card has been provided to patient /or left at bedside.    PICC Placement Documentation  PICC Double Lumen 05/24/23 Right Basilic 38 cm 0 cm (Active)  Indication for Insertion or Continuance of Line Vasoactive infusions;Administration of hyperosmolar/irritating solutions (i.e. TPN, Vancomycin, etc.) 05/24/23 1348  Exposed Catheter (cm) 0 cm 05/24/23 1348  Site Assessment Clean, Dry, Intact 05/24/23 1348  Lumen #1 Status Flushed;Saline locked;Blood return noted 05/24/23 1348  Lumen #2 Status Flushed;Saline locked;Blood return noted 05/24/23 1348  Dressing Type Transparent;Securing device 05/24/23 1348  Dressing Status Antimicrobial disc in place;Clean, Dry, Intact 05/24/23 1348  Dressing Intervention New dressing;Other (Comment) 05/24/23 1348  Dressing Change Due 05/31/23 05/24/23 1348  Son signed consent at bedside   Maximino Greenland 05/24/2023, 1:51 PM

## 2023-05-24 NOTE — Procedures (Signed)
Arterial Catheter Insertion Procedure Note  Lindsay [name]  [id number]  Feb 09, 1961  Date:05/24/23  Time:2:30 AM    Provider Performing: Darcella Gasman Deavon Podgorski    Procedure: Insertion of Arterial Line (78295) with US guidance (62130)   Indication(s) Blood pressure monitoring and/or need for frequent ABGs  Consent Unable to obtain consent due to emergent nature of procedure.  Anesthesia None   Time Out Verified patient identification, verified procedure, site/side was marked, verified correct patient position, special equipment/implants available, medications/allergies/relevant history reviewed, required imaging and test results available.   Sterile Technique Maximal sterile technique including full sterile barrier drape, hand hygiene, sterile gown, sterile gloves, mask, hair covering, sterile ultrasound probe cover (if used).   Procedure Description Area of catheter insertion was cleaned with chlorhexidine and draped in sterile fashion. With real-time ultrasound guidance an arterial catheter was placed into the left radial artery.  Appropriate arterial tracings confirmed on monitor.     Complications/Tolerance None; patient tolerated the procedure well.   EBL Minimal   Specimen(s) None  Darcella Gasman Kiah Vanalstine, PA-C

## 2023-05-24 NOTE — Progress Notes (Addendum)
1610 -- This RN and Harriett Sine, RN, were in the room during arterial line insertion when the patient became tachycardic in the 160s, trending to the 190s with CCM Vernona Rieger Gleason, PA-C) at bedside. Verbal orders to push 10mg  of IV labetolol and 4 runs of K+ IVPB, with patient returning to 91 briefly after administration. See device data/eMAR from 0155 to 0205.  0203 -- Patient then began to have another witnessed conversion into SVT. Hildred Priest, NP, also contacted and advised to continue with administering PRN labetolol as needed, and she would follow up with cardiology.   0205 -- Patient returned to baseline, with questionable atrial fibrillation/flutter.   0210 -- Patient in NSR, stable BP, A-Line placed by Vernona Rieger Gleason, PA-C.  Wendi Snipes. Barnett Abu, RN 4N Neuro/Trauma ICU

## 2023-05-24 NOTE — Consult Note (Addendum)
Cardiology Consultation   Patient ID: Lindsay [name] MRN: [id number]; DOB: Nov 05, 1961  Admit date: 05/22/2023 Date of Consult: 05/24/2023  PCP:  Irena Reichmann, DO    [facility] Providers Cardiologist:  Parke Poisson, MD        Patient Profile:   Lindsay [name] is a 62 y.o. female with a hx of coronary artery vasospasm, hypertension, type 2 diabetes, gastroesophageal reflux disease who is being seen 05/24/2023 for the evaluation of syncope and SVT at the request of Dr. Chestine Spore.  History of Present Illness:   Lindsay [name] presented to the emergency department with EMS after a syncopal episode and subsequent fall while at work on 6/11.  Apparently while at work, patient had spontaneous syncope and fell backwards hitting her head.  Upon EMS arrival, patient found with tachycardia in the 160s, and she received Cardizem 20mg  IV with conversion back to sinus rhythm.  CT head as a part of trauma workup showed right frontal contusion with cerebral edema and brain compression with acute subdural hematoma and subarachnoid hemorrhage.  Given lack of seizure history, concern for cardiac etiology prompting request for cardiology consult from patient's critical care team.  Patient has since had repeat brain imaging now with midline shift and brain compression.  This afternoon before patient seen by cardiology, she had recurrent SVT with rates up to 208. She was given Adenosine 6mg , 12mg , 12mg  but continued with rapid rates. She then received Amiodarone and paroxysmally converted to sinus tachycardia and NSR before finally sustaining NSR. Patient without ability to contribute to HPI today, is confused and not fully oriented. Per patient's son and daughter in law who are at bedside, patient's work environment was reportedly unusually warm on the day of her syncope. Patient apparently turned around from a task and collapsed.   Patient with cardiac history notable for diagnosis of coronary artery  vasospasm.  Patient previously underwent left heart catheterization in 2019 and was found to have coronary artery vasospasm given perfusion response to intracoronary nitroglycerin.  Initiated on amlodipine for management of coronary vasospasm.  Patient was last seen by Dr. Jacques Navy in October 2023 and reported that she was feeling great.   Past Medical History:  Diagnosis Date   Allergic rhinitis, cause unspecified    seasonal   Anginal pain (HCC) 2013   Stress   Arthritis    hips, thumb,knees   Diabetes mellitus without complication (HCC)    Essential hypertension, benign 12/12/2011   Joint pain    Multiple drug allergies    Obesity    Wears glasses    Wears partial dentures    upper    Past Surgical History:  Procedure Laterality Date   COLONOSCOPY  12/11/2006   [facility]   LEFT HEART CATH AND CORONARY ANGIOGRAPHY N/A 11/11/2018   Procedure: LEFT HEART CATH AND CORONARY ANGIOGRAPHY;  Surgeon: Yvonne Kendall, MD;  Location: [facility];  Service: Cardiovascular;  Laterality: N/A;   left knee surgery  1990   arthroscopy   ROTATOR CUFF REPAIR  09/1990   right shoulder   tonsils removed     as a child   TOTAL HIP ARTHROPLASTY Left 01/17/2022   Procedure: LEFT TOTAL HIP ARTHROPLASTY ANTERIOR APPROACH;  Surgeon: Marcene Corning, MD;  Location: [facility];  Service: Orthopedics;  Laterality: Left;   TOTAL HIP ARTHROPLASTY Right 03/28/2022   Procedure: RIGH TOTAL HIP ARTHROPLASTY ANTERIOR APPROACH;  Surgeon: Marcene Corning, MD;  Location: [facility];  Service: Orthopedics;  Laterality:  Right;   TUBAL LIGATION  02/08/1989   WISDOM TOOTH EXTRACTION     age 42s     Home Medications:  Prior to Admission medications   Medication Sig Start Date End Date Taking? Authorizing Provider  augmented betamethasone dipropionate (DIPROLENE-AF) 0.05 % cream Apply 1 Application topically 2 (two) times daily. 05/16/23  Yes [provider]  furosemide (LASIX) 40 MG tablet Take 80 mg  by mouth 2 (two) times daily.   Yes [provider]  gabapentin (NEURONTIN) 300 MG capsule Take 300-600 mg by mouth 4 (four) times daily as needed (pain/spasms). 12/09/21  Yes [provider]  nitroGLYCERIN (NITROSTAT) 0.4 MG SL tablet PLACE 1 TABLET UNDER THE TONGUE EVERY 5 MINUTES AS NEEDED FOR CHEST PAIN. Patient taking differently: Place 0.4 mg under the tongue every 5 (five) minutes as needed for chest pain. 04/28/22  Yes Parke Poisson, MD  potassium chloride SA (KLOR-CON) 20 MEQ tablet Take 20 mEq by mouth daily.   Yes [provider]  tirzepatide Greggory Keen) 12.5 MG/0.5ML Pen Inject 12.5 mg into the skin once a week.   Yes [provider]  tiZANidine (ZANAFLEX) 4 MG tablet Take 4 mg by mouth every 6 (six) hours as needed for muscle spasms.   Yes [provider]  albuterol (VENTOLIN HFA) 108 (90 Base) MCG/ACT inhaler 2 puffs as needed Inhalation every 4 hrs for 30 days 03/27/23   [provider]  Azelastine HCl 0.15 % SOLN Place 1 spray into the nose in the morning and at bedtime.    [provider]  famotidine (PEPCID) 40 MG tablet Take 40 mg by mouth daily as needed for heartburn or indigestion.    [provider]  isosorbide mononitrate (IMDUR) 30 MG 24 hr tablet Take 30 mg by mouth at bedtime.    [provider]  lidocaine (LIDODERM) 5 % Place 1 patch onto the skin every 12 (twelve) hours. 10/15/21   [provider]  metaxalone (SKELAXIN) 800 MG tablet Take 800 mg by mouth in the morning and at bedtime.    [provider]  Multiple Vitamin (MULTIVITAMIN WITH MINERALS) TABS tablet Take 1 tablet by mouth in the morning.    [provider]  tirzepatide Greggory Keen) 10 MG/0.5ML Pen Inject 10 mg into the skin every Monday. Patient not taking: Reported on 05/23/2023    [provider]  topiramate (TOPAMAX) 100 MG tablet Take 100 mg by mouth 2 (two) times daily. 11/07/21   [provider]  Vitamin D, Ergocalciferol, (DRISDOL) 1.25 MG (50000 UNIT) CAPS capsule Take 50,000 Units by mouth once a week. 03/27/23   [provider]    Inpatient Medications: Scheduled Meds:  adenosine (ADENOCARD) IV  12 mg Intravenous Once   adenosine (ADENOCARD) IV  12 mg Intravenous Once   adenosine (ADENOCARD) IV  6 mg Intravenous Once   adenosine (ADENOCARD) IV  6 mg Intravenous Once   adenosine       adenosine       amiodarone  150 mg Intravenous Once   Chlorhexidine Gluconate Cloth  6 each Topical Daily   insulin aspart  0-6 Units Subcutaneous TID WC   lidocaine  1 patch Transdermal Q24H   pantoprazole (PROTONIX) IV  40 mg Intravenous QHS   tamsulosin  0.4 mg Oral Daily   Continuous Infusions:  sodium chloride     amiodarone     Followed by   amiodarone     amiodarone  clevidipine     levETIRAcetam 500 mg (05/24/23 0931)   magnesium sulfate     magnesium sulfate bolus IVPB     potassium chloride 10 mEq (05/24/23 1219)   sodium chloride (hypertonic) 75 mL/hr at 05/24/23 1213   PRN Meds: Place/Maintain arterial line **AND** sodium chloride, acetaminophen, adenosine, adenosine, amiodarone, butalbital-acetaminophen-caffeine, docusate sodium, hydrALAZINE, labetalol, magnesium sulfate, mouth rinse, polyethylene glycol  Allergies:    Allergies  Allergen Reactions   Latex Anaphylaxis    Depending on how long pt is touched with latex items   Penicillins Anaphylaxis   Prednisone Swelling   Sulfa Antibiotics Hives   Ceftin [Cefuroxime Axetil] Rash    Tolerates Keflex   Doxycycline Rash   Other Rash    ALL MYCINS    Social History:   Social History   Socioeconomic History   Marital status: Widowed    Spouse name: Not on file   Number of children: 2   Years of education: Not on file   Highest education level: Not on file  Occupational History   Occupation: Network engineer.  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   Social Determinants of Health   Financial Resource Strain: Not on file  Food Insecurity: Not on file  Transportation Needs: Not on file  Physical Activity: Not on file  Stress: Not on file  Social Connections: Not on file  Intimate Partner Violence: Not on file    Family History:    Family History  Problem Relation Age of Onset   Heart disease Father 59       MI   Hypertension Father    Cancer Mother 60       colon   Asthma Maternal Aunt    Diabetes Neg Hx    Stroke Neg Hx      ROS:  Please see the history of present illness.   All other ROS reviewed and negative.     Physical Exam/Data:   Vitals:   05/24/23 0900 05/24/23 1000 05/24/23 1100 05/24/23 1200  BP: (!) 153/67  (!) 145/76   Pulse: 77 64 69   Resp: (!) 31 (!) 25 (!) 27   Temp:    99.7 F (37.6 C)  TempSrc:    Axillary  SpO2: 100% 100% 100%   Weight:      Height:        Intake/Output Summary (Last 24 hours) at 05/24/2023 1258 Last data filed at 05/24/2023 0600 Gross per 24 hour  Intake 837.35 ml  Output 1850 ml  Net -1012.65 ml      05/24/2023    5:00 AM 05/23/2023    4:35 AM 05/22/2023   11:04 AM  Last 3 Weights  Weight (lbs) 199 lb 11.8 oz 199 lb 15.3 oz 200 lb  Weight (kg)  90.6 kg 90.7 kg 90.719 kg     Body mass index is 30.82 kg/m.  General:  Well nourished, well developed, in no acute distress. Acutely confused, seeing objects that were not there. HEENT: normal Neck: no  JVD Vascular: No carotid bruits; Distal pulses 2+ bilaterally Cardiac:  normal S1, S2; RRR; no murmur Lungs:  clear to auscultation bilaterally, no wheezing, rhonchi or rales  Abd: soft, nontender, no hepatomegaly  Ext: no significant edema Musculoskeletal:  No deformities, BUE and BLE strength normal and equal Skin: warm and dry  Neuro:  patient altered on exam. Able to answer some yes/no questions but also kept asking for a "green spray bottle" that was not present in the room. Does follow commands.  EKG:  The EKG was personally reviewed and demonstrates:  initial ECG with sinus rhythm and PACs. Subsequent tracing appears to show SVT with short RP, likely AVRT. Telemetry:  Telemetry was personally reviewed and demonstrates:  sinus rhythm with breakthrough SVT, rates up to 208 this afternoon.  Relevant CV Studies:  05/23/23 TTE  IMPRESSIONS     1. Left ventricular ejection fraction, by estimation, is 60 to 65%. The  left ventricle has normal function. The left ventricle has no regional  wall motion abnormalities. Left ventricular diastolic parameters were  normal.   2. Right ventricular systolic function is normal. The right ventricular  size is moderately enlarged. There is moderately elevated pulmonary artery  systolic pressure. The estimated right ventricular systolic pressure is  48.9 mmHg.   3. The mitral valve is normal in structure. No evidence of mitral valve  regurgitation. No evidence of mitral stenosis.   4. The aortic valve is tricuspid. Aortic valve regurgitation is not  visualized. No aortic stenosis is present.   5. The inferior vena cava is dilated in size with <50% respiratory  variability, suggesting right atrial pressure of 15 mmHg.   Comparison(s): No significant change from prior study. Prior images  reviewed side by side.   FINDINGS   Left Ventricle: Left ventricular ejection fraction, by estimation, is 60  to 65%. The left ventricle has normal function.  The left ventricle has no  regional wall motion abnormalities. The left ventricular internal cavity  size was normal in size. There is   no left ventricular hypertrophy. Left ventricular diastolic parameters  were normal.   Right Ventricle: The right ventricular size is moderately enlarged. No  increase in right ventricular wall thickness. Right ventricular systolic  function is normal. There is moderately elevated pulmonary artery systolic  pressure. The tricuspid regurgitant   velocity is 2.91 m/s, and with an assumed right atrial pressure of 15  mmHg, the estimated right ventricular systolic pressure is 48.9 mmHg.   Left Atrium: Left atrial size was normal in size.   Right Atrium: Right atrial size was normal in size. Prominent Crista  terminalis.   Pericardium: There is no evidence of pericardial effusion.   Mitral Valve: The mitral valve is normal in structure. No evidence of  mitral valve regurgitation. No evidence of mitral valve stenosis. MV peak  gradient, 5.2 mmHg. The mean mitral valve gradient is 2.0 mmHg.   Tricuspid Valve: The tricuspid valve is normal in structure. Tricuspid  valve regurgitation is mild . No evidence of tricuspid stenosis.   Aortic Valve: The aortic valve is tricuspid. Aortic valve regurgitation is  not visualized. No aortic stenosis is present. Aortic valve mean gradient  measures 3.5 mmHg. Aortic valve peak gradient measures 6.9 mmHg. Aortic  valve area, by VTI measures 3.00  cm.   Pulmonic Valve: The pulmonic valve was normal in structure. Pulmonic valve  regurgitation is not visualized. No evidence of pulmonic stenosis.   Aorta: The aortic root and ascending aorta are structurally normal, with  no evidence of dilitation.   Venous: The inferior vena cava is dilated in size with less than 50%  respiratory variability, suggesting right atrial pressure of 15 mmHg.   IAS/Shunts: No atrial level shunt detected by color flow Doppler.    Laboratory Data:  High Sensitivity Troponin:   Recent Labs  Lab 05/22/23 1102 05/22/23 1614  TROPONINIHS 9 9     Chemistry Recent Labs  Lab 05/22/23 1102 05/22/23 1114 05/23/23 0215 05/24/23 0242 05/24/23 0827 05/24/23 1122  NA 138   < > 136 140 142 145  K 3.1*   < > 3.4* 3.1* 3.7  --   CL 102   < > 97* 107 113*  --   CO2 22   < > 20* 21* 20*  --   GLUCOSE 177*   < > 137* 116* 120*  --   BUN 6*   < > 5* <5* <5*  --   CREATININE 0.77   < > 0.74 0.50 0.55  --   CALCIUM 8.6*   < > 8.9 8.7* 8.5*  --   MG 1.5*  --  2.1 1.9  --   --   GFRNONAA >60   < > >60 >60 >60  --   ANIONGAP 14   < > 19* 12 9  --    < > = values in this interval not displayed.    Recent Labs  Lab 05/22/23 1102  PROT 6.6  ALBUMIN 3.8  AST 20  ALT 12  ALKPHOS 64  BILITOT 1.0   Lipids  Recent Labs  Lab 05/23/23 0215  CHOL 197  TRIG 50  HDL 81  LDLCALC 106*  CHOLHDL 2.4    Hematology Recent Labs  Lab 05/22/23 1102 05/22/23 1114 05/23/23 0215  WBC 6.1  --  9.3  RBC 5.23*  --  5.28*  HGB 12.8 13.9 12.9  HCT 40.5 41.0 40.6  MCV 77.4*  --  76.9*  MCH 24.5*  --  24.4*  MCHC 31.6  --  31.8  RDW 20.4*  --  20.5*  PLT 226  --  198   Thyroid No results for input(s): "TSH", "FREET4" in the last 168 hours.  BNPNo results for input(s): "BNP", "PROBNP" in the last 168 hours.  DDimer No results for input(s): "DDIMER" in the last 168 hours.   Radiology/Studies:  [place] EKG SITE RITE  Result Date: 05/24/2023 If Site Rite image not attached, placement could not be confirmed due to current cardiac rhythm.  CT HEAD WO CONTRAST ( )  Result Date: 05/24/2023 CLINICAL DATA:  62 year old female status post fall striking head. Intracranial hemorrhage. Subsequent encounter. EXAM: CT HEAD WITHOUT CONTRAST TECHNIQUE: Contiguous axial images were obtained from the base of the skull through the vertex without intravenous contrast. RADIATION DOSE REDUCTION: This exam was performed according to the  departmental dose-optimization program which includes automated exposure control, adjustment of the mA and/or kV according to patient size and/or use of iterative reconstruction technique. COMPARISON:  Head CT 05/23/2023 and earlier. FINDINGS: Brain: Relatively large anterior right frontal lobe hemorrhagic contusion extending from the inferior through the middle frontal gyrus with confluent regional edema. Smaller left inferior frontal gyrus hemorrhagic contusion and edema. Mild right anterior temporal lobe  hemorrhagic contusion and edema. Mass effect on the anterolateral ventricles with leftward midline shift of 5-6 mm is stable. No IVH or ventriculomegaly. Basilar cisterns are patent. Trace subarachnoid hemorrhage elsewhere along the right hemisphere. No convincing SDH. Thalamic gray-white differentiation appears normal today. Stable gray-white matter differentiation throughout the brain. No cortically based acute infarct identified. Vascular: Calcified atherosclerosis at the skull base. Skull: No fracture identified. Sinuses/Orbits: Scattered paranasal sinus opacification and fluid levels, favor inflammatory. Tympanic cavities and mastoids remain clear. Other: Left posterior scalp skin staples. No new orbit or scalp soft tissue injury. IMPRESSION: 1. Stable right > left frontal and right temporal lobe hemorrhagic contusions with edema. Stable intracranial mass effect with leftward midline shift of 5-6 mm. Mild SAH. 2. No new intracranial abnormality. Electronically Signed   By: Odessa Fleming M.D.   On: 05/24/2023 11:02   [place] EKG SITE RITE  Result Date: 05/24/2023 If Site Rite image not attached, placement could not be confirmed due to current cardiac rhythm.  CT HEAD WO CONTRAST ( )  Result Date: 05/24/2023 CLINICAL DATA:  Initial evaluation for mental status change, unknown cause. EXAM: CT HEAD WITHOUT CONTRAST TECHNIQUE: Contiguous axial images were obtained from the base of the skull through the vertex  without intravenous contrast. RADIATION DOSE REDUCTION: This exam was performed according to the departmental dose-optimization program which includes automated exposure control, adjustment of the mA and/or kV according to patient size and/or use of iterative reconstruction technique. COMPARISON:  Prior CT from 05/22/2023. FINDINGS: Brain: Continued interval evolution of posttraumatic hemorrhagic contusions involving the anterior right frontal lobe. Hyperdense hemorrhage is increased in size now measuring 5.5 x 3.2 x 3.3 cm. Surrounding vasogenic edema and regional mass effect has progressed and worsened. Associated right-to-left shift now measures up to 5 mm, progressed. An additional smaller hemorrhagic contusion at the anterior left frontal convexity measures up to 12 mm (series 4, image 8). Mild surrounding edema without significant regional mass effect. Scattered subarachnoid hemorrhage at the right frontal region has decreased from prior. Trace hemorrhage noted along the anterior falx. No new intracranial hemorrhage. No visible acute cortically based infarct. There is question of symmetric hypodensity involving the thalami bilaterally (series 4, image 12). While this appearance may be technical in nature, possible evolving ischemia or other process is difficult to exclude. No mass lesion. No hydrocephalus. No extra-axial fluid collection. Vascular: No visible asymmetric hyperdense vessel. Skull: Evolving left posterior scalp contusion/laceration. Underlying nondisplaced, nondepressed skull fracture again noted. No new finding. Sinuses/Orbits: Globes and orbital soft tissues demonstrate no acute finding. Chronic right frontoethmoidal, sphenoid, and maxillary sinusitis again noted. Mastoid air cells remain clear. Other: None. IMPRESSION: 1. Increased size of hemorrhagic contusion involving the anterior right frontal lobe, now measuring 5.5 x 3.2 x 3.3 cm. Surrounding vasogenic edema and regional mass effect has  progressed and worsened, with 5 mm of right-to-left shift now seen. 2. Additional smaller 12 mm hemorrhagic contusion at the anterior left frontal convexity. 3. Interval decrease in scattered small volume subarachnoid hemorrhage. 4. Question of symmetric hypodensity involving the thalami bilaterally. While this appearance may be technical in nature, possible evolving ischemia or other process is difficult to exclude. Correlation with dedicated MRI could be performed for further evaluation as warranted. 5. Evolving left posterior scalp contusion/laceration with underlying nondisplaced, nondepressed skull fracture. Electronically Signed   By: Rise Mu M.D.   On: 05/24/2023 02:39   ECHOCARDIOGRAM COMPLETE  Result Date: 05/23/2023    ECHOCARDIOGRAM REPORT   Patient Name:   [facility]  H Luff Date of Exam: 05/23/2023 Medical Rec #:  [id number]     Height:       67.5 in Accession #:    [id number]    Weight:       200.0 lb Date of Birth:  11/23/1961     BSA:          2.033 m Patient Age:    62 years      BP:           142/63 mmHg Patient Gender: F             HR:           51 bpm. Exam Location:  Inpatient Procedure: 2D Echo, Cardiac Doppler and Color Doppler Indications:    Syncope  History:        Patient has prior history of Echocardiogram examinations, most                 recent 11/10/2018. Previous Myocardial Infarction and coronary                 vasospasm, Signs/Symptoms:ICH and Syncope; Risk                 Factors:Hypertension, Former Smoker and Diabetes.  Sonographer:    Wallie Char Referring Phys: [id number] Beulah Gandy PAYNE IMPRESSIONS  1. Left ventricular ejection fraction, by estimation, is 60 to 65%. The left ventricle has normal function. The left ventricle has no regional wall motion abnormalities. Left ventricular diastolic parameters were normal.  2. Right ventricular systolic function is normal. The right ventricular size is moderately enlarged. There is moderately elevated pulmonary artery  systolic pressure. The estimated right ventricular systolic pressure is 48.9 mmHg.  3. The mitral valve is normal in structure. No evidence of mitral valve regurgitation. No evidence of mitral stenosis.  4. The aortic valve is tricuspid. Aortic valve regurgitation is not visualized. No aortic stenosis is present.  5. The inferior vena cava is dilated in size with <50% respiratory variability, suggesting right atrial pressure of 15 mmHg. Comparison(s): No significant change from prior study. Prior images reviewed side by side. FINDINGS  Left Ventricle: Left ventricular ejection fraction, by estimation, is 60 to 65%. The left ventricle has normal function. The left ventricle has no regional wall motion abnormalities. The left ventricular internal cavity size was normal in size. There is  no left ventricular hypertrophy. Left ventricular diastolic parameters were normal. Right Ventricle: The right ventricular size is moderately enlarged. No increase in right ventricular wall thickness. Right ventricular systolic function is normal. There is moderately elevated pulmonary artery systolic pressure. The tricuspid regurgitant  velocity is 2.91 m/s, and with an assumed right atrial pressure of 15 mmHg, the estimated right ventricular systolic pressure is 48.9 mmHg. Left Atrium: Left atrial size was normal in size. Right Atrium: Right atrial size was normal in size. Prominent Crista terminalis. Pericardium: There is no evidence of pericardial effusion. Mitral Valve: The mitral valve is normal in structure. No evidence of mitral valve regurgitation. No evidence of mitral valve stenosis. MV peak gradient, 5.2 mmHg. The mean mitral valve gradient is 2.0 mmHg. Tricuspid Valve: The tricuspid valve is normal in structure. Tricuspid valve regurgitation is mild . No evidence of tricuspid stenosis. Aortic Valve: The aortic valve is tricuspid. Aortic valve regurgitation is not visualized. No aortic stenosis is present. Aortic valve  mean gradient measures 3.5 mmHg. Aortic valve peak gradient measures 6.9 mmHg. Aortic valve area, by VTI measures 3.00 cm.  Pulmonic Valve: The pulmonic valve was normal in structure. Pulmonic valve regurgitation is not visualized. No evidence of pulmonic stenosis. Aorta: The aortic root and ascending aorta are structurally normal, with no evidence of dilitation. Venous: The inferior vena cava is dilated in size with less than 50% respiratory variability, suggesting right atrial pressure of 15 mmHg. IAS/Shunts: No atrial level shunt detected by color flow Doppler.  LEFT VENTRICLE PLAX 2D LVIDd:         4.60 cm     Diastology LVIDs:         3.20 cm     LV e' medial:    8.59 cm/s LV PW:         0.90 cm     LV E/e' medial:  11.3 LV IVS:        0.80 cm     LV e' lateral:   14.20 cm/s LVOT diam:     2.00 cm     LV E/e' lateral: 6.8 LV SV:         91 LV SV Index:   45 LVOT Area:     3.14 cm  LV Volumes (MOD) LV vol d, MOD A2C: 97.6 ml LV vol d, MOD A4C: 95.9 ml LV vol s, MOD A2C: 39.2 ml LV vol s, MOD A4C: 40.1 ml LV SV MOD A2C:     58.4 ml LV SV MOD A4C:     95.9 ml LV SV MOD BP:      60.4 ml RIGHT VENTRICLE             IVC RV Basal diam:  4.50 cm     IVC diam: 2.90 cm RV S prime:     15.00 cm/s TAPSE (M-mode): 2.8 cm LEFT ATRIUM             Index        RIGHT ATRIUM           Index LA diam:        3.90 cm 1.92 cm/m   RA Area:     19.90 cm LA Vol (A2C):   51.4 ml 25.28 ml/m  RA Volume:   61.00 ml  30.00 ml/m LA Vol (A4C):   60.8 ml 29.90 ml/m LA Biplane Vol: 57.2 ml 28.13 ml/m  AORTIC VALVE AV Area (Vmax):    3.03 cm AV Area (Vmean):   3.01 cm AV Area (VTI):     3.00 cm AV Vmax:           131.50 cm/s AV Vmean:          86.150 cm/s AV VTI:            0.304 m AV Peak Grad:      6.9 mmHg AV Mean Grad:      3.5 mmHg LVOT Vmax:         127.00 cm/s LVOT Vmean:        82.550 cm/s LVOT VTI:          0.290 m LVOT/AV VTI ratio: 0.96  AORTA Ao Root diam: 3.20 cm Ao Asc diam:  3.00 cm MITRAL VALVE               TRICUSPID  VALVE MV Area (PHT): 3.77 cm    TR Peak grad:   33.9 mmHg MV Area VTI:   2.86 cm    TR Vmax:        291.00 cm/s MV Peak grad:  5.2 mmHg MV Mean grad:  2.0 mmHg    SHUNTS MV Vmax:       1.14 m/s    Systemic VTI:  0.29 m MV Vmean:      65.6 cm/s   Systemic Diam: 2.00 cm MV Decel Time: 201 msec MV E velocity: 96.70 cm/s MV A velocity: 59.50 cm/s MV E/A ratio:  1.63 Riley Lam MD Electronically signed by Riley Lam MD Signature Date/Time: 05/23/2023/11:21:06 AM    Final    EEG adult  Result Date: 05/23/2023 Charlsie Quest, MD     05/23/2023  8:40 AM Patient Name: NIKETA MYUNG MRN: [id number] Epilepsy Attending: Charlsie Quest Referring Physician/Provider: Harolyn Rutherford, MD Date:05/22/2023 Duration: 21.25 mins Patient history:  62 y.o. female presenting after syncopal episode of unclear etiology. Pt has non-focal neurologic exam with somewhat depressed level of consciousness and CT demonstrates traumatic pattern of right frontal hemorrhage although there is some SAH primarily in the right Sylvian fissure. EEG to evaluate for seizure. Level of alertness: Awake, drowsy AEDs during EEG study: LEV Technical aspects: This EEG study was done with scalp electrodes positioned according to the 10-20 International system of electrode placement. Electrical activity was reviewed with band Cousar filter of 1-70Hz , sensitivity of 7 uV/mm, display speed of 2mm/sec with a 60Hz  notched filter applied as appropriate. EEG data were recorded continuously and digitally stored.  Video monitoring was available and reviewed as appropriate. Description: The posterior dominant rhythm consists of 8-9 Hz activity of moderate voltage (25-35 uV) seen predominantly in posterior head regions, symmetric and reactive to eye opening and eye closing. Drowsiness was characterized by attenuation of the posterior background rhythm. Hyperventilation and photic stimulation were not performed.   IMPRESSION: This study is within  normal limits. No seizures or epileptiform discharges were seen throughout the recording. A normal interictal EEG does not exclude the diagnosis of epilepsy. [name]   CT HEAD WO CONTRAST ( )  Result Date: 05/22/2023 CLINICAL DATA:  Follow-up examination for hemorrhage. EXAM: CT HEAD WITHOUT CONTRAST TECHNIQUE: Contiguous axial images were obtained from the base of the skull through the vertex without intravenous contrast. RADIATION DOSE REDUCTION: This exam was performed according to the departmental dose-optimization program which includes automated exposure control, adjustment of the mA and/or kV according to patient size and/or use of iterative reconstruction technique. COMPARISON:  CT from earlier the same day. FINDINGS: Brain: Scattered posttraumatic intraparenchymal and subarachnoid hemorrhage at the anterior right frontal convexity, slightly increased in prominence from previous. Largest component now measures up to 3 cm in diameter. Mild mass effect with no more than trace right-to-left shift, stable. No other new intracranial hemorrhage. No acute large vessel territory infarct. No mass lesion or hydrocephalus. No significant extra-axial collection. Vascular: No abnormal hyperdense vessel. Skull: Evolving left posterior scalp contusion with laceration. Skin staples in place. There is an underlying acute nondisplaced, non this pressed occipital skull fracture (series 3, image 21). Inferior extension towards the right occipital condyle (series 3, image 14). Sinuses/Orbits: Globes orbital soft tissues within normal limits. Chronic right frontoethmoidal and maxillary sinusitis. Mastoid air cells and middle ear cavities are largely clear. Other: None. IMPRESSION: 1. Scattered posttraumatic subarachnoid hemorrhage with superimposed hemorrhagic contusions at the anterior right frontal convexity, slightly increased in prominence from previous. Largest component now measures up to 3 cm in diameter.  Mild mass effect with no more than trace right-to-left shift, stable. 2. Evolving left posterior scalp contusion with laceration. Underlying acute nondisplaced, nondepressed occipital skull fracture. 3. Chronic right frontoethmoidal and  maxillary sinusitis. Electronically Signed   By: Rise Mu M.D.   On: 05/22/2023 23:15   CT ANGIO HEAD NECK W WO CM  Result Date: 05/22/2023 CLINICAL DATA:  Evaluate for aneurysm EXAM: CT ANGIOGRAPHY HEAD AND NECK WITH AND WITHOUT CONTRAST TECHNIQUE: Multidetector CT imaging of the head and neck was performed using the standard protocol during bolus administration of intravenous contrast. Multiplanar CT image reconstructions and MIPs were obtained to evaluate the vascular anatomy. Carotid stenosis measurements (when applicable) are obtained utilizing NASCET criteria, using the distal internal carotid diameter as the denominator. RADIATION DOSE REDUCTION: This exam was performed according to the departmental dose-optimization program which includes automated exposure control, adjustment of the mA and/or kV according to patient size and/or use of iterative reconstruction technique. CONTRAST:  75mL OMNIPAQUE IOHEXOL 350 MG/ML SOLN COMPARISON:  No prior CTA available, correlation is made with CT head and cervical spine 05/22/2023 FINDINGS: CT HEAD FINDINGS For noncontrast findings, please see same day CT head. CTA NECK FINDINGS Aortic arch: Two-vessel arch with a common origin of the brachiocephalic and left common carotid arteries. Imaged portion shows no evidence of aneurysm or dissection. No significant stenosis of the major arch vessel origins. Right carotid system: No evidence of stenosis, dissection, or occlusion. Left carotid system: No evidence of stenosis, dissection, or occlusion. Vertebral arteries: Codominant. No evidence of stenosis, dissection, or occlusion. Skeleton: No acute osseous abnormality. Degenerative changes in the cervical spine. Other neck:  Negative. Upper chest: No focal pulmonary opacity or pleural effusion. Review of the MIP images confirms the above findings CTA HEAD FINDINGS Anterior circulation: Both internal carotid arteries are patent to the termini, without significant stenosis. Patent left A1. Aplastic right A1. Normal anterior communicating artery. Anterior cerebral arteries are patent to their distal aspects without significant stenosis. No M1 stenosis or occlusion. MCA branches perfused to their distal aspects without significant stenosis. Posterior circulation: Vertebral arteries patent to the vertebrobasilar junction without significant stenosis. Basilar patent to its distal aspect without significant stenosis. Superior cerebellar arteries patent proximally. Patent P1 segments. PCAs perfused to their distal aspects without significant stenosis. The left posterior communicating artery is likely patent. Venous sinuses: As permitted by contrast timing, patent. Anatomic variants: Aplastic right A1. Review of the MIP images confirms the above findings No evidence of active extravasation in the right frontal lobe. IMPRESSION: 1. No intracranial large vessel occlusion or significant stenosis. 2. No hemodynamically significant stenosis in the neck. 3. No evidence of active extravasation in the right frontal lobe. Electronically Signed   By: Wiliam Ke M.D.   On: 05/22/2023 18:44   CT Angio Chest PE W and/or Wo Contrast  Result Date: 05/22/2023 CLINICAL DATA:  Syncope. EXAM: CT ANGIOGRAPHY CHEST WITH CONTRAST TECHNIQUE: Multidetector CT imaging of the chest was performed using the standard protocol during bolus administration of intravenous contrast. Multiplanar CT image reconstructions and MIPs were obtained to evaluate the vascular anatomy. RADIATION DOSE REDUCTION: This exam was performed according to the departmental dose-optimization program which includes automated exposure control, adjustment of the mA and/or kV according to patient  size and/or use of iterative reconstruction technique. CONTRAST:  65mL OMNIPAQUE IOHEXOL 350 MG/ML SOLN COMPARISON:  Chest x-ray earlier 05/22/2023. Prior CT angiogram chest 11/12/2018 FINDINGS: Cardiovascular: No segmental or larger pulmonary embolism identified. Heart is nonenlarged. Trace pericardial fluid. The thoracic aorta has a normal course and caliber with mild atherosclerotic calcified plaque. There is a bovine type aortic arch, a normal variant. Coronary artery calcifications are seen. Please correlate  for other coronary risk factors. Mediastinum/Nodes: Large hiatal hernia identified. No specific abnormal lymph node enlargement identified in the axillary regions, hilum or mediastinum. Preserved thyroid gland. Lungs/Pleura: There is some linear opacity lung bases likely scar or atelectasis. No consolidation, pneumothorax or effusion. Upper Abdomen: Nodular thickening of both adrenal glands. These could be related to adenomas but are indeterminate on this contrast chest CT for pulmonary embolism. Recommend dedicated workup when appropriate. Musculoskeletal: Moderate diffuse degenerative changes along the spine. Streak artifact related to the patient's arms being scanned across the chest. Review of the MIP images confirms the above findings. IMPRESSION: No pulmonary embolism identified. Large hiatal hernia. Bilateral adrenal nodules. Possibly adenomas but indeterminate on this examination. Please correlate for any known history or dedicated workup when appropriate. Coronary artery calcifications. Please correlate for other coronary risk factors. Aortic Atherosclerosis (ICD10-I70.0). Electronically Signed   By: Karen Kays M.D.   On: 05/22/2023 16:15   CT Head Wo Contrast  Result Date: 05/22/2023 CLINICAL DATA:  Trauma EXAM: CT HEAD WITHOUT CONTRAST CT CERVICAL SPINE WITHOUT CONTRAST TECHNIQUE: Multidetector CT imaging of the head and cervical spine was performed following the standard protocol without  intravenous contrast. Multiplanar CT image reconstructions of the cervical spine were also generated. RADIATION DOSE REDUCTION: This exam was performed according to the departmental dose-optimization program which includes automated exposure control, adjustment of the mA and/or kV according to patient size and/or use of iterative reconstruction technique. COMPARISON:  None Available. FINDINGS: CT HEAD FINDINGS Brain: Hemorrhage in the anterior right frontal lobe, likely contusion, which is associated with surrounding hypodensity, likely edema. Additional subdural and subarachnoid hemorrhage along the right frontal convexity. The subdural hemorrhage measures up to 4 mm. 4 mm right-to-left midline shift. No evidence of acute infarct, mass, or hydrocephalus. The ventricles appear quite small, and the basilar cisterns appear effaced, concerning for more diffuse cerebral edema Vascular: No hyperdense vessel. Skull: Negative for fracture or focal lesion. Hematoma along the right parietal scalp. Small foci of air along the left occipital scalp with likely laceration. Small left suboccipital hematoma. Sinuses/Orbits: Complete opacification of right frontal sinus and right maxillary sinus. Near complete opacification of the left maxillary sinus. Air-fluid level in the left sphenoid sinus. Opacification of the anterior right ethmoid air cells. Dysconjugate gaze. Possible right proptosis. No evidence trauma in the orbits. Other: The mastoid air cells are well aerated. CT CERVICAL SPINE FINDINGS Alignment: No traumatic listhesis. Reversal of the normal cervical lordosis, with mild anterolisthesis of C4 on C5 and retrolisthesis of C5 on C6, which appears facet mediated. Osseous fusion across the C4-C5 facets and posterior disc space. Skull base and vertebrae: No acute fracture or suspicious osseous lesion. Soft tissues and spinal canal: No prevertebral fluid or swelling. No visible canal hematoma. Disc levels: Degenerative  changes in the cervical spine. No high-grade spinal canal stenosis. Upper chest: For findings in the thorax, please see same day CT chest. IMPRESSION: 1. Hemorrhage in the anterior right frontal lobe, likely contusion, with surrounding edema. Additional subdural and subarachnoid hemorrhage along the right frontal convexity, with 4 mm right-to-left midline shift. 2. The ventricles appear quite small, and the basilar cisterns appear effaced, concerning for more diffuse cerebral edema. 3. No acute fracture or traumatic listhesis in the cervical spine. These results were called by telephone at the time of interpretation on 05/22/2023 at 3:07 pm to provider Vivien Rossetti , who verbally acknowledged these results. Electronically Signed   By: Wiliam Ke M.D.   On: 05/22/2023 15:08  CT Cervical Spine Wo Contrast  Result Date: 05/22/2023 CLINICAL DATA:  Trauma EXAM: CT HEAD WITHOUT CONTRAST CT CERVICAL SPINE WITHOUT CONTRAST TECHNIQUE: Multidetector CT imaging of the head and cervical spine was performed following the standard protocol without intravenous contrast. Multiplanar CT image reconstructions of the cervical spine were also generated. RADIATION DOSE REDUCTION: This exam was performed according to the departmental dose-optimization program which includes automated exposure control, adjustment of the mA and/or kV according to patient size and/or use of iterative reconstruction technique. COMPARISON:  None Available. FINDINGS: CT HEAD FINDINGS Brain: Hemorrhage in the anterior right frontal lobe, likely contusion, which is associated with surrounding hypodensity, likely edema. Additional subdural and subarachnoid hemorrhage along the right frontal convexity. The subdural hemorrhage measures up to 4 mm. 4 mm right-to-left midline shift. No evidence of acute infarct, mass, or hydrocephalus. The ventricles appear quite small, and the basilar cisterns appear effaced, concerning for more diffuse cerebral edema  Vascular: No hyperdense vessel. Skull: Negative for fracture or focal lesion. Hematoma along the right parietal scalp. Small foci of air along the left occipital scalp with likely laceration. Small left suboccipital hematoma. Sinuses/Orbits: Complete opacification of right frontal sinus and right maxillary sinus. Near complete opacification of the left maxillary sinus. Air-fluid level in the left sphenoid sinus. Opacification of the anterior right ethmoid air cells. Dysconjugate gaze. Possible right proptosis. No evidence trauma in the orbits. Other: The mastoid air cells are well aerated. CT CERVICAL SPINE FINDINGS Alignment: No traumatic listhesis. Reversal of the normal cervical lordosis, with mild anterolisthesis of C4 on C5 and retrolisthesis of C5 on C6, which appears facet mediated. Osseous fusion across the C4-C5 facets and posterior disc space. Skull base and vertebrae: No acute fracture or suspicious osseous lesion. Soft tissues and spinal canal: No prevertebral fluid or swelling. No visible canal hematoma. Disc levels: Degenerative changes in the cervical spine. No high-grade spinal canal stenosis. Upper chest: For findings in the thorax, please see same day CT chest. IMPRESSION: 1. Hemorrhage in the anterior right frontal lobe, likely contusion, with surrounding edema. Additional subdural and subarachnoid hemorrhage along the right frontal convexity, with 4 mm right-to-left midline shift. 2. The ventricles appear quite small, and the basilar cisterns appear effaced, concerning for more diffuse cerebral edema. 3. No acute fracture or traumatic listhesis in the cervical spine. These results were called by telephone at the time of interpretation on 05/22/2023 at 3:07 pm to provider Vivien Rossetti , who verbally acknowledged these results. Electronically Signed   By: Wiliam Ke M.D.   On: 05/22/2023 15:08   DG Chest Portable 1 View  Result Date: 05/22/2023 CLINICAL DATA:  syncope EXAM: PORTABLE CHEST  1 VIEW COMPARISON:  Chest radiograph 01/10/2022 FINDINGS: No pleural effusion. No pneumothorax. Prominent cardiac contours. There are prominent bilateral perihilar and interstitial opacities which can be seen in the setting of pulmonary venous congestion or atypical infection. No radiographically apparent displaced rib fractures. Visualized upper abdomen is unremarkable. IMPRESSION: Prominent bilateral perihilar and interstitial opacities which can be seen in the setting of pulmonary venous congestion or atypical infection. Electronically Signed   By: Lorenza Cambridge M.D.   On: 05/22/2023 14:05     Assessment and Plan:   Supraventricular tachycardia  Patient admitted following a syncopal episode at work that resulted in head impact and resulting subdural hematoma and subarachnoid hemorrhage, now also with midline shift and brain compression. Initially appears that there was a question of atrial flutter vs SVT.   Patient with recurrent SVT  this afternoon, rates into the 200s that was unresponsive to adenosine pushes. Now with arrhythmia quiescence after received IV amiodarone bolus and now infusion.  TTE without significant abnormality. Some evidence of elevated right atrial pressure, though no significant volume overload noted on physical exam. Would closely monitor volume status with hypertonic saline. Although patient currently maintaining NSR with amiodarone, would favor use of atrioventricular nodal blocking agent for SVT prophylaxis once she has recovered from acute phase of injury. Reasonable to continue IV amiodarone at this time. Patient will need heart monitoring at discharge to better assess for recurrent arrhythmias that may have led to syncope vs alternative etiology. Maintain K>4, Mg>2.   Risk Assessment/Risk Scores:      For questions or updates, please contact [facility] Please consult [email or web address] for contact info under    Signed, Perlie Gold, PA-C  05/24/2023  12:58 PM  Patient seen and examined.  Agree with above documentation.  Ms. Mew is a 53 old female with history of coronary artery vasospasm, T2DM, hypertension, GERD who we are consulted for evaluation of SVT and syncope.  She presented to the ED on 05/22/2023 after syncopal episode.  Reportedly had sudden episode of syncope while at work, fell backwards and hit her head.  When EMS arrived she was tachycardic to 160s and was given IV diltiazem 20 mg and converted to sinus rhythm.  Head CT was done which showed acute subdural hematoma and subarachnoid hemorrhage.  She was admitted to neuro ICU.  Today she had episode of SVT with rates up up to 180s.  Adenosine was administered (6 mg then 12 mg and 12 mg) but remained in SVT.  Amiodarone bolus was administered and patient converted to sinus rhythm.  Most recent vital signs showed BP 145/76, SpO2 100% on room air, pulse 60s.  EKG from earlier today shows narrow complex regular tachycardia, rate 167, diffuse ST depressions, left axis deviation. On exam, patient is agitated, oriented to person only, regular rate and rhythm, no murmurs, lungs CTAB, no LE edema.  For her SVT, she converted to sinus rhythm with amiodarone bolus.  Will continue amiodarone drip for now.  Continue to monitor on telemetry.  Little Ishikawa, MD

## 2023-05-24 NOTE — Progress Notes (Signed)
Physical Therapy Treatment Patient Details Name: Lindsay [name] MRN: [id number] DOB: 04-14-1961 Today's Date: 05/24/2023   History of Present Illness 62 y.o. female brought to the hospital after she apparently passed out at work, striking the back of her head. CT demonstrates traumatic pattern of right frontal hemorrhage although there is some SAH primarily in the right Sylvian fissure.    PT Comments    Patient seen with nursing assist for multiple lines and due to cardiac events today.  VSS throughout with HR in 60's on amiodarone drip.  Cardiac MD in to consult during session.  Patient mobilizing several steps in the room with RW eager to get to the bathroom, though the more she tried she realized L LE weakness with buckling and hyperextension in stance.  Patient appropriate for inpatient rehab prior to d/c home.  PT will continue to follow.    Recommendations for follow up therapy are one component of a multi-disciplinary discharge planning process, led by the attending physician.  Recommendations may be updated based on patient status, additional functional criteria and insurance authorization.  Follow Up Recommendations       Assistance Recommended at Discharge Intermittent Supervision/Assistance  Patient can return home with the following Assistance with cooking/housework;Assist for transportation;Help with stairs or ramp for entrance;Direct supervision/assist for medications management;A lot of help with bathing/dressing/bathroom;A lot of help with walking and/or transfers   Equipment Recommendations  None recommended by PT    Recommendations for Other Services       Precautions / Restrictions Precautions Precautions: Fall     Mobility  Bed Mobility Overal bed mobility: Needs Assistance Bed Mobility: Supine to Sit, Sit to Supine     Supine to sit: HOB elevated, Mod assist Sit to supine: +2 for safety/equipment, Mod assist   General bed mobility comments: assist to lift  trunk, guided legs off bed and assist to scoot hips, pt to supine with assist for legs into bed and RN/PT assist to scoot up in bed    Transfers Overall transfer level: Needs assistance Equipment used: Rolling walker (2 wheels) Transfers: Sit to/from Stand Sit to Stand: Min assist                Ambulation/Gait Ambulation/Gait assistance: Min assist, Mod assist, +2 safety/equipment Gait Distance (Feet): 4 Feet Assistive device: Rolling walker (2 wheels) Gait Pattern/deviations: Step-to pattern, Decreased stance time - left, Knee hyperextension - left, Decreased stride length, Wide base of support       General Gait Details: steadily slower steps and cues and assist for walker safety as pt picking it up, noted L knee buckling backward and pt eventually requesting to sit so placed bed behind her as more assist needed and pt noting unable to keep taking steps with L knee with more hyperextension   Stairs             Wheelchair Mobility    Modified Rankin (Stroke Patients Only)       Balance     Sitting balance-Leahy Scale: Fair Sitting balance - Comments: on EOB feet supported with S   Standing balance support: Bilateral upper extremity supported Standing balance-Leahy Scale: Poor Standing balance comment: UE support on RW in standing                            Cognition Arousal/Alertness: Lethargic Behavior During Therapy: Flat affect Overall Cognitive Status: Impaired/Different from baseline Area of Impairment: Awareness, Attention, Safety/judgement, Following commands,  Problem solving               Rancho Levels of Cognitive Functioning [place] Amigos Scales of Cognitive Functioning: Confused, Inappropriate Non-Agitated   Current Attention Level: Focused   Following Commands: Follows one step commands with increased time, Follows one step commands inconsistently Safety/Judgement: Decreased awareness of safety, Decreased awareness of  deficits Awareness: Intellectual   General Comments: eyes closed but pt attempting up OOB to pee despite redirection she had a purewick.  She aroused once up on EOB though limited awareness of deficits or safety cues for waiting for PT to have lines ready and realizing L side weakness with ambulation and did want to walk further   [facility] Scales of Cognitive Functioning: Confused, Inappropriate Non-Agitated    Exercises      General Comments General comments (skin integrity, edema, etc.): VSS though RN reports in SVT earlier and improved with meds.  Patient wanting to get to bathroom, though urinated once back in supine and placed pure wick though still leaked some. RN in the room assisting      Pertinent Vitals/Pain Pain Assessment Pain Assessment: Faces Faces Pain Scale: Hurts little more Pain Location: head Pain Descriptors / Indicators: Headache Pain Intervention(s): Monitored during session, Repositioned    Home Living                          Prior Function            PT Goals (current goals can now be found in the care plan section) Progress towards PT goals: Not progressing toward goals - comment (lethargy)    Frequency    Min 4X/week      PT Plan Current plan remains appropriate    Co-evaluation              AM-PAC PT "6 Clicks" Mobility   Outcome Measure  Help needed turning from your back to your side while in a flat bed without using bedrails?: A Lot Help needed moving from lying on your back to sitting on the side of a flat bed without using bedrails?: A Lot Help needed moving to and from a bed to a chair (including a wheelchair)?: Total Help needed standing up from a chair using your arms (e.g., wheelchair or bedside chair)?: Total Help needed to walk in hospital room?: Total Help needed climbing 3-5 steps with a railing? : Total 6 Click Score: 8    End of Session Equipment Utilized During Treatment: Gait belt Activity  Tolerance: Patient limited by fatigue Patient left: in bed;with bed alarm set;with call bell/phone within reach;with restraints reapplied;with nursing/sitter in room   PT Visit Diagnosis: Other abnormalities of gait and mobility (R26.89);Other symptoms and signs involving the nervous system (R29.898);History of falling (Z91.81);Hemiplegia and hemiparesis Hemiplegia - Right/Left: Left Hemiplegia - dominant/non-dominant: Non-dominant Hemiplegia - caused by:  (traumatic SAH)     Time: 4098-1191 PT Time Calculation (min) (ACUTE ONLY): 23 min  Charges:  $Therapeutic Activity: 23-37 mins                     Sheran Lawless, PT Acute Rehabilitation Services [phone] 05/24/2023    [name] 05/24/2023, 5:15 PM

## 2023-05-24 NOTE — Progress Notes (Signed)
Okay to use cuff pressure for BP instead of A-line per Neurology NP.   05/24/2023 Oralia Manis, RN 12:00 PM

## 2023-05-24 NOTE — Progress Notes (Signed)
Dear Doctor: This patient has been identified as a candidate for PICC for the following reason (s): drug pH or osmolality (causing phlebitis, infiltration in 24 hours) and poor veins/poor circulatory system (CHF, COPD, emphysema, diabetes, steroid use, IV drug abuse, etc.) If you agree, please write an order for the indicated device. For any questions contact the Vascular Access Team at [phone] if no answer, please leave a message.  Thank you for supporting the early vascular access assessment program. 

## 2023-05-24 NOTE — Evaluation (Signed)
Clinical/Bedside Swallow Evaluation Patient Details  Name: Lindsay [name] MRN: [id number] Date of Birth: 1961-06-07  Today's Date: 05/24/2023 Time: SLP Start Time (ACUTE ONLY): 1445 SLP Stop Time (ACUTE ONLY): 1500 SLP Time Calculation (min) (ACUTE ONLY): 15 min  Past Medical History:  Past Medical History:  Diagnosis Date   Allergic rhinitis, cause unspecified    seasonal   Anginal pain (HCC) 2013   Stress   Arthritis    hips, thumb,knees   Diabetes mellitus without complication (HCC)    Essential hypertension, benign 12/12/2011   Joint pain    Multiple drug allergies    Obesity    Wears glasses    Wears partial dentures    upper   Past Surgical History:  Past Surgical History:  Procedure Laterality Date   COLONOSCOPY  12/11/2006   [facility]   LEFT HEART CATH AND CORONARY ANGIOGRAPHY N/A 11/11/2018   Procedure: LEFT HEART CATH AND CORONARY ANGIOGRAPHY;  Surgeon: Yvonne Kendall, MD;  Location: [facility] INVASIVE CV LAB;  Service: Cardiovascular;  Laterality: N/A;   left knee surgery  1990   arthroscopy   ROTATOR CUFF REPAIR  09/1990   right shoulder   tonsils removed     as a child   TOTAL HIP ARTHROPLASTY Left 01/17/2022   Procedure: LEFT TOTAL HIP ARTHROPLASTY ANTERIOR APPROACH;  Surgeon: Marcene Corning, MD;  Location: [facility] ORS;  Service: Orthopedics;  Laterality: Left;   TOTAL HIP ARTHROPLASTY Right 03/28/2022   Procedure: RIGH TOTAL HIP ARTHROPLASTY ANTERIOR APPROACH;  Surgeon: Marcene Corning, MD;  Location: [facility] ORS;  Service: Orthopedics;  Laterality: Right;   TUBAL LIGATION  02/08/1989   WISDOM TOOTH EXTRACTION     age 45s   HPI:  Patient is a 62 yo Female w/ pertinent PMH coronary artery vasospasm, DMT2, HTN presents to [facility] ED on 6/11 w/ head bleed (had syncopal episode falling and hit back of head).  CT head showing hemorrhage in the anterior right frontal lobe, likely contusion,  with surrounding edema; Additional SDH and SAH along the right frontal convexity,  with 4 mm right-to-left midline shift. Repeat CT 6/12 revealed increase of hemorrhagic contusion with worsening surrounding vasogenic edema and mass effect.    Assessment / Plan / Recommendation  Clinical Impression  Pt sleepy throughout eval, kept eyes closed intermittently but able to participate. Oral hygiene administered revealing dry lips and lingual candidias. There was no significant facial or lingual asymmetry, decreased strength or ROM and her attempt at a cough was weak. Pt wears dentures although not present (RN to ask family to bring). Cup and straw sips water with pt assiting to hold cup with right hand. Appeared to have intermittent delayed swallow versus brief holding in oral cavity. Consecutive sips taken over multiple trials without indications of decreased airway protection or pharyngeal residue. Her vocal quality was clear. Pt self fed bites applesauce with timely appearing swallows. Recommend pt have sips water and meds crushed only given decreased alertness and therapist will follow up for initiation of diet/liquid orders.Full assist/supervision with water. SLP Visit Diagnosis: Dysphagia, unspecified (R13.10)    Aspiration Risk  Mild aspiration risk    Diet Recommendation Other (Comment);NPO except meds (sips water)   Liquid Administration via: Straw Medication Administration: Crushed with puree Supervision: Staff to assist with self feeding Compensations: Slow rate;Small sips/bites Postural Changes: Seated upright at 90 degrees    Other  Recommendations Oral Care Recommendations: Oral care QID    Recommendations for follow up therapy are one component of  a multi-disciplinary discharge planning process, led by the attending physician.  Recommendations may be updated based on patient status, additional functional criteria and insurance authorization.  Follow up Recommendations Acute inpatient rehab (3hours/day)      Assistance Recommended at Discharge    Functional  Status Assessment Patient has had a recent decline in their functional status and demonstrates the ability to make significant improvements in function in a reasonable and predictable amount of time.  Frequency and Duration min 2x/week  2 weeks       Prognosis Prognosis for improved oropharyngeal function: Good      Swallow Study   General Date of Onset: 05/22/23 HPI: Patient is a 62 yo Female w/ pertinent PMH coronary artery vasospasm, DMT2, HTN presents to [facility] ED on 6/11 w/ head bleed (had syncopal episode falling and hit back of head).  CT head showing hemorrhage in the anterior right frontal lobe, likely contusion,  with surrounding edema; Additional SDH and SAH along the right frontal convexity, with 4 mm right-to-left midline shift. Repeat CT 6/12 revealed increase of hemorrhagic contusion with worsening surrounding vasogenic edema and mass effect. Type of Study: Bedside Swallow Evaluation Previous Swallow Assessment:  (none) Diet Prior to this Study: NPO Temperature Spikes Noted: No Respiratory Status: Room air History of Recent Intubation: No Behavior/Cognition: Lethargic/Drowsy;Cooperative;Pleasant mood Oral Cavity Assessment: Dry;Other (comment) (lingual candidias) Oral Care Completed by SLP: Yes Oral Cavity - Dentition: Dentures, not available;Edentulous Vision:  (kept eyes closed most of eval) Self-Feeding Abilities: Needs assist (used right hand with spoon) Patient Positioning: Upright in bed Baseline Vocal Quality: Low vocal intensity Volitional Cough: Weak Volitional Swallow: Able to elicit    Oral/Motor/Sensory Function Overall Oral Motor/Sensory Function: Within functional limits   Ice Chips Ice chips: Within functional limits Presentation: Spoon   Thin Liquid Thin Liquid: Impaired Presentation: Straw;Cup Oral Phase Impairments:  (none) Pharyngeal  Phase Impairments: Suspected delayed Swallow    Nectar Thick Nectar Thick Liquid: Not tested   Honey Thick Honey  Thick Liquid: Not tested   Puree Puree: Within functional limits   Solid     Solid: Not tested      Royce Macadamia 05/24/2023,3:32 PM

## 2023-05-24 NOTE — Progress Notes (Addendum)
IP rehab admissions - Patient lethargic and not communicating with me on rounds this am.  I called patient's son and left message for him to return my call.  I did leave rehab booklets at the bedside.  Call for questions.  [phone]  I did receive a call back from patient's son.  I discussed the option of in house rehab versus SNF placement.  At this point, son does not have anyone to stay with patient to care for patient after a potential CIR admission.  Patient lived alone.  I encouraged son to discuss options with family and others.  We will check back in the next couple days.  If no caregiver can be obtained, patient likely will need SNF placement for rehab.  Call for questions.  [phone]

## 2023-05-24 NOTE — Progress Notes (Signed)
NAME:  Lindsay [name], MRN:  [id number], DOB:  18-Mar-1961, LOS: 2 ADMISSION DATE:  05/22/2023, CONSULTATION DATE:  6/11 REFERRING MD:  Dr. Elpidio Anis, CHIEF COMPLAINT:  Head bleed   History of Present Illness:  Patient is a 62 yo Female w/ pertinent PMH coronary artery vasospasm, DMT2, HTN presents to [facility] ED on 6/11 w/ head bleed.  On 6/11, patient was at work and had syncopal episode falling and hit back of head. Not on any blood thinners at home. EMS called. Found patient HR 160s and was given 6 mg adenosine then given 20 mg cardizem converting back to sinus rhythm.   Upon arrival to [facility] ED, patient hemodynamically stable and afebrile. EKG sinus rhythm. CT head showing hemorrhage in the anterior right frontal lobe, likely contusion, with surrounding edema; Additional SDH and SAH along the right frontal convexity, with 4 mm right-to-left midline shift. NSG consulted recommending SBP goal <150 and Keppra 500 bid. Recommend ICU admission for close neuro monitoring. PCCM consulted.  Pertinent ED labs: ethanol and UDS wnl, trop 9, K 3.1, mag 1.5, glucose 177 CTA chest with no PE; large hiatal hernia; b/l adrenal nodules possible adenomas.  Pertinent  Medical History   Past Medical History:  Diagnosis Date   Allergic rhinitis, cause unspecified    seasonal   Anginal pain (HCC) 2013   Stress   Arthritis    hips, thumb,knees   Diabetes mellitus without complication (HCC)    Essential hypertension, benign 12/12/2011   Joint pain    Multiple drug allergies    Obesity    Wears glasses    Wears partial dentures    upper     Significant Hospital Events: Including procedures, antibiotic start and stop dates in addition to other pertinent events   6/11 syncopal episode then hit head; ct head with head bleed and midline shift  Interim History / Subjective:  Overnight had decreased mentation-- though possibly due to skelaxin, but then CT showed extension of her bleed. Was started on hypertonic  saline, had Aline placed. Multiple episodes of SVT. This morning she remains confused but is more awake this morning since starting 3% saline per RN.   Objective   Blood pressure (!) 154/69, pulse 70, temperature 98.9 F (37.2 C), temperature source Axillary, resp. rate (!) 24, height 5' 7.5" (1.715 m), weight 90.6 kg, last menstrual period 03/26/2012, SpO2 100 %.        Intake/Output Summary (Last 24 hours) at 05/24/2023 0729 Last data filed at 05/24/2023 0600 Gross per 24 hour  Intake 987.35 ml  Output 1850 ml  Net -862.65 ml    Filed Weights   05/22/23 1104 05/23/23 0435 05/24/23 0500  Weight: 90.7 kg 90.7 kg 90.6 kg    Examination: General: elderly woman lying in bed in NAD, more sleepy than yesterday HEENT: eyes anicteric, scalp lac not examined Neuro: Says her name, answers some questions but more slowly than yesterday.  Moving all extremities, follows commands. Short attention span limits more formal coordination and strength testing . CV: S1S2, RRR PULM:  breathing comfortably on RA, CTAB GI: soft, NT Extremities: no cyanosis or edema Skin: warm, dry, no diffuse rashes  Na+ 140 K+ 3.1 BUN <5 Cr 0.5  Echo : LVEF 60-65%, no RWMA. RV normal function. Moderately elevated PASP. Dilated IVC with reduced variability.   CT head: 5.5x3.2x 3.3 cm anterior R frontal lobe with vasogenic edema surrounding.  1.2cm hemorrhagic contision anterior left frontal. Small er SAH. Thalamic bilateral hypodensity-possibly  suggesting of ischemia.  Nondepressed skull fracture.   Resolved Hospital Problem list   Lactic acidosis  Assessment & Plan:  Syncopal episode-- worry about potential cardiac etiology. No seizure history. Without oxygen requirement or tachycardia, very low risk this is a PE.  -con't tele monitoring -Cardiology consult -have previously discussed syncope precautions with her and her son & daughter- no driving x 6 months and avoid high risk activities without supervision--  ladders, swimming, etc.   Right Frontal Lobe Intracranial Hemorrhage w/ SDH and SAH; counter-coup injury from falling backwards> worse on follow up imaging. Now with midline shift/ brain compression 5mm R>L. Now smaller left frontal contusion.  Small posterior scalp laceration -management per NS & Neurology -con't close monitoring for neuro changes; avoid additional doses of skelaxin -con't to hold [facility] with bleednig extension -3% saline; needs frequent Na+ monitoring. STAT recheck now. Goal 145-150. -goal SBP <160 -seizure prophylaxis with keppra -neuroprotective measures- avoid fevers, hypotonic fluids, electrolyte abnormalities -holding statin and antiplatelets -avoid sedating meds, holding muscle relaxers -4 staples placed on occipital scalp; will need to be removed in 7-10 days (June 18-21) -PT, OT, SLP  SVT: patient found to be in SVT rate 160s when EMS arrived; given adenosine and cardizem, now NSR. Recurrent overnight on 6/13 Hypokalemia Hypomagnesemia -replete electrolytes, con't to monitor -monitor on tele -worry she may need event monitor after discharge  Pre-DM; A1c 5.7, not on oral hypoglycemics PTA -Sliding scale insulin as needed - Goal blood glucose 100 4180  Urinary retention -Continue Flomax  HTN Hx of coronary vasospasm -Hold PTA oral antihypertensives - Goal systolic blood pressure less than 160, continue Cleviprex as needed  Peripheral neuropathy -Continue holding PTA gabapentin.  Monitor for withdrawal symptoms.  GERD Large hiatal hernia -PPI, continue during this admission due to head bleed  Adrenal nodules (possible adenomas) -needs OP evaluation   No family at bedside during rounds this morning.    Best Practice (right click and "Reselect all SmartList Selections" daily)   Diet/type: NPO DVT prophylaxis: SCD GI prophylaxis: PPI Lines: Arterial Line Foley:  N/A Code Status:  full code Last date of multidisciplinary goals of care  discussion [6/12 updated daughter and son at bedside]  Labs   CBC: Recent Labs  Lab 05/22/23 1102 05/22/23 1114 05/23/23 0215  WBC 6.1  --  9.3  HGB 12.8 13.9 12.9  HCT 40.5 41.0 40.6  MCV 77.4*  --  76.9*  PLT 226  --  198     Basic Metabolic Panel: Recent Labs  Lab 05/22/23 1102 05/22/23 1114 05/22/23 1805 05/23/23 0215 05/24/23 0242  NA 138 139 134* 136 140  K 3.1* 3.6 3.3* 3.4* 3.1*  CL 102 103 98 97* 107  CO2 22  --  22 20* 21*  GLUCOSE 177* 173* 149* 137* 116*  BUN 6* 7* 5* 5* <5*  CREATININE 0.77 0.70 0.66 0.74 0.50  CALCIUM 8.6*  --  8.6* 8.9 8.7*  MG 1.5*  --   --  2.1 1.9       Critical care time: [address]       Steffanie Dunn, DO 05/24/23 7:29 AM [facility]  For contact information, see Amion. If no response to pager, please call PCCM consult pager. After hours, 7PM- 7AM, please call Elink.

## 2023-05-24 NOTE — Progress Notes (Signed)
RT attempted to obtain A-line x2 but unable to.  RN aware.

## 2023-05-25 DIAGNOSIS — I629 Nontraumatic intracranial hemorrhage, unspecified: Secondary | ICD-10-CM | POA: Diagnosis not present

## 2023-05-25 DIAGNOSIS — I471 Supraventricular tachycardia, unspecified: Secondary | ICD-10-CM | POA: Diagnosis not present

## 2023-05-25 DIAGNOSIS — S06349A Traumatic hemorrhage of right cerebrum with loss of consciousness of unspecified duration, initial encounter: Secondary | ICD-10-CM | POA: Diagnosis not present

## 2023-05-25 DIAGNOSIS — R55 Syncope and collapse: Secondary | ICD-10-CM | POA: Diagnosis not present

## 2023-05-25 LAB — SODIUM
Sodium: 148 mmol/L — ABNORMAL HIGH (ref 135–145)
Sodium: 149 mmol/L — ABNORMAL HIGH (ref 135–145)

## 2023-05-25 LAB — BASIC METABOLIC PANEL
Anion gap: 10 (ref 5–15)
Anion gap: 9 (ref 5–15)
BUN: 6 mg/dL — ABNORMAL LOW (ref 8–23)
BUN: 8 mg/dL (ref 8–23)
CO2: 18 mmol/L — ABNORMAL LOW (ref 22–32)
CO2: 18 mmol/L — ABNORMAL LOW (ref 22–32)
Calcium: 8.1 mg/dL — ABNORMAL LOW (ref 8.9–10.3)
Calcium: 8.2 mg/dL — ABNORMAL LOW (ref 8.9–10.3)
Chloride: 122 mmol/L — ABNORMAL HIGH (ref 98–111)
Chloride: 123 mmol/L — ABNORMAL HIGH (ref 98–111)
Creatinine, Ser: 0.61 mg/dL (ref 0.44–1.00)
Creatinine, Ser: 0.69 mg/dL (ref 0.44–1.00)
GFR, Estimated: >60 mL/min (ref >60)
GFR, Estimated: >60 mL/min (ref >60)
Glucose, Bld: 135 mg/dL — ABNORMAL HIGH (ref 70–99)
Glucose, Bld: 164 mg/dL — ABNORMAL HIGH (ref 70–99)
Potassium: 3 mmol/L — ABNORMAL LOW (ref 3.5–5.1)
Potassium: 3.5 mmol/L (ref 3.5–5.1)
Sodium: 150 mmol/L — ABNORMAL HIGH (ref 135–145)
Sodium: 150 mmol/L — ABNORMAL HIGH (ref 135–145)

## 2023-05-25 LAB — GLUCOSE, CAPILLARY
Glucose-Capillary: 108 mg/dL — ABNORMAL HIGH (ref 70–99)
Glucose-Capillary: 107 mg/dL — ABNORMAL HIGH (ref 70–99)
Glucose-Capillary: 81 mg/dL (ref 70–99)
Glucose-Capillary: 104 mg/dL — ABNORMAL HIGH (ref 70–99)

## 2023-05-25 LAB — T4, FREE: Free T4: 0.79 ng/dL (ref 0.61–1.12)

## 2023-05-25 LAB — TSH: TSH: 0.345 u[IU]/mL — ABNORMAL LOW (ref 0.350–4.500)

## 2023-05-25 LAB — MAGNESIUM: Magnesium: 1.9 mg/dL (ref 1.7–2.4)

## 2023-05-25 MED ORDER — POTASSIUM CHLORIDE 10 MEQ/50ML IV SOLN
10.0000 meq | INTRAVENOUS | Status: AC
  Administered 2023-05-25 (×6): 10 meq via INTRAVENOUS
  Filled 2023-05-25 (×6): qty 50

## 2023-05-25 MED ORDER — DILTIAZEM HCL 30 MG PO TABS
30.0000 mg | ORAL_TABLET | Freq: Four times a day (QID) | ORAL | Status: DC
  Administered 2023-05-25 – 2023-05-26 (×4): 30 mg via ORAL
  Filled 2023-05-25 (×4): qty 1

## 2023-05-25 MED ORDER — HEPARIN SODIUM (PORCINE) 5000 UNIT/ML IJ SOLN
5000.0000 [IU] | Freq: Three times a day (TID) | INTRAMUSCULAR | Status: DC
  Administered 2023-05-25 – 2023-06-07 (×39): 5000 [IU] via SUBCUTANEOUS
  Filled 2023-05-25 (×39): qty 1

## 2023-05-25 MED ORDER — MAGNESIUM SULFATE 2 GM/50ML IV SOLN
2.0000 g | Freq: Once | INTRAVENOUS | Status: AC
  Administered 2023-05-25: 2 g via INTRAVENOUS
  Filled 2023-05-25: qty 50

## 2023-05-25 NOTE — Progress Notes (Signed)
NAME:  Lindsay [name], MRN:  [id number], DOB:  [birth year], LOS: 3 ADMISSION DATE:  05/22/2023, CONSULTATION DATE:  6/11 REFERRING MD:  Dr. Elpidio Anis, CHIEF COMPLAINT:  Head bleed   History of Present Illness:  Patient is a 62 yo Female w/ pertinent PMH coronary artery vasospasm, DMT2, HTN presents to [facility] ED on 6/11 w/ head bleed.  On 6/11, patient was at work and had syncopal episode falling and hit back of head. Not on any blood thinners at home. EMS called. Found patient HR 160s and was given 6 mg adenosine then given 20 mg cardizem converting back to sinus rhythm.   Upon arrival to [facility] ED, patient hemodynamically stable and afebrile. EKG sinus rhythm. CT head showing hemorrhage in the anterior right frontal lobe, likely contusion, with surrounding edema; Additional SDH and SAH along the right frontal convexity, with 4 mm right-to-left midline shift. NSG consulted recommending SBP goal <150 and Keppra 500 bid. Recommend ICU admission for close neuro monitoring. PCCM consulted.  Pertinent ED labs: ethanol and UDS wnl, trop 9, K 3.1, mag 1.5, glucose 177 CTA chest with no PE; large hiatal hernia; b/l adrenal nodules possible adenomas.  Pertinent  Medical History   Past Medical History:  Diagnosis Date   Allergic rhinitis, cause unspecified    seasonal   Anginal pain (HCC) 2013   Stress   Arthritis    hips, thumb,knees   Diabetes mellitus without complication (HCC)    Essential hypertension, benign 12/12/2011   Joint pain    Multiple drug allergies    Obesity    Wears glasses    Wears partial dentures    upper     Significant Hospital Events: Including procedures, antibiotic start and stop dates in addition to other pertinent events   6/11 syncopal episode then hit head; ct head with head bleed and midline shift  Interim History / Subjective:  Awake alert, sitting in chair No overnight events episodes of SVT appear better-on amiodarone  Remains on 3% saline  Objective    Blood pressure (!) 149/65, pulse 69, temperature 99.7 F (37.6 C), temperature source Axillary, resp. rate (!) 26, height 5' 7.5" (1.715 m), weight 92.4 kg, last menstrual period 03/26/2012, SpO2 100 %.        Intake/Output Summary (Last 24 hours) at 05/25/2023 0823 Last data filed at 05/25/2023 0600 Gross per 24 hour  Intake 2688.78 ml  Output 600 ml  Net 2088.78 ml   Filed Weights   05/23/23 0435 05/24/23 0500 05/25/23 0500  Weight: 90.7 kg 90.6 kg 92.4 kg    Examination: General: Elderly, does not appear to be in distress at rest HEENT: Moist oral mucosa Neuro: Moving all extremities, following commands, appropriate CV: S1-S2 appreciated with no murmur PULM: Clear breath sounds bilaterally GI: soft, NT Extremities: no cyanosis or edema Skin: warm, dry, no diffuse rashes  Sodium 150, potassium 3.0-being corrected  Echo : LVEF 60-65%, no RWMA. RV normal function. Moderately elevated PASP. Dilated IVC with reduced variability.   CT head:1. Stable right > left frontal and right temporal lobe hemorrhagic contusions with edema. Stable intracranial mass effect with leftward midline shift of 5-6 mm. Mild SAH.  Resolved Hospital Problem list   Lactic acidosis  Assessment & Plan:   Syncopal episode -Dr. Chestine Spore did previously discuss syncope precautions with her and her son & daughter- no driving x 6 months and avoid high risk activities without supervision-- ladders, swimming, etc.  -Appreciate cardiology input, plans for post stabilization monitoring  SVT -Continue amiodarone  Right frontal lobe intracranial hemorrhage with subdural hemorrhage and subarachnoid hemorrhage Mid 9 shifty/brain compression 5 mm right greater than left, left frontal contusion Small posterior scalp laceration -Management per neurosurgery and neurology -Neuromonitoring -Continue 3% saline -Goal systolic BP less than 160- -seizure prophylaxis with Keppra -Neuroprotective  measures  Hypomagnesemia, hypokalemia -Continue to replete  Prediabetes -Continue SSI  Urinary retention -Continue Flomax  Hypertension History of coronary vasospasm -Home antihypertensives on hold  History of peripheral neuropathy -Gabapentin on hold  GERD -Continue PPI  Adrenal nodules -Will need outpatient follow-up   Best Practice (right click and "Reselect all SmartList Selections" daily)   Diet/type: NPO DVT prophylaxis: SCD GI prophylaxis: PPI Lines: Arterial Line Foley:  N/A Code Status:  full code Last date of multidisciplinary goals of care discussion [6/12 updated daughter and son at bedside]  Labs   CBC: Recent Labs  Lab 05/22/23 1102 05/22/23 1114 05/23/23 0215  WBC 6.1  --  9.3  HGB 12.8 13.9 12.9  HCT 40.5 41.0 40.6  MCV 77.4*  --  76.9*  PLT 226  --  198    Basic Metabolic Panel: Recent Labs  Lab 05/22/23 1102 05/22/23 1114 05/22/23 1805 05/23/23 0215 05/24/23 0242 05/24/23 0827 05/24/23 1122 05/24/23 1552 05/24/23 2208 05/25/23 0322  NA 138   < > 134* 136 140 142 145 144 147* 150*  K 3.1*   < > 3.3* 3.4* 3.1* 3.7  --   --   --  3.0*  CL 102   < > 98 97* 107 113*  --   --   --  122*  CO2 22  --  22 20* 21* 20*  --   --   --  18*  GLUCOSE 177*   < > 149* 137* 116* 120*  --   --   --  135*  BUN 6*   < > 5* 5* <5* <5*  --   --   --  6*  CREATININE 0.77   < > 0.66 0.74 0.50 0.55  --   --   --  0.61  CALCIUM 8.6*  --  8.6* 8.9 8.7* 8.5*  --   --   --  8.1*  MG 1.5*  --   --  2.1 1.9  --   --   --   --  1.9   < > = values in this interval not displayed.   The patient is critically ill with multiple organ systems failure and requires high complexity decision making for assessment and support, frequent evaluation and titration of therapies, application of advanced monitoring technologies and extensive interpretation of multiple databases. Critical Care Time devoted to patient care services described in this note independent of  APP/resident time (if applicable)  is 32 minutes.   Virl Diamond MD [facility] Personal pager: See Amion If unanswered, please page CCM On-call: #[phone]

## 2023-05-25 NOTE — Progress Notes (Signed)
Physical Therapy Treatment Patient Details Name: Lindsay [name] MRN: [id number] DOB: 06/22/1961 Today's Date: 05/25/2023   History of Present Illness 62 y.o. female brought to the hospital after she apparently passed out at work, striking the back of her head. CT demonstrates traumatic pattern of right frontal hemorrhage although there is some SAH primarily in the right Sylvian fissure.    PT Comments    Patient progressing ambulation into hallway with chair follow for safety.  Still weak on L LE but not buckling today, needing cues for foot clearance.  Completed ADL in bathroom with min to mod A for balance for toilet hygiene.  Patient more alert and able to make her needs known today.  She remains appropriate for intensive inpatient rehab prior to d/c home.    Recommendations for follow up therapy are one component of a multi-disciplinary discharge planning process, led by the attending physician.  Recommendations may be updated based on patient status, additional functional criteria and insurance authorization.  Follow Up Recommendations       Assistance Recommended at Discharge Intermittent Supervision/Assistance  Patient can return home with the following Assistance with cooking/housework;Assist for transportation;Help with stairs or ramp for entrance;Direct supervision/assist for medications management;A lot of help with bathing/dressing/bathroom;A lot of help with walking and/or transfers   Equipment Recommendations  None recommended by PT    Recommendations for Other Services       Precautions / Restrictions Precautions Precautions: Fall     Mobility  Bed Mobility               General bed mobility comments: up in chair    Transfers Overall transfer level: Needs assistance Equipment used: Rolling walker (2 wheels) Transfers: Sit to/from Stand Sit to Stand: Mod assist           General transfer comment: heavier assist due to posterior lean     Ambulation/Gait Ambulation/Gait assistance: Min assist, +2 safety/equipment, Mod assist Gait Distance (Feet): 23 Feet Assistive device: Rolling walker (2 wheels) Gait Pattern/deviations: Step-to pattern, Step-through pattern, Decreased stride length, Shuffle, Decreased dorsiflexion - left       General Gait Details: increased time, L foot dragging at times, improves some with cues, one episode when turning in the bathroom with LOB over L LE mod A to recover, walked into hallway with RN following with chair   Stairs             Wheelchair Mobility    Modified Rankin (Stroke Patients Only)       Balance Overall balance assessment: Needs assistance Sitting-balance support: Feet supported Sitting balance-Leahy Scale: Fair   Postural control: Posterior lean Standing balance support: Bilateral upper extremity supported Standing balance-Leahy Scale: Poor Standing balance comment: posterior lean while standing for toilet hygiene after toileting in bathroom mod to min A for balance                            Cognition Arousal/Alertness: Lethargic Behavior During Therapy: WFL for tasks assessed/performed Overall Cognitive Status: Impaired/Different from baseline Area of Impairment: Attention, Problem solving, Safety/judgement, Following commands               Rancho Levels of Cognitive Functioning Rancho Los Amigos Scales of Cognitive Functioning: Confused, Appropriate   Current Attention Level: Sustained   Following Commands: Follows one step commands consistently, Follows one step commands with increased time Safety/Judgement: Decreased awareness of deficits   Problem Solving: Slow processing General Comments:  aroused from sleep and eager to get to bathroom, still slower with processing and completing tasks and not as aware of deficits   Rancho Mirant Scales of Cognitive Functioning: Confused, Appropriate    Exercises      General Comments  General comments (skin integrity, edema, etc.): VSS on RA throughout noted pt incontinent upon standing after purewick removed      Pertinent Vitals/Pain Pain Assessment Pain Assessment: Faces Faces Pain Scale: Hurts even more Pain Location: head Pain Descriptors / Indicators: Headache Pain Intervention(s): Monitored during session, Repositioned    Home Living                          Prior Function            PT Goals (current goals can now be found in the care plan section) Progress towards PT goals: Progressing toward goals    Frequency    Min 4X/week      PT Plan Current plan remains appropriate    Co-evaluation              AM-PAC PT "6 Clicks" Mobility   Outcome Measure  Help needed turning from your back to your side while in a flat bed without using bedrails?: A Lot Help needed moving from lying on your back to sitting on the side of a flat bed without using bedrails?: A Lot Help needed moving to and from a bed to a chair (including a wheelchair)?: A Lot Help needed standing up from a chair using your arms (e.g., wheelchair or bedside chair)?: A Lot Help needed to walk in hospital room?: A Lot Help needed climbing 3-5 steps with a railing? 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

## 2023-05-25 NOTE — Progress Notes (Signed)
Speech Language Pathology Treatment: Dysphagia  Patient Details Name: Lindsay [name] MRN: [id number] DOB: 06-May-1961 Today's Date: 05/25/2023 Time: 1035-1100 SLP Time Calculation (min) (ACUTE ONLY): 25 min  Assessment / Plan / Recommendation Clinical Impression  Pt seen for dysphagia f/u tx/swallow re-assessment for po readiness with pt alertness level improved, but pt still remains intermittently lethargic, but was able to self-feed cup of puree and thin via straw with min cues required and no s/sx of aspiration noted.  Impaired mastication efforts with soft solid (partially d/t edentulous state) with dentures unavailable and pt also stating "I don't use them anyway to eat."  Slight oral holding observed d/t mentation, but pt was able to consume solid with min verbal/tactile cues and liquid wash.  Recommend initiating a Dysphagia 3(mechanical soft) diet with thin liquids and FULL supervision/A with self-feeding during meals/snacks for safety purposes as pt mentation waxes/wanes and holding tray if pt unable to maintain adequate arousal level for po consumption.  ST will f/u for diet tolerance/advancement during acute stay.  HPI HPI: Patient is a 62 yo Female w/ pertinent PMH coronary artery vasospasm, DMT2, HTN presents to [facility] ED on 6/11 w/ head bleed (had syncopal episode falling and hit back of head). CT head showing hemorrhage in the anterior right frontal lobe, likely contusion, with surrounding edema; Additional SDH and SAH along the right frontal convexity, with 4 mm right-to-left midline shift. Repeat CT 6/12 revealed increase of hemorrhagic contusion with worsening surrounding vasogenic edema and mass effect; ST completed BSE on 6/13 with recommendation for NPO with sips/thin only.  ST f/u for po readiness/initiate diet.      SLP Plan  Goals updated      Recommendations for follow up therapy are one component of a multi-disciplinary discharge planning process, led by the attending physician.   Recommendations may be updated based on patient status, additional functional criteria and insurance authorization.    Recommendations  Diet recommendations: Dysphagia 3 (mechanical soft);Thin liquid Liquids provided via: Cup;Straw Medication Administration: Whole meds with liquid Supervision: Full supervision/cueing for compensatory strategies;Staff to assist with self feeding Compensations: Slow rate;Small sips/bites                  Oral care BID     Dysphagia, unspecified (R13.10)     Goals updated     Pat Lindsay [name],M.S., CCC-SLP  05/25/2023, 1:02 PM

## 2023-05-25 NOTE — Evaluation (Addendum)
Speech Language Pathology Evaluation Patient Details Name: Lindsay [name] MRN: [id number] DOB: 1961/06/29 Today's Date: 05/25/2023 Time: 1035-1100 SLP Time Calculation (min) (ACUTE ONLY): 25 min  Problem List:  Patient Active Problem List   Diagnosis Date Noted   Traumatic right-sided intracerebral hemorrhage with loss of consciousness (HCC) 05/24/2023   SVT (supraventricular tachycardia) 05/24/2023   Intracranial hemorrhage (HCC) 05/22/2023   Syncope 05/22/2023   Hypokalemia 05/22/2023   Hypomagnesemia 05/22/2023   Primary localized osteoarthritis of right hip 03/28/2022   Primary osteoarthritis of right hip 03/28/2022   Primary localized osteoarthritis of left hip 01/17/2022   Primary osteoarthritis of left hip 01/17/2022   Coronary vasospasm (HCC) 11/14/2018   NSTEMI (non-ST elevated myocardial infarction) (HCC) 11/09/2018   Essential hypertension, benign 01/26/2014   Obesity, unspecified 01/26/2014   Allergy, urticaria 04/02/2012   Past Medical History:  Past Medical History:  Diagnosis Date   Allergic rhinitis, cause unspecified    seasonal   Anginal pain (HCC) 2013   Stress   Arthritis    hips, thumb,knees   Diabetes mellitus without complication (HCC)    Essential hypertension, benign 12/12/2011   Joint pain    Multiple drug allergies    Obesity    Wears glasses    Wears partial dentures    upper   Past Surgical History:  Past Surgical History:  Procedure Laterality Date   COLONOSCOPY  12/11/2006   [facility]   LEFT HEART CATH AND CORONARY ANGIOGRAPHY N/A 11/11/2018   Procedure: LEFT HEART CATH AND CORONARY ANGIOGRAPHY;  Surgeon: Yvonne Kendall, MD;  Location: [facility] INVASIVE CV LAB;  Service: Cardiovascular;  Laterality: N/A;   left knee surgery  1990   arthroscopy   ROTATOR CUFF REPAIR  09/1990   right shoulder   tonsils removed     as a child   TOTAL HIP ARTHROPLASTY Left 01/17/2022   Procedure: LEFT TOTAL HIP ARTHROPLASTY ANTERIOR APPROACH;   Surgeon: Marcene Corning, MD;  Location: [facility] ORS;  Service: Orthopedics;  Laterality: Left;   TOTAL HIP ARTHROPLASTY Right 03/28/2022   Procedure: RIGH TOTAL HIP ARTHROPLASTY ANTERIOR APPROACH;  Surgeon: Marcene Corning, MD;  Location: [facility] ORS;  Service: Orthopedics;  Laterality: Right;   TUBAL LIGATION  02/08/1989   WISDOM TOOTH EXTRACTION     age 51s   HPI:  Patient is a 62 yo Female w/ pertinent PMH coronary artery vasospasm, DMT2, HTN presents to [facility] ED on 6/11 w/ head bleed (had syncopal episode falling and hit back of head). CT head showing hemorrhage in the anterior right frontal lobe, likely contusion, with surrounding edema; Additional SDH and SAH along the right frontal convexity, with 4 mm right-to-left midline shift. Repeat CT 6/12 revealed increase of hemorrhagic contusion with worsening surrounding vasogenic edema and mass effect; ST completed BSE on 6/13 with recommendation for NPO with sips/thin only. ST f/u for po readiness/initiate diet and for speech/language assessment.   Assessment / Plan / Recommendation Clinical Impression  Pt seen for speech/language assessment with portions of [employer] Mental Status Examination (SLUMS) with an overall score unable to be achieved d/t pt arousal level/intermittent verbal cues required to maintain alertness to tasks.  Pt with deficits in the areas of sustained attention, recall of new information with nursing reporting pt repeating information, but pt able to recall objects after a time constraint.  Pt requesting purse/phone, but nursing staff had previously discussed with pt the fact that her son had taken this home and pt unable to recall this conversation  prompting another request.  Simple problem solving for environment accurate, but complex problem solving unable to be completed.  Pt with increased orientation as she was Ox4 this date.  Pt could follow simple 1-2 step commands and answer personal information questions accurately, but  decreased alertness level impacting overall performance.  Recommend for ST to f/u in acute setting for cognitive re-assessment/diet tolerance.  CIR recommended.  Thank you for this consult.    SLP Assessment  SLP Recommendation/Assessment: Patient needs continued Speech Language Pathology Services SLP Visit Diagnosis: Cognitive communication deficit (R41.841);Attention and concentration deficit;Frontal lobe and executive function deficit Attention and concentration deficit following: Nontraumatic SAH Frontal lobe and executive function deficit following: Nontraumatic intracerebral hemorrhage    Recommendations for follow up therapy are one component of a multi-disciplinary discharge planning process, led by the attending physician.  Recommendations may be updated based on patient status, additional functional criteria and insurance authorization.    Follow Up Recommendations  Acute inpatient rehab (3hours/day)    Assistance Recommended at Discharge  Frequent or constant Supervision/Assistance  Functional Status Assessment Patient has had a recent decline in their functional status and demonstrates the ability to make significant improvements in function in a reasonable and predictable amount of time.  Frequency and Duration min 2x/week  1 week      SLP Evaluation Cognition  Overall Cognitive Status: Impaired/Different from baseline Arousal/Alertness: Lethargic Orientation Level: Oriented to person;Oriented to place;Oriented to situation;Oriented to time Year: 2024 Month: June Day of Week: Incorrect Attention: Sustained Sustained Attention: Impaired Sustained Attention Impairment: Verbal basic;Functional basic Memory: Impaired Memory Impairment: Decreased recall of new information;Other (comment) (nursing stated she is repeating information/questions; able to recall 5 items after a time delay) Problem Solving: Appears intact (for simple problem solving) Executive Function:  Landscape architect: Impaired Organizing Impairment: Verbal basic;Functional basic Behaviors: Perseveration;Other (comment) (decreased arousal impacting performance) Safety/Judgment: Other (comment) (DTA; able to state simple problem solving/safety precautions, but arousal level impacting performance)       Comprehension  Auditory Comprehension Overall Auditory Comprehension: Impaired Commands: Impaired Multistep Basic Commands: 25-49% accurate Conversation: Simple Interfering Components: Attention;Processing speed;Other (comment) (arousal level decreased) EffectiveTechniques: Repetition Visual Recognition/Discrimination Discrimination: Not tested Reading Comprehension Reading Status: Not tested    Expression Expression Primary Mode of Expression: Verbal Verbal Expression Overall Verbal Expression: Impaired Initiation: Impaired Level of Generative/Spontaneous Verbalization: Sentence Repetition: Impaired (d/t arousal level being decreased) Level of Impairment: Phrase level Naming: Impairment Responsive: 26-50% accurate Confrontation: Within functional limits Convergent: 25-49% accurate Divergent: 25-49% accurate Other Naming Comments: Naming affected d/t arousal level impairment Pragmatics: Unable to assess Interfering Components: Attention Non-Verbal Means of Communication: Not applicable Written Expression Dominant Hand: Right Written Expression: Not tested   Oral / Motor  Oral Motor/Sensory Function Overall Oral Motor/Sensory Function: Generalized oral weakness (primarily d/t mentation level) Motor Speech Overall Motor Speech: Appears within functional limits for tasks assessed Respiration: Within functional limits Phonation: Low vocal intensity;Other (comment) (d/t decreased arousal level) Resonance: Within functional limits Articulation: Within functional limitis Intelligibility: Intelligible Motor Planning: Witnin functional limits Motor Speech Errors: Not  applicable Effective Techniques: Increased vocal intensity            Pat Dainelle Hun,M.S., CCC-SLP 05/25/2023, 1:21 PM

## 2023-05-25 NOTE — Progress Notes (Signed)
  NEUROSURGERY PROGRESS NOTE   Pt reports feeling much better this am. Sitting in bedside chair.  EXAM:  BP (!) 149/65   Pulse 69   Temp 99.7 F (37.6 C) (Axillary)   Resp (!) 26   Ht 5' 7.5" (1.715 m)   Wt 92.4 kg   LMP 03/26/2012   SpO2 100%   BMI 31.43 kg/m   easily arouses to voice, oriented  Speech fluent CN grossly intact  5/5 BUE/BLE   IMPRESSION:  62 y.o. female s/p fall after syncopal episode. Neurologic exam remains stable, c/w severe concussion. Etiology of syncope being investigated. EEG negative.  PLAN: - Cont neurologic observation - Finish 7d Keppra - Can f/u in clinic on PRN basis, suspect she will need TBI rehab   Lisbeth Renshaw, MD [facility]

## 2023-05-25 NOTE — Progress Notes (Signed)
Inpatient Rehab Admissions Coordinator:   Continues on 3% saline.  We will f/u on Monday.   Estill Dooms, PT, DPT Admissions Coordinator [phone] 05/25/23  12:06 PM

## 2023-05-25 NOTE — Progress Notes (Signed)
[facility] ADULT ICU REPLACEMENT PROTOCOL   The patient does apply for the [facility] Adult ICU Electrolyte Replacment Protocol based on the criteria listed below:   1.Exclusion criteria: TCTS, ECMO, Dialysis, and Myasthenia Gravis patients 2. Is GFR >/= 30 ml/min? Yes.    Patient's GFR today is >60 3. Is SCr </= 2? Yes.   Patient's SCr is 0.61 mg/dL 4. Did SCr increase >/= 0.5 in 24 hours? No. 5.Pt's weight >40kg  Yes.   6. Abnormal electrolyte(s): Potassium 3.0, Magnesium 1.9  7. Electrolytes replaced per protocol 8.  Call MD STAT for K+ </= 2.5, Phos </= 1, or Mag </= 1 Physician:  Dr. Vladimir Faster  Heath Tesler A Lakai Moree 05/25/2023 5:40 AM

## 2023-05-25 NOTE — Progress Notes (Addendum)
Progress Note  Patient Name: Lindsay [name] Date of Encounter: 05/25/2023  Primary Cardiologist: Parke Poisson, Lindsay [name]  Subjective   Denies CP or SOB. Per nurse, doing better today. She does not recall meeting cards Lindsay [name] yesterday. Notes indicate she was confused at that time.  Inpatient Medications    Scheduled Meds:  Chlorhexidine Gluconate Cloth  6 each Topical Daily   insulin aspart  0-6 Units Subcutaneous TID WC   lidocaine  1 patch Transdermal Q24H   pantoprazole (PROTONIX) IV  40 mg Intravenous QHS   sodium chloride flush  10-40 mL Intracatheter Q12H   tamsulosin  0.4 mg Oral Daily   Continuous Infusions:  sodium chloride 10 mL/hr at 05/25/23 0609   amiodarone 30 mg/hr (05/25/23 0600)   clevidipine     levETIRAcetam Stopped (05/24/23 2142)   potassium chloride 10 mEq (05/25/23 0751)   sodium chloride (hypertonic) 75 mL/hr at 05/25/23 0600   PRN Meds: Place/Maintain arterial line **AND** sodium chloride, acetaminophen, butalbital-acetaminophen-caffeine, docusate sodium, hydrALAZINE, labetalol, mouth rinse, polyethylene glycol, sodium chloride flush   Vital Signs    Vitals:   05/25/23 0530 05/25/23 0600 05/25/23 0630 05/25/23 0809  BP: (!) 113/55 134/62 (!) 149/65   Pulse: 93 82 69   Resp: (!) 26 18 (!) 26   Temp:    99.7 F (37.6 C)  TempSrc:    Axillary  SpO2: 100% 100% 100%   Weight:      Height:        Intake/Output Summary (Last 24 hours) at 05/25/2023 0824 Last data filed at 05/25/2023 0600 Gross per 24 hour  Intake 2688.78 ml  Output 600 ml  Net 2088.78 ml      05/25/2023    5:00 AM 05/24/2023    5:00 AM 05/23/2023    4:35 AM  Last 3 Weights  Weight (lbs) 203 lb 11.3 oz 199 lb 11.8 oz 199 lb 15.3 oz  Weight (kg) 92.4 kg 90.6 kg 90.7 kg     Telemetry    NSR, occasional PACs, PVCs, overnight one brief episode of narrow complex tach (very self limited) with suspected 2 beat aberrancy in the middle - Personally Reviewed  Physical Exam   GEN: No  acute distress.  HEENT: Normocephalic, atraumatic, sclera non-icteric. Neck: No JVD or bruits. Cardiac: RRR no murmurs, rubs, or gallops.  Respiratory: Clear to auscultation bilaterally. Breathing is unlabored. GI: Soft, nontender, non-distended, BS +x 4. MS: no deformity. Extremities: No clubbing or cyanosis. No edema. Distal pedal pulses are 2+ and equal bilaterally. Neuro:  AAOx3 but somewhat slowed mentation. Follows commands. Psych:  Calm affect.  Labs    High Sensitivity Troponin:   Recent Labs  Lab 05/22/23 1102 05/22/23 1614  TROPONINIHS 9 9      Cardiac EnzymesNo results for input(s): "TROPONINI" in the last 168 hours. No results for input(s): "TROPIPOC" in the last 168 hours.   Chemistry Recent Labs  Lab 05/22/23 1102 05/22/23 1114 05/24/23 0242 05/24/23 0827 05/24/23 1122 05/24/23 1552 05/24/23 2208 05/25/23 0322  NA 138   < > 140 142   < > 144 147* 150*  K 3.1*   < > 3.1* 3.7  --   --   --  3.0*  CL 102   < > 107 113*  --   --   --  122*  CO2 22   < > 21* 20*  --   --   --  18*  GLUCOSE 177*   < > 116*  120*  --   --   --  135*  BUN 6*   < > <5* <5*  --   --   --  6*  CREATININE 0.77   < > 0.50 0.55  --   --   --  0.61  CALCIUM 8.6*   < > 8.7* 8.5*  --   --   --  8.1*  PROT 6.6  --   --   --   --   --   --   --   ALBUMIN 3.8  --   --   --   --   --   --   --   AST 20  --   --   --   --   --   --   --   ALT 12  --   --   --   --   --   --   --   ALKPHOS 64  --   --   --   --   --   --   --   BILITOT 1.0  --   --   --   --   --   --   --   GFRNONAA >60   < > >60 >60  --   --   --  >60  ANIONGAP 14   < > 12 9  --   --   --  10   < > = values in this interval not displayed.     Hematology Recent Labs  Lab 05/22/23 1102 05/22/23 1114 05/23/23 0215  WBC 6.1  --  9.3  RBC 5.23*  --  5.28*  HGB 12.8 13.9 12.9  HCT 40.5 41.0 40.6  MCV 77.4*  --  76.9*  MCH 24.5*  --  24.4*  MCHC 31.6  --  31.8  RDW 20.4*  --  20.5*  PLT 226  --  198    BNPNo  results for input(s): "BNP", "PROBNP" in the last 168 hours.   DDimer No results for input(s): "DDIMER" in the last 168 hours.   Radiology    [place] EKG SITE RITE  Result Date: 05/24/2023 If Site Rite image not attached, placement could not be confirmed due to current cardiac rhythm.  CT HEAD WO CONTRAST ( )  Result Date: 05/24/2023 CLINICAL DATA:  62 year old female status post fall striking head. Intracranial hemorrhage. Subsequent encounter. EXAM: CT HEAD WITHOUT CONTRAST TECHNIQUE: Contiguous axial images were obtained from the base of the skull through the vertex without intravenous contrast. RADIATION DOSE REDUCTION: This exam was performed according to the departmental dose-optimization program which includes automated exposure control, adjustment of the mA and/or kV according to patient size and/or use of iterative reconstruction technique. COMPARISON:  Head CT 05/23/2023 and earlier. FINDINGS: Brain: Relatively large anterior right frontal lobe hemorrhagic contusion extending from the inferior through the middle frontal gyrus with confluent regional edema. Smaller left inferior frontal gyrus hemorrhagic contusion and edema. Mild right anterior temporal lobe hemorrhagic contusion and edema. Mass effect on the anterolateral ventricles with leftward midline shift of 5-6 mm is stable. No IVH or ventriculomegaly. Basilar cisterns are patent. Trace subarachnoid hemorrhage elsewhere along the right hemisphere. No convincing SDH. Thalamic gray-white differentiation appears normal today. Stable gray-white matter differentiation throughout the brain. No cortically based acute infarct identified. Vascular: Calcified atherosclerosis at the skull base. Skull: No fracture identified. Sinuses/Orbits: Scattered paranasal sinus opacification and fluid levels, favor inflammatory. Tympanic cavities and mastoids  remain clear. Other: Left posterior scalp skin staples. No new orbit or scalp soft tissue injury.  IMPRESSION: 1. Stable right > left frontal and right temporal lobe hemorrhagic contusions with edema. Stable intracranial mass effect with leftward midline shift of 5-6 mm. Mild SAH. 2. No new intracranial abnormality. Electronically Signed   By: Odessa Fleming M.D.   On: 05/24/2023 11:02   [place] EKG SITE RITE  Result Date: 05/24/2023 If Site Rite image not attached, placement could not be confirmed due to current cardiac rhythm.  CT HEAD WO CONTRAST ( )  Result Date: 05/24/2023 CLINICAL DATA:  Initial evaluation for mental status change, unknown cause. EXAM: CT HEAD WITHOUT CONTRAST TECHNIQUE: Contiguous axial images were obtained from the base of the skull through the vertex without intravenous contrast. RADIATION DOSE REDUCTION: This exam was performed according to the departmental dose-optimization program which includes automated exposure control, adjustment of the mA and/or kV according to patient size and/or use of iterative reconstruction technique. COMPARISON:  Prior CT from 05/22/2023. FINDINGS: Brain: Continued interval evolution of posttraumatic hemorrhagic contusions involving the anterior right frontal lobe. Hyperdense hemorrhage is increased in size now measuring 5.5 x 3.2 x 3.3 cm. Surrounding vasogenic edema and regional mass effect has progressed and worsened. Associated right-to-left shift now measures up to 5 mm, progressed. An additional smaller hemorrhagic contusion at the anterior left frontal convexity measures up to 12 mm (series 4, image 8). Mild surrounding edema without significant regional mass effect. Scattered subarachnoid hemorrhage at the right frontal region has decreased from prior. Trace hemorrhage noted along the anterior falx. No new intracranial hemorrhage. No visible acute cortically based infarct. There is question of symmetric hypodensity involving the thalami bilaterally (series 4, image 12). While this appearance may be technical in nature, possible evolving ischemia or  other process is difficult to exclude. No mass lesion. No hydrocephalus. No extra-axial fluid collection. Vascular: No visible asymmetric hyperdense vessel. Skull: Evolving left posterior scalp contusion/laceration. Underlying nondisplaced, nondepressed skull fracture again noted. No new finding. Sinuses/Orbits: Globes and orbital soft tissues demonstrate no acute finding. Chronic right frontoethmoidal, sphenoid, and maxillary sinusitis again noted. Mastoid air cells remain clear. Other: None. IMPRESSION: 1. Increased size of hemorrhagic contusion involving the anterior right frontal lobe, now measuring 5.5 x 3.2 x 3.3 cm. Surrounding vasogenic edema and regional mass effect has progressed and worsened, with 5 mm of right-to-left shift now seen. 2. Additional smaller 12 mm hemorrhagic contusion at the anterior left frontal convexity. 3. Interval decrease in scattered small volume subarachnoid hemorrhage. 4. Question of symmetric hypodensity involving the thalami bilaterally. While this appearance may be technical in nature, possible evolving ischemia or other process is difficult to exclude. Correlation with dedicated MRI could be performed for further evaluation as warranted. 5. Evolving left posterior scalp contusion/laceration with underlying nondisplaced, nondepressed skull fracture. Electronically Signed   By: Rise Mu M.D.   On: 05/24/2023 02:39   ECHOCARDIOGRAM COMPLETE  Result Date: 05/23/2023    ECHOCARDIOGRAM REPORT   Patient Name:   Lindsay [name] Date of Exam: 05/23/2023 Medical Rec #:  [id number]     Height:       67.5 in Accession #:    [id number]    Weight:       200.0 lb Date of Birth:  May 04, 1961     BSA:          2.033 m Patient Age:    62 years      BP:  142/63 mmHg Patient Gender: F             HR:           51 bpm. Exam Location:  Inpatient Procedure: 2D Echo, Cardiac Doppler and Color Doppler Indications:    Syncope  History:        Patient has prior history of  Echocardiogram examinations, most                 recent 11/10/2018. Previous Myocardial Infarction and coronary                 vasospasm, Signs/Symptoms:ICH and Syncope; Risk                 Factors:Hypertension, Former Smoker and Diabetes.  Sonographer:    Wallie Char Referring Phys: [id number] Beulah Gandy PAYNE IMPRESSIONS  1. Left ventricular ejection fraction, by estimation, is 60 to 65%. The left ventricle has normal function. The left ventricle has no regional wall motion abnormalities. Left ventricular diastolic parameters were normal.  2. Right ventricular systolic function is normal. The right ventricular size is moderately enlarged. There is moderately elevated pulmonary artery systolic pressure. The estimated right ventricular systolic pressure is 48.9 mmHg.  3. The mitral valve is normal in structure. No evidence of mitral valve regurgitation. No evidence of mitral stenosis.  4. The aortic valve is tricuspid. Aortic valve regurgitation is not visualized. No aortic stenosis is present.  5. The inferior vena cava is dilated in size with <50% respiratory variability, suggesting right atrial pressure of 15 mmHg. Comparison(s): No significant change from prior study. Prior images reviewed side by side. FINDINGS  Left Ventricle: Left ventricular ejection fraction, by estimation, is 60 to 65%. The left ventricle has normal function. The left ventricle has no regional wall motion abnormalities. The left ventricular internal cavity size was normal in size. There is  no left ventricular hypertrophy. Left ventricular diastolic parameters were normal. Right Ventricle: The right ventricular size is moderately enlarged. No increase in right ventricular wall thickness. Right ventricular systolic function is normal. There is moderately elevated pulmonary artery systolic pressure. The tricuspid regurgitant  velocity is 2.91 m/s, and with an assumed right atrial pressure of 15 mmHg, the estimated right ventricular systolic  pressure is 48.9 mmHg. Left Atrium: Left atrial size was normal in size. Right Atrium: Right atrial size was normal in size. Prominent Crista terminalis. Pericardium: There is no evidence of pericardial effusion. Mitral Valve: The mitral valve is normal in structure. No evidence of mitral valve regurgitation. No evidence of mitral valve stenosis. MV peak gradient, 5.2 mmHg. The mean mitral valve gradient is 2.0 mmHg. Tricuspid Valve: The tricuspid valve is normal in structure. Tricuspid valve regurgitation is mild . No evidence of tricuspid stenosis. Aortic Valve: The aortic valve is tricuspid. Aortic valve regurgitation is not visualized. No aortic stenosis is present. Aortic valve mean gradient measures 3.5 mmHg. Aortic valve peak gradient measures 6.9 mmHg. Aortic valve area, by VTI measures 3.00 cm. Pulmonic Valve: The pulmonic valve was normal in structure. Pulmonic valve regurgitation is not visualized. No evidence of pulmonic stenosis. Aorta: The aortic root and ascending aorta are structurally normal, with no evidence of dilitation. Venous: The inferior vena cava is dilated in size with less than 50% respiratory variability, suggesting right atrial pressure of 15 mmHg. IAS/Shunts: No atrial level shunt detected by color flow Doppler.  LEFT VENTRICLE PLAX 2D LVIDd:         4.60 cm  Diastology LVIDs:         3.20 cm     LV e' medial:    8.59 cm/s LV PW:         0.90 cm     LV E/e' medial:  11.3 LV IVS:        0.80 cm     LV e' lateral:   14.20 cm/s LVOT diam:     2.00 cm     LV E/e' lateral: 6.8 LV SV:         91 LV SV Index:   45 LVOT Area:     3.14 cm  LV Volumes (MOD) LV vol d, MOD A2C: 97.6 ml LV vol d, MOD A4C: 95.9 ml LV vol s, MOD A2C: 39.2 ml LV vol s, MOD A4C: 40.1 ml LV SV MOD A2C:     58.4 ml LV SV MOD A4C:     95.9 ml LV SV MOD BP:      60.4 ml RIGHT VENTRICLE             IVC RV Basal diam:  4.50 cm     IVC diam: 2.90 cm RV S prime:     15.00 cm/s TAPSE (M-mode): 2.8 cm LEFT ATRIUM              Index        RIGHT ATRIUM           Index LA diam:        3.90 cm 1.92 cm/m   RA Area:     19.90 cm LA Vol (A2C):   51.4 ml 25.28 ml/m  RA Volume:   61.00 ml  30.00 ml/m LA Vol (A4C):   60.8 ml 29.90 ml/m LA Biplane Vol: 57.2 ml 28.13 ml/m  AORTIC VALVE AV Area (Vmax):    3.03 cm AV Area (Vmean):   3.01 cm AV Area (VTI):     3.00 cm AV Vmax:           131.50 cm/s AV Vmean:          86.150 cm/s AV VTI:            0.304 m AV Peak Grad:      6.9 mmHg AV Mean Grad:      3.5 mmHg LVOT Vmax:         127.00 cm/s LVOT Vmean:        82.550 cm/s LVOT VTI:          0.290 m LVOT/AV VTI ratio: 0.96  AORTA Ao Root diam: 3.20 cm Ao Asc diam:  3.00 cm MITRAL VALVE               TRICUSPID VALVE MV Area (PHT): 3.77 cm    TR Peak grad:   33.9 mmHg MV Area VTI:   2.86 cm    TR Vmax:        291.00 cm/s MV Peak grad:  5.2 mmHg MV Mean grad:  2.0 mmHg    SHUNTS MV Vmax:       1.14 m/s    Systemic VTI:  0.29 m MV Vmean:      65.6 cm/s   Systemic Diam: 2.00 cm MV Decel Time: 201 msec MV E velocity: 96.70 cm/s MV A velocity: 59.50 cm/s MV E/A ratio:  1.63 Lindsay [name] Electronically signed by Lindsay [name] Signature Date/Time: 05/23/2023/11:21:06 AM    Final    EEG adult  Result Date: 05/23/2023 Lindsay [name]  Val Eagle, Lindsay [name]     05/23/2023  8:40 AM Patient Name: Lindsay [name] MRN: [id number] Epilepsy Attending: Charlsie Quest Referring Physician/Provider: Harolyn Rutherford, Lindsay [name] Date:05/22/2023 Duration: 21.25 mins Patient history:  62 y.o. female presenting after syncopal episode of unclear etiology. Pt has non-focal neurologic exam with somewhat depressed level of consciousness and CT demonstrates traumatic pattern of right frontal hemorrhage although there is some SAH primarily in the right Sylvian fissure. EEG to evaluate for seizure. Level of alertness: Awake, drowsy AEDs during EEG study: LEV Technical aspects: This EEG study was done with scalp electrodes positioned according to the 10-20 International system  of electrode placement. Electrical activity was reviewed with band Caster filter of 1-70Hz , sensitivity of 7 uV/mm, display speed of 60mm/sec with a 60Hz  notched filter applied as appropriate. EEG data were recorded continuously and digitally stored.  Video monitoring was available and reviewed as appropriate. Description: The posterior dominant rhythm consists of 8-9 Hz activity of moderate voltage (25-35 uV) seen predominantly in posterior head regions, symmetric and reactive to eye opening and eye closing. Drowsiness was characterized by attenuation of the posterior background rhythm. Hyperventilation and photic stimulation were not performed.   IMPRESSION: This study is within normal limits. No seizures or epileptiform discharges were seen throughout the recording. A normal interictal EEG does not exclude the diagnosis of epilepsy. Charlsie Quest    Cardiac Studies   2d echo 05/23/23    1. Left ventricular ejection fraction, by estimation, is 60 to 65%. The  left ventricle has normal function. The left ventricle has no regional  wall motion abnormalities. Left ventricular diastolic parameters were  normal.   2. Right ventricular systolic function is normal. The right ventricular  size is moderately enlarged. There is moderately elevated pulmonary artery  systolic pressure. The estimated right ventricular systolic pressure is  48.9 mmHg.   3. The mitral valve is normal in structure. No evidence of mitral valve  regurgitation. No evidence of mitral stenosis.   4. The aortic valve is tricuspid. Aortic valve regurgitation is not  visualized. No aortic stenosis is present.   5. The inferior vena cava is dilated in size with <50% respiratory  variability, suggesting right atrial pressure of 15 mmHg.   Comparison(s): No significant change from prior study. Prior images  reviewed side by side.   Patient Profile     62 y.o. 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 Patient reported to be in SVT via EMS (verified by run sheet), received adenosine, then diltiazem and converted to NSR. Developed recurrent SVT on 6/13, started on IV amiodarone with quiescence. Cardiology consulted for syncope and SVT.  Assessment & Plan    1. Syncope with head injury and ICH - etiology unclear - echo shows normal LVEF, would not typically expect SVT to cause someone to Rupp[place] out unless they were dehydrated or compromised in some other way at the same time - CTA ruled out PE - will need event monitor at DC and potentially ILR if this is unrevealing   2. PSVT with occasional PACs, PVCs also noted on telemetry - remains on amio gtt at 30mg /hr, rhythm predominantly NSR with occasional PACs/PVCs on telemetry - will review plan with Lindsay [name] - lyte management as per #4 - check baseline TSH - SBP looks as though it will tolerate readdition of AVN blocking agent though not certain of  BP goal with intracranial hemorrhage -? Diltiazem given pt's hx of coronary vasospasm - addendum: per d/w Dr. Bjorn Pippin, will stop IV amiodarone and start oral diltiazem 30mg  q6hr. TSH suppressed at 0.345, will add free T4 and total T3, otherwise management per primary team  3. Right ventricular enlargement with moderately elevated PASP - CTA neg for PE - consider outpatient eval for OSA, no signs of RHC on exam - Will need to monitor volume status while receiving hypertonic saline  4. Hypokalemia, hypernatremia, hyperchloremia, hypocalcemia - lyte management per primary team - recommend keeping K 4.0 or greater and Mg 2.0 or greater  5. Coronary calcification/aortic atherosclerosis by CT scan, h/o coronary vasospasm - no ASA given ICH - no anginal symptoms - consider statin at DC for LDL 106  Remainder of medical issues including ancillary CT findings per primary team  For questions  or updates, please contact  [facility] Please consult [email or web address] for contact info under Cardiology/STEMI.  Signed, Laurann Montana, PA-C 05/25/2023, 8:24 AM     Patient seen and examined.  Agree with above documentation.  On exam, patient is somnolent but arousable, oriented to person only, no murmurs, diminished breath sounds, no lower extremity edema, + JVD.  No further episodes of SVT.  Given history of coronary vasospasm, would favor starting diltiazem both to prevent vasospasm and prevent SVT.  Will stop amiodarone drip and start diltiazem 30 mg every 6 hours.  She is starting to appear mildly volume overloaded, has JVD on exam, will need to monitor volume status receiving hypertonic saline for her cerebral edema.  Little [place], Lindsay [name]

## 2023-05-25 NOTE — Progress Notes (Addendum)
STROKE TEAM PROGRESS NOTE   INTERVAL HISTORY No family at bedside.  Sitting in the chair in no apparent distress more awake and alert today.  She is on 3% saline at 75 cc, last sodium 150.  Will continue 3% today.  Will repeat CT head in the a.m. and if looks stable we will plan to taper 3% tomorrow  Vitals:   05/25/23 1100 05/25/23 1144 05/25/23 1200 05/25/23 1300  BP: 139/66 108/82 123/67 (!) 132/107  Pulse: 69 81 72 72  Resp: (!) 21 (!) 23 16 (!) 23  Temp:   98.8 F (37.1 C)   TempSrc:   Axillary   SpO2: 100% 100% 100% 100%  Weight:      Height:       CBC:  Recent Labs  Lab 05/22/23 1102 05/22/23 1114 05/23/23 0215  WBC 6.1  --  9.3  HGB 12.8 13.9 12.9  HCT 40.5 41.0 40.6  MCV 77.4*  --  76.9*  PLT 226  --  198    Basic Metabolic Panel:  Recent Labs  Lab 05/24/23 0242 05/24/23 0827 05/25/23 0322 05/25/23 0942  NA 140   < > 150* 150*  K 3.1*   < > 3.0* 3.5  CL 107   < > 122* 123*  CO2 21*   < > 18* 18*  GLUCOSE 116*   < > 135* 164*  BUN <5*   < > 6* 8  CREATININE 0.50   < > 0.61 0.69  CALCIUM 8.7*   < > 8.1* 8.2*  MG 1.9  --  1.9  --    < > = values in this interval not displayed.   Lipid Panel:  Recent Labs  Lab 05/23/23 0215  CHOL 197  TRIG 50  HDL 81  CHOLHDL 2.4  VLDL 10  LDLCALC 161*    HgbA1c:  Recent Labs  Lab 05/22/23 1805  HGBA1C 5.7*    Urine Drug Screen:  Recent Labs  Lab 05/22/23 1103  LABOPIA NONE DETECTED  COCAINSCRNUR NONE DETECTED  LABBENZ NONE DETECTED  AMPHETMU NONE DETECTED  THCU NONE DETECTED  LABBARB NONE DETECTED     Alcohol Level  Recent Labs  Lab 05/22/23 1101  ETH <10     IMAGING past 24 hours No results found.  PHYSICAL EXAM  Temp:  [98 F (36.7 C)-100.1 F (37.8 C)] 98.8 F (37.1 C) (06/14 1200) Pulse Rate:  [64-104] 72 (06/14 1300) Resp:  [13-36] 23 (06/14 1300) BP: (108-169)/(55-122) 132/107 (06/14 1300) SpO2:  [100 %] 100 % (06/14 1300) Arterial Line BP: (143-193)/(47-88) 172/73 (06/14  1300) Weight:  [92.4 kg] 92.4 kg (06/14 0500)  General - Well nourished, well developed, in no apparent distress  Mental Status -  Level of arousal and orientation to time, place, and person were intact. Language including expression, naming, repetition, comprehension was assessed and found intact. Attention span and concentration were normal, Recent and remote memory were intact.  Cranial Nerves II - XII - II - Visual field intact OU. III, IV, VI - Extraocular movements intact. V - Facial sensation intact bilaterally. VII -left facial droop VIII - Hearing & vestibular intact bilaterally. X - Palate elevates symmetrically. XI - Chin turning & shoulder shrug intact bilaterally. XII - Tongue protrusion intact.  Motor Strength -left-sided hemiparesis with drift on left side bulk was normal and fasciculations were absent.   Motor Tone - Muscle tone was assessed at the neck and appendages and was normal. Sensory - Light touch, temperature/pinprick  were assessed and were symmetrical.   Coordination - The patient had normal movements in the hands and feet with no ataxia or dysmetria.  Tremor was absent.  Gait and Station - deferred.   ASSESSMENT/PLAN Lindsay [name] is a 62 y.o. 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  She was brought to the ER and was found to have right frontal lobe hemorrhage with 4 mm shift with edema and some subarachnoid blood.  Neurosurgery consulted by ER physician.  Placed on Keppra 500 twice daily.  Routine EEG negative.  On exam today patient has no focal weakness or confusion.  Does endorse frontal headache, Fioricet added as needed as Tylenol given prior did not help.  ICH - traumatic with right frontal contusion and acute occipital skull fracture, type of contrecoup injury Rebleeding - right frontal ICH enlarged, etiology unclear CT head:.  Right frontal lobe  hemorrhage with surrounding edema.  4 mm right to left midline shift.  Additional subdural and subarachnoid hemorrhages along the right frontal convexity. CTA head & neck: No LVO or significant stenosis in head or neck. Repeat CT head: Scattered posttraumatic subarachnoid hemorrhage with superimposed hemorrhagic contusions in the anterior right frontal convexity, slightly increased.  Mild mild mass effect with no more than trace right to left shift, stable. Evolving left posterior scalp contusion with laceration. Underlying acute nondisplaced nondepressed occipital skull fracture Repeat CT 6/12 PM: Increase size of hemorrhagic contusion involving anterior right frontal lobe, now measuring 5 x 5 x 3 0.2 x 3.3 cm. Worsening surrounding vasogenic edema and regional mass effect, 5 mm right to left shift now seen. Repeat CT head 6/13 a.m: Stable hemorrhagic contusion with edema.  Stable mass effect with leftward midline shift of 5 to 6 mm.  Mild SAH. Repeat CT head tomorrow 2D Echo: EF 60 to 65%, no significant change from prior study. LDL 106 HgbA1c 5.7 Ammonia 15 VTE prophylaxis - SCDs No antithrombotic prior to admission, now on No antithrombotic due to ICH On keppra 500 bid for 7 days Therapy recommendations:  CIR Disposition:  pending, NSG on board, no intervention  ? Syncope  Recurrent SVT Per family, patient was in standing position, made a turn and fall to the ground. No clear mechanical fall Patient has following with Dr.Acharya cardiology (last seen 10/23) Hx of coronary artery vasospasm, Diagnosed with left heart cath in 2019, started on amlodipine 2D echo EF 60 to 65%. Cardiology consulted Off amiodarone IV -> on cardizem po 30mg  Q6 Recommend heart monitoring as outpatient  HA Cerebral edema Likely related to current ICH and skull fracture No history of migraine On 3% saline @ 75 Na 136->140->142->145->150 Consider tapering 3% tomorrow morning after repeat CT head is stable Na  monitoring, goal 150-155  Hypertension Home meds:  none Stable SBP goal <160  Long-term BP goal normotensive  Hyperlipidemia Home meds:  none LDL 106, goal < 70 Consider to add statin at discharge  Diabetes type II, controlled Home meds:  [place] HgbA1c 5.7, goal < 7.0 CBGs SSI Outpatient PCP follow-up  Other Stroke Risk Factors Former Cigarette smoker Obesity, Body mass index is 31.43 kg/m., BMI >/= 30 associated with increased stroke risk, recommend weight loss, diet and exercise as appropriate   Other Active Problems Hx of Chronic Pain Gabapentin, Skelaxin, Xanaflex home meds  Hospital day # 3  Pt seen by Neuro NP/APP and later by MD. Note/plan to  be edited by MD as needed.    Lindsay Mart DNP, ACNPC-AG  [facility]  ATTENDING NOTE: I reviewed above note and agree with the assessment and plan. Pt was seen and examined.   Pt sitting in chair, awake, alert, eyes open, orientated to age, place, time. No aphasia, fluent language, following all simple commands. Able to name and repeat. No gaze palsy, tracking bilaterally, visual field full. Slight left facial droop. Tongue midline. RUE 5/5 and LUE 4+/5 with slight drift. Bilaterally LEs 4/5, no drift. Sensation symmetrical bilaterally, b/l FTN intact, gait not tested.   Pt mental status has improved. Off amiodarone drip and now on cardizem po. Repeat CT tomorrow. If stable and mental status continue to improve, will start to taper off 3% saline. Recommend outpt heart monitoring. Will follow  For detailed assessment and plan, please refer to above/below as I have made changes wherever appropriate.   Marvel Plan, MD PhD Stroke Neurology 05/25/2023 9:11 PM  This patient is critically ill due to ICH, ICH extension, skull fracture, SVT, cerebral edema and at significant risk of neurological worsening, death form brain herniation, heart failure, hematoma expansion. This patient's care requires constant monitoring  of vital signs, hemodynamics, respiratory and cardiac monitoring, review of multiple databases, neurological assessment, discussion with family, other specialists and medical decision making of high complexity. I spent 35 minutes of neurocritical care time in the care of this patient.   To contact Stroke Continuity provider, please refer to [email or web address]. After hours, contact General Neurology

## 2023-05-26 ENCOUNTER — Inpatient Hospital Stay (HOSPITAL_COMMUNITY): Payer: 59

## 2023-05-26 ENCOUNTER — Other Ambulatory Visit: Payer: Self-pay | Admitting: Cardiology

## 2023-05-26 DIAGNOSIS — Z66 Do not resuscitate: Secondary | ICD-10-CM | POA: Diagnosis not present

## 2023-05-26 DIAGNOSIS — I471 Supraventricular tachycardia, unspecified: Secondary | ICD-10-CM | POA: Diagnosis not present

## 2023-05-26 DIAGNOSIS — R55 Syncope and collapse: Secondary | ICD-10-CM

## 2023-05-26 DIAGNOSIS — Z23 Encounter for immunization: Secondary | ICD-10-CM | POA: Diagnosis not present

## 2023-05-26 DIAGNOSIS — G9341 Metabolic encephalopathy: Secondary | ICD-10-CM | POA: Diagnosis not present

## 2023-05-26 DIAGNOSIS — S061X9A Traumatic cerebral edema with loss of consciousness of unspecified duration, initial encounter: Secondary | ICD-10-CM | POA: Diagnosis not present

## 2023-05-26 DIAGNOSIS — I629 Nontraumatic intracranial hemorrhage, unspecified: Secondary | ICD-10-CM | POA: Diagnosis not present

## 2023-05-26 LAB — MAGNESIUM
Magnesium: 2 mg/dL (ref 1.7–2.4)
Magnesium: 1.7 mg/dL (ref 1.7–2.4)

## 2023-05-26 LAB — GLUCOSE, CAPILLARY
Glucose-Capillary: 106 mg/dL — ABNORMAL HIGH (ref 70–99)
Glucose-Capillary: 103 mg/dL — ABNORMAL HIGH (ref 70–99)
Glucose-Capillary: 154 mg/dL — ABNORMAL HIGH (ref 70–99)
Glucose-Capillary: 95 mg/dL (ref 70–99)

## 2023-05-26 LAB — BASIC METABOLIC PANEL
Anion gap: 14 (ref 5–15)
BUN: <5 mg/dL — ABNORMAL LOW (ref 8–23)
CO2: 18 mmol/L — ABNORMAL LOW (ref 22–32)
Calcium: 7.5 mg/dL — ABNORMAL LOW (ref 8.9–10.3)
Chloride: 127 mmol/L — ABNORMAL HIGH (ref 98–111)
Creatinine, Ser: 0.48 mg/dL (ref 0.44–1.00)
GFR, Estimated: >60 mL/min (ref >60)
Glucose, Bld: 91 mg/dL (ref 70–99)
Potassium: 2.5 mmol/L — CL (ref 3.5–5.1)
Sodium: 159 mmol/L — ABNORMAL HIGH (ref 135–145)

## 2023-05-26 LAB — CBC
HCT: 31.1 % — ABNORMAL LOW (ref 36.0–46.0)
Hemoglobin: 10.1 g/dL — ABNORMAL LOW (ref 12.0–15.0)
MCH: 25 pg — ABNORMAL LOW (ref 26.0–34.0)
MCHC: 32.5 g/dL (ref 30.0–36.0)
MCV: 77 fL — ABNORMAL LOW (ref 80.0–100.0)
Platelets: 177 10*3/uL (ref 150–400)
RBC: 4.04 MIL/uL (ref 3.87–5.11)
RDW: 20.8 % — ABNORMAL HIGH (ref 11.5–15.5)
WBC: 5.2 10*3/uL (ref 4.0–10.5)
nRBC: 0 % (ref 0.0–0.2)

## 2023-05-26 LAB — POCT I-STAT 7, (LYTES, BLD GAS, ICA,H+H)
Acid-base deficit: 2 mmol/L (ref 0.0–2.0)
Bicarbonate: 19.3 mmol/L — ABNORMAL LOW (ref 20.0–28.0)
Calcium, Ion: 1.16 mmol/L (ref 1.15–1.40)
HCT: 30 % — ABNORMAL LOW (ref 36.0–46.0)
Hemoglobin: 10.2 g/dL — ABNORMAL LOW (ref 12.0–15.0)
O2 Saturation: 99 %
Patient temperature: 98.1
Potassium: 2.9 mmol/L — ABNORMAL LOW (ref 3.5–5.1)
Sodium: 146 mmol/L — ABNORMAL HIGH (ref 135–145)
TCO2: 20 mmol/L — ABNORMAL LOW (ref 22–32)
pCO2 arterial: 21.2 mmHg — ABNORMAL LOW (ref 32–48)
pH, Arterial: 7.568 — ABNORMAL HIGH (ref 7.35–7.45)
pO2, Arterial: 129 mmHg — ABNORMAL HIGH (ref 83–108)

## 2023-05-26 LAB — T3: T3, Total: 57 ng/dL — ABNORMAL LOW (ref 71–180)

## 2023-05-26 LAB — SODIUM
Sodium: 152 mmol/L — ABNORMAL HIGH (ref 135–145)
Sodium: 150 mmol/L — ABNORMAL HIGH (ref 135–145)
Sodium: 156 mmol/L — ABNORMAL HIGH (ref 135–145)

## 2023-05-26 MED ORDER — GABAPENTIN 300 MG PO CAPS
300.0000 mg | ORAL_CAPSULE | Freq: Two times a day (BID) | ORAL | Status: DC
  Administered 2023-05-26 – 2023-05-27 (×4): 300 mg via ORAL
  Filled 2023-05-26 (×4): qty 1

## 2023-05-26 MED ORDER — MAGNESIUM SULFATE 2 GM/50ML IV SOLN
2.0000 g | Freq: Once | INTRAVENOUS | Status: AC
  Administered 2023-05-26: 2 g via INTRAVENOUS
  Filled 2023-05-26: qty 50

## 2023-05-26 MED ORDER — POTASSIUM CHLORIDE 10 MEQ/100ML IV SOLN
10.0000 meq | INTRAVENOUS | Status: AC
  Administered 2023-05-26 – 2023-05-27 (×8): 10 meq via INTRAVENOUS
  Filled 2023-05-26 (×2): qty 100

## 2023-05-26 MED ORDER — DILTIAZEM HCL ER COATED BEADS 120 MG PO CP24
120.0000 mg | ORAL_CAPSULE | Freq: Every day | ORAL | Status: DC
  Administered 2023-05-26 – 2023-05-27 (×2): 120 mg via ORAL
  Filled 2023-05-26 (×4): qty 1

## 2023-05-26 MED ORDER — SENNA 8.6 MG PO TABS
2.0000 | ORAL_TABLET | Freq: Every day | ORAL | Status: DC
  Administered 2023-05-27: 17.2 mg via ORAL
  Filled 2023-05-26: qty 2

## 2023-05-26 MED ORDER — DILTIAZEM HCL 25 MG/5ML IV SOLN
1.0000 mg | Freq: Once | INTRAVENOUS | Status: DC
  Filled 2023-05-26: qty 5

## 2023-05-26 MED ORDER — LEVETIRACETAM 500 MG PO TABS
500.0000 mg | ORAL_TABLET | Freq: Two times a day (BID) | ORAL | Status: DC
  Administered 2023-05-26 – 2023-05-27 (×3): 500 mg via ORAL
  Filled 2023-05-26 (×3): qty 1

## 2023-05-26 MED ORDER — FLUOCINONIDE 0.05 % EX CREA
TOPICAL_CREAM | Freq: Two times a day (BID) | CUTANEOUS | Status: DC | PRN
  Filled 2023-05-26: qty 15

## 2023-05-26 MED ORDER — DILTIAZEM LOAD VIA INFUSION: 10.0000 mg | Freq: Once | INTRAVENOUS | Status: DC

## 2023-05-26 NOTE — Progress Notes (Signed)
eLink Physician-Brief Progress Note Patient Name: Lindsay [name] DOB: [birth year] MRN: [id number]   Date of Service  05/26/2023  HPI/Events of Note  Serum sodium check.  eICU Interventions  Na+ 149.        Dayrin Stallone U Rebel Willcutt 05/26/2023, 3:12 AM

## 2023-05-26 NOTE — Progress Notes (Signed)
NAME:  Lindsay [name], MRN:  [id number], DOB:  1961/04/05, LOS: 4 ADMISSION DATE:  05/22/2023, CONSULTATION DATE:  6/11 REFERRING MD:  Dr. Elpidio Anis, CHIEF COMPLAINT:  Head bleed   History of Present Illness:  Patient is a 62 yo Female w/ pertinent PMH coronary artery vasospasm, DMT2, HTN presents to [facility] ED on 6/11 w/ head bleed.  On 6/11, patient was at work and had syncopal episode falling and hit back of head. Not on any blood thinners at home. EMS called. Found patient HR 160s and was given 6 mg adenosine then given 20 mg cardizem converting back to sinus rhythm.   Upon arrival to [facility] ED, patient hemodynamically stable and afebrile. EKG sinus rhythm. CT head showing hemorrhage in the anterior right frontal lobe, likely contusion, with surrounding edema; Additional SDH and SAH along the right frontal convexity, with 4 mm right-to-left midline shift. NSG consulted recommending SBP goal <150 and Keppra 500 bid. Recommend ICU admission for close neuro monitoring. PCCM consulted.  Pertinent ED labs: ethanol and UDS wnl, trop 9, K 3.1, mag 1.5, glucose 177 CTA chest with no PE; large hiatal hernia; b/l adrenal nodules possible adenomas.  Pertinent  Medical History   Past Medical History:  Diagnosis Date   Allergic rhinitis, cause unspecified    seasonal   Anginal pain (HCC) 2013   Stress   Arthritis    hips, thumb,knees   Diabetes mellitus without complication (HCC)    Essential hypertension, benign 12/12/2011   Joint pain    Multiple drug allergies    Obesity    Wears glasses    Wears partial dentures    upper     Significant Hospital Events: Including procedures, antibiotic start and stop dates in addition to other pertinent events   6/11 syncopal episode then hit head; ct head with head bleed and midline shift  Interim History / Subjective:   Having a headache No other overnight events Heart rate controlled-off amiodarone at present 3% saline rate decreased  Objective    Blood pressure 129/81, pulse 73, temperature 98.6 F (37 C), temperature source Oral, resp. rate 12, height 5' 7.5" (1.715 m), weight 100.4 kg, last menstrual period 03/26/2012, SpO2 100 %.        Intake/Output Summary (Last 24 hours) at 05/26/2023 0955 Last data filed at 05/26/2023 0600 Gross per 24 hour  Intake 2008.79 ml  Output 650 ml  Net 1358.79 ml   Filed Weights   05/24/23 0500 05/25/23 0500 05/26/23 0500  Weight: 90.6 kg 92.4 kg 100.4 kg    Examination: General: Elderly lady, does not appear to be in distress, appears comfortable in bed HEENT: Moist oral mucosa Neuro: Moving all extremities, appropriate, following commands CV: S1-S2 appreciated with no murmur PULM: Clear breath sounds bilaterally GI: soft, NT Extremities: no cyanosis or edema Skin: Skin is warm and dry  Sodium 152, potassium 3.5-  Echo : LVEF 60-65%, no RWMA. RV normal function. Moderately elevated PASP. Dilated IVC with reduced variability.   CT head:1. Stable since 05/24/2023: Ongoing extensive right anterior frontal lobe hemorrhagic contusion with edema and mass effect. Smaller hemorrhagic contusions in the left inferior frontal gyrus and right anterior temporal pole. And small volume anterior right frontal lobe extra-axial blood. Stable intracranial mass effect with effaced right lateral ventricle and 5 mm of leftward midline shift. 2. There is an associated non-depressed left skull base fracture tracking from the occipital condyle toward the occipital protuberance.  3. No new intracranial abnormality.  Resolved  Hospital Problem list   Lactic acidosis  Assessment & Plan:   Syncopal episode -Dr. Chestine Spore did previously discuss syncope precautions with her and her son & daughter- no driving x 6 months and avoid high risk activities without supervision-- ladders, swimming, etc.  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 and subarachnoid hemorrhage Small posterior scalp laceration -Management per neurosurgery and neurology -Continue neuromonitoring -Continue 3% saline -Goal systolic blood pressure less than 160 -Seizure prophylaxis with Keppra -Neuroprotective measures -Repeat CT head stable from 6/13  Prediabetes -Continue SSI  Urinary retention -Continue Flomax  Hypertension Coronary vasospasm -Home antihypertensives on hold  History of peripheral neuropathy -Gabapentin on hold  GERD -Continue PPI  Adrenal nodules -Will need outpatient follow-up   Best Practice (right click and "Reselect all SmartList Selections" daily)   Diet/type: NPO DVT prophylaxis: SCD GI prophylaxis: PPI Lines: Arterial Line Foley:  N/A Code Status:  full code Last date of multidisciplinary goals of care discussion [6/12 updated daughter and son at bedside]  Labs   CBC: Recent Labs  Lab 05/22/23 1102 05/22/23 1114 05/23/23 0215 05/26/23 0400  WBC 6.1  --  9.3 5.2  HGB 12.8 13.9 12.9 10.1*  HCT 40.5 41.0 40.6 31.1*  MCV 77.4*  --  76.9* 77.0*  PLT 226  --  198 177    Basic Metabolic Panel: Recent Labs  Lab 05/22/23 1102 05/22/23 1114 05/23/23 0215 05/24/23 0242 05/24/23 0827 05/24/23 1122 05/25/23 0322 05/25/23 0942 05/25/23 1619 05/25/23 2150 05/26/23 0400  NA 138   < > 136 140 142   < > 150* 150* 148* 149* 152*  K 3.1*   < > 3.4* 3.1* 3.7  --  3.0* 3.5  --   --   --   CL 102   < > 97* 107 113*  --  122* 123*  --   --   --   CO2 22   < > 20* 21* 20*  --  18* 18*  --   --   --   GLUCOSE 177*   < > 137* 116* 120*  --  135* 164*  --   --   --   BUN 6*   < > 5* <5* <5*  --  6* 8  --   --   --   CREATININE 0.77   < > 0.74 0.50 0.55  --  0.61 0.69  --   --   --   CALCIUM 8.6*   < > 8.9 8.7* 8.5*  --  8.1* 8.2*  --   --   --   MG 1.5*  --  2.1 1.9  --   --  1.9  --   --   --  2.0   < > = values in this interval  not displayed.   The patient is critically ill with multiple organ systems failure and requires high complexity decision making for assessment and support, frequent evaluation and titration of therapies, application of advanced monitoring technologies and extensive interpretation of multiple databases. Critical Care Time devoted to patient care services described in this note independent of APP/resident time (if applicable)  is 30 minutes.   Virl Diamond MD [facility] Personal pager: See Amion If unanswered, please page CCM On-call: #[phone]

## 2023-05-26 NOTE — Progress Notes (Signed)
Notified of heart rate in 170s  Cardizem 10 mg IV ordered

## 2023-05-26 NOTE — Progress Notes (Addendum)
Checked on patient  Has been lethargic She does attempt to open eyes to name calling but not as responsive as she was yesterday  CT report of this morning reviewed again-stable from previous  ABG ordered  ABG 7.56/21/129-bicarb 19.3  Check BMP

## 2023-05-26 NOTE — Progress Notes (Signed)
1245- Patient ambulated to bathroom with assist. HR elevated to low 200s. SBP 155. Given 10mg  labetalol at 1255. HR decreased to 120-150s.  1345- HR remains 120-150. Cardiology notified via AMION.   1412- HR 70s. Laverda Page, NP aware via secure chat. Will continue to monitor.

## 2023-05-26 NOTE — Progress Notes (Addendum)
eLink Physician-Brief Progress Note Patient Name: LUDENE HORGEN DOB: July 02, 1961 MRN: [id number]   Date of Service  05/26/2023  HPI/Events of Note  Camera; For ABG: showing metabolic acidosis and resp alkalosis with low Potassium. Mag was 2 in AM. No hypercarbia. ABG was done for somnolence. Making urine. On nasal o2. 3% NS. CTH stable ICB. Cerebral edema.  SVT stable. VS stable.    eICU Interventions  Replace hypokalemia Add on mag evel. Follow ABG after 2 hrs.  Asp precautions     Intervention Category Intermediate Interventions: Electrolyte abnormality - evaluation and management;Other:  Ranee Gosselin 05/26/2023, 8:37 PM  21:35 Mag replacement ordered  22:34 Sodium follow 159. Discussed with RN. Cutting back on 3 % saline per neurology advice.   04:13 Sodium at 150, at goal On replacements for low potassium. ABG reviewed Anemia stable. Continue current care.

## 2023-05-26 NOTE — Progress Notes (Signed)
Discussed taking prn Miralax with patient as she has not had a bowel movement since admit. Would not like to take it at this time.

## 2023-05-26 NOTE — Progress Notes (Signed)
Rounding Note    Patient Name: Lindsay [name] Date of Encounter: 05/26/2023   [facility] Cardiologist: Parke Poisson, MD   Subjective   Drowsy  Inpatient Medications    Scheduled Meds:  Chlorhexidine Gluconate Cloth  6 each Topical Daily   diltiazem  30 mg Oral Q6H   gabapentin  300 mg Oral BID   heparin injection (subcutaneous)  5,000 Units Subcutaneous Q8H   insulin aspart  0-6 Units Subcutaneous TID WC   lidocaine  1 patch Transdermal Q24H   pantoprazole (PROTONIX) IV  40 mg Intravenous QHS   sodium chloride flush  10-40 mL Intracatheter Q12H   tamsulosin  0.4 mg Oral Daily   Continuous Infusions:  sodium chloride Stopped (05/25/23 1301)   levETIRAcetam 400 mL/hr at 05/26/23 1000   sodium chloride (hypertonic) 38 mL/hr at 05/26/23 1000   PRN Meds: Place/Maintain arterial line **AND** sodium chloride, acetaminophen, butalbital-acetaminophen-caffeine, docusate sodium, hydrALAZINE, labetalol, mouth rinse, polyethylene glycol, sodium chloride flush   Vital Signs    Vitals:   05/26/23 0500 05/26/23 0600 05/26/23 0700 05/26/23 0800  BP: 134/64 (!) 145/73 132/67 129/81  Pulse: (!) 58 90 (!) 57 73  Resp: 20 13 19 12   Temp:    98.6 F (37 C)  TempSrc:    Oral  SpO2: 100% 100% 100% 100%  Weight: 100.4 kg     Height:        Intake/Output Summary (Last 24 hours) at 05/26/2023 1033 Last data filed at 05/26/2023 1000 Gross per 24 hour  Intake 2232.33 ml  Output 650 ml  Net 1582.33 ml      05/26/2023    5:00 AM 05/25/2023    5:00 AM 05/24/2023    5:00 AM  Last 3 Weights  Weight (lbs) 221 lb 5.5 oz 203 lb 11.3 oz 199 lb 11.8 oz  Weight (kg) 100.4 kg 92.4 kg 90.6 kg      Telemetry    NSR - Personally Reviewed  ECG    No new - Personally Reviewed  Physical Exam   Vitals:   05/26/23 0900 05/26/23 1000  BP:    Pulse: (!) 56 77  Resp: (!) 21 19  Temp:    SpO2: 100% 100%    GEN: No acute distress.   Neck: No JVD Cardiac: RRR, no murmurs,  rubs, or gallops.  Respiratory: Clear to auscultation bilaterally. GI: Soft, nontender, non-distended  MS: No edema; No deformity. Neuro:  Nonfocal  Psych: Normal affect   Labs    High Sensitivity Troponin:   Recent Labs  Lab 05/22/23 1102 05/22/23 1614  TROPONINIHS 9 9     Chemistry Recent Labs  Lab 05/22/23 1102 05/22/23 1114 05/24/23 0242 05/24/23 0827 05/24/23 1122 05/25/23 0322 05/25/23 0942 05/25/23 1619 05/25/23 2150 05/26/23 0400  NA 138   < > 140 142   < > 150* 150* 148* 149* 152*  K 3.1*   < > 3.1* 3.7  --  3.0* 3.5  --   --   --   CL 102   < > 107 113*  --  122* 123*  --   --   --   CO2 22   < > 21* 20*  --  18* 18*  --   --   --   GLUCOSE 177*   < > 116* 120*  --  135* 164*  --   --   --   BUN 6*   < > <5* <5*  --  6* 8  --   --   --   CREATININE 0.77   < > 0.50 0.55  --  0.61 0.69  --   --   --   CALCIUM 8.6*   < > 8.7* 8.5*  --  8.1* 8.2*  --   --   --   MG 1.5*   < > 1.9  --   --  1.9  --   --   --  2.0  PROT 6.6  --   --   --   --   --   --   --   --   --   ALBUMIN 3.8  --   --   --   --   --   --   --   --   --   AST 20  --   --   --   --   --   --   --   --   --   ALT 12  --   --   --   --   --   --   --   --   --   ALKPHOS 64  --   --   --   --   --   --   --   --   --   BILITOT 1.0  --   --   --   --   --   --   --   --   --   GFRNONAA >60   < > >60 >60  --  >60 >60  --   --   --   ANIONGAP 14   < > 12 9  --  10 9  --   --   --    < > = values in this interval not displayed.    Lipids  Recent Labs  Lab 05/23/23 0215  CHOL 197  TRIG 50  HDL 81  LDLCALC 106*  CHOLHDL 2.4    Hematology Recent Labs  Lab 05/22/23 1102 05/22/23 1114 05/23/23 0215 05/26/23 0400  WBC 6.1  --  9.3 5.2  RBC 5.23*  --  5.28* 4.04  HGB 12.8 13.9 12.9 10.1*  HCT 40.5 41.0 40.6 31.1*  MCV 77.4*  --  76.9* 77.0*  MCH 24.5*  --  24.4* 25.0*  MCHC 31.6  --  31.8 32.5  RDW 20.4*  --  20.5* 20.8*  PLT 226  --  198 177   Thyroid  Recent Labs  Lab  05/25/23 0942 05/25/23 1619  TSH 0.345*  --   FREET4  --  0.79    BNPNo results for input(s): "BNP", "PROBNP" in the last 168 hours.  DDimer No results for input(s): "DDIMER" in the last 168 hours.   Radiology    CT HEAD WO CONTRAST ( )  Result Date: 05/26/2023 CLINICAL DATA:  62 year old female status post fall striking head. Right greater than left frontotemporal hemorrhagic contusions with SH and intracranial mass effect. Subsequent encounter. EXAM: CT HEAD WITHOUT CONTRAST TECHNIQUE: Contiguous axial images were obtained from the base of the skull through the vertex without intravenous contrast. RADIATION DOSE REDUCTION: This exam was performed according to the departmental dose-optimization program which includes automated exposure control, adjustment of the mA and/or kV according to patient size and/or use of iterative reconstruction technique. COMPARISON:  Head CT 05/24/2023 and earlier. FINDINGS: Brain: Extensive hemorrhagic contusions in the anterior right frontal lobe with edema and mass effect.  Smaller left inferior frontal gyrus hemorrhagic contusion and edema. Small right anterior temporal hemorrhagic contusion and edema. Mild extra-axial hemorrhage along the right anterior frontal convexity appears stable. Intracranial mass effect, especially on the right lateral ventricle and midline shift of 5 mm appears stable since 05/24/2023. Stable relatively maintained basilar cistern patency. No ventriculomegaly. No new intracranial hemorrhage identified. Stable gray-white differentiation elsewhere. No cortically based acute infarct identified. Vascular: Calcified atherosclerosis at the skull base. No suspicious intracranial vascular hyperdensity. Skull: Stable. Nondisplaced left occipital bone fracture is identified and tracks to the left skull base and left occipital condyle. This is unchanged. No new osseous injury identified. Sinuses/Orbits: Stable sinus opacification. Tympanic cavities and  mastoids remain clear. Other: Left occiput scalp hematoma and skin staples. Ongoing Disconjugate gaze. IMPRESSION: 1. Stable since 05/24/2023: Ongoing extensive right anterior frontal lobe hemorrhagic contusion with edema and mass effect. Smaller hemorrhagic contusions in the left inferior frontal gyrus and right anterior temporal pole. And small volume anterior right frontal lobe extra-axial blood. Stable intracranial mass effect with effaced right lateral ventricle and 5 mm of leftward midline shift. 2. There is an associated non-depressed left skull base fracture tracking from the occipital condyle toward the occipital protuberance. 3. No new intracranial abnormality. Electronically Signed   By: Odessa Fleming M.D.   On: 05/26/2023 09:04   [place] EKG SITE RITE  Result Date: 05/24/2023 If Site Rite image not attached, placement could not be confirmed due to current cardiac rhythm.  CT HEAD WO CONTRAST ( )  Result Date: 05/24/2023 CLINICAL DATA:  62 year old female status post fall striking head. Intracranial hemorrhage. Subsequent encounter. EXAM: CT HEAD WITHOUT CONTRAST TECHNIQUE: Contiguous axial images were obtained from the base of the skull through the vertex without intravenous contrast. RADIATION DOSE REDUCTION: This exam was performed according to the departmental dose-optimization program which includes automated exposure control, adjustment of the mA and/or kV according to patient size and/or use of iterative reconstruction technique. COMPARISON:  Head CT 05/23/2023 and earlier. FINDINGS: Brain: Relatively large anterior right frontal lobe hemorrhagic contusion extending from the inferior through the middle frontal gyrus with confluent regional edema. Smaller left inferior frontal gyrus hemorrhagic contusion and edema. Mild right anterior temporal lobe hemorrhagic contusion and edema. Mass effect on the anterolateral ventricles with leftward midline shift of 5-6 mm is stable. No IVH or ventriculomegaly.  Basilar cisterns are patent. Trace subarachnoid hemorrhage elsewhere along the right hemisphere. No convincing SDH. Thalamic gray-white differentiation appears normal today. Stable gray-white matter differentiation throughout the brain. No cortically based acute infarct identified. Vascular: Calcified atherosclerosis at the skull base. Skull: No fracture identified. Sinuses/Orbits: Scattered paranasal sinus opacification and fluid levels, favor inflammatory. Tympanic cavities and mastoids remain clear. Other: Left posterior scalp skin staples. No new orbit or scalp soft tissue injury. IMPRESSION: 1. Stable right > left frontal and right temporal lobe hemorrhagic contusions with edema. Stable intracranial mass effect with leftward midline shift of 5-6 mm. Mild SAH. 2. No new intracranial abnormality. Electronically Signed   By: Odessa Fleming M.D.   On: 05/24/2023 11:02    Cardiac Studies   TTE 05/23/2023 1. Left ventricular ejection fraction, by estimation, is 60 to 65%. The  left ventricle has normal function. The left ventricle has no regional  wall motion abnormalities. Left ventricular diastolic parameters were  normal.   2. Right ventricular systolic function is normal. The right ventricular  size is moderately enlarged. There is moderately elevated pulmonary artery  systolic pressure. The estimated right ventricular systolic pressure  is  48.9 mmHg.   3. The mitral valve is normal in structure. No evidence of mitral valve  regurgitation. No evidence of mitral stenosis.   4. The aortic valve is tricuspid. Aortic valve regurgitation is not  visualized. No aortic stenosis is present.   5. The inferior vena cava is dilated in size with <50% respiratory  variability, suggesting right atrial pressure of 15 mmHg.   [facility] 11/11/2018 Conclusions: Mild luminal narrowing of the ostial LAD and distal LCx that improves with intracoronary nitroglycerin, suggestive of coronary vasospasm.  No severe stenosis noted  to account for chest pain at rest and mild troponin elevation. Normal left ventricular systolic function with mildly elevated filling pressure suggestive of diastolic dysfunction.   Recommendations: Stop nitroglycerin infusion and start isosorbide mononitrate for prevention of vasospasm. Consider evaluation for alternate causes of chest pain and mild troponin elevation, including pulmonary embolism. Secondary prevention of coronary artery disease.   Patient Profile     62 y.o. 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 Patient reported to be in SVT via EMS (verified by run sheet), received adenosine, then diltiazem and converted to NSR. Developed recurrent SVT on 6/13, started on IV amiodarone with quiescence. Cardiology consulted for syncope and SVT.   Assessment & Plan    SVT: in the setting of ICH Coronary Vasospasm ?Syncppe - in sinus rhythm - continue diltiazem 30 mg q6H , will consolidate to cardizem CD 120 mg daily - euvolemic - ordered outpatient cardiac monitor and FU - cardiology can follow peripherally  For questions or updates, please contact [facility] Please consult [email or web address] for contact info under        Signed, Maisie Fus, MD  05/26/2023, 10:33 AM

## 2023-05-26 NOTE — Progress Notes (Signed)
At 1545, patient HR 189 sustained. 10 mg labetalol given and cardiology and CCM paged. Dr Wynona Neat gave order for a one time dose of diltiazem which was held as HR came down to low 60's. Additionally, patient lethargic and  difficult to arouse but responding appropriately with stimulation.

## 2023-05-26 NOTE — Progress Notes (Addendum)
STROKE TEAM PROGRESS NOTE   INTERVAL HISTORY No family at bedside.  She is in bed awake and alert in NAD. She is c/o HA this morning. She is on 3% saline at 75 cc, last sodium 152.  CT head in the a.m. is stable. Will start to taper 3% saline today.  Neurological exam is stable and unchanged   Vitals:   05/26/23 0500 05/26/23 0600 05/26/23 0700 05/26/23 0800  BP: 134/64 (!) 145/73 132/67 129/81  Pulse: (!) 58 90 (!) 57 73  Resp: 20 13 19 12   Temp:    98.6 F (37 C)  TempSrc:    Oral  SpO2: 100% 100% 100% 100%  Weight: 100.4 kg     Height:       CBC:  Recent Labs  Lab 05/23/23 0215 05/26/23 0400  WBC 9.3 5.2  HGB 12.9 10.1*  HCT 40.6 31.1*  MCV 76.9* 77.0*  PLT 198 177    Basic Metabolic Panel:  Recent Labs  Lab 05/25/23 0322 05/25/23 0942 05/25/23 1619 05/25/23 2150 05/26/23 0400  NA 150* 150*   < > 149* 152*  K 3.0* 3.5  --   --   --   CL 122* 123*  --   --   --   CO2 18* 18*  --   --   --   GLUCOSE 135* 164*  --   --   --   BUN 6* 8  --   --   --   CREATININE 0.61 0.69  --   --   --   CALCIUM 8.1* 8.2*  --   --   --   MG 1.9  --   --   --  2.0   < > = values in this interval not displayed.   Lipid Panel:  Recent Labs  Lab 05/23/23 0215  CHOL 197  TRIG 50  HDL 81  CHOLHDL 2.4  VLDL 10  LDLCALC 161*    HgbA1c:  Recent Labs  Lab 05/22/23 1805  HGBA1C 5.7*    Urine Drug Screen:  Recent Labs  Lab 05/22/23 1103  LABOPIA NONE DETECTED  COCAINSCRNUR NONE DETECTED  LABBENZ NONE DETECTED  AMPHETMU NONE DETECTED  THCU NONE DETECTED  LABBARB NONE DETECTED     Alcohol Level  Recent Labs  Lab 05/22/23 1101  ETH <10     IMAGING past 24 hours CT HEAD WO CONTRAST ( )  Result Date: 05/26/2023 CLINICAL DATA:  62 year old female status post fall striking head. Right greater than left frontotemporal hemorrhagic contusions with SH and intracranial mass effect. Subsequent encounter. EXAM: CT HEAD WITHOUT CONTRAST TECHNIQUE: Contiguous axial  images were obtained from the base of the skull through the vertex without intravenous contrast. RADIATION DOSE REDUCTION: This exam was performed according to the departmental dose-optimization program which includes automated exposure control, adjustment of the mA and/or kV according to patient size and/or use of iterative reconstruction technique. COMPARISON:  Head CT 05/24/2023 and earlier. FINDINGS: Brain: Extensive hemorrhagic contusions in the anterior right frontal lobe with edema and mass effect. Smaller left inferior frontal gyrus hemorrhagic contusion and edema. Small right anterior temporal hemorrhagic contusion and edema. Mild extra-axial hemorrhage along the right anterior frontal convexity appears stable. Intracranial mass effect, especially on the right lateral ventricle and midline shift of 5 mm appears stable since 05/24/2023. Stable relatively maintained basilar cistern patency. No ventriculomegaly. No new intracranial hemorrhage identified. Stable gray-white differentiation elsewhere. No cortically based acute infarct identified. Vascular: Calcified atherosclerosis at  the skull base. No suspicious intracranial vascular hyperdensity. Skull: Stable. Nondisplaced left occipital bone fracture is identified and tracks to the left skull base and left occipital condyle. This is unchanged. No new osseous injury identified. Sinuses/Orbits: Stable sinus opacification. Tympanic cavities and mastoids remain clear. Other: Left occiput scalp hematoma and skin staples. Ongoing Disconjugate gaze. IMPRESSION: 1. Stable since 05/24/2023: Ongoing extensive right anterior frontal lobe hemorrhagic contusion with edema and mass effect. Smaller hemorrhagic contusions in the left inferior frontal gyrus and right anterior temporal pole. And small volume anterior right frontal lobe extra-axial blood. Stable intracranial mass effect with effaced right lateral ventricle and 5 mm of leftward midline shift. 2. There is an  associated non-depressed left skull base fracture tracking from the occipital condyle toward the occipital protuberance. 3. No new intracranial abnormality. Electronically Signed   By: Odessa Fleming M.D.   On: 05/26/2023 09:04    PHYSICAL EXAM  Temp:  [98.3 F (36.8 C)-99.7 F (37.6 C)] 98.6 F (37 C) (06/15 0800) Pulse Rate:  [56-106] 73 (06/15 0800) Resp:  [12-29] 12 (06/15 0800) BP: (108-159)/(53-117) 129/81 (06/15 0800) SpO2:  [97 %-100 %] 100 % (06/15 0800) Arterial Line BP: (157-190)/(65-87) 173/77 (06/15 0800) Weight:  [100.4 kg] 100.4 kg (06/15 0500)  General - Well nourished, well developed, in no apparent distress  Mental Status -  Level of arousal and orientation to time, place, and person were intact. Language including expression, naming, repetition, comprehension was assessed and found intact. Attention span and concentration were normal, Recent and remote memory were intact.  Cranial Nerves II - XII - II - Visual field intact OU. III, IV, VI - Extraocular movements intact. V - Facial sensation intact bilaterally. VII -left facial droop VIII - Hearing & vestibular intact bilaterally. X - Palate elevates symmetrically. XI - Chin turning & shoulder shrug intact bilaterally. XII - Tongue protrusion intact.  Motor Strength -left-sided hemiparesis with drift on left side bulk was normal and fasciculations were absent.   Motor Tone - Muscle tone was assessed at the neck and appendages and was normal. Sensory - Light touch, temperature/pinprick were assessed and were symmetrical.   Coordination - The patient had normal movements in the hands and feet with no ataxia or dysmetria.  Tremor was absent.  Gait and Station - deferred.   ASSESSMENT/PLAN JOSLYNN JOSHUA is a 62 y.o. 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  She was brought to the ER and was found to have right frontal lobe  hemorrhage with 4 mm shift with edema and some subarachnoid blood.  Neurosurgery consulted by ER physician.  Placed on Keppra 500 twice daily.  Routine EEG negative.  On exam today patient has no focal weakness or confusion.  Does endorse frontal headache, Fioricet added as needed as Tylenol given prior did not help.  ICH - traumatic with right frontal contusion and acute occipital skull fracture, type of contrecoup injury Rebleeding - right frontal ICH enlarged, etiology unclear CT head:.  Right frontal lobe hemorrhage with surrounding edema.  4 mm right to left midline shift.  Additional subdural and subarachnoid hemorrhages along the right frontal convexity. CTA head & neck: No LVO or significant stenosis in head or neck. Repeat CT head: Scattered posttraumatic subarachnoid hemorrhage with superimposed hemorrhagic contusions in the anterior right frontal convexity, slightly increased.  Mild mild mass effect with no more than trace right to left shift,  stable. Evolving left posterior scalp contusion with laceration. Underlying acute nondisplaced nondepressed occipital skull fracture Repeat CT 6/12 PM: Increase size of hemorrhagic contusion involving anterior right frontal lobe, now measuring 5 x 5 x 3 0.2 x 3.3 cm. Worsening surrounding vasogenic edema and regional mass effect, 5 mm right to left shift now seen. Repeat CT head 6/13 a.m: Stable hemorrhagic contusion with edema.  Stable mass effect with leftward midline shift of 5 to 6 mm.  Mild SAH. CT head stable  2D Echo: EF 60 to 65%, no significant change from prior study. LDL 106 HgbA1c 5.7 Ammonia 15 VTE prophylaxis - SCDs No antithrombotic prior to admission, now on No antithrombotic due to ICH On keppra 500 bid for 7 days Therapy recommendations:  CIR Disposition:  pending, NSG on board, no intervention  ? Syncope  Recurrent SVT Per family, patient was in standing position, made a turn and fall to the ground. No clear mechanical  fall Patient has following with Dr.Acharya cardiology (last seen 10/23) Hx of coronary artery vasospasm, Diagnosed with left heart cath in 2019, started on amlodipine 2D echo EF 60 to 65%. Cardiology consulted Off amiodarone IV -> on cardizem po 30mg  Q6 Recommend heart monitoring as outpatient  HA Cerebral edema Likely related to current ICH and skull fracture No history of migraine On 3% saline @ 75 Na 136->140->142->145->150->152 Start tapering 3% today Na monitoring, goal 150-155  Hypertension Home meds:  none Stable SBP goal <160  Long-term BP goal normotensive  Hyperlipidemia Home meds:  none LDL 106, goal < 70 Consider to add statin at discharge  Diabetes type II, controlled Home meds:  [place] HgbA1c 5.7, goal < 7.0 CBGs SSI Outpatient PCP follow-up  Other Stroke Risk Factors Former Cigarette smoker Obesity, Body mass index is 34.16 kg/m., BMI >/= 30 associated with increased stroke risk, recommend weight loss, diet and exercise as appropriate   Other Active Problems Hx of Chronic Pain Gabapentin, Skelaxin, Xanaflex home meds  Hospital day # 4  Pt seen by Neuro NP/APP and later by MD. Note/plan to be edited by MD as needed.    Gevena Mart DNP, ACNPC-AG  [facility]     I have personally obtained history,examined this patient, reviewed notes, independently viewed imaging studies, participated in medical decision making and plan of care.ROS completed by me personally and pertinent positives fully documented  I have made any additions or clarifications directly to the above note. Agree with note above. Patient was seen this morning, no acute overnight events noted. Her repeat CT Head this morning was stable and she is complaining of headaches.   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 This patient's care requires  constant monitoring of vital signs, hemodynamics, respiratory and cardiac monitoring, review of multiple databases, neurological assessment, discussion with family, other specialists and medical decision making of high complexity. I spent 30 minutes of neurocritical care time in the care of this patient.   Windell Norfolk, MD Neurology  Pager: [phone] 05/26/2023 12:59 PM    To contact Stroke Continuity provider, please refer to [email or web address]. After hours, contact General Neurology

## 2023-05-27 DIAGNOSIS — I629 Nontraumatic intracranial hemorrhage, unspecified: Secondary | ICD-10-CM

## 2023-05-27 LAB — COMPREHENSIVE METABOLIC PANEL
ALT: 27 U/L (ref 0–44)
ALT: 25 U/L (ref 0–44)
AST: 28 U/L (ref 15–41)
AST: 22 U/L (ref 15–41)
Albumin: 2.6 g/dL — ABNORMAL LOW (ref 3.5–5.0)
Albumin: 2.5 g/dL — ABNORMAL LOW (ref 3.5–5.0)
Alkaline Phosphatase: 46 U/L (ref 38–126)
Alkaline Phosphatase: 43 U/L (ref 38–126)
Anion gap: 7 (ref 5–15)
Anion gap: 8 (ref 5–15)
BUN: <5 mg/dL — ABNORMAL LOW (ref 8–23)
BUN: 5 mg/dL — ABNORMAL LOW (ref 8–23)
CO2: 21 mmol/L — ABNORMAL LOW (ref 22–32)
CO2: 20 mmol/L — ABNORMAL LOW (ref 22–32)
Calcium: 7.9 mg/dL — ABNORMAL LOW (ref 8.9–10.3)
Calcium: 7.7 mg/dL — ABNORMAL LOW (ref 8.9–10.3)
Chloride: 117 mmol/L — ABNORMAL HIGH (ref 98–111)
Chloride: 116 mmol/L — ABNORMAL HIGH (ref 98–111)
Creatinine, Ser: 0.61 mg/dL (ref 0.44–1.00)
Creatinine, Ser: 0.65 mg/dL (ref 0.44–1.00)
GFR, Estimated: >60 mL/min (ref >60)
GFR, Estimated: >60 mL/min (ref >60)
Glucose, Bld: 95 mg/dL (ref 70–99)
Glucose, Bld: 96 mg/dL (ref 70–99)
Potassium: 2.8 mmol/L — ABNORMAL LOW (ref 3.5–5.1)
Potassium: 3 mmol/L — ABNORMAL LOW (ref 3.5–5.1)
Sodium: 145 mmol/L (ref 135–145)
Sodium: 144 mmol/L (ref 135–145)
Total Bilirubin: 0.9 mg/dL (ref 0.3–1.2)
Total Bilirubin: 0.8 mg/dL (ref 0.3–1.2)
Total Protein: 5.6 g/dL — ABNORMAL LOW (ref 6.5–8.1)
Total Protein: 5.2 g/dL — ABNORMAL LOW (ref 6.5–8.1)

## 2023-05-27 LAB — POCT I-STAT 7, (LYTES, BLD GAS, ICA,H+H)
Acid-base deficit: 3 mmol/L — ABNORMAL HIGH (ref 0.0–2.0)
Bicarbonate: 19.7 mmol/L — ABNORMAL LOW (ref 20.0–28.0)
Calcium, Ion: 1.16 mmol/L (ref 1.15–1.40)
HCT: 31 % — ABNORMAL LOW (ref 36.0–46.0)
Hemoglobin: 10.5 g/dL — ABNORMAL LOW (ref 12.0–15.0)
O2 Saturation: 94 %
Patient temperature: 98.6
Potassium: 3.1 mmol/L — ABNORMAL LOW (ref 3.5–5.1)
Sodium: 150 mmol/L — ABNORMAL HIGH (ref 135–145)
TCO2: 21 mmol/L — ABNORMAL LOW (ref 22–32)
pCO2 arterial: 28.5 mmHg — ABNORMAL LOW (ref 32–48)
pH, Arterial: 7.447 (ref 7.35–7.45)
pO2, Arterial: 66 mmHg — ABNORMAL LOW (ref 83–108)

## 2023-05-27 LAB — MAGNESIUM
Magnesium: 2.4 mg/dL (ref 1.7–2.4)
Magnesium: 2 mg/dL (ref 1.7–2.4)

## 2023-05-27 LAB — CBC
HCT: 31.4 % — ABNORMAL LOW (ref 36.0–46.0)
Hemoglobin: 10 g/dL — ABNORMAL LOW (ref 12.0–15.0)
MCH: 24.4 pg — ABNORMAL LOW (ref 26.0–34.0)
MCHC: 31.8 g/dL (ref 30.0–36.0)
MCV: 76.8 fL — ABNORMAL LOW (ref 80.0–100.0)
Platelets: 180 10*3/uL (ref 150–400)
RBC: 4.09 MIL/uL (ref 3.87–5.11)
RDW: 21 % — ABNORMAL HIGH (ref 11.5–15.5)
WBC: 4.7 10*3/uL (ref 4.0–10.5)
nRBC: 0 % (ref 0.0–0.2)

## 2023-05-27 LAB — GLUCOSE, CAPILLARY
Glucose-Capillary: 80 mg/dL (ref 70–99)
Glucose-Capillary: 117 mg/dL — ABNORMAL HIGH (ref 70–99)
Glucose-Capillary: 133 mg/dL — ABNORMAL HIGH (ref 70–99)
Glucose-Capillary: 122 mg/dL — ABNORMAL HIGH (ref 70–99)

## 2023-05-27 MED ORDER — PANTOPRAZOLE SODIUM 40 MG PO TBEC
40.0000 mg | DELAYED_RELEASE_TABLET | Freq: Every day | ORAL | Status: DC
  Administered 2023-05-27: 40 mg via ORAL
  Filled 2023-05-27: qty 1

## 2023-05-27 MED ORDER — POTASSIUM CHLORIDE 20 MEQ PO PACK
40.0000 meq | PACK | Freq: Two times a day (BID) | ORAL | Status: AC
  Administered 2023-05-27 (×2): 40 meq via ORAL
  Filled 2023-05-27 (×2): qty 2

## 2023-05-27 NOTE — Progress Notes (Addendum)
STROKE TEAM PROGRESS NOTE   INTERVAL HISTORY No family at bedside.  Patient is awake and alert in the bed, complains of headache this morning 3% saline is being tapered and will be turned off around 930 this morning Neurological exam is stable and unchanged  Vitals:   05/27/23 0300 05/27/23 0400 05/27/23 0500 05/27/23 0800  BP: 137/68 135/63 136/62   Pulse: (!) 55 (!) 55 (!) 49   Resp: 16 18 17    Temp:  98.6 F (37 C)  98 F (36.7 C)  TempSrc:  Axillary  Axillary  SpO2: 99% 98% 98%   Weight:      Height:       CBC:  Recent Labs  Lab 05/26/23 0400 05/26/23 1843 05/27/23 0113 05/27/23 0521  WBC 5.2  --   --  4.7  HGB 10.1*   < > 10.5* 10.0*  HCT 31.1*   < > 31.0* 31.4*  MCV 77.0*  --   --  76.8*  PLT 177  --   --  180   < > = values in this interval not displayed.    Basic Metabolic Panel:  Recent Labs  Lab 05/27/23 0112 05/27/23 0113 05/27/23 0521  NA 145 150* 144  K 2.8* 3.1* 3.0*  CL 117*  --  116*  CO2 21*  --  20*  GLUCOSE 95  --  96  BUN <5*  --  5*  CREATININE 0.61  --  0.65  CALCIUM 7.9*  --  7.7*  MG 2.4  --  2.0   Lipid Panel:  Recent Labs  Lab 05/23/23 0215  CHOL 197  TRIG 50  HDL 81  CHOLHDL 2.4  VLDL 10  LDLCALC 244*    HgbA1c:  Recent Labs  Lab 05/22/23 1805  HGBA1C 5.7*    Urine Drug Screen:  Recent Labs  Lab 05/22/23 1103  LABOPIA NONE DETECTED  COCAINSCRNUR NONE DETECTED  LABBENZ NONE DETECTED  AMPHETMU NONE DETECTED  THCU NONE DETECTED  LABBARB NONE DETECTED     Alcohol Level  Recent Labs  Lab 05/22/23 1101  ETH <10     IMAGING past 24 hours No results found.  PHYSICAL EXAM  Temp:  [98 F (36.7 C)-99.7 F (37.6 C)] 98 F (36.7 C) (06/16 0800) Pulse Rate:  [49-188] 49 (06/16 0500) Resp:  [8-32] 17 (06/16 0500) BP: (105-155)/(62-121) 136/62 (06/16 0500) SpO2:  [96 %-100 %] 98 % (06/16 0500)  General - Well nourished, well developed, in no apparent distress  Mental Status -  Level of arousal and  orientation to time, place, and person were intact. Language including expression, naming, repetition, comprehension was assessed and found intact. Attention span and concentration were normal, Recent and remote memory were intact.  Cranial Nerves II - XII - II - Visual field intact OU. III, IV, VI - Extraocular movements intact. V - Facial sensation intact bilaterally. VII -left facial droop VIII - Hearing & vestibular intact bilaterally. X - Palate elevates symmetrically. XI - Chin turning & shoulder shrug intact bilaterally. XII - Tongue protrusion intact.  Motor Strength -left-sided hemiparesis with drift on left side bulk was normal and fasciculations were absent.   Motor Tone - Muscle tone was assessed at the neck and appendages and was normal. Sensory - Light touch, temperature/pinprick were assessed and were symmetrical.   Coordination - The patient had normal movements in the hands and feet with no ataxia or dysmetria.  Tremor was absent.  Gait and Station - deferred.  ASSESSMENT/PLAN Lindsay [name] is a 62 y.o. 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  She was brought to the ER and was found to have right frontal lobe hemorrhage with 4 mm shift with edema and some subarachnoid blood.  Neurosurgery consulted by ER physician.  Placed on Keppra 500 twice daily.  Routine EEG negative.  On exam today patient has no focal weakness or confusion.  Does endorse frontal headache, Fioricet added as needed as Tylenol given prior did not help.  ICH - traumatic with right frontal contusion and acute occipital skull fracture, type of contrecoup injury Rebleeding - right frontal ICH enlarged, etiology unclear CT head:.  Right frontal lobe hemorrhage with surrounding edema.  4 mm right to left midline shift.  Additional subdural and subarachnoid hemorrhages along the right frontal convexity. CTA head &  neck: No LVO or significant stenosis in head or neck. Repeat CT head: Scattered posttraumatic subarachnoid hemorrhage with superimposed hemorrhagic contusions in the anterior right frontal convexity, slightly increased.  Mild mild mass effect with no more than trace right to left shift, stable. Evolving left posterior scalp contusion with laceration. Underlying acute nondisplaced nondepressed occipital skull fracture Repeat CT 6/12 PM: Increase size of hemorrhagic contusion involving anterior right frontal lobe, now measuring 5 x 5 x 3 0.2 x 3.3 cm. Worsening surrounding vasogenic edema and regional mass effect, 5 mm right to left shift now seen. Repeat CT head 6/13 a.m: Stable hemorrhagic contusion with edema.  Stable mass effect with leftward midline shift of 5 to 6 mm.  Mild SAH. CT head stable  2D Echo: EF 60 to 65%, no significant change from prior study. LDL 106 HgbA1c 5.7 Ammonia 15 VTE prophylaxis - SCDs No antithrombotic prior to admission, now on No antithrombotic due to ICH On keppra 500 bid for 7 days Therapy recommendations:  CIR Disposition:  pending, NSG on board, no intervention  ? Syncope  Recurrent SVT Per family, patient was in standing position, made a turn and fall to the ground. No clear mechanical fall Patient has following with Dr.Acharya cardiology (last seen 10/23) Hx of coronary artery vasospasm, Diagnosed with left heart cath in 2019, started on amlodipine 2D echo EF 60 to 65%. Cardiology consulted Off amiodarone IV -> on cardizem po 30mg  Q6 Recommend heart monitoring as outpatient  HA Cerebral edema Likely related to current ICH and skull fracture No history of migraine On 3% saline @ 17 be discontinued today Na 136->140->142->145->150->152->145 3% saline to be discontinued today Na monitoring, goal 150-155  Hypertension Home meds:  none Stable SBP goal <160  Long-term BP goal normotensive  Hyperlipidemia Home meds:  none LDL 106, goal <  70 Consider to add statin at discharge  Diabetes type II, controlled Home meds:  [place] HgbA1c 5.7, goal < 7.0 CBGs SSI Outpatient PCP follow-up  Hypokalemia K2.8-replaced check in the morning  Other Stroke Risk Factors Former Cigarette smoker Obesity, Body mass index is 34.16 kg/m., BMI >/= 30 associated with increased stroke risk, recommend weight loss, diet and exercise as appropriate   Other Active Problems Anemia-Hgb 10 Hx of Chronic Pain Gabapentin, Skelaxin, Xanaflex home meds  Hospital day # 5  Pt seen by Neuro NP/APP and later by MD. Note/plan to be edited by MD as needed.    Gevena Mart DNP, ACNPC-AG  [facility]   This patient is critically ill due to ICH and IVH and  at significant risk of neurological worsening, death form brain edema, hemorrhage. This patient's care requires constant monitoring of vital signs, hemodynamics, respiratory and cardiac monitoring, review of multiple databases, neurological assessment, discussion with family, other specialists and medical decision making of high complexity. I spent 30 minutes of neurocritical care time in the care of this patient. She is resting on the bed, complaining of headaches. She is awake, alert, answering appropriately. Plan to DC hypertonic saline. Rest of plan as above. I have spent a total of 30 minutes dedicated to this patient today, preparing to see patient, performing a medically appropriate examination and evaluation.   Windell Norfolk, MD  Neurology      To contact Stroke Continuity provider, please refer to [email or web address]. After hours, contact General Neurology

## 2023-05-27 NOTE — Progress Notes (Signed)
NAME:  Lindsay [name], MRN:  [id number], DOB:  Nov 21, 1961, LOS: 5 ADMISSION DATE:  05/22/2023, CONSULTATION DATE:  6/11 REFERRING MD:  Dr. Elpidio Anis, CHIEF COMPLAINT:  Head bleed   History of Present Illness:  Patient is a 62 yo Female w/ pertinent PMH coronary artery vasospasm, DMT2, HTN presents to [facility] ED on 6/11 w/ head bleed.  On 6/11, patient was at work and had syncopal episode falling and hit back of head. Not on any blood thinners at home. EMS called. Found patient HR 160s and was given 6 mg adenosine then given 20 mg cardizem converting back to sinus rhythm.   Upon arrival to [facility] ED, patient hemodynamically stable and afebrile. EKG sinus rhythm. CT head showing hemorrhage in the anterior right frontal lobe, likely contusion, with surrounding edema; Additional SDH and SAH along the right frontal convexity, with 4 mm right-to-left midline shift. NSG consulted recommending SBP goal <150 and Keppra 500 bid. Recommend ICU admission for close neuro monitoring. PCCM consulted.  Pertinent ED labs: ethanol and UDS wnl, trop 9, K 3.1, mag 1.5, glucose 177 CTA chest with no PE; large hiatal hernia; b/l adrenal nodules possible adenomas.  Pertinent  Medical History   Past Medical History:  Diagnosis Date   Allergic rhinitis, cause unspecified    seasonal   Anginal pain (HCC) 2013   Stress   Arthritis    hips, thumb,knees   Diabetes mellitus without complication (HCC)    Essential hypertension, benign 12/12/2011   Joint pain    Multiple drug allergies    Obesity    Wears glasses    Wears partial dentures    upper     Significant Hospital Events: Including procedures, antibiotic start and stop dates in addition to other pertinent events   6/11 syncopal episode then hit head; ct head with head bleed and midline shift 6/15 episodes of SVT, lethargy  Interim History / Subjective:   Still having some headache No overnight events More awake and alert this morning Remains on 3%  saline  Objective   Blood pressure 136/62, pulse (!) 49, temperature 98 F (36.7 C), temperature source Axillary, resp. rate 17, height 5' 7.5" (1.715 m), weight 100.4 kg, last menstrual period 03/26/2012, SpO2 98 %.        Intake/Output Summary (Last 24 hours) at 05/27/2023 0849 Last data filed at 05/27/2023 0500 Gross per 24 hour  Intake 1520.41 ml  Output 300 ml  Net 1220.41 ml   Filed Weights   05/24/23 0500 05/25/23 0500 05/26/23 0500  Weight: 90.6 kg 92.4 kg 100.4 kg    Examination: General: Elderly lady, does appear comfortable  HEENT: Moist oral mucosa Neuro: Moving all extremities, following commands  CV: S1-S2 appreciated with no murmur PULM: Clear breath sounds bilaterally GI: soft, NT Extremities: no cyanosis or edema Skin: Skin is warm and dry  ABG-7.45/28.5/68 Sodium 144, potassium 3.0, chloride 116, CO2 20, magnesium 2.0  Echo : LVEF 60-65%, no RWMA. RV normal function. Moderately elevated PASP. Dilated IVC with reduced variability.   CT head:1. Stable since 05/24/2023: Ongoing extensive right anterior frontal lobe hemorrhagic contusion with edema and mass effect. Smaller hemorrhagic contusions in the left inferior frontal gyrus and right anterior temporal pole. And small volume anterior right frontal lobe extra-axial blood. Stable intracranial mass effect with effaced right lateral ventricle and 5 mm of leftward midline shift. 2. There is an associated non-depressed left skull base fracture tracking from the occipital condyle toward the occipital protuberance.  3.  No new intracranial abnormality.  Resolved Hospital Problem list   Lactic acidosis  Assessment & Plan:   Syncopal episode -Dr. Chestine Spore did previously discuss syncope precautions with her and her son & daughter- no driving x 6 months and avoid high risk activities without supervision-- ladders, swimming, etc.  -Appreciate cardiology input, plans for post stabilization monitoring  SVT -On Cardizem,  was on amiodarone discontinued 6/14  Right frontal intracerebral hemorrhage with subdural hemorrhage and subarachnoid hemorrhage Posterior scalp laceration -Appreciate neurology, neurosurgery -Continue neuromonitoring -Continue 3% saline -Goal systolic blood pressure less than 160 -On Keppra -Neuroprotective measures- normothermia, euglycemia, HOB greater than 30, head in neutral alignment, normocapnia, normoxia  Hypertension Coronary vasospasm -On Cardizem  Hypokalemia -Replace potassium  History of peripheral neuropathy -On gabapentin  History of GERD -On PPI  Adrenal nodules, outpatient follow-up  Best Practice (right click and "Reselect all SmartList Selections" daily)   Diet/type: dysphagia diet (see orders) DVT prophylaxis: SCD GI prophylaxis: PPI Lines: N/A Foley:  N/A Code Status:  full code Last date of multidisciplinary goals of care discussion [6/12 updated daughter and son at bedside]  Labs   CBC: Recent Labs  Lab 05/22/23 1102 05/22/23 1114 05/23/23 0215 05/26/23 0400 05/26/23 1843 05/27/23 0113 05/27/23 0521  WBC 6.1  --  9.3 5.2  --   --  4.7  HGB 12.8   < > 12.9 10.1* 10.2* 10.5* 10.0*  HCT 40.5   < > 40.6 31.1* 30.0* 31.0* 31.4*  MCV 77.4*  --  76.9* 77.0*  --   --  76.8*  PLT 226  --  198 177  --   --  180   < > = values in this interval not displayed.    Basic Metabolic Panel: Recent Labs  Lab 05/25/23 0322 05/25/23 0942 05/25/23 1619 05/26/23 0400 05/26/23 1030 05/26/23 1843 05/26/23 1857 05/27/23 0112 05/27/23 0113 05/27/23 0521  NA 150* 150*   < > 152*   < > 146* 159* 145 150* 144  K 3.0* 3.5  --   --   --  2.9* 2.5* 2.8* 3.1* 3.0*  CL 122* 123*  --   --   --   --  127* 117*  --  116*  CO2 18* 18*  --   --   --   --  18* 21*  --  20*  GLUCOSE 135* 164*  --   --   --   --  91 95  --  96  BUN 6* 8  --   --   --   --  <5* <5*  --  5*  CREATININE 0.61 0.69  --   --   --   --  0.48 0.61  --  0.65  CALCIUM 8.1* 8.2*  --   --    --   --  7.5* 7.9*  --  7.7*  MG 1.9  --   --  2.0  --   --  1.7 2.4  --  2.0   < > = values in this interval not displayed.   The patient is critically ill with multiple organ systems failure and requires high complexity decision making for assessment and support, frequent evaluation and titration of therapies, application of advanced monitoring technologies and extensive interpretation of multiple databases. Critical Care Time devoted to patient care services described in this note independent of APP/resident time (if applicable)  is 32 minutes.   Virl Diamond MD [facility] Personal pager: See Loretha Stapler  If unanswered, please page CCM On-call: [phone]

## 2023-05-28 ENCOUNTER — Inpatient Hospital Stay (HOSPITAL_COMMUNITY): Payer: 59

## 2023-05-28 DIAGNOSIS — I629 Nontraumatic intracranial hemorrhage, unspecified: Secondary | ICD-10-CM | POA: Diagnosis not present

## 2023-05-28 DIAGNOSIS — R55 Syncope and collapse: Secondary | ICD-10-CM | POA: Diagnosis not present

## 2023-05-28 DIAGNOSIS — R569 Unspecified convulsions: Secondary | ICD-10-CM | POA: Diagnosis not present

## 2023-05-28 LAB — POCT I-STAT 7, (LYTES, BLD GAS, ICA,H+H)
Acid-base deficit: 1 mmol/L (ref 0.0–2.0)
Bicarbonate: 20.4 mmol/L (ref 20.0–28.0)
Calcium, Ion: 1.16 mmol/L (ref 1.15–1.40)
HCT: 32 % — ABNORMAL LOW (ref 36.0–46.0)
Hemoglobin: 10.9 g/dL — ABNORMAL LOW (ref 12.0–15.0)
O2 Saturation: 100 %
Patient temperature: 98.6
Potassium: 3.8 mmol/L (ref 3.5–5.1)
Sodium: 155 mmol/L — ABNORMAL HIGH (ref 135–145)
TCO2: 21 mmol/L — ABNORMAL LOW (ref 22–32)
pCO2 arterial: 25 mmHg — ABNORMAL LOW (ref 32–48)
pH, Arterial: 7.519 — ABNORMAL HIGH (ref 7.35–7.45)
pO2, Arterial: 377 mmHg — ABNORMAL HIGH (ref 83–108)

## 2023-05-28 LAB — CBC
HCT: 38.1 % (ref 36.0–46.0)
Hemoglobin: 11.8 g/dL — ABNORMAL LOW (ref 12.0–15.0)
MCH: 23.4 pg — ABNORMAL LOW (ref 26.0–34.0)
MCHC: 31 g/dL (ref 30.0–36.0)
MCV: 75.6 fL — ABNORMAL LOW (ref 80.0–100.0)
Platelets: 219 10*3/uL (ref 150–400)
RBC: 5.04 MIL/uL (ref 3.87–5.11)
RDW: 21.2 % — ABNORMAL HIGH (ref 11.5–15.5)
WBC: 6.1 10*3/uL (ref 4.0–10.5)
nRBC: 0 % (ref 0.0–0.2)

## 2023-05-28 LAB — BASIC METABOLIC PANEL
Anion gap: 15 (ref 5–15)
Anion gap: 14 (ref 5–15)
BUN: <5 mg/dL — ABNORMAL LOW (ref 8–23)
BUN: <5 mg/dL — ABNORMAL LOW (ref 8–23)
CO2: 21 mmol/L — ABNORMAL LOW (ref 22–32)
CO2: 21 mmol/L — ABNORMAL LOW (ref 22–32)
Calcium: 8.4 mg/dL — ABNORMAL LOW (ref 8.9–10.3)
Calcium: 8.2 mg/dL — ABNORMAL LOW (ref 8.9–10.3)
Chloride: 109 mmol/L (ref 98–111)
Chloride: 117 mmol/L — ABNORMAL HIGH (ref 98–111)
Creatinine, Ser: 0.54 mg/dL (ref 0.44–1.00)
Creatinine, Ser: 0.59 mg/dL (ref 0.44–1.00)
GFR, Estimated: >60 mL/min (ref >60)
GFR, Estimated: >60 mL/min (ref >60)
Glucose, Bld: 138 mg/dL — ABNORMAL HIGH (ref 70–99)
Glucose, Bld: 103 mg/dL — ABNORMAL HIGH (ref 70–99)
Potassium: 2.9 mmol/L — ABNORMAL LOW (ref 3.5–5.1)
Potassium: 3.7 mmol/L (ref 3.5–5.1)
Sodium: 145 mmol/L (ref 135–145)
Sodium: 152 mmol/L — ABNORMAL HIGH (ref 135–145)

## 2023-05-28 LAB — POCT I-STAT EG7
Acid-base deficit: 2 mmol/L (ref 0.0–2.0)
Bicarbonate: 20.3 mmol/L (ref 20.0–28.0)
Calcium, Ion: 1.14 mmol/L — ABNORMAL LOW (ref 1.15–1.40)
HCT: 31 % — ABNORMAL LOW (ref 36.0–46.0)
Hemoglobin: 10.5 g/dL — ABNORMAL LOW (ref 12.0–15.0)
O2 Saturation: 88 %
Patient temperature: 98.3
Potassium: 3.6 mmol/L (ref 3.5–5.1)
Sodium: 155 mmol/L — ABNORMAL HIGH (ref 135–145)
TCO2: 21 mmol/L — ABNORMAL LOW (ref 22–32)
pCO2, Ven: 27.4 mmHg — ABNORMAL LOW (ref 44–60)
pH, Ven: 7.478 — ABNORMAL HIGH (ref 7.25–7.43)
pO2, Ven: 48 mmHg — ABNORMAL HIGH (ref 32–45)

## 2023-05-28 LAB — PHOSPHORUS: Phosphorus: 3.1 mg/dL (ref 2.5–4.6)

## 2023-05-28 LAB — GLUCOSE, CAPILLARY
Glucose-Capillary: 109 mg/dL — ABNORMAL HIGH (ref 70–99)
Glucose-Capillary: 87 mg/dL (ref 70–99)
Glucose-Capillary: 87 mg/dL (ref 70–99)
Glucose-Capillary: 107 mg/dL — ABNORMAL HIGH (ref 70–99)

## 2023-05-28 LAB — MAGNESIUM: Magnesium: 1.7 mg/dL (ref 1.7–2.4)

## 2023-05-28 LAB — SODIUM
Sodium: 143 mmol/L (ref 135–145)
Sodium: 152 mmol/L — ABNORMAL HIGH (ref 135–145)
Sodium: 154 mmol/L — ABNORMAL HIGH (ref 135–145)
Sodium: 151 mmol/L — ABNORMAL HIGH (ref 135–145)

## 2023-05-28 MED ORDER — FENTANYL CITRATE PF 50 MCG/ML IJ SOSY
50.0000 ug | PREFILLED_SYRINGE | INTRAMUSCULAR | Status: AC | PRN
  Administered 2023-05-28 – 2023-05-29 (×3): 50 ug via INTRAVENOUS
  Filled 2023-05-28 (×4): qty 1

## 2023-05-28 MED ORDER — POTASSIUM CHLORIDE 10 MEQ/50ML IV SOLN
10.0000 meq | INTRAVENOUS | Status: AC
  Administered 2023-05-28 (×9): 10 meq via INTRAVENOUS
  Filled 2023-05-28 (×8): qty 50

## 2023-05-28 MED ORDER — FENTANYL CITRATE PF 50 MCG/ML IJ SOSY
PREFILLED_SYRINGE | INTRAMUSCULAR | Status: AC
  Administered 2023-05-28: 100 ug
  Filled 2023-05-28: qty 2

## 2023-05-28 MED ORDER — SODIUM CHLORIDE 3 % IV BOLUS
250.0000 mL | Freq: Once | INTRAVENOUS | Status: AC
  Administered 2023-05-28: 250 mL via INTRAVENOUS
  Filled 2023-05-28: qty 500

## 2023-05-28 MED ORDER — POLYETHYLENE GLYCOL 3350 17 G PO PACK
17.0000 g | PACK | Freq: Every day | ORAL | Status: DC
  Administered 2023-05-29 – 2023-06-07 (×7): 17 g
  Filled 2023-05-28 (×7): qty 1

## 2023-05-28 MED ORDER — MAGNESIUM SULFATE 2 GM/50ML IV SOLN
2.0000 g | Freq: Once | INTRAVENOUS | Status: AC
  Administered 2023-05-28: 2 g via INTRAVENOUS
  Filled 2023-05-28: qty 50

## 2023-05-28 MED ORDER — ACETAMINOPHEN 10 MG/ML IV SOLN
1000.0000 mg | Freq: Four times a day (QID) | INTRAVENOUS | Status: AC
  Administered 2023-05-28 – 2023-05-29 (×4): 1000 mg via INTRAVENOUS
  Filled 2023-05-28 (×4): qty 100

## 2023-05-28 MED ORDER — ETOMIDATE 2 MG/ML IV SOLN
INTRAVENOUS | Status: AC
  Administered 2023-05-28: 20 mg
  Filled 2023-05-28: qty 20

## 2023-05-28 MED ORDER — MIDAZOLAM HCL 2 MG/2ML IJ SOLN
1.0000 mg | INTRAMUSCULAR | Status: DC | PRN
  Administered 2023-06-01 – 2023-06-05 (×3): 2 mg via INTRAVENOUS
  Filled 2023-05-28 (×5): qty 2

## 2023-05-28 MED ORDER — FENTANYL CITRATE PF 50 MCG/ML IJ SOSY
50.0000 ug | PREFILLED_SYRINGE | INTRAMUSCULAR | Status: DC | PRN
  Administered 2023-05-28 – 2023-05-31 (×9): 50 ug via INTRAVENOUS
  Administered 2023-05-31 (×3): 100 ug via INTRAVENOUS
  Administered 2023-06-01: 200 ug via INTRAVENOUS
  Administered 2023-06-01 – 2023-06-04 (×6): 50 ug via INTRAVENOUS
  Administered 2023-06-05: 100 ug via INTRAVENOUS
  Administered 2023-06-05 (×2): 50 ug via INTRAVENOUS
  Administered 2023-06-05: 150 ug via INTRAVENOUS
  Filled 2023-05-28 (×2): qty 1
  Filled 2023-05-28: qty 2
  Filled 2023-05-28: qty 1
  Filled 2023-05-28: qty 2
  Filled 2023-05-28 (×2): qty 1
  Filled 2023-05-28: qty 3
  Filled 2023-05-28 (×2): qty 1
  Filled 2023-05-28: qty 4
  Filled 2023-05-28 (×8): qty 1
  Filled 2023-05-28: qty 2
  Filled 2023-05-28: qty 1
  Filled 2023-05-28: qty 2
  Filled 2023-05-28 (×2): qty 1

## 2023-05-28 MED ORDER — MIDAZOLAM HCL 2 MG/2ML IJ SOLN
INTRAMUSCULAR | Status: AC
  Administered 2023-05-28: 4 mg
  Filled 2023-05-28: qty 2

## 2023-05-28 MED ORDER — DOCUSATE SODIUM 50 MG/5ML PO LIQD
100.0000 mg | Freq: Two times a day (BID) | ORAL | Status: DC
  Administered 2023-05-28 – 2023-06-07 (×13): 100 mg
  Filled 2023-05-28 (×15): qty 10

## 2023-05-28 MED ORDER — GABAPENTIN 300 MG PO CAPS: 300.0000 mg | ORAL_CAPSULE | Freq: Two times a day (BID) | ORAL | Status: DC

## 2023-05-28 MED ORDER — ROCURONIUM BROMIDE 10 MG/ML (PF) SYRINGE
PREFILLED_SYRINGE | INTRAVENOUS | Status: AC
  Administered 2023-05-28: 100 mg
  Filled 2023-05-28: qty 10

## 2023-05-28 MED ORDER — PANTOPRAZOLE SODIUM 40 MG IV SOLR
40.0000 mg | Freq: Every day | INTRAVENOUS | Status: DC
  Administered 2023-05-28 – 2023-06-06 (×10): 40 mg via INTRAVENOUS
  Filled 2023-05-28 (×10): qty 10

## 2023-05-28 MED ORDER — LEVETIRACETAM IN NACL 500 MG/100ML IV SOLN
500.0000 mg | Freq: Two times a day (BID) | INTRAVENOUS | Status: DC
  Administered 2023-05-28 (×2): 500 mg via INTRAVENOUS
  Filled 2023-05-28 (×2): qty 100

## 2023-05-28 MED ORDER — BISACODYL 10 MG RE SUPP
10.0000 mg | Freq: Once | RECTAL | Status: AC
  Administered 2023-05-28: 10 mg via RECTAL
  Filled 2023-05-28: qty 1

## 2023-05-28 MED ORDER — SODIUM CHLORIDE 3 % IV SOLN
INTRAVENOUS | Status: DC
  Filled 2023-05-28: qty 500

## 2023-05-28 NOTE — Procedures (Signed)
Patient Name: Lindsay [name]  MRN: [id number]  Epilepsy Attending: Charlsie Quest  Referring Physician/Provider: Marjorie Smolder, NP  Date: 05/28/2023  Duration: 25.37 mins  Patient history: 62 y.o. female presenting after syncopal episode of unclear etiology. Pt has non-focal neurologic exam with somewhat depressed level of consciousness and CT demonstrates traumatic pattern of right frontal hemorrhage although there is some SAH primarily in the right Sylvian fissure. EEG to evaluate for seizure.   Level of alertness:  lethargic   AEDs during EEG study: LEV  Technical aspects: This EEG study was done with scalp electrodes positioned according to the 10-20 International system of electrode placement. Electrical activity was reviewed with band [place] filter of 1-70Hz , sensitivity of 7 uV/mm, display speed of 21mm/sec with a 60Hz  notched filter applied as appropriate. EEG data were recorded continuously and digitally stored.  Video monitoring was available and reviewed as appropriate.  Description: EEG showed continuous generalized predominantly 5 to 7 Hz theta slowing admixed with intermittent 2-3Hz  delta slowing. Hyperventilation and photic stimulation were not performed.     ABNORMALITY - Continuous slow, generalized  IMPRESSION: This study is suggestive of moderate diffuse encephalopathy, nonspecific etiology. No seizures or epileptiform discharges were seen throughout the recording.  Sameera Betton Annabelle Harman

## 2023-05-28 NOTE — Progress Notes (Addendum)
NAME:  Lindsay [name], MRN:  [id number], DOB:  02-18-1961, LOS: 6 ADMISSION DATE:  05/22/2023, CONSULTATION DATE:  6/11 REFERRING MD:  Dr. Elpidio Anis, CHIEF COMPLAINT:  Head bleed   History of Present Illness:  Patient is a 62 yo Female w/ pertinent PMH coronary artery vasospasm, DMT2, HTN presents to [facility] ED on 6/11 w/ head bleed.  On 6/11, patient was at work and had syncopal episode falling and hit back of head. Not on any blood thinners at home. EMS called. Found patient HR 160s and was given 6 mg adenosine then given 20 mg cardizem converting back to sinus rhythm.   Upon arrival to [facility] ED, patient hemodynamically stable and afebrile. EKG sinus rhythm. CT head showing hemorrhage in the anterior right frontal lobe, likely contusion, with surrounding edema; Additional SDH and SAH along the right frontal convexity, with 4 mm right-to-left midline shift. NSG consulted recommending SBP goal <150 and Keppra 500 bid. Recommend ICU admission for close neuro monitoring. PCCM consulted.  Pertinent ED labs: ethanol and UDS wnl, trop 9, K 3.1, mag 1.5, glucose 177 CTA chest with no PE; large hiatal hernia; b/l adrenal nodules possible adenomas.  Pertinent  Medical History   Past Medical History:  Diagnosis Date   Allergic rhinitis, cause unspecified    seasonal   Anginal pain (HCC) 2013   Stress   Arthritis    hips, thumb,knees   Diabetes mellitus without complication (HCC)    Essential hypertension, benign 12/12/2011   Joint pain    Multiple drug allergies    Obesity    Wears glasses    Wears partial dentures    upper     Significant Hospital Events: Including procedures, antibiotic start and stop dates in addition to other pertinent events   6/11 syncopal episode then hit head; ct head with head bleed and midline shift 6/15 episodes of SVT, lethargy 6/17 Worsening mental status and right blown pupil this am  Interim History / Subjective:   3% discontinued yesterday Minimal  responsive this am, previously nodding appropriately per overnight RN STAT CT head ordered Increased UOP to 3.5L in the last 24 hours  Objective   Blood pressure (!) 150/67, pulse (!) 57, temperature 99.5 F (37.5 C), temperature source Axillary, resp. rate (!) 24, height 5' 7.5" (1.715 m), weight 95.3 kg, last menstrual period 03/26/2012, SpO2 99 %.        Intake/Output Summary (Last 24 hours) at 05/28/2023 0728 Last data filed at 05/28/2023 0635 Gross per 24 hour  Intake 90 ml  Output 3500 ml  Net -3410 ml   Filed Weights   05/25/23 0500 05/26/23 0500 05/28/23 0500  Weight: 92.4 kg 100.4 kg 95.3 kg   Physical Exam: General: Chronically il-appearing, no acute distress HENT: [place], AT, OP clear, MMM Eyes: EOMI, no scleral icterus Respiratory: Clear to auscultation bilaterally.  No crackles, wheezing or rales Cardiovascular: RRR, -M/R/G, no JVD GI: BS+, soft, nontender Extremities:-Edema,-tenderness Neuro: Non-reactive R 4mm pupil, left 2mm pupil, left sided weakness, no withdrawal to noxious stimuli, intermittently grimaces  Na 150>159>144>145 K 2.9 CT head pending  Resolved Hospital Problem list   Lactic acidosis  Assessment & Plan:   Large right frontal lobe and mild right anterior temporal lobe hemorrhagic contusions with edema with mass effect/brain compression Left inferior frontal hemorrhagic contusion with edema Small subdural hemorrhage Posterior scalp lac 6/15 Stable 6/16 Hypertonic weaned and discontinued at 0500 6/17 Less responsive. Protecting airway --STAT CT head --Neurochecks --Neuro updated. Hypertonic saline  bolus and gtt restarted. Trend Na --NSG following --Keppra ---Neuroprotective measures- normothermia, euglycemia, HOB greater than 30, head in neutral alignment, normocapnia, normoxia  Syncopal episode --Dr. Chestine Spore did previously discuss syncope precautions with her and her son & daughter- no driving x 6 months and avoid high risk activities  without supervision-- ladders, swimming, etc.  --Appreciate cardiology input, plans for post stabilization monitoring  SVT --On Cardizem, was on amiodarone discontinued 6/14 --Tele  Hypertension Coronary vasospasm --On Cardizem  Hypokalemia --Repleted  History of peripheral neuropathy --Hold gabapentin in setting AMS  History of GERD --On PPI  Adrenal nodules, outpatient follow-up  Best Practice (right click and "Reselect all SmartList Selections" daily)   Diet/type: dysphagia diet (see orders) DVT prophylaxis: SCD GI prophylaxis: PPI Lines: N/A Foley:  N/A Code Status:  full code Last date of multidisciplinary goals of care discussion [6/12 updated daughter and son at bedside] Attempted to update son via phone 6/17. No answer. LMTCB Addendum: Son called back and updated. Aware of acute neuro changes and plan above     The patient is critically ill with multiple organ systems failure and requires high complexity decision making for assessment and support, frequent evaluation and titration of therapies, application of advanced monitoring technologies and extensive interpretation of multiple databases.  Independent Critical Care Time: 45 Minutes.   Mechele Collin, M.D. [facility] Pulmonary/Critical Care Medicine 05/28/2023 7:29 AM   Please see Amion for pager number to reach on-call Pulmonary and Critical Care Team.

## 2023-05-28 NOTE — Progress Notes (Signed)
Inpatient Rehab Admissions Coordinator:    Pt. Currently unresponsive, not appropriate for CIR at this time. AC will follow up if/when alertness improves.  Megan Salon, MS, CCC-SLP Rehab Admissions Coordinator  [phone] (celll) [phone] (office)

## 2023-05-28 NOTE — Progress Notes (Signed)
Transported patient to MRI while patient is being mechanically ventilated. Patient remained stable during transport.

## 2023-05-28 NOTE — Progress Notes (Signed)
EEG complete - results pending 

## 2023-05-28 NOTE — Progress Notes (Signed)
Pt exam as it had been at 0600. During bedside report, nightshift and dayshift RN discovered pt to be significantly more lethargic, not opening eyes or following commands. MD notified and stat head CT ordered.

## 2023-05-28 NOTE — Progress Notes (Signed)
OT Cancellation Note  Patient Details Name: Lindsay [name] MRN: [id number] DOB: 05/09/1961   Cancelled Treatment:    Reason Eval/Treat Not Completed: Medical issues which prohibited therapy- pt with neuro change. OT to hold at this time.   Barry Brunner, OT Acute Rehabilitation Services Office [phone]   Chancy Milroy 05/28/2023, 10:21 AM

## 2023-05-28 NOTE — Progress Notes (Addendum)
Pts ET tube pulled back from 28cm measured at the lips to 25cm measured at the lips. Per MD. Pt tolerated well, RN aware,RT will monitor as needed.     05/28/23 1756  Airway 7.5 mm  Placement Date/Time: 05/28/23 1340   Grade View: Grade 1  Placed By: ICU physician  Laryngoscope Blade: MAC;3  ETT Types: Oral  Size (mm): 7.5 mm  Cuffed: Cuffed  Insertion attempts: 1  Airway Equipment: Stylet;Video Laryngoscope  Placement Confirmati...  Secured at (cm) (S)  25 cm  Measured From Lips  Secured Location Center  Secured By Commercial Tube Holder  Cuff Pressure (cm H2O) Clear OR 27-39 CmH2O  Site Condition Dry  Adult Ventilator Measurements  SpO2 100 %

## 2023-05-28 NOTE — Progress Notes (Signed)
LTM EEG hooked up and running - no initial skin breakdown - push button tested - [facility] monitoring.  

## 2023-05-28 NOTE — Progress Notes (Addendum)
STROKE TEAM PROGRESS NOTE   INTERVAL HISTORY Her family is not at the bedside.   Patient with mental status change early this AM.See 6/17 neuro note for further details.   Patient remains unresponsive with sternal rub she localizes with the right upper extremity and moves right leg lower extremity to a lesser degree.  Does not much movement on the left with trace left lower extremity withdrawal.  Right pupil is 5 mm and not reactive left is 2 mm sluggishly reactive.  No decerebrate posturing..  Stat repeat CT head done this morning shows no significant interval change with unchanged hemorrhagic contusions bilateral frontal lobes right greater than left. Vitals:   05/28/23 0900 05/28/23 1000 05/28/23 1100 05/28/23 1200  BP: (!) 158/73 (!) 140/73 (!) 163/75   Pulse: 61 62 62   Resp: (!) 24 18 19    Temp:    99.7 F (37.6 C)  TempSrc:    Axillary  SpO2: 100% 99% 100%   Weight:      Height:       CBC:  Recent Labs  Lab 05/27/23 0521 05/28/23 0436  WBC 4.7 6.1  HGB 10.0* 11.8*  HCT 31.4* 38.1  MCV 76.8* 75.6*  PLT 180 219   Basic Metabolic Panel:  Recent Labs  Lab 05/27/23 0521 05/28/23 0436 05/28/23 0805 05/28/23 1037  NA 144 145 143 152*  152*  K 3.0* 2.9*  --  3.7  CL 116* 109  --  117*  CO2 20* 21*  --  21*  GLUCOSE 96 138*  --  103*  BUN 5* <5*  --  <5*  CREATININE 0.65 0.54  --  0.59  CALCIUM 7.7* 8.4*  --  8.2*  MG 2.0 1.7  --   --   PHOS  --  3.1  --   --    Lipid Panel:  Recent Labs  Lab 05/23/23 0215  CHOL 197  TRIG 50  HDL 81  CHOLHDL 2.4  VLDL 10  LDLCALC 409*   HgbA1c:  Recent Labs  Lab 05/22/23 1805  HGBA1C 5.7*   Urine Drug Screen:  Recent Labs  Lab 05/22/23 1103  LABOPIA NONE DETECTED  COCAINSCRNUR NONE DETECTED  LABBENZ NONE DETECTED  AMPHETMU NONE DETECTED  THCU NONE DETECTED  LABBARB NONE DETECTED    Alcohol Level  Recent Labs  Lab 05/22/23 1101  ETH <10    IMAGING past 24 hours EEG adult  Result Date:  05/28/2023 Charlsie Quest, MD     05/28/2023  9:35 AM Patient Name: Lindsay [name] MRN: [id number] Epilepsy Attending: Charlsie Quest Referring Physician/Provider: Marjorie Smolder, NP Date: 05/28/2023 Duration: 25.37 mins Patient history: 62 y.o. female presenting after syncopal episode of unclear etiology. Pt has non-focal neurologic exam with somewhat depressed level of consciousness and CT demonstrates traumatic pattern of right frontal hemorrhage although there is some SAH primarily in the right Sylvian fissure. EEG to evaluate for seizure. Level of alertness:  lethargic AEDs during EEG study: LEV Technical aspects: This EEG study was done with scalp electrodes positioned according to the 10-20 International system of electrode placement. Electrical activity was reviewed with band Kurka filter of 1-70Hz , sensitivity of 7 uV/mm, display speed of 29mm/sec with a 60Hz  notched filter applied as appropriate. EEG data were recorded continuously and digitally stored.  Video monitoring was available and reviewed as appropriate. Description: EEG showed continuous generalized predominantly 5 to 7 Hz theta slowing admixed with intermittent 2-3Hz  delta slowing. Hyperventilation and  photic stimulation were not performed.   ABNORMALITY - Continuous slow, generalized IMPRESSION: This study is suggestive of moderate diffuse encephalopathy, nonspecific etiology. No seizures or epileptiform discharges were seen throughout the recording. [name]   CT HEAD WO CONTRAST ( )  Result Date: 05/28/2023 CLINICAL DATA:  Mental status change, unknown cause EXAM: CT HEAD WITHOUT CONTRAST TECHNIQUE: Contiguous axial images were obtained from the base of the skull through the vertex without intravenous contrast. RADIATION DOSE REDUCTION: This exam was performed according to the departmental dose-optimization program which includes automated exposure control, adjustment of the mA and/or kV according to patient size and/or  use of iterative reconstruction technique. COMPARISON:  CT Head 05/26/23 FINDINGS: Brain: Redemonstrated right frontal lobe hemorrhagic contusion not significantly changed from prior exam with persistent approximate 7 mm leftward midline shift. Small volume extra axial blood products along the right frontal lobe and in the right middle cranial fossa is unchanged. Smaller hemorrhagic contusion is also present in the inferior left frontal lobe, which is unchanged from prior exam. There is likely an additional site of nonhemorrhagic contusion in the anterior right temporal lobe (series 3, image 7), which is also unchanged. No new sites of hemorrhage visualized. No CT evidence of an acute infarct. No hydrocephalus. Vascular: No hyperdense vessel or unexpected calcification. Skull: Normal. Negative for fracture or focal lesion. Sinuses/Orbits: No middle ear or mastoid effusion. Pansinus mucosal thickening with complete opacification of bilateral maxillary sinuses and right frontal sinus. There are osseous changes suggestive of bilateral maxillary sinusitis. Other: Redemonstrated nondisplaced left skull base fracture involving the left occipital condyle. IMPRESSION: 1. No significant interval change from prior exam with unchanged hemorrhagic contusions in the bilateral frontal lobes (right greater than left) and non hemorrhagic contusion at the anterior right temporal lobe. 2. Small volume extra axial blood products along the right frontal lobe and in the right middle cranial fossa is unchanged. 3. Redemonstrated nondisplaced left skull base fracture involving the left occipital condyle Electronically Signed   By: Lorenza Cambridge M.D.   On: 05/28/2023 07:50    PHYSICAL EXAM  Constitutional: Well-developed, well-nourished, and in no distress.  HENT:  Eyes: R pupil dilated, not reactive to light.  L pupil sluggish, but reactive to light.  Head: Normocephalic and atraumatic.  Cardiovascular: Normal rate, regular rhythm,  intact distal pulses. Pulmonary: Non labored breathing on room air, no wheezing or rales  Abdominal: Soft. Normal bowel sounds. Non distended and non tender Skin: Skin is warm and dry.  Neurological: Patient is stuporous and unresponsive.  Eyes are closed.  Disconjugate eyes with left eye hypertropia.  Right pupil 5 mm fixed nonreactive.  Left pupil 3 mm sluggishly reactive.  Corneal reflexes are present.  Cough and gag is present.  There is respiratory effort about ventilator settings.  Patient responds to sternal rub with purposeful flexion movement in the right arm and slight movement in the right leg.  No movement in the left upper extremity and trace withdrawal in the left lower extremity.  ASSESSMENT/PLAN Lindsay [name] is a 63 y.o. female with history of T2DM presenting after syncopal episode found to have R frontal lobe hemorrhage with 4mm shift. Patient noted to have acute mental status change 6/17 AM. Repeat CT head with no significant change. 3% saline tapered 6/16 AM. Patient now unresponsive with unequal pupils.   ICH - traumatic with right frontal contusion and acute occipital skull fracture, type: contrecoup injury CT head:.  Right frontal lobe hemorrhage with surrounding edema.  4 mm right to left midline shift.  Additional subdural and subarachnoid hemorrhages along the right frontal convexity. CTA head & neck: No LVO or significant stenosis in head or neck. Repeat CT head: Scattered posttraumatic subarachnoid hemorrhage with superimposed hemorrhagic contusions in the anterior right frontal convexity, slightly increased.  Mild mild mass effect with no more than trace right to left shift, stable. Evolving left posterior scalp contusion with laceration. Underlying acute nondisplaced nondepressed occipital skull fracture Repeat CT 6/12 PM: Increase size of hemorrhagic contusion involving anterior right frontal lobe, now measuring 5 x 5 x 3 0.2 x 3.3 cm. Worsening surrounding vasogenic  edema and regional mass effect, 5 mm right to left shift now seen. Repeat CT head 6/13 a.m: Stable hemorrhagic contusion with edema.  Stable mass effect with leftward midline shift of 5 to 6 mm.  Mild SAH. CT head 6/15 AM. Stable since 6/13.  CT head 6/17 am. No interval change. Unchanged hemorrhagic contusion in bilateral frontal lobes. Non hemorrhagic contusion at anterior R temporal lobe. Stable nondisplaced L skull base Fx hgbA1c 5.7 2D Echo, EF 60 to 65%, unchanged from prior  LDL 106 HgbA1c 5.7 VTE prophylaxis - SCDs  No antithrombotic prior to admission, now on No antithrombotic due to ICH Continue Keppra Continue ICU, given patient's mental status change, NSGY to re-evaluate, CT head this AM without significant change. EEG with moderate diffuse encephalopathy, no seizures or epileptiform discharges  Resume hypertonic saline, Na 152<143.  Na monitoring goal 150-155 Will need to be intubated  Therapy recommendations:  Patient will to be re-evaluated once more alert Disposition:  ICU   ?Syncope SVT Normal rate 2D echo EF 60 to 65%. No AS or AR. Mod elevated PASP Monitor on discharge   Hypertension Home meds:  None Stable SBP goal <160 Long-term BP goal normotensive  Hyperlipidemia Home meds:  None LDL 106, goal < 70 Consider addition of statin at discharge   Diabetes type II Controlled Home meds:  Mounjaro  HgbA1c 5.7, goal < 7.0 CBGs Recent Labs    05/27/23 2155 05/28/23 0803 05/28/23 1143  GLUCAP 122* 109* 87    SSI    Other Active Problems Microcytic anemia: Stable hgb 11.8  Hospital day # 6  Marolyn Haller, MD PGY-3 Internal Medicine Resident  Pager [phone]  STROKE MD NOTE :  I have personally obtained history,examined this patient, reviewed notes, independently viewed imaging studies, participated in medical decision making and plan of care.ROS completed by me personally and pertinent positives fully documented  I have made any additions or  clarifications directly to the above note. Agree with note above.  Patient neurological exam is declined this morning with decreased responsiveness with asymmetric dilated right pupil and poor responsiveness on the left.  Repeat CT does not show significant increase in either continuation or cerebral edema.  Plan obtain stat EEG for any nonconvulsive seizures and if 23.4% saline bolus and was started hypertonic saline at 75 cc an hour with serum sodium goal 150-155.  Patient neurological condition is declining and she may not be able to protect her airway and may need elective intubation.  I spoke over the phone with the patient's son during a.m. rounds and later met him and his wife in person during the afternoon and discussed patient's declining condition, poor prognosis and treatment plan.  Plan to obtain MRI after patient is intubated.  I also spoke to Dr. Conchita Paris neurosurgeon who felt patient was not a candidate for decompressive craniotomy at the present  time.  Patient's son wants full support at the present time and needs time to discuss with his skin and rest of the family to make decisions about prolonged ventilatory support, tracheostomy and PEG tube later.  Discussed with Dr. Mechele Collin critical care medicine.This patient is critically ill and at significant risk of neurological worsening, death and care requires constant monitoring of vital signs, hemodynamics,respiratory and cardiac monitoring, extensive review of multiple databases, frequent neurological assessment, discussion with family, other specialists and medical decision making of high complexity.I have made any additions or clarifications directly to the above note.This critical care time does not reflect procedure time, or teaching time or supervisory time of PA/NP/Med Resident etc but could involve care discussion time.  I spent 30 minutes of neurocritical care time  in the care of  this patient.      Delia Heady, MD Medical  Director [facility] Pager: [phone] 05/28/2023 3:36 PM   To contact Stroke Continuity provider, please refer to [email or web address]. After hours, contact General Neurology

## 2023-05-28 NOTE — Progress Notes (Signed)
PT Cancellation Note  Patient Details Name: AANCHAL URBIN MRN: [id number] DOB: 1961/08/25   Cancelled Treatment:    Reason Eval/Treat Not Completed: Medical issues which prohibited therapy this morning as pt with acute neuro change. RN recommends therapies to hold at this time, will re-evaluate and resume care as medically appropriate.   Vickki Muff, PT, DPT   Acute Rehabilitation Department Office [phone] Secure Chat Communication Preferred   Ronnie Derby 05/28/2023, 10:37 AM

## 2023-05-28 NOTE — Progress Notes (Signed)
[facility] ADULT ICU REPLACEMENT PROTOCOL   The patient does apply for the [facility] Adult ICU Electrolyte Replacment Protocol based on the criteria listed below:   1.Exclusion criteria: TCTS, ECMO, Dialysis, and Myasthenia Gravis patients 2. Is GFR >/= 30 ml/min? Yes.    Patient's GFR today is >60 3. Is SCr </= 2? Yes.   Patient's SCr is 0.54 mg/dL 4. Did SCr increase >/= 0.5 in 24 hours? No. 5.Pt's weight >40kg  Yes.   6. Abnormal electrolyte(s): K, Mag  7. Electrolytes replaced per protocol 8.  Call MD STAT for K+ </= 2.5, Phos </= 1, or Mag </= 1 Physician:  Thersa Salt Saadiya Wilfong 05/28/2023 5:27 AM

## 2023-05-28 NOTE — Progress Notes (Signed)
I spoke with Dr. Pearlean Brownie this am and reviewed the most recent CT scans. Pt apparently was at baseline over weekend, at some point last night became less responsive and was essentially unresponsive this am. Has since required intubation. Her CT scan again demonstrates the right frontal contusion with associated surrounding edema, essentially unchanged over the last few days. I do not see a role for operative evacuation of contused brain especially in the setting of acute clinical change but without radiographic change. Would cont with current supportive care. Could also consider MRI brain if EEG was negative.  Lisbeth Renshaw, MD [facility]

## 2023-05-28 NOTE — Procedures (Signed)
Intubation Procedure Note  Lindsay [name]  [id number]  08-Jan-1961  Date:05/28/23  Time:1:45 PM   Provider Performing:Makhi Muzquiz Mechele Collin, MD   Procedure: Intubation (31500)  Indication(s) Respiratory Failure  Consent Risks of the procedure as well as the alternatives and risks of each were explained to the patient and/or caregiver.  Consent for the procedure was obtained and is signed in the bedside chart   Anesthesia Etomidate, Versed, Fentanyl, and Rocuronium   Time Out Verified patient identification, verified procedure, site/side was marked, verified correct patient position, special equipment/implants available, medications/allergies/relevant history reviewed, required imaging and test results available.   Sterile Technique Usual hand hygeine, masks, and gloves were used   Procedure Description Patient positioned in bed supine.  Sedation given as noted above.  Patient was intubated with endotracheal tube using Glidescope.  View was Grade 1 full glottis .  Number of attempts was 1.  Colorimetric CO2 detector was consistent with tracheal placement.   Complications/Tolerance None; patient tolerated the procedure well. Chest X-ray is ordered to verify placement.   EBL Minimal  Specimen(s) None

## 2023-05-28 NOTE — Progress Notes (Signed)
Patient not available for EEG at the moment, will be going to MRI shortly, Tech will try back after 9pm-as schedule allows.

## 2023-05-28 NOTE — Progress Notes (Signed)
Multiple attempts were made to schedule MRI. At 1745 a time was set for 1815. Respiratory and nursing staff was prepared for MRI. Transport did not arrive until 1845. I informed the transport staff that he was 30 min late and the scan will have to be delayed yet again. MD is aware of the delay. Spoke with MRI and a new time of 2000 has been set. Information and urgency for MRI has been passed on to charge and night shift.

## 2023-05-28 NOTE — Plan of Care (Signed)
Shortly after 07:00, patient was noted to be unresponsive to sternal rub with unequal pupils, right greater than left, both nonreactive.  Patient reportedly at 6:00 was drowsy but would respond to voice and follow commands.  No seizure activity noted.  Patient has also had increased urine output.  On exam, patient will slightly grimace to sternal rub and withdraws bilateral legs to proximal pinch.  Stat head CT was obtained which looks stable from previous CT 2 days ago.  250 cc bolus of 3% hypertonic saline given and hypertonic saline restarted at 75 mL/h.  Stat EEG ordered.  Spoke with Dr. Pearlean Brownie and updated him on patient's condition.

## 2023-05-29 DIAGNOSIS — I629 Nontraumatic intracranial hemorrhage, unspecified: Secondary | ICD-10-CM | POA: Diagnosis not present

## 2023-05-29 DIAGNOSIS — R569 Unspecified convulsions: Secondary | ICD-10-CM | POA: Diagnosis not present

## 2023-05-29 LAB — GLUCOSE, CAPILLARY
Glucose-Capillary: 70 mg/dL (ref 70–99)
Glucose-Capillary: 64 mg/dL — ABNORMAL LOW (ref 70–99)
Glucose-Capillary: 71 mg/dL (ref 70–99)
Glucose-Capillary: 138 mg/dL — ABNORMAL HIGH (ref 70–99)
Glucose-Capillary: 159 mg/dL — ABNORMAL HIGH (ref 70–99)

## 2023-05-29 LAB — CBC WITH DIFFERENTIAL/PLATELET
Abs Immature Granulocytes: 0.05 10*3/uL (ref 0.00–0.07)
Basophils Absolute: 0 10*3/uL (ref 0.0–0.1)
Basophils Relative: 0 %
Eosinophils Absolute: 0.1 10*3/uL (ref 0.0–0.5)
Eosinophils Relative: 2 %
HCT: 32.9 % — ABNORMAL LOW (ref 36.0–46.0)
Hemoglobin: 10.2 g/dL — ABNORMAL LOW (ref 12.0–15.0)
Immature Granulocytes: 1 %
Lymphocytes Relative: 19 %
Lymphs Abs: 1.1 10*3/uL (ref 0.7–4.0)
MCH: 24.1 pg — ABNORMAL LOW (ref 26.0–34.0)
MCHC: 31 g/dL (ref 30.0–36.0)
MCV: 77.8 fL — ABNORMAL LOW (ref 80.0–100.0)
Monocytes Absolute: 0.7 10*3/uL (ref 0.1–1.0)
Monocytes Relative: 12 %
Neutro Abs: 4 10*3/uL (ref 1.7–7.7)
Neutrophils Relative %: 66 %
Platelets: 192 10*3/uL (ref 150–400)
RBC: 4.23 MIL/uL (ref 3.87–5.11)
RDW: 21.1 % — ABNORMAL HIGH (ref 11.5–15.5)
WBC: 6 10*3/uL (ref 4.0–10.5)
nRBC: 0 % (ref 0.0–0.2)

## 2023-05-29 LAB — BASIC METABOLIC PANEL
Anion gap: 7 (ref 5–15)
BUN: 9 mg/dL (ref 8–23)
CO2: 22 mmol/L (ref 22–32)
Calcium: 8 mg/dL — ABNORMAL LOW (ref 8.9–10.3)
Chloride: 122 mmol/L — ABNORMAL HIGH (ref 98–111)
Creatinine, Ser: 0.64 mg/dL (ref 0.44–1.00)
GFR, Estimated: >60 mL/min (ref >60)
Glucose, Bld: 94 mg/dL (ref 70–99)
Potassium: 3.5 mmol/L (ref 3.5–5.1)
Sodium: 151 mmol/L — ABNORMAL HIGH (ref 135–145)

## 2023-05-29 LAB — MAGNESIUM: Magnesium: 2 mg/dL (ref 1.7–2.4)

## 2023-05-29 LAB — SODIUM
Sodium: 120 mmol/L — ABNORMAL LOW (ref 135–145)
Sodium: 151 mmol/L — ABNORMAL HIGH (ref 135–145)
Sodium: 155 mmol/L — ABNORMAL HIGH (ref 135–145)
Sodium: 152 mmol/L — ABNORMAL HIGH (ref 135–145)

## 2023-05-29 MED ORDER — PROSOURCE TF20 ENFIT COMPATIBL EN LIQD
60.0000 mL | Freq: Every day | ENTERAL | Status: DC
  Administered 2023-05-29 – 2023-06-07 (×7): 60 mL
  Filled 2023-05-29 (×7): qty 60

## 2023-05-29 MED ORDER — ACETAMINOPHEN 325 MG PO TABS
650.0000 mg | ORAL_TABLET | ORAL | Status: DC | PRN
  Administered 2023-05-29 – 2023-05-31 (×7): 650 mg
  Filled 2023-05-29 (×7): qty 2

## 2023-05-29 MED ORDER — OSMOLITE 1.5 CAL PO LIQD
1000.0000 mL | ORAL | Status: DC
  Administered 2023-05-29 – 2023-06-05 (×7): 1000 mL
  Filled 2023-05-29: qty 1000

## 2023-05-29 MED ORDER — POTASSIUM CHLORIDE 20 MEQ PO PACK
60.0000 meq | PACK | Freq: Once | ORAL | Status: AC
  Administered 2023-05-29: 60 meq
  Filled 2023-05-29: qty 3

## 2023-05-29 MED ORDER — ASPIRIN 81 MG PO TBEC
81.0000 mg | DELAYED_RELEASE_TABLET | Freq: Every day | ORAL | Status: DC
  Administered 2023-05-29: 81 mg via ORAL
  Filled 2023-05-29: qty 1

## 2023-05-29 MED ORDER — ORAL CARE MOUTH RINSE: 15.0000 mL | OROMUCOSAL | Status: DC | PRN

## 2023-05-29 MED ORDER — PIVOT 1.5 CAL PO LIQD: 1000.0000 mL | ORAL | Status: DC

## 2023-05-29 MED ORDER — HYDRALAZINE HCL 25 MG PO TABS
25.0000 mg | ORAL_TABLET | Freq: Three times a day (TID) | ORAL | Status: DC
  Administered 2023-05-29 – 2023-05-30 (×4): 25 mg
  Filled 2023-05-29 (×4): qty 1

## 2023-05-29 MED ORDER — POTASSIUM CHLORIDE 20 MEQ PO PACK
40.0000 meq | PACK | Freq: Once | ORAL | Status: AC
  Administered 2023-05-29: 40 meq
  Filled 2023-05-29: qty 2

## 2023-05-29 MED ORDER — ORAL CARE MOUTH RINSE
15.0000 mL | OROMUCOSAL | Status: DC
  Administered 2023-05-29 – 2023-06-03 (×65): 15 mL via OROMUCOSAL

## 2023-05-29 MED ORDER — SODIUM CHLORIDE 3 % IV SOLN
INTRAVENOUS | Status: DC
  Filled 2023-05-29 (×3): qty 500

## 2023-05-29 NOTE — Progress Notes (Signed)
[facility] ADULT ICU REPLACEMENT PROTOCOL   The patient does apply for the [facility] Adult ICU Electrolyte Replacment Protocol based on the criteria listed below:   1.Exclusion criteria: TCTS, ECMO, Dialysis, and Myasthenia Gravis patients 2. Is GFR >/= 30 ml/min? Yes.    Patient's GFR today is >60 3. Is SCr </= 2? Yes.   Patient's SCr is 0.64 mg/dL 4. Did SCr increase >/= 0.5 in 24 hours? No. 5.Pt's weight >40kg  Yes.   6. Abnormal electrolyte(s): potassium 3.5  7. Electrolytes replaced per protocol 8.  Call MD STAT for K+ </= 2.5, Phos </= 1, or Mag </= 1 Physician:  protocol  Melvern Banker 05/29/2023 5:16 AM

## 2023-05-29 NOTE — Progress Notes (Addendum)
STROKE TEAM PROGRESS NOTE   INTERVAL HISTORY Her family is at the bedside.    Patient more easily aroused this AM. She moved her R side spontaneously and opened her eyes. Did not follow any commands. Patient MRI brain with new R PCA infarct.  She is on hypertonic saline and serum sodium is optimal at 151 today.  MRI scan did show new large right complete PCA territory infarct with mild cytotoxic edema.  No significant midline shift. Vitals:   05/29/23 1200 05/29/23 1230 05/29/23 1300 05/29/23 1600  BP: 118/64 (!) 142/66 (!) 143/66   Pulse: (!) 54 (!) 59 63   Resp: 13 17 (!) 21   Temp:    99.7 F (37.6 C)  TempSrc:    Oral  SpO2: 100% 100% 100%   Weight:      Height:       CBC:  Recent Labs  Lab 05/28/23 0436 05/28/23 1711 05/28/23 1734 05/29/23 0356  WBC 6.1  --   --  6.0  NEUTROABS  --   --   --  4.0  HGB 11.8*   < > 10.9* 10.2*  HCT 38.1   < > 32.0* 32.9*  MCV 75.6*  --   --  77.8*  PLT 219  --   --  192   < > = values in this interval not displayed.    Basic Metabolic Panel:  Recent Labs  Lab 05/28/23 0436 05/28/23 0805 05/28/23 1037 05/28/23 1239 05/28/23 1734 05/28/23 2102 05/29/23 0356 05/29/23 0817 05/29/23 1433  NA 145   < > 152*  152*   < > 155*   < > 151* 151* 155*  K 2.9*  --  3.7   < > 3.8  --  3.5  --   --   CL 109  --  117*  --   --   --  122*  --   --   CO2 21*  --  21*  --   --   --  22  --   --   GLUCOSE 138*  --  103*  --   --   --  94  --   --   BUN <5*  --  <5*  --   --   --  9  --   --   CREATININE 0.54  --  0.59  --   --   --  0.64  --   --   CALCIUM 8.4*  --  8.2*  --   --   --  8.0*  --   --   MG 1.7  --   --   --   --   --  2.0  --   --   PHOS 3.1  --   --   --   --   --   --   --   --    < > = values in this interval not displayed.    Lipid Panel:  Recent Labs  Lab 05/23/23 0215  CHOL 197  TRIG 50  HDL 81  CHOLHDL 2.4  VLDL 10  LDLCALC 098*    HgbA1c:  Recent Labs  Lab 05/22/23 1805  HGBA1C 5.7*    Urine Drug  Screen:  No results for input(s): "LABOPIA", "COCAINSCRNUR", "LABBENZ", "AMPHETMU", "THCU", "LABBARB" in the last 168 hours.   Alcohol Level  No results for input(s): "ETH" in the last 168 hours.   IMAGING past 24 hours Overnight EEG with  video  Result Date: 05/29/2023 Charlsie Quest, MD     05/29/2023  9:59 AM Patient Name: Lindsay [name] MRN: [id number] Epilepsy Attending: Charlsie Quest Referring Physician/Provider: Erick Blinks, MD Duration: 05/28/2023 2222 to 05/29/2023 1000  Patient history: 62 y.o. female presenting after syncopal episode of unclear etiology. Pt has non-focal neurologic exam with somewhat depressed level of consciousness and CT demonstrates traumatic pattern of right frontal hemorrhage although there is some SAH primarily in the right Sylvian fissure. EEG to evaluate for seizure.  Level of alertness:  lethargic  AEDs during EEG study: None  Technical aspects: This EEG study was done with scalp electrodes positioned according to the 10-20 International system of electrode placement. Electrical activity was reviewed with band Remlinger filter of 1-70Hz , sensitivity of 7 uV/mm, display speed of 38mm/sec with a 60Hz  notched filter applied as appropriate. EEG data were recorded continuously and digitally stored.  Video monitoring was available and reviewed as appropriate.  Description: EEG showed continuous generalized and lateralized right hemisphere predominantly 5 to 7 Hz theta slowing admixed with intermittent 2-3Hz  delta slowing. Hyperventilation and photic stimulation were not performed.    ABNORMALITY - Continuous slow, generalized and lateralized right hemisphere  IMPRESSION: This study is suggestive of cortical dysfunction arising from right hemisphere likely secondary to underlying structural abnormality. Additionally there is moderate diffuse encephalopathy, nonspecific etiology. No seizures or epileptiform discharges were seen throughout the recording.  Charlsie Quest     MR BRAIN WO CONTRAST  Result Date: 05/28/2023 CLINICAL DATA:  Altered mental status EXAM: MRI HEAD WITHOUT CONTRAST TECHNIQUE: Multiplanar, multiecho pulse sequences of the brain and surrounding structures were obtained without intravenous contrast. COMPARISON:  None Available. FINDINGS: Brain: Acute cortical ischemia throughout the right PCA territory with small areas of acute ischemia of the right thalamus and the bilateral midbrain. Bilateral, right greater than left, mixed intra-axial and extra-axial hematomas of the anterior convexities. There is 7 mm of leftward midline shift. No hydrocephalus. Shallow sella turcica. Vascular: Absence of the normal right PCA flow void. Skull and upper cervical spine: Bone marrow signal is normal. Known left occipital condyle fracture better characterized on recent CT. Sinuses/Orbits:Bilateral chronic maxillary sinus disease and opacification of the right frontal and right anterior ethmoid sinuses. Normal orbits. IMPRESSION: 1. Acute cortical ischemia throughout the right PCA territory with small areas of acute ischemia of the right thalamus and the bilateral midbrain. 2. Bilateral, right greater than left, mixed intra- and extra-axial hematomas of the anterior convexities with 7 mm of leftward midline shift. Electronically Signed   By: Deatra Robinson M.D.   On: 05/28/2023 21:08   DG Abd 1 View  Result Date: 05/28/2023 CLINICAL DATA:  Intubation and orogastric tube EXAM: ABDOMEN - 1 VIEW COMPARISON:  None Available. FINDINGS: Enteric tube tip is in the mid stomach. No dilated bowel loops are seen. IMPRESSION: Enteric tube tip is in the mid stomach. Electronically Signed   By: Darliss Cheney M.D.   On: 05/28/2023 21:08   DG CHEST PORT 1 VIEW  Result Date: 05/28/2023 CLINICAL DATA:  Intubated EXAM: PORTABLE CHEST 1 VIEW COMPARISON:  05/28/2023, chest CT 05/22/2023 FINDINGS: Endotracheal tube tip about 2 cm superior to carina. Esophageal tube tip in the left upper  quadrant, this is looped within the epigastric area. No acute airspace disease or effusion. Stable cardiomediastinal silhouette. Right upper extremity central venous catheter tip over the SVC. Aortic atherosclerosis IMPRESSION: Endotracheal tube tip about 2 cm superior to carina. Minimal atelectasis right base.  Electronically Signed   By: Jasmine Pang M.D.   On: 05/28/2023 21:05    PHYSICAL EXAM  Constitutional: Well-developed, well-nourished middle-aged African-American lady who is intubated and sedated, and in no distress.  HENT:  Head: Normocephalic and atraumatic.  Cardiovascular: Normal rate, regular rhythm, intact distal pulses. Pulmonary: Non labored breathing on room air, no wheezing or rales  Abdominal: Soft. Normal bowel sounds. Non distended and non tender Skin: Skin is warm and dry.    Neurological: Patient is intubated and sedated.  Patient more awake. Opens eyes and moves R side with noxius stimuli.  Right pupil 5 mm remains fixed nonreactive.  Left pupil 3 mm sluggishly reactive.  Corneal reflexes are present.  Cough and gag present. No movement in the left upper extremity and trace withdrawal in the left lower extremity.  ASSESSMENT/PLAN Ms. TAMMATHA PRIMAVERA is a 62 y.o. female with history of T2DM presenting after syncopal episode found to have R frontal lobe hemorrhage with 4mm shift. Patient noted to have acute mental status change 6/17 AM. Repeat CT head with no significant change. 3% saline tapered 6/16 AM. Patient found to be unresponsive with unequal pupils and MRI brain demonstrated acute R PCA infarct.   Acute new R PCA infarct on top of recent right greater than left frontal hemorrhagic contusions few days prior ICH - Likely in the setting of recent infarct CT head:.  Right frontal lobe hemorrhage with surrounding edema.  4 mm right to left midline shift.  Additional subdural and subarachnoid hemorrhages along the right frontal convexity. CTA head & neck: No LVO or  significant stenosis in head or neck. Repeat CT head: Scattered posttraumatic subarachnoid hemorrhage with superimposed hemorrhagic contusions in the anterior right frontal convexity, slightly increased.  Mild mild mass effect with no more than trace right to left shift, stable. Evolving left posterior scalp contusion with laceration. Underlying acute nondisplaced nondepressed occipital skull fracture Repeat CT 6/12 PM: Increase size of hemorrhagic contusion involving anterior right frontal lobe, now measuring 5 x 5 x 3 0.2 x 3.3 cm. Worsening surrounding vasogenic edema and regional mass effect, 5 mm right to left shift now seen. Repeat CT head 6/13 a.m: Stable hemorrhagic contusion with edema.  Stable mass effect with leftward midline shift of 5 to 6 mm.  Mild SAH. CT head 6/15 AM. Stable since 6/13.  CT head 6/17 am. No interval change. Unchanged hemorrhagic contusion in bilateral frontal lobes. Non hemorrhagic contusion at anterior R temporal lobe. Stable nondisplaced L skull base Fx MRI brain R PCA infarct 6/17 hgbA1c 5.7 2D Echo, EF 60 to 65%, unchanged from prior  LDL 106 HgbA1c 5.7 VTE prophylaxis - SCDs  No antithrombotic prior to admission, now on No antithrombotic due to ICH Continue Keppra  Continue hypertonic saline, Na 152<143.  Na monitoring goal 150-155 Therapy recommendations:  Pending  Disposition:  ICU   ?Syncope SVT Normal rate 2D echo EF 60 to 65%. No AS or AR. Mod elevated PASP Will need monitor on discharge   Hypertension Home meds:  None Stable SBP goal <160 Long-term BP goal normotensive  Hyperlipidemia Home meds:  None LDL 106, goal < 70 Consider addition of statin at discharge   Diabetes type II Controlled Home meds:  Mounjaro  HgbA1c 5.7, goal < 7.0 CBGs Recent Labs    05/29/23 0801 05/29/23 1144 05/29/23 1519  GLUCAP 70 64* 71     SSI  Other Active Problems Microcytic anemia: Stable hgb 11.8  Hospital day # 7  Marolyn Haller, MD  PGY-3 Internal Medicine Resident  Pager [phone]  STROKE MD NOTE:  I have personally obtained history,examined this patient, reviewed notes, independently viewed imaging studies, participated in medical decision making and plan of care.ROS completed by me personally and pertinent positives fully documented  I have made any additions or clarifications directly to the above note. Agree with note above.  Patient neurological exam is declined 2 days prior likely secondary to new right PCA infarct which is quite large with mild cytotoxic edema.  Patient condition is improving after starting hypertonic saline.  Continue ventilatory support and aggressive medical therapy with hypertonic saline with serum sodium 1 50-1 55.  Etiology of this new PCA infarct is unclear as CT angiogram done at the time of admission had shown no stenosis or narrowing of the right PCA.  Will add aspirin 81 mg daily due to new stroke for secondary prevention.  I had a long discussion at the bedside with patient's son and daughter-in-law regarding her prognosis and plan of care and answered questions.  Family wants full support for now but will need to reconsider decision about trach and PEG after a few days depending upon her condition.This patient is critically ill and at significant risk of neurological worsening, death and care requires constant monitoring of vital signs, hemodynamics,respiratory and cardiac monitoring, extensive review of multiple databases, frequent neurological assessment, discussion with family, other specialists and medical decision making of high complexity.I have made any additions or clarifications directly to the above note.This critical care time does not reflect procedure time, or teaching time or supervisory time of PA/NP/Med Resident etc but could involve care discussion time.  I spent 30 minutes of neurocritical care time  in the care of  this patient.      Delia Heady, MD Medical Director [facility] Pager: [phone] 05/29/2023 4:45 PM  To contact Stroke Continuity provider, please refer to [email or web address]. After hours, contact General Neurology

## 2023-05-29 NOTE — Progress Notes (Signed)
PT Cancellation Note  Patient Details Name: Lindsay [name] MRN: [id number] DOB: [birth year]   Cancelled Treatment:    Reason Eval/Treat Not Completed: Medical issues which prohibited therapy; patient remains on vent and still lethargic.  PT will follow up.   [name] 05/29/2023, 1:12 PM Sheran Lawless, PT Acute Rehabilitation Services [phone] 05/29/2023

## 2023-05-29 NOTE — Progress Notes (Signed)
OT Cancellation Note  Patient Details Name: Lindsay [name] MRN: [id number] DOB: 1961-01-29   Cancelled Treatment:    Reason Eval/Treat Not Completed: Medical issues which prohibited therapy (patient remains on vent and still lethargic.  OT will follow up.)  Limmie Patricia, OTR/L,CBIS  Supplemental OT - [facility] and [facility] Secure Chat Preferred   05/29/2023, 1:38 PM

## 2023-05-29 NOTE — Procedures (Addendum)
Patient Name: Lindsay [name]  MRN: [id number]  Epilepsy Attending: Charlsie Quest  Referring Physician/Provider: Erick Blinks, MD  Duration: 05/28/2023 2222 to 05/29/2023 2222   Patient history: 62 y.o. female presenting after syncopal episode of unclear etiology. Pt has non-focal neurologic exam with somewhat depressed level of consciousness and CT demonstrates traumatic pattern of right frontal hemorrhage although there is some SAH primarily in the right Sylvian fissure. EEG to evaluate for seizure.    Level of alertness:  lethargic    AEDs during EEG study: None   Technical aspects: This EEG study was done with scalp electrodes positioned according to the 10-20 International system of electrode placement. Electrical activity was reviewed with band Lukin filter of 1-70Hz , sensitivity of 7 uV/mm, display speed of 69mm/sec with a 60Hz  notched filter applied as appropriate. EEG data were recorded continuously and digitally stored.  Video monitoring was available and reviewed as appropriate.   Description: EEG showed continuous generalized and lateralized right hemisphere predominantly 5 to 7 Hz theta slowing admixed with intermittent 2-3Hz  delta slowing. Hyperventilation and photic stimulation were not performed.      ABNORMALITY - Continuous slow, generalized and lateralized right hemisphere    IMPRESSION: This study is suggestive of cortical dysfunction arising from right hemisphere likely secondary to underlying structural abnormality. Additionally there is moderate diffuse encephalopathy, nonspecific etiology. No seizures or epileptiform discharges were seen throughout the recording.   Azariah Latendresse Annabelle Harman

## 2023-05-29 NOTE — Progress Notes (Signed)
NAME:  Lindsay [name], MRN:  [id number], DOB:  1961/07/04, LOS: 7 ADMISSION DATE:  05/22/2023, CONSULTATION DATE:  6/11 REFERRING MD:  Dr. Elpidio Anis, CHIEF COMPLAINT:  Head bleed   History of Present Illness:  Patient is a 62 yo Female w/ pertinent PMH coronary artery vasospasm, DMT2, HTN presents to [facility] ED on 6/11 w/ head bleed.  On 6/11, patient was at work and had syncopal episode falling and hit back of head. Not on any blood thinners at home. EMS called. Found patient HR 160s and was given 6 mg adenosine then given 20 mg cardizem converting back to sinus rhythm.   Upon arrival to [facility] ED, patient hemodynamically stable and afebrile. EKG sinus rhythm. CT head showing hemorrhage in the anterior right frontal lobe, likely contusion, with surrounding edema; Additional SDH and SAH along the right frontal convexity, with 4 mm right-to-left midline shift. NSG consulted recommending SBP goal <150 and Keppra 500 bid. Recommend ICU admission for close neuro monitoring. PCCM consulted.  Pertinent ED labs: ethanol and UDS wnl, trop 9, K 3.1, mag 1.5, glucose 177 CTA chest with no PE; large hiatal hernia; b/l adrenal nodules possible adenomas.  Pertinent  Medical History   Past Medical History:  Diagnosis Date   Allergic rhinitis, cause unspecified    seasonal   Anginal pain (HCC) 2013   Stress   Arthritis    hips, thumb,knees   Diabetes mellitus without complication (HCC)    Essential hypertension, benign 12/12/2011   Joint pain    Multiple drug allergies    Obesity    Wears glasses    Wears partial dentures    upper     Significant Hospital Events: Including procedures, antibiotic start and stop dates in addition to other pertinent events   6/11 syncopal episode then hit head; ct head with head bleed and midline shift 6/15 episodes of SVT, lethargy 6/17 Worsening mental status and right blown pupil this am. Intubated  Interim History / Subjective:   Hypertonic saline held overnight  with Na 155 Partially opens eyes and follows commands UOP 2.2L  Objective   Blood pressure (!) 156/99, pulse (!) 54, temperature 97.7 F (36.5 C), temperature source Axillary, resp. rate (!) 7, height 5' 7.5" (1.715 m), weight 96.5 kg, last menstrual period 03/26/2012, SpO2 100 %.    Vent Mode: PRVC FiO2 (%):  [40 %-100 %] 40 % Set Rate:  [18 bmp] 18 bmp Vt Set:  [500 mL] 500 mL PEEP:  [5 cmH20] 5 cmH20 Plateau Pressure:  [15 cmH20-16 cmH20] 15 cmH20   Intake/Output Summary (Last 24 hours) at 05/29/2023 0803 Last data filed at 05/29/2023 0600 Gross per 24 hour  Intake 1761.76 ml  Output 2250 ml  Net -488.24 ml   Filed Weights   05/26/23 0500 05/28/23 0500 05/29/23 0500  Weight: 100.4 kg 95.3 kg 96.5 kg   Physical Exam: General: Chronically ill-appearing, no acute distress HENT: [place], AT, ETT in place Eyes: EOMI, no scleral icterus Respiratory: Clear to auscultation bilaterally.  No crackles, wheezing or rales Cardiovascular: RRR, -M/R/G, no JVD GI: BS+, soft, nontender Extremities:-Edema,-tenderness Neuro: Opens eyes, right pupil 4 mm left pupil 3 mm non reactive, follows commands including sticking tongue out and squeezing hand bilaterally R>L, wiggles feet   Na 151 K 3.5 CT head pending  Resolved Hospital Problem list   Lactic acidosis  Assessment & Plan:   Large right frontal lobe and mild right anterior temporal lobe hemorrhagic contusions with edema with mass effect/brain  compression Left inferior frontal hemorrhagic contusion with edema Small subdural hemorrhage Posterior scalp lac Acute cortical ischemia throughout right PCA and small areas of acute ischemia of the right thalamus and bilateral midbrain 6/15 Stable 6/16 Hypertonic weaned and discontinued at 0500 6/17 Less responsive>MRI with acute cortical ischemia throughout right PCA and small areas of acute ischemia of the right thalamus and bilateral midbrain. Bilateral R>L hematomas with 7 mm leftward  midline shift, previously 5mm --Neuro following.  --Hypertonic saline for goal 150-155. Naq6h --NSG following --DC Keppra. F/u EEG ---Neuroprotective measures- normothermia, euglycemia, HOB greater than 30, head in neutral alignment, normocapnia, normoxia  Intubated for airway protection --Full vent support. Wean to PS as tolerated --LTVV, 4-8cc/kg IBW with goal Pplat<30 and DP<15 --Extubation precluded by mental status and stroke --Will continue goals of care with family regarding long term goals --VAP  Syncopal episode --Dr. Chestine Spore did previously discuss syncope precautions with her and her son & daughter- no driving x 6 months and avoid high risk activities without supervision-- ladders, swimming, etc.  --Appreciate cardiology input, plans for post stabilization monitoring  SVT --Amiodarone d/c'd 6/14 --Cardizem d/c'd 6/18 due to bradycarda --Tele. Monitor for recurrence  Hypertension Coronary vasospasm --Scheduled hydralazine --PRN hydralazine and labetalol  Hypokalemia --Repleted  History of peripheral neuropathy --Hold gabapentin in setting AMS  History of GERD --On PPI  Adrenal nodules, outpatient follow-up  Best Practice (right click and "Reselect all SmartList Selections" daily)   Diet/type: dysphagia diet (see orders) DVT prophylaxis: SCD GI prophylaxis: PPI Lines: N/A Foley:  N/A Code Status:  full code Last date of multidisciplinary goals of care discussion [6/12 updated daughter and son at bedside] Updated son via telephone on 6/18 regarding MRI results     The patient is critically ill with multiple organ systems failure and requires high complexity decision making for assessment and support, frequent evaluation and titration of therapies, application of advanced monitoring technologies and extensive interpretation of multiple databases.  Independent Critical Care Time: 35 Minutes.   Mechele Collin, M.D. [facility] Pulmonary/Critical Care  Medicine 05/29/2023 8:04 AM   Please see Amion for pager number to reach on-call Pulmonary and Critical Care Team.

## 2023-05-29 NOTE — Progress Notes (Signed)
Inpatient Rehab Admissions Coordinator:    Pt. Continues on vent, inappropriate for CIR admit. CIR will sign off. MD may reconsult once off vent and participating with therapies.   Megan Salon, MS, CCC-SLP Rehab Admissions Coordinator  [phone] (celll) [phone] (office)

## 2023-05-29 NOTE — Progress Notes (Signed)
vLTM maintenance  All impedances below 10kohms  No skin breakdown noted at CZ P3 PZ

## 2023-05-29 NOTE — Progress Notes (Signed)
Initial Nutrition Assessment  DOCUMENTATION CODES:   Not applicable  INTERVENTION:   Change diet to NPO  Initiate tube feeding via OG tube: Osmolite 1.5 at 50 ml/h (1200 ml per day) Prosource TF20 60 ml daily  Provides 1880 kcal, 95 gm protein, 912 ml free water daily   NUTRITION DIAGNOSIS:   Inadequate oral intake related to inability to eat as evidenced by NPO status.  GOAL:   Patient will meet greater than or equal to 90% of their needs  MONITOR:   TF tolerance  REASON FOR ASSESSMENT:   Consult Enteral/tube feeding initiation and management  ASSESSMENT:   Pt with PMH of DM and HTN admitted after syncopal episode leading to fall hitting the back of her head. Pt found to have R frontal contusion with cerebral edema and brain compression with acute SDH and SAH.   Pt discussed during ICU rounds and with RN.  Pt unresponsive 6/17 and intubated; MRI shows acute cortical ischemia throughout R PCA and small areas of acute ischemia of the R thalamus and bilateral midbrain.  Hypertonic saline restarted  Spoke with family, son and daughter in law who are at bedside. Pt live alone. They report that pt has lost weight recently as she was on a medication to help with weight loss. Per chart review pt was 259 lb in 2022. Pt now is 212 lb.   06/11 - admission 06/17 - pt with neuro change; intubated 06/18 - started TF  Medications reviewed and include: colace, SSI, protonix, miralax  Hypertonic saline   Labs reviewed:  Na 151 A1C: 5.7   NUTRITION - FOCUSED PHYSICAL EXAM:  Flowsheet Row Most Recent Value  Orbital Region No depletion  Upper Arm Region No depletion  Thoracic and Lumbar Region No depletion  Buccal Region No depletion  Temple Region No depletion  Clavicle Bone Region No depletion  Clavicle and Acromion Bone Region No depletion  Scapular Bone Region No depletion  Dorsal Hand Unable to assess  Patellar Region No depletion  Anterior Thigh Region No  depletion  Posterior Calf Region No depletion  Edema (RD Assessment) None  Hair Reviewed  Eyes Unable to assess  Mouth Unable to assess  Skin Reviewed  Nails Unable to assess       Diet Order:   Diet Order             DIET DYS 3 Room service appropriate? Yes with Assist; Fluid consistency: Thin  Diet effective now                   EDUCATION NEEDS:   Not appropriate for education at this time  Skin:  Skin Assessment: Reviewed RN Assessment (head laceration)  Last BM:  6/16  Height:   Ht Readings from Last 1 Encounters:  05/25/23 5' 7.5" (1.715 m)    Weight:   Wt Readings from Last 1 Encounters:  05/29/23 96.5 kg    BMI:  Body mass index is 32.83 kg/m.  Estimated Nutritional Needs:   Kcal:  1800-2000  Protein:  95-115 grams  Fluid:  >1.8 L/day  Cammy Copa., RD, LDN, CNSC See AMiON for contact information

## 2023-05-30 ENCOUNTER — Inpatient Hospital Stay (HOSPITAL_COMMUNITY): Payer: 59

## 2023-05-30 DIAGNOSIS — I629 Nontraumatic intracranial hemorrhage, unspecified: Secondary | ICD-10-CM | POA: Diagnosis not present

## 2023-05-30 DIAGNOSIS — I639 Cerebral infarction, unspecified: Secondary | ICD-10-CM

## 2023-05-30 DIAGNOSIS — R569 Unspecified convulsions: Secondary | ICD-10-CM | POA: Diagnosis not present

## 2023-05-30 LAB — BASIC METABOLIC PANEL
Anion gap: 8 (ref 5–15)
BUN: 7 mg/dL — ABNORMAL LOW (ref 8–23)
CO2: 23 mmol/L (ref 22–32)
Calcium: 8.2 mg/dL — ABNORMAL LOW (ref 8.9–10.3)
Chloride: 120 mmol/L — ABNORMAL HIGH (ref 98–111)
Creatinine, Ser: 0.7 mg/dL (ref 0.44–1.00)
GFR, Estimated: >60 mL/min (ref >60)
Glucose, Bld: 127 mg/dL — ABNORMAL HIGH (ref 70–99)
Potassium: 3.4 mmol/L — ABNORMAL LOW (ref 3.5–5.1)
Sodium: 151 mmol/L — ABNORMAL HIGH (ref 135–145)

## 2023-05-30 LAB — CULTURE, RESPIRATORY W GRAM STAIN: Gram Stain: NONE SEEN

## 2023-05-30 LAB — CBC
HCT: 37.5 % (ref 36.0–46.0)
Hemoglobin: 11.6 g/dL — ABNORMAL LOW (ref 12.0–15.0)
MCH: 24 pg — ABNORMAL LOW (ref 26.0–34.0)
MCHC: 30.9 g/dL (ref 30.0–36.0)
MCV: 77.6 fL — ABNORMAL LOW (ref 80.0–100.0)
Platelets: 209 10*3/uL (ref 150–400)
RBC: 4.83 MIL/uL (ref 3.87–5.11)
RDW: 21.6 % — ABNORMAL HIGH (ref 11.5–15.5)
WBC: 6.2 10*3/uL (ref 4.0–10.5)
nRBC: 0 % (ref 0.0–0.2)

## 2023-05-30 LAB — URINALYSIS, ROUTINE W REFLEX MICROSCOPIC
Bilirubin Urine: NEGATIVE
Glucose, UA: NEGATIVE mg/dL
Hgb urine dipstick: NEGATIVE
Ketones, ur: NEGATIVE mg/dL
Leukocytes,Ua: NEGATIVE
Nitrite: NEGATIVE
Protein, ur: NEGATIVE mg/dL
Specific Gravity, Urine: 1.008 (ref 1.005–1.030)
pH: 7 (ref 5.0–8.0)

## 2023-05-30 LAB — SODIUM
Sodium: 156 mmol/L — ABNORMAL HIGH (ref 135–145)
Sodium: 152 mmol/L — ABNORMAL HIGH (ref 135–145)
Sodium: 152 mmol/L — ABNORMAL HIGH (ref 135–145)
Sodium: 157 mmol/L — ABNORMAL HIGH (ref 135–145)

## 2023-05-30 LAB — GLUCOSE, CAPILLARY
Glucose-Capillary: 150 mg/dL — ABNORMAL HIGH (ref 70–99)
Glucose-Capillary: 142 mg/dL — ABNORMAL HIGH (ref 70–99)
Glucose-Capillary: 132 mg/dL — ABNORMAL HIGH (ref 70–99)
Glucose-Capillary: 119 mg/dL — ABNORMAL HIGH (ref 70–99)
Glucose-Capillary: 128 mg/dL — ABNORMAL HIGH (ref 70–99)
Glucose-Capillary: 165 mg/dL — ABNORMAL HIGH (ref 70–99)

## 2023-05-30 LAB — PHOSPHORUS
Phosphorus: 3.4 mg/dL (ref 2.5–4.6)
Phosphorus: 3 mg/dL (ref 2.5–4.6)

## 2023-05-30 LAB — MAGNESIUM
Magnesium: 1.9 mg/dL (ref 1.7–2.4)
Magnesium: 2.2 mg/dL (ref 1.7–2.4)

## 2023-05-30 MED ORDER — POTASSIUM CHLORIDE CRYS ER 20 MEQ PO TBCR
40.0000 meq | EXTENDED_RELEASE_TABLET | Freq: Once | ORAL | Status: DC
  Filled 2023-05-30: qty 2

## 2023-05-30 MED ORDER — ASPIRIN 81 MG PO CHEW
81.0000 mg | CHEWABLE_TABLET | Freq: Every day | ORAL | Status: DC
  Administered 2023-05-30 – 2023-06-07 (×7): 81 mg
  Filled 2023-05-30 (×7): qty 1

## 2023-05-30 MED ORDER — ROSUVASTATIN CALCIUM 20 MG PO TABS
20.0000 mg | ORAL_TABLET | Freq: Every day | ORAL | Status: DC
  Administered 2023-05-31 – 2023-06-07 (×6): 20 mg via NASOGASTRIC
  Filled 2023-05-30 (×6): qty 1

## 2023-05-30 MED ORDER — HYDRALAZINE HCL 25 MG PO TABS
25.0000 mg | ORAL_TABLET | Freq: Once | ORAL | Status: AC
  Administered 2023-05-30: 25 mg
  Filled 2023-05-30: qty 1

## 2023-05-30 MED ORDER — POTASSIUM CHLORIDE 10 MEQ/50ML IV SOLN
10.0000 meq | INTRAVENOUS | Status: AC
  Administered 2023-05-30 (×4): 10 meq via INTRAVENOUS
  Filled 2023-05-30 (×4): qty 50

## 2023-05-30 MED ORDER — MAGNESIUM SULFATE 2 GM/50ML IV SOLN
2.0000 g | Freq: Once | INTRAVENOUS | Status: AC
  Administered 2023-05-30: 2 g via INTRAVENOUS
  Filled 2023-05-30: qty 50

## 2023-05-30 MED ORDER — POTASSIUM CHLORIDE 20 MEQ PO PACK
40.0000 meq | PACK | Freq: Once | ORAL | Status: AC
  Administered 2023-05-30: 40 meq
  Filled 2023-05-30: qty 2

## 2023-05-30 MED ORDER — HYDRALAZINE HCL 50 MG PO TABS
50.0000 mg | ORAL_TABLET | Freq: Three times a day (TID) | ORAL | Status: DC
  Administered 2023-05-30 – 2023-05-31 (×4): 50 mg
  Filled 2023-05-30 (×5): qty 1

## 2023-05-30 NOTE — Evaluation (Signed)
Occupational Therapy Re-Evaluation Patient Details Name: Lindsay [name] MRN: [id number] DOB: May 30, 1961 Today's Date: 05/30/2023   History of Present Illness 62 y.o. female brought to the hospital after she apparently passed out at work, striking the back of her head. CT demonstrates traumatic pattern of right frontal hemorrhage although there is some SAH primarily in the right Sylvian fissure.  Patient became unresponsive on 6/17 and was intubated and found to have areas of new ischemia on MRI in the PCA territory.   Clinical Impression   Due to a change in medical status pt was re-evaluated during OT session. Pt currently vented. She did not follow any commands during session. Eyes remained closed. Did respond to noxious pain stimuli to bilateral nail beds of big toe. Increased tone noted in bilateral upper and lower extremities while resistive to passive ROM although therapist able to passive range. Son arrived at end of session and was updated on therapy session and findings. Pt would benefit from bilateral WHOs and will message MD to order. Posted UE and LE passive ROM exercises behind [facility] and educated Son and Engineer, civil (consulting) on completing. Goals will be modified based on pt's functional performance change. OT continues to recommend intensive inpatient follow up therapy, >3 hours/day and is hopeful that pt will improve once extubated. OT will continue to follow patient acutely and reassess D/C recommendation as necessary.       Recommendations for follow up therapy are one component of a multi-disciplinary discharge planning process, led by the attending physician.  Recommendations may be updated based on patient status, additional functional criteria and insurance authorization.   Assistance Recommended at Discharge Frequent or constant Supervision/Assistance  Patient can return home with the following Two people to help with walking and/or transfers;Two people to help with  bathing/dressing/bathroom;Assistance with feeding;Assist for transportation;Direct supervision/assist for financial management;Direct supervision/assist for medications management;Help with stairs or ramp for entrance;Assistance with cooking/housework    Functional Status Assessment  Patient has had a recent decline in their functional status and demonstrates the ability to make significant improvements in function in a reasonable and predictable amount of time.  Equipment Recommendations  Other (comment) (defer to next venue of care)    Recommendations for Other Services Rehab consult     Precautions / Restrictions Precautions Precautions: Fall Precaution Comments: vent, NG tube Restrictions Weight Bearing Restrictions: No      Mobility Bed Mobility Overal bed mobility:  (Unable to fully assess d/t cognition and lethargy. Pt will require total assist x2 for rolling .)               Patient Response: Flat affect  Transfers Overall transfer level:  (unable to assess due to safety reasons)                        Balance Overall balance assessment:  (Unable to assess d/t to safety reasons)              ADL either performed or assessed with clinical judgement   ADL Overall ADL's : Needs assistance/impaired Eating/Feeding: NPO;Bed level   Grooming: Total assistance;Bed level   Upper Body Bathing: Total assistance;Bed level   Lower Body Bathing: Total assistance;+2 for physical assistance;Bed level   Upper Body Dressing : Total assistance;Bed level   Lower Body Dressing: Total assistance;+2 for physical assistance;Bed level     Toilet Transfer Details (indicate cue type and reason): Unable to assess due to safety reasons Toileting- Clothing Manipulation and Hygiene: Total  assistance;Bed level;+2 for physical assistance                Pertinent Vitals/Pain Pain Assessment Facial Expression: Relaxed, neutral Body Movements: Absence of  movements Muscle Tension: Tense, rigid Compliance with ventilator (intubated pts.): Tolerating ventilator or movement Vocalization (extubated pts.): N/A CPOT Total: 1     Hand Dominance Right   Extremity/Trunk Assessment Upper Extremity Assessment Upper Extremity Assessment: RUE deficits/detail;LUE deficits/detail RUE Deficits / Details: No active movement demonstrated during session. P/ROM completed to shoulder, elbow, wrist, and hand; all ranges. Increased tone noted with all joints while therapist was able to increase ROM with passive stretching. Pt actively resisted P/ROM of shoulder and elbow while slightly relaxing with each repetition. Fingers in flexion at rest able to passively extend. LUE Deficits / Details: No active movement demonstrated during session. P/ROM completed to shoulder, elbow, wrist, and hand; all ranges. Increased tone noted with all joints while therapist was able to increase ROM with passive stretching. Pt actively resisted P/ROM of shoulder and elbow while slightly relaxing with each repetition. Fingers in flexion at rest able to passively extend.   Lower Extremity Assessment Lower Extremity Assessment: RLE deficits/detail;LLE deficits/detail RLE Deficits / Details: No active movement demonstrated during session. Increased tone with P/ROM for hip, knee, and ankle. LLE Deficits / Details: No active movement demonstrated during session. Increased tone with P/ROM for hip, knee, and ankle.       Communication Communication Communication: Receptive difficulties;Expressive difficulties (vented)   Cognition Arousal/Alertness: Lethargic Behavior During Therapy:  (N/A lethargic) Overall Cognitive Status: Difficult to assess     General Comments  PSV - ventilator, 5 PEEP, 40% FiO2, SpO2 100% HR 87-90bpm, 174/78 BP supine at end of session    Exercises General Exercises - Upper Extremity Shoulder Flexion: PROM, Both, 5 reps, Supine Shoulder ABduction: PROM, Both, 5  reps, Supine Shoulder ADduction: PROM, Both, 5 reps, Supine Elbow Flexion: PROM, Both, 5 reps, Supine Elbow Extension: PROM, Both, 5 reps, Supine Wrist Flexion: PROM, Both, 5 reps, Supine Wrist Extension: PROM, Both, 5 reps, Supine Composite Extension: PROM, Both, 5 reps, Supine Low Level/ICU Exercises Hip ABduction/ADduction: PROM, Both, 5 reps, Supine Heel Slides: PROM, 5 reps, Both, Supine Other Exercises Other Exercises: Passive Heel Cord stretch, supine, BLE Other Exercises: P/ROM cervical flexion/extension, right and left lateral rotation, 10X, 1 set, bed level.        Home Living Family/patient expects to be discharged to:: Private residence Living Arrangements: Alone Available Help at Discharge: Family;Available PRN/intermittently Type of Home: Other(Comment) (townhouse) Home Access: Level entry     Home Layout: One level     Bathroom Shower/Tub: Producer, television/film/video: Standard     Home Equipment: Rollator (4 wheels);Shower seat;Cane - single point          Prior Functioning/Environment Prior Level of Function : Driving;Independent/Modified Independent;Working/employed      Mobility Comments: Production designer, theatre/television/film for community distances and cane for in home. ADLs Comments: Mod I-independent level        OT Problem List: Decreased cognition;Decreased knowledge of use of DME or AE;Impaired balance (sitting and/or standing);Decreased activity tolerance;Decreased safety awareness;Decreased range of motion;Decreased strength;Decreased coordination;Increased edema;Impaired tone;Impaired UE functional use      OT Treatment/Interventions: Self-care/ADL training;Therapeutic exercise;Therapeutic activities;Cognitive remediation/compensation;Energy conservation;DME and/or AE instruction;Patient/family education;Balance training;Splinting;Neuromuscular education;Visual/perceptual remediation/compensation    OT Goals(Current goals can be found in the care plan  section) Acute Rehab OT Goals Patient Stated Goal: N/A OT Goal Formulation: Patient unable to participate  in goal setting Time For Goal Achievement: 06/13/23 Potential to Achieve Goals: Good  OT Frequency: Min 2X/week    Co-evaluation PT/OT/SLP Co-Evaluation/Treatment: Yes Reason for Co-Treatment: Complexity of the patient's impairments (multi-system involvement);Necessary to address cognition/behavior during functional activity   OT goals addressed during session: Strengthening/ROM;Other (comment) (cognition)      AM-PAC OT "6 Clicks" Daily Activity     Outcome Measure Help from another person eating meals?: Total Help from another person taking care of personal grooming?: Total Help from another person toileting, which includes using toliet, bedpan, or urinal?: Total Help from another person bathing (including washing, rinsing, drying)?: Total Help from another person to put on and taking off regular upper body clothing?: Total Help from another person to put on and taking off regular lower body clothing?: Total 6 Click Score: 6   End of Session Nurse Communication: Mobility status;Other (comment) (cognitive status, need for bilateral hand splints and PRAFOs.)  Activity Tolerance: Patient limited by fatigue;Patient limited by lethargy Patient left: in bed;with call bell/phone within reach;with family/visitor present  OT Visit Diagnosis: Unsteadiness on feet (R26.81);Muscle weakness (generalized) (M62.81);Other symptoms and signs involving cognitive function;Cognitive communication deficit (R41.841);Other symptoms and signs involving the nervous system (R29.898) Symptoms and signs involving cognitive functions:  (Traumatic SAH)                Time: 1610-9604 OT Time Calculation (min): 17 min Charges:  OT General Charges $OT Visit: 1 Visit OT Evaluation $OT Re-eval: 1 Re-eval  Limmie Patricia, OTR/L,CBIS  Supplemental OT - [facility] and [facility] Secure Chat Preferred    Klarisa Barman,  Charisse March 05/30/2023, 12:17 PM

## 2023-05-30 NOTE — Progress Notes (Signed)
2000 Sodium was 152 while 3% was running at 40 mL/hr.  0200 Sodium was 156.  Contacted Dr. Derry Lory to let him know and ask what he wanted to do with 3% gtt.  VO to decrease 3% infusion to 20 mL/hr.

## 2023-05-30 NOTE — Progress Notes (Signed)
LTM maint complete - no skin breakdown under: Fp1 F3  Serviced O1 P3 [facility] monitored, Event button test confirmed by [facility].

## 2023-05-30 NOTE — Progress Notes (Addendum)
STROKE TEAM PROGRESS NOTE   INTERVAL HISTORY Her family is at the bedside.   Neurological exam is unchanged.  Vital signs are stable except she spiked high fever and cultures have been sent and she has been started on Zosyn Patient with no significant changes this AM.  Patient withdraws all of her extremities except LUE to painful stimulus. Does not follow commands.   She remains on hypertonic saline.  Vitals:   05/30/23 1100 05/30/23 1200 05/30/23 1300 05/30/23 1400  BP: (!) 162/68 (!) 174/77 (!) 174/68 (!) 140/59  Pulse: 66 72 71 66  Resp: (!) 26 (!) 27 (!) 24 (!) 27  Temp:   (!) 101.5 F (38.6 C)   TempSrc:      SpO2: 100% 100% 100% 100%  Weight:      Height:       CBC:  Recent Labs  Lab 05/29/23 0356 05/30/23 0501  WBC 6.0 6.2  NEUTROABS 4.0  --   HGB 10.2* 11.6*  HCT 32.9* 37.5  MCV 77.8* 77.6*  PLT 192 209    Basic Metabolic Panel:  Recent Labs  Lab 05/28/23 0436 05/28/23 0805 05/29/23 0356 05/29/23 0817 05/30/23 0501 05/30/23 0800 05/30/23 1356  NA 145   < > 151*   < > 151* 152* 152*  K 2.9*   < > 3.5  --  3.4*  --   --   CL 109   < > 122*  --  120*  --   --   CO2 21*   < > 22  --  23  --   --   GLUCOSE 138*   < > 94  --  127*  --   --   BUN <5*   < > 9  --  7*  --   --   CREATININE 0.54   < > 0.64  --  0.70  --   --   CALCIUM 8.4*   < > 8.0*  --  8.2*  --   --   MG 1.7  --  2.0  --  1.9  --   --   PHOS 3.1  --   --   --  3.4  --   --    < > = values in this interval not displayed.      IMAGING past 24 hours No results found.  PHYSICAL EXAM  Constitutional: Well-developed, well-nourished middle-aged African-American lady who is intubated and sedated, and in no distress.  HENT:  Head: Normocephalic and atraumatic.  Cardiovascular: Normal rate, regular rhythm, intact distal pulses. Pulmonary: Intubated and sedated  Skin: Skin is warm and dry.    Neurological:   Patient is intubated and sedated.  Patient partially opens eyes with painful  stimulus. Pupils remain unequal. Right pupil 5 mm remains fixed nonreactive.  Left pupil 3 mm sluggishly reactive.  Corneal reflexes are present.  Cough and gag present. No movement in the left upper extremity and trace withdrawal in the left lower extremity.  ASSESSMENT/PLAN Ms. TYIANNA SHAMBLIN is a 62 y.o. female with history of T2DM presenting after syncopal episode found to have R frontal lobe hemorrhage with 4mm shift. Patient noted to have acute mental status change 6/17 AM. Repeat CT head with no significant change. 3% saline tapered 6/16 AM. Patient found to be unresponsive with unequal pupils and MRI brain demonstrated acute R PCA infarct.   Acute R PCA infarct superimposed on recent Right greater than left frontal hemorrhagic contusions few days  prior ICH - Likely in the setting of recent infarct CT head:.  Right frontal lobe hemorrhage with surrounding edema.  4 mm right to left midline shift.  Additional subdural and subarachnoid hemorrhages along the right frontal convexity. CTA head & neck: No LVO or significant stenosis in head or neck. Repeat CT head: Scattered posttraumatic subarachnoid hemorrhage with superimposed hemorrhagic contusions in the anterior right frontal convexity, slightly increased.  Mild mild mass effect with no more than trace right to left shift, stable. Evolving left posterior scalp contusion with laceration. Underlying acute nondisplaced nondepressed occipital skull fracture Repeat CT 6/12 PM: Increase size of hemorrhagic contusion involving anterior right frontal lobe, now measuring 5 x 5 x 3 0.2 x 3.3 cm. Worsening surrounding vasogenic edema and regional mass effect, 5 mm right to left shift now seen. Repeat CT head 6/13 a.m: Stable hemorrhagic contusion with edema.  Stable mass effect with leftward midline shift of 5 to 6 mm.  Mild SAH. CT head 6/15 AM. Stable since 6/13.  CT head 6/17 am. No interval change. Unchanged hemorrhagic contusion in bilateral frontal  lobes. Non hemorrhagic contusion at anterior R temporal lobe. Stable nondisplaced L skull base Fx MRI brain R PCA infarct 6/17 hgbA1c 5.7 2D Echo, EF 60 to 65%, unchanged from prior  LDL 106 HgbA1c 5.7 VTE prophylaxis - SCDs  No antithrombotic prior to admission, now on ASA and SQH for VTE ppx  Continue hypertonic saline, Na 152<143.  Na monitoring goal 150-155 Therapy recommendations:  Pending  Disposition:  ICU   ?Syncope SVT Normal rate 2D echo EF 60 to 65%. No AS or AR. Mod elevated PASP Will need monitor on discharge   Hypertension Home meds:  None Stable, PO hydralazine increased to 50mg  q8h this PM  SBP goal <160 Long-term BP goal normotensive  Hyperlipidemia Home meds:  None LDL 106, goal < 70 Crestor 20mg  via tube  Diabetes type II Controlled Home meds:  Mounjaro  HgbA1c 5.7, goal < 7.0 CBGs Recent Labs    05/30/23 0314 05/30/23 0753 05/30/23 1108  GLUCAP 150* 142* 132*     SSI  Other Active Problems Microcytic anemia: continue to monitor will need further work up in the outpatient setting, follow up iron studies. Last colonoscopy appears to be in 2008, unable to see these results. She will need colonoscopy in the outpatient setting.   Hospital day # 8  Marolyn Haller, MD PGY-3 Internal Medicine Resident  Pager [phone]   I have personally obtained history,examined this patient, reviewed notes, independently viewed imaging studies, participated in medical decision making and plan of care.ROS completed by me personally and pertinent positives fully documented  I have made any additions or clarifications directly to the above note. Agree with note above.  Patient neurological exam remains poor and nauseous spiking fever and likely has infection and has been started on Zosyn.  Continue ventilatory support.  Family wants to pursue full support and may lean towards trach and PEG but this will have to wait till next week.  She has recovered from her infection.   Discussed with Dr. Ortencia Kick critical care medicine.  Discussed with family at the bedside and answered questions.This patient is critically ill and at significant risk of neurological worsening, death and care requires constant monitoring of vital signs, hemodynamics,respiratory and cardiac monitoring, extensive review of multiple databases, frequent neurological assessment, discussion with family, other specialists and medical decision making of high complexity.I have made any additions or clarifications directly to the above note.This  critical care time does not reflect procedure time, or teaching time or supervisory time of PA/NP/Med Resident etc but could involve care discussion time.  I spent 30 minutes of neurocritical care time  in the care of  this patient.      Delia Heady, MD Medical Director [facility] Stroke Center Pager: [phone] 05/30/2023 5:01 PM       Delia Heady, MD Medical Director [facility] Stroke Center Pager: [phone] 05/30/2023 3:03 PM  To contact Stroke Continuity provider, please refer to [email or web address]. After hours, contact General Neurology

## 2023-05-30 NOTE — Progress Notes (Addendum)
NAME:  Lindsay [name], MRN:  [id number], DOB:  Apr 22, 1961, LOS: 8 ADMISSION DATE:  05/22/2023, CONSULTATION DATE:  6/11 REFERRING MD:  Dr. Elpidio Anis, CHIEF COMPLAINT:  Head bleed   History of Present Illness:  Patient is a 62 yo Female w/ pertinent PMH coronary artery vasospasm, DMT2, HTN presents to [facility] ED on 6/11 w/ head bleed.  On 6/11, patient was at work and had syncopal episode falling and hit back of head. Not on any blood thinners at home. EMS called. Found patient HR 160s and was given 6 mg adenosine then given 20 mg cardizem converting back to sinus rhythm.   Upon arrival to [facility] ED, patient hemodynamically stable and afebrile. EKG sinus rhythm. CT head showing hemorrhage in the anterior right frontal lobe, likely contusion, with surrounding edema; Additional SDH and SAH along the right frontal convexity, with 4 mm right-to-left midline shift. NSG consulted recommending SBP goal <150 and Keppra 500 bid. Recommend ICU admission for close neuro monitoring. PCCM consulted.  Pertinent ED labs: ethanol and UDS wnl, trop 9, K 3.1, mag 1.5, glucose 177 CTA chest with no PE; large hiatal hernia; b/l adrenal nodules possible adenomas.  Pertinent  Medical History   Past Medical History:  Diagnosis Date   Allergic rhinitis, cause unspecified    seasonal   Anginal pain (HCC) 2013   Stress   Arthritis    hips, thumb,knees   Diabetes mellitus without complication (HCC)    Essential hypertension, benign 12/12/2011   Joint pain    Multiple drug allergies    Obesity    Wears glasses    Wears partial dentures    upper     Significant Hospital Events: Including procedures, antibiotic start and stop dates in addition to other pertinent events   6/11 syncopal episode then hit head; ct head with head bleed and midline shift 6/15 episodes of SVT, lethargy 6/17 Worsening mental status and right blown pupil this am. Intubated  Interim History / Subjective:   3% saline decreased to 20 cc/h  overnight for Na+ 156 > 151 I/O 1.1 L total, urine output last 24 hours 4 L 0.40, PEEP 5, currently on PSV 8 Currently off sedation   Objective   Blood pressure (!) 159/88, pulse (!) 57, temperature 100.2 F (37.9 C), temperature source Axillary, resp. rate 18, height 5' 7.5" (1.715 m), weight 93.8 kg, last menstrual period 03/26/2012, SpO2 100 %.    Vent Mode: PSV FiO2 (%):  [40 %] 40 % Set Rate:  [18 bmp] 18 bmp Vt Set:  [500 mL] 500 mL PEEP:  [5 cmH20] 5 cmH20 Pressure Support:  [8 cmH20] 8 cmH20 Plateau Pressure:  [13 cmH20-14 cmH20] 13 cmH20   Intake/Output Summary (Last 24 hours) at 05/30/2023 0727 Last data filed at 05/30/2023 0700 Gross per 24 hour  Intake 2265.98 ml  Output 4050 ml  Net -1784.02 ml   Filed Weights   05/28/23 0500 05/29/23 0500 05/30/23 0500  Weight: 95.3 kg 96.5 kg 93.8 kg   Physical Exam: General: Obese woman, ventilated, acute and chronically ill-appearing HENT: ET tube in place Eyes: No icterus Respiratory: Clear bilaterally Cardiovascular: Regular, distant, no murmur GI: Nondistended, positive bowel sounds Extremities:-No edema Neuro: Minimally responsive, tries to open eyes to stimulation, extensor movements upper extremities to pain.  Right pupil 4 mm, left pupil 2 mm, both nonreactive.  Does not follow commands.  Toes upgoing bilaterally   Resolved Hospital Problem list   Lactic acidosis  Assessment & Plan:  Large right frontal lobe and mild right anterior temporal lobe hemorrhagic contusions with edema with mass effect/brain compression Left inferior frontal hemorrhagic contusion with edema Small subdural hemorrhage Posterior scalp lac Acute cortical ischemia throughout right PCA and small areas of acute ischemia of the right thalamus and bilateral midbrain 6/15 Stable 6/16 Hypertonic weaned and discontinued at 0500 6/17 Less responsive>MRI with acute cortical ischemia throughout right PCA and small areas of acute ischemia of the  right thalamus and bilateral midbrain. Bilateral R>L hematomas with 7 mm leftward midline shift, previously 5mm -Continue hypertonic saline for goal Na+ 150-155.  Plan to adjust to 30 cc/h 6/19 and continue to follow every 6 hours -Appreciate neurosurgery and neurology evaluation and management -Keppra has been discontinued, following EEG -Continue neuroprotective measures  Intubated for airway protection -PRVC 8 cc/kg.  Okay for PSV but decision for extubation will be dictated by her stroke/bleed, mental status -Continue to discuss with family regarding long-term goals for care particularly for believe she will be neurologically limited, vent dependent -VAP prevention orders  Syncopal episode -Dr. Chestine Spore did previously discuss syncope precautions with her and her son & daughter- no driving x 6 months and avoid high risk activities without supervision-- ladders, swimming, etc.  -Appreciate cardiology input, plans for post stabilization monitoring  SVT -No longer on amiodarone, diltiazem -Monitor in telemetry for any evidence recurrence  Hypertension Coronary vasospasm -Hydralazine as ordered -Labetalol, hydralazine available if needed  Hypokalemia -Continue to replete when indicated, repeat on 6/19  History of peripheral neuropathy -Continue to hold gabapentin  History of GERD -PPI  Adrenal nodules, outpatient follow-up  Best Practice (right click and "Reselect all SmartList Selections" daily)   Diet/type: dysphagia diet (see orders) DVT prophylaxis: SCD GI prophylaxis: PPI Lines: N/A Foley:  N/A Code Status:  full code Last date of multidisciplinary goals of care discussion 6/12 updated daughter and son at bedside     The patient is critically ill with multiple organ systems failure and requires high complexity decision making for assessment and support, frequent evaluation and titration of therapies, application of advanced monitoring technologies and extensive  interpretation of multiple databases.  Independent Critical Care Time: 31 Minutes.   Levy Pupa, MD, PhD 05/30/2023, 7:34 AM [facility] [phone] or if no answer before 7:00PM call [phone] For any issues after 7:00PM please call eLink [phone]

## 2023-05-30 NOTE — Procedures (Signed)
Patient Name: Lindsay [name]  MRN: [id number]  Epilepsy Attending: Charlsie Quest  Referring Physician/Provider: Erick Blinks, MD  Duration: 05/29/2023 2222 to 05/30/2023 4098   Patient history: 62 y.o. female presenting after syncopal episode of unclear etiology. Pt has non-focal neurologic exam with somewhat depressed level of consciousness and CT demonstrates traumatic pattern of right frontal hemorrhage although there is some SAH primarily in the right Sylvian fissure. EEG to evaluate for seizure.    Level of alertness:  lethargic    AEDs during EEG study: None   Technical aspects: This EEG study was done with scalp electrodes positioned according to the 10-20 International system of electrode placement. Electrical activity was reviewed with band Latterell filter of 1-70Hz , sensitivity of 7 uV/mm, display speed of 12mm/sec with a 60Hz  notched filter applied as appropriate. EEG data were recorded continuously and digitally stored.  Video monitoring was available and reviewed as appropriate.   Description: EEG showed continuous generalized and lateralized right hemisphere predominantly 5 to 7 Hz theta slowing admixed with intermittent 2-3Hz  delta slowing. Hyperventilation and photic stimulation were not performed.      ABNORMALITY - Continuous slow, generalized and lateralized right hemisphere    IMPRESSION: This study is suggestive of cortical dysfunction arising from right hemisphere likely secondary to underlying structural abnormality. Additionally there is moderate diffuse encephalopathy, nonspecific etiology. No seizures or epileptiform discharges were seen throughout the recording.   Trenden Hazelrigg Annabelle Harman

## 2023-05-30 NOTE — Progress Notes (Signed)
[facility] ADULT [facility] REPLACEMENT PROTOCOL   The patient does apply for the [facility] Adult ICU Electrolyte Replacment Protocol based on the criteria listed below:   1.Exclusion criteria: TCTS, ECMO, Dialysis, and Myasthenia Gravis patients 2. Is GFR >/= 30 ml/min? Yes.    Patient's GFR today is >60 3. Is SCr </= 2? Yes.   Patient's SCr is 0.7 mg/dL 4. Did SCr increase >/= 0.5 in 24 hours? No. 5.Pt's weight >40kg  Yes.   6. Abnormal electrolyte(s):   K 3.4, Mg 1.9  7. Electrolytes replaced per protocol 8.  Call MD STAT for K+ </= 2.5, Phos </= 1, or Mag </= 1 Physician:  Shawn Stall R Bobby Ragan 05/30/2023 6:21 AM

## 2023-05-30 NOTE — Progress Notes (Signed)
Lower extremity venous bilateral study completed.   Please see CV Proc for preliminary results.   Brendaliz Kuk, RDMS, RVT  

## 2023-05-30 NOTE — Progress Notes (Addendum)
Pt temp cont to be elevated at 101.9 axillary after tylenol and ice packs. Urine output noted to be increased greater than 2L this shift. Dr. Delton Coombes made aware see new orders for labs/ chest xray.

## 2023-05-30 NOTE — Progress Notes (Signed)
At 1958 Sodium 157.  Dr. Derry Lory notified.  VO to stop 3% infusion.  Will recheck sodium in 6 hrs.

## 2023-05-30 NOTE — Progress Notes (Signed)
SLP Cancellation Note  Patient Details Name: Lindsay [name] MRN: [id number] DOB: 07/18/1961   Cancelled treatment:       Reason Eval/Treat Not Completed: Medical issues which prohibited therapy;Other (comment) (patient remains on vent; SLP will follow for readiness)  Angela Nevin, MA, CCC-SLP Speech Therapy

## 2023-05-30 NOTE — Progress Notes (Signed)
LTM EEG disconnected - no skin breakdown at unhook.  

## 2023-05-30 NOTE — Evaluation (Signed)
Physical Therapy Re-Evaluation Patient Details Name: Lindsay [name] MRN: [id number] DOB: 1961/05/07 Today's Date: 05/30/2023  History of Present Illness  62 y.o. female brought to the hospital after she apparently passed out at work, striking the back of her head. CT demonstrates traumatic pattern of right frontal hemorrhage although there is some SAH primarily in the right Sylvian fissure.  Patient became unresponsive on 6/17 and was intubated and found to have areas of new ischemia on MRI in the PCA territory.  Clinical Impression  Patient presents with decreased responsiveness, increased tone, decreased spontaneous movement and limited activity tolerance.  She responded to pain in all 4 extremities and demonstrated some reflexive pulsitile motor activation (Tremor) in response to stretch to both achilles.  Son arrived during session and was educated in need for PROM with instructions given and posted at head of bed for staff as well.  She will benefit from skilled PT to progress mobility as she improves.  Hopeful for progression to allow intensive inpatient rehab prior to d/c home.         Recommendations for follow up therapy are one component of a multi-disciplinary discharge planning process, led by the attending physician.  Recommendations may be updated based on patient status, additional functional criteria and insurance authorization.  Follow Up Recommendations       Assistance Recommended at Discharge Frequent or constant Supervision/Assistance  Patient can return home with the following  Assistance with cooking/housework;Assist for transportation;Help with stairs or ramp for entrance;Direct supervision/assist for medications management;Two people to help with bathing/dressing/bathroom;Two people to help with walking and/or transfers;Assistance with feeding    Equipment Recommendations Other (comment) (TBA)  Recommendations for Other Services       Functional Status Assessment  Patient has had a recent decline in their functional status and demonstrates the ability to make significant improvements in function in a reasonable and predictable amount of time.     Precautions / Restrictions Precautions Precautions: Fall Precaution Comments: vent, NG tube Restrictions Weight Bearing Restrictions: No      Mobility  Bed Mobility Overal bed mobility: Needs Assistance             General bed mobility comments: up into semi chair position total A,  Repositioned head and arms in supine for promoting neutral positioning and less tone    Transfers                        Ambulation/Gait                  Stairs            Wheelchair Mobility    Modified Rankin (Stroke Patients Only) Modified Rankin (Stroke Patients Only) Pre-Morbid Rankin Score: Moderately severe disability Modified Rankin: Severe disability     Balance       Sitting balance - Comments: NT due to pt on vent, not responding to commands                                     Pertinent Vitals/Pain Pain Assessment Pain Assessment: CPOT Facial Expression: Relaxed, neutral Body Movements: Absence of movements Muscle Tension: Tense, rigid Compliance with ventilator (intubated pts.): Tolerating ventilator or movement Vocalization (extubated pts.): N/A CPOT Total: 1    Home Living Family/patient expects to be discharged to:: Private residence Living Arrangements: Alone Available Help at Discharge: Family;Available PRN/intermittently Type of  Home: Other(Comment) (townhouse) Home Access: Level entry       Home Layout: One level Home Equipment: Rollator (4 wheels);Shower seat;Cane - single point      Prior Function Prior Level of Function : Driving;Independent/Modified Independent;Working/employed             Mobility Comments: Production designer, theatre/television/film for community distances and cane for in home. ADLs Comments: Mod I-independent level     Hand  Dominance   Dominant Hand: Right    Extremity/Trunk Assessment   Upper Extremity Assessment Upper Extremity Assessment: Defer to OT evaluation RUE Deficits / Details: No active movement demonstrated during session. P/ROM completed to shoulder, elbow, wrist, and hand; all ranges. Increased tone noted with all joints while therapist was able to increase ROM with passive stretching. Pt actively resisted P/ROM of shoulder and elbow while slightly relaxing with each repetition. Fingers in flexion at rest able to passively extend. LUE Deficits / Details: No active movement demonstrated during session. P/ROM completed to shoulder, elbow, wrist, and hand; all ranges. Increased tone noted with all joints while therapist was able to increase ROM with passive stretching. Pt actively resisted P/ROM of shoulder and elbow while slightly relaxing with each repetition. Fingers in flexion at rest able to passively extend.    Lower Extremity Assessment Lower Extremity Assessment: RLE deficits/detail;LLE deficits/detail RLE Deficits / Details: No active movement demonstrated during session. Increased tone with P/ROM for hip, knee, and ankle, and limited ankle DF to approx 15 degrees from neutral. LLE Deficits / Details: No active movement demonstrated during session. Increased tone with P/ROM for hip, knee, and ankle and limited ankle DF to approx 15 degrees from neutral.       Communication   Communication: Receptive difficulties;Expressive difficulties (vented)  Cognition Arousal/Alertness: Lethargic   Overall Cognitive Status: Difficult to assess                                          General Comments General comments (skin integrity, edema, etc.): on PS/CPAP 40% FiO2, PEEP 5, SpO2 100%, HR 87-90 bpm, BP 174/78 end of session in supine; son arrived end of session and education with handouts (posted in room) on PROM for UE's and ankle stretch    Exercises General Exercises - Upper  Extremity Shoulder Flexion: PROM, Both, 5 reps, Supine Shoulder ABduction: PROM, Both, 5 reps, Supine Shoulder ADduction: PROM, Both, 5 reps, Supine Elbow Flexion: PROM, Both, 5 reps, Supine Elbow Extension: PROM, Both, 5 reps, Supine Wrist Flexion: PROM, Both, 5 reps, Supine Wrist Extension: PROM, Both, 5 reps, Supine Composite Extension: PROM, Both, 5 reps, Supine Low Level/ICU Exercises Hip ABduction/ADduction: PROM, Both, 5 reps, Supine Heel Slides: PROM, 5 reps, Both, Supine Other Exercises Other Exercises: Passive Heel Cord stretch, supine, BLE Other Exercises: P/ROM cervical flexion/extension, right and left lateral rotation, 10X, 1 set, bed level.   Assessment/Plan    PT Assessment Patient needs continued PT services  PT Problem List Decreased strength;Decreased cognition;Decreased safety awareness;Decreased balance;Decreased activity tolerance;Decreased mobility;Decreased knowledge of use of DME;Impaired tone;Decreased range of motion       PT Treatment Interventions DME instruction;Functional mobility training;Balance training;Patient/family education;Therapeutic exercise;Cognitive remediation;Neuromuscular re-education;Therapeutic activities    PT Goals (Current goals can be found in the Care Plan section)  Acute Rehab PT Goals Patient Stated Goal: to get better PT Goal Formulation: With family Time For Goal Achievement: 06/13/23 Potential to Achieve Goals:  Fair    Frequency Min 3X/week     Co-evaluation PT/OT/SLP Co-Evaluation/Treatment: Yes Reason for Co-Treatment: Complexity of the patient's impairments (multi-system involvement);Necessary to address cognition/behavior during functional activity   OT goals addressed during session: Strengthening/ROM;Other (comment) (cognition)       AM-PAC PT "6 Clicks" Mobility  Outcome Measure Help needed turning from your back to your side while in a flat bed without using bedrails?: Total Help needed moving from lying  on your back to sitting on the side of a flat bed without using bedrails?: Total Help needed moving to and from a bed to a chair (including a wheelchair)?: Total Help needed standing up from a chair using your arms (e.g., wheelchair or bedside chair)?: Total Help needed to walk in hospital room?: Total Help needed climbing 3-5 steps with a railing? : Total 6 Click Score: 6    End of Session Equipment Utilized During Treatment: Other (comment) (vent) Activity Tolerance: Patient limited by lethargy Patient left: in bed   PT Visit Diagnosis: Other abnormalities of gait and mobility (R26.89);Other symptoms and signs involving the nervous system (R29.898);History of falling (Z91.81);Muscle weakness (generalized) (M62.81)    Time: 9147-8295 PT Time Calculation (min) (ACUTE ONLY): 35 min   Charges:   PT Evaluation $PT Re-evaluation: 1 Re-eval          Sheran Lawless, PT Acute Rehabilitation Services Office:[phone] 05/30/2023   [name] 05/30/2023, 1:26 PM

## 2023-05-31 ENCOUNTER — Inpatient Hospital Stay (HOSPITAL_COMMUNITY): Payer: 59

## 2023-05-31 DIAGNOSIS — I629 Nontraumatic intracranial hemorrhage, unspecified: Secondary | ICD-10-CM | POA: Diagnosis not present

## 2023-05-31 LAB — BLOOD CULTURE ID PANEL (REFLEXED) - BCID2

## 2023-05-31 LAB — BASIC METABOLIC PANEL
Anion gap: 7 (ref 5–15)
BUN: 7 mg/dL — ABNORMAL LOW (ref 8–23)
CO2: 27 mmol/L (ref 22–32)
Calcium: 8.4 mg/dL — ABNORMAL LOW (ref 8.9–10.3)
Chloride: 123 mmol/L — ABNORMAL HIGH (ref 98–111)
Creatinine, Ser: 0.62 mg/dL (ref 0.44–1.00)
GFR, Estimated: >60 mL/min (ref >60)
Glucose, Bld: 144 mg/dL — ABNORMAL HIGH (ref 70–99)
Potassium: 3.3 mmol/L — ABNORMAL LOW (ref 3.5–5.1)
Sodium: 157 mmol/L — ABNORMAL HIGH (ref 135–145)

## 2023-05-31 LAB — CBC
HCT: 39.2 % (ref 36.0–46.0)
Hemoglobin: 11.8 g/dL — ABNORMAL LOW (ref 12.0–15.0)
MCH: 23.7 pg — ABNORMAL LOW (ref 26.0–34.0)
MCHC: 30.1 g/dL (ref 30.0–36.0)
MCV: 78.9 fL — ABNORMAL LOW (ref 80.0–100.0)
Platelets: 209 10*3/uL (ref 150–400)
RBC: 4.97 MIL/uL (ref 3.87–5.11)
RDW: 22 % — ABNORMAL HIGH (ref 11.5–15.5)
WBC: 7.5 10*3/uL (ref 4.0–10.5)
nRBC: 0 % (ref 0.0–0.2)

## 2023-05-31 LAB — PHOSPHORUS
Phosphorus: 3.9 mg/dL (ref 2.5–4.6)
Phosphorus: 3.2 mg/dL (ref 2.5–4.6)

## 2023-05-31 LAB — MAGNESIUM
Magnesium: 2.2 mg/dL (ref 1.7–2.4)
Magnesium: 2.1 mg/dL (ref 1.7–2.4)

## 2023-05-31 LAB — CULTURE, BLOOD (ROUTINE X 2): Special Requests: ADEQUATE

## 2023-05-31 LAB — GLUCOSE, CAPILLARY
Glucose-Capillary: 129 mg/dL — ABNORMAL HIGH (ref 70–99)
Glucose-Capillary: 110 mg/dL — ABNORMAL HIGH (ref 70–99)
Glucose-Capillary: 116 mg/dL — ABNORMAL HIGH (ref 70–99)
Glucose-Capillary: 117 mg/dL — ABNORMAL HIGH (ref 70–99)
Glucose-Capillary: 135 mg/dL — ABNORMAL HIGH (ref 70–99)
Glucose-Capillary: 152 mg/dL — ABNORMAL HIGH (ref 70–99)

## 2023-05-31 LAB — IRON AND TIBC
Iron: 40 ug/dL (ref 28–170)
Saturation Ratios: 17 % (ref 10.4–31.8)
TIBC: 234 ug/dL — ABNORMAL LOW (ref 250–450)
UIBC: 194 ug/dL

## 2023-05-31 LAB — SODIUM
Sodium: 158 mmol/L — ABNORMAL HIGH (ref 135–145)
Sodium: 156 mmol/L — ABNORMAL HIGH (ref 135–145)
Sodium: 154 mmol/L — ABNORMAL HIGH (ref 135–145)
Sodium: 156 mmol/L — ABNORMAL HIGH (ref 135–145)

## 2023-05-31 LAB — FERRITIN: Ferritin: 45 ng/mL (ref 11–307)

## 2023-05-31 LAB — CULTURE, RESPIRATORY W GRAM STAIN

## 2023-05-31 MED ORDER — ACETAMINOPHEN 325 MG PO TABS
650.0000 mg | ORAL_TABLET | ORAL | Status: DC | PRN
  Administered 2023-05-31 – 2023-06-01 (×3): 650 mg
  Filled 2023-05-31 (×3): qty 2

## 2023-05-31 MED ORDER — POTASSIUM CHLORIDE 10 MEQ/50ML IV SOLN
10.0000 meq | INTRAVENOUS | Status: AC
  Administered 2023-05-31 (×4): 10 meq via INTRAVENOUS
  Filled 2023-05-31 (×4): qty 50

## 2023-05-31 MED ORDER — POTASSIUM CHLORIDE 20 MEQ PO PACK
40.0000 meq | PACK | Freq: Once | ORAL | Status: AC
  Administered 2023-05-31: 40 meq
  Filled 2023-05-31: qty 2

## 2023-05-31 MED ORDER — POTASSIUM CHLORIDE 20 MEQ PO PACK
20.0000 meq | PACK | ORAL | Status: AC
  Administered 2023-05-31 (×2): 20 meq
  Filled 2023-05-31 (×2): qty 1

## 2023-05-31 NOTE — Progress Notes (Signed)
Orthopedic Tech Progress Note Patient Details:  Lindsay [name] 07/15/1961 [id number] Called in order to [name]. Patient ID: Lindsay [name], female   DOB: 1961/07/31, 62 y.o.   MRN: [id number]  [name] 05/31/2023, 9:37 AM

## 2023-05-31 NOTE — Progress Notes (Signed)
NAME:  Lindsay [name], MRN:  [id number], DOB:  Dec 02, 1961, LOS: 9 ADMISSION DATE:  05/22/2023, CONSULTATION DATE:  6/11 REFERRING MD:  Dr. Elpidio Anis, CHIEF COMPLAINT:  Head bleed   History of Present Illness:  Patient is a 62 yo Female w/ pertinent PMH coronary artery vasospasm, DMT2, HTN presents to [facility] ED on 6/11 w/ head bleed.  On 6/11, patient was at work and had syncopal episode falling and hit back of head. Not on any blood thinners at home. EMS called. Found patient HR 160s and was given 6 mg adenosine then given 20 mg cardizem converting back to sinus rhythm.   Upon arrival to [facility] ED, patient hemodynamically stable and afebrile. EKG sinus rhythm. CT head showing hemorrhage in the anterior right frontal lobe, likely contusion, with surrounding edema; Additional SDH and SAH along the right frontal convexity, with 4 mm right-to-left midline shift. NSG consulted recommending SBP goal <150 and Keppra 500 bid. Recommend ICU admission for close neuro monitoring. PCCM consulted.  Pertinent ED labs: ethanol and UDS wnl, trop 9, K 3.1, mag 1.5, glucose 177 CTA chest with no PE; large hiatal hernia; b/l adrenal nodules possible adenomas.  Pertinent  Medical History   Past Medical History:  Diagnosis Date   Allergic rhinitis, cause unspecified    seasonal   Anginal pain (HCC) 2013   Stress   Arthritis    hips, thumb,knees   Diabetes mellitus without complication (HCC)    Essential hypertension, benign 12/12/2011   Joint pain    Multiple drug allergies    Obesity    Wears glasses    Wears partial dentures    upper     Significant Hospital Events: Including procedures, antibiotic start and stop dates in addition to other pertinent events   6/11 syncopal episode then hit head; ct head with head bleed and midline shift 6/15 episodes of SVT, lethargy 6/17 Worsening mental status and right blown pupil this am. Intubated  Interim History / Subjective:   Sodium 157 on Most recent  labs I/O -3.8 L total, urine output 4780 cc last 24 hours 0.40, PEEP 5.  Tolerated PSV on 6/19 Sedation is off  Objective   Blood pressure 120/62, pulse 61, temperature 99.2 F (37.3 C), temperature source Axillary, resp. rate 18, height 5' 7.5" (1.715 m), weight 90.8 kg, last menstrual period 03/26/2012, SpO2 100 %.    Vent Mode: PRVC FiO2 (%):  [40 %] 40 % Set Rate:  [18 bmp] 18 bmp Vt Set:  [500 mL] 500 mL PEEP:  [5 cmH20] 5 cmH20 Pressure Support:  [8 cmH20] 8 cmH20 Plateau Pressure:  [18 cmH20-19 cmH20] 18 cmH20   Intake/Output Summary (Last 24 hours) at 05/31/2023 7829 Last data filed at 05/31/2023 0600 Gross per 24 hour  Intake 1949.98 ml  Output 4780 ml  Net -2830.02 ml   Filed Weights   05/29/23 0500 05/30/23 0500 05/31/23 0500  Weight: 96.5 kg 93.8 kg 90.8 kg   Physical Exam: General: Obese chronically ill-appearing woman, ventilated HENT: ET tube in place, large tongue, no secretions, edentulous Eyes: No icterus Respiratory: Clear bilaterally Cardiovascular: Distant, regular, no murmur GI: Nondistended with positive bowel sounds Extremities: No edema Neuro: Grimace with stimulation, does not open eyes, right pupil 4 mm, left pupil 2 mm and nonreactive, withdraws upper extremities to pain.  Toes upgoing bilaterally and hyperreflexic   Resolved Hospital Problem list   Lactic acidosis  Assessment & Plan:   Large right frontal lobe and mild right anterior  temporal lobe hemorrhagic contusions with edema with mass effect/brain compression Left inferior frontal hemorrhagic contusion with edema Small subdural hemorrhage Posterior scalp lac Acute cortical ischemia throughout right PCA and small areas of acute ischemia of the right thalamus and bilateral midbrain 6/16 Hypertonic weaned and discontinued at 0500 6/17 Less responsive>MRI with acute cortical ischemia throughout right PCA and small areas of acute ischemia of the right thalamus and bilateral midbrain.  Bilateral R>L hematomas with 7 mm leftward midline shift, previously 5mm -Continue to use 3% saline for goal Na+ 150-155.  Will hold this morning and continue to follow every 6 hours -Appreciate neurology, neurosurgery evaluation and management -Currently off Keppra, following EEG -Continue neuroprotective measures  Intubated for airway protection -PRVC 8 cc/kg.  Tolerates PSV but not a candidate for extubation due to impaired airway protection. -Will continue to discuss with family regarding long-term goals for care particularly for believe she will be neurologically limited, vent dependent.  Unless there is significant improvement in mental status and airway protection suspect she would require tracheostomy -VAP prevention orders in place  Syncopal episode -Dr. Chestine Spore did previously discuss syncope precautions with her and her son & daughter- no driving x 6 months and avoid high risk activities without supervision-- ladders, swimming, etc.  -Appreciate cardiology input, plans for post stabilization monitoring  SVT -Amiodarone, diltiazem have been discontinued -Continue to follow telemetry for any evidence of recurrence  Hypertension Coronary vasospasm -Hydralazine as ordered -Labetalol, hydralazine available if needed  Hypokalemia -Replete again on 6/20  History of peripheral neuropathy -Gabapentin is on hold  History of GERD -PPI as ordered  Adrenal nodules, outpatient follow-up  Best Practice (right click and "Reselect all SmartList Selections" daily)   Diet/type: dysphagia diet (see orders) DVT prophylaxis: SCD GI prophylaxis: PPI Lines: N/A Foley:  N/A Code Status:  full code Last date of multidisciplinary goals of care discussion 6/12 updated daughter and son at bedside     The patient is critically ill with multiple organ systems failure and requires high complexity decision making for assessment and support, frequent evaluation and titration of therapies,  application of advanced monitoring technologies and extensive interpretation of multiple databases.  Independent Critical Care Time: 31 Minutes.   Levy Pupa, MD, PhD 05/31/2023, 7:07 AM [facility] [phone] or if no answer before 7:00PM call [phone] For any issues after 7:00PM please call eLink [phone]

## 2023-05-31 NOTE — Progress Notes (Signed)
Pt placed on PS/CPAP 5/5 on 40% and is tolerating well. RT will monitor. 

## 2023-05-31 NOTE — Progress Notes (Addendum)
STROKE TEAM PROGRESS NOTE   INTERVAL HISTORY Her family is not at the bedside.   Neurological exam remains unchanged.  Patient febrile yesterday and this AM. Bcx with staph epi 1/2. Respiratory cx pending  Patient withdraws all of her extremities except LUE to painful stimulus. Does not follow commands.   Hypertonic saline was held this AM due to elevated sodium.  She has been on hypertonic saline since 6/17 with no appreciable improvement in her neurological status.   CTH this AM showed unchanged R>L hemorrhagic contusions of anterior frontal lobes. Evolution of L R PCA infarct. Unchanged 9mm  Leftward midline shift and R uncal herniation  Vitals:   05/31/23 1200 05/31/23 1300 05/31/23 1415 05/31/23 1532  BP: (!) 147/58 (!) 144/58 136/63   Pulse: 71 73    Resp: 18 18    Temp: 99.8 F (37.7 C)     TempSrc: Axillary     SpO2: 99% 97%  97%  Weight:      Height:       CBC:  Recent Labs  Lab 05/29/23 0356 05/30/23 0501 05/31/23 0500  WBC 6.0 6.2 7.5  NEUTROABS 4.0  --   --   HGB 10.2* 11.6* 11.8*  HCT 32.9* 37.5 39.2  MCV 77.8* 77.6* 78.9*  PLT 192 209 209    Basic Metabolic Panel:  Recent Labs  Lab 05/30/23 0501 05/30/23 0800 05/30/23 1656 05/30/23 1958 05/31/23 0500 05/31/23 0800 05/31/23 1419  NA 151*   < >  --    < > 157* 156* 154*  K 3.4*  --   --   --  3.3*  --   --   CL 120*  --   --   --  123*  --   --   CO2 23  --   --   --  27  --   --   GLUCOSE 127*  --   --   --  144*  --   --   BUN 7*  --   --   --  7*  --   --   CREATININE 0.70  --   --   --  0.62  --   --   CALCIUM 8.2*  --   --   --  8.4*  --   --   MG 1.9  --  2.2  --  2.2  --   --   PHOS 3.4  --  3.0  --  3.9  --   --    < > = values in this interval not displayed.   No results for input(s): "ETH" in the last 168 hours.   IMAGING past 24 hours CT HEAD WO CONTRAST ( )  Result Date: 05/31/2023 CLINICAL DATA:  Subarachnoid hemorrhage [facility]) Mental status change, unknown cause. EXAM: CT HEAD  WITHOUT CONTRAST TECHNIQUE: Contiguous axial images were obtained from the base of the skull through the vertex without intravenous contrast. RADIATION DOSE REDUCTION: This exam was performed according to the departmental dose-optimization program which includes automated exposure control, adjustment of the mA and/or kV according to patient size and/or use of iterative reconstruction technique. COMPARISON:  Head CT 05/28/2023.  MRI brain 05/28/2023. FINDINGS: Brain: Unchanged appearance of the right-greater-than-left hemorrhagic contusions of the anterior frontal lobes. Interval evolution of the now subacute large right PCA territory infarct with increased sulcal effacement. No evidence of hemorrhagic conversion. Unchanged 9 mm of leftward midline shift and right uncal herniation (axial image 10 series 3). No  hydrocephalus or extra-axial collection. Vascular: No hyperdense vessel or unexpected calcification. Skull: No calvarial fracture or suspicious bone lesion. Skull base is unremarkable. Sinuses/Orbits: Unchanged complete opacification of the right frontal and right maxillary sinuses. Near-complete opacification of the left maxillary sinus. Moderate mucosal disease in the left sphenoid sinus. Orbits are unremarkable. Other: None. IMPRESSION: 1. Unchanged appearance of the right-greater-than-left hemorrhagic contusions of the anterior frontal lobes. 2. Interval evolution of the now subacute large right PCA territory infarct with increased sulcal effacement. No evidence of hemorrhagic conversion. 3. Unchanged 9 mm of leftward midline shift and right uncal herniation. Electronically Signed   By: Orvan Falconer M.D.   On: 05/31/2023 12:37   DG Chest Port 1 View  Result Date: 05/31/2023 CLINICAL DATA:  Provided history: Respiratory failure. EXAM: PORTABLE CHEST 1 VIEW COMPARISON:  Prior chest radiograph 05/28/2023 and earlier. Chest CT 05/22/2023. FINDINGS: ET tube present with tip 3 cm above the level of the  carina. Right sided PICC with tip at the level of the superior cavoatrial junction. An enteric tube loops within a hiatal hernia and terminates in the expected location of the gastric body. Heart size within normal limits. Aortic atherosclerosis. Mild prominence of the interstitial lung markings suggesting interstitial edema. Minimal ill-defined opacity within the medial right lung base. No appreciable airspace consolidation on the left. No evidence of pleural effusion or pneumothorax. IMPRESSION: 1. Support apparatus as described. 2. Mild prominence of the interstitial lung markings suggesting interstitial edema. 3. Mild ill-defined opacity within the right lung base right lung base with an appearance favoring atelectasis. Attention recommended on radiographic follow-up. 4.  Aortic Atherosclerosis (ICD10-I70.0). Electronically Signed   By: Jackey Loge D.O.   On: 05/31/2023 08:43    PHYSICAL EXAM  Constitutional: Well-developed, well-nourished middle-aged woman who is intubated and sedated, and in no distress.  HENT:  Head: Normocephalic and atraumatic.  Cardiovascular: Normal rate, regular rhythm, intact distal pulses. Pulmonary: Intubated Skin: Skin is warm and dry.    Neurological: Unchanged from day prior  Patient is intubated and sedated.  Patient partially opens eyes with painful stimulus. Pupils remain unequal. Right pupil 5 mm remains fixed nonreactive.  Left pupil 3 mm sluggishly reactive.  Corneal reflexes are present.  Cough and gag present. No movement in the left upper extremity and trace withdrawal in the left lower extremity.  ASSESSMENT/PLAN Lindsay [name] is a 62 y.o. female with history of T2DM presenting after syncopal episode found to have R frontal lobe hemorrhage with 4mm shift. Patient noted to have acute mental status change 6/17 AM. Repeat CT head with no significant change. 3% saline tapered 6/16 AM. Patient found to be unresponsive with unequal pupils and MRI brain  demonstrated acute R PCA infarct. Her neuro status has not improved despite hypertonic saline since 6/17. 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  Right frontal lobe hemorrhage with surrounding edema.  4 mm right to left midline shift.  Additional subdural and subarachnoid hemorrhages along the right frontal convexity. CTA head & neck: No LVO or significant stenosis in head or neck. Repeat CT head: Scattered posttraumatic subarachnoid hemorrhage with superimposed hemorrhagic contusions in the anterior right frontal convexity, slightly increased.  Mild mild mass effect with no more than trace right to left  shift, stable. Evolving left posterior scalp contusion with laceration. Underlying acute nondisplaced nondepressed occipital skull fracture Repeat CT 6/12 PM: Increase size of hemorrhagic contusion involving anterior right frontal lobe, now measuring 5 x 5 x 3 0.2 x 3.3 cm. Worsening surrounding vasogenic edema and regional mass effect, 5 mm right to left shift now seen. Repeat CT head 6/13 a.m: Stable hemorrhagic contusion with edema.  Stable mass effect with leftward midline shift of 5 to 6 mm.  Mild SAH. CT head 6/15 AM. Stable since 6/13.  CT head 6/17 am. No interval change. Unchanged hemorrhagic contusion in bilateral frontal lobes. Non hemorrhagic contusion at anterior R temporal lobe. Stable nondisplaced L skull base Fx MRI brain R PCA infarct 6/17 hgbA1c 5.7 2D Echo, EF 60 to 65%, unchanged from prior  LDL 106 HgbA1c 5.7 VTE prophylaxis - SCDs  No antithrombotic prior to admission, now on ASA and SQH for VTE ppx  Continue hypertonic saline.Na monitoring goal 150-155 GOC: Will need to discuss with son, regarding how they wish to proceed.  Disposition:  ICU    ?Syncope SVT Normal rate 2D echo EF 60 to 65%. No AS or AR. Mod elevated PASP Will need monitor on discharge   Hypertension Home meds:  None Stable, PO hydralazine increased to 50mg  q8h this PM  SBP goal <160 Long-term BP goal normotensive  Hyperlipidemia Home meds:  None LDL 106, goal < 70 Crestor 20mg  via tube  Diabetes type II Controlled Home meds:  Mounjaro  HgbA1c 5.7, goal < 7.0 CBGs Recent Labs    05/31/23 0318 05/31/23 0740 05/31/23 1238  GLUCAP 129* 110* 116*     SSI  Other Active Problems Microcytic anemia: continue to monitor will need further work up in the outpatient setting, follow up iron studies. Last colonoscopy appears to be in 2008, unable to see these results. She will need colonoscopy in the outpatient setting.   Hospital day # 9  Marolyn Haller, MD PGY-3 Internal Medicine Resident  Pager [phone]  STROKE MD NOTE :  I have personally obtained history,examined this patient, reviewed notes, independently viewed imaging studies, participated in medical decision making and plan of care.ROS completed by me personally and pertinent positives fully documented  I have made any additions or clarifications directly to the above note. Agree with note above.  Patient neurological exam continues to be poor despite now several days of hypertonic saline with serum sodium above goal and follow-up CT scan from today shows stable appearance with persistent cytotoxic edema and midline shift.  She continues to require ventilatory support for respiratory failure and remains comatose and unresponsive.  No family at the bedside.  Prognosis very poor.  Discussed with Dr. Corliss Blacker critical care medicine..  Family wishes to pursue support but patient is not a good candidate for long-term ventilator support, tracheostomy, PEG tube is recovery is likely going to be very slim   .  Delia Heady, MD Medical Director [facility] Pager: [phone] 05/31/2023 4:43  PM   To contact Stroke Continuity provider, please refer to [email or web address]. After hours, contact General Neurology

## 2023-05-31 NOTE — Progress Notes (Signed)
   05/31/23 1959  Vent Select  Invasive or Noninvasive Invasive  Adult Vent Y  Airway 7.5 mm  Placement Date/Time: 05/28/23 1340   Grade View: Grade 1  Placed By: ICU physician  Laryngoscope Blade: MAC;3  ETT Types: Oral  Size (mm): 7.5 mm  Cuffed: Cuffed  Insertion attempts: 1  Airway Equipment: Stylet;Video Laryngoscope  Placement Confirmati...  Secured at (cm) 25 cm  Measured From Lips  Secured Location Center  Secured By Commercial Tube Holder  Tube Holder Repositioned Yes  Prone position No  Cuff Pressure (cm H2O) Green OR 18-26 CmH2O  Site Condition Cool  Adult Ventilator Settings  Vent Type Servo i  Humidity HME  Vent Mode PRVC  Vt Set 450 mL  Set Rate 15 bmp  FiO2 (%) 40 %  I Time 0.9 Sec(s)  PEEP 5 cmH20  Adult Ventilator Measurements  Peak Airway Pressure 14 L/min  Mean Airway Pressure 7 cmH20  Plateau Pressure 14 cmH20  Resp Rate Spontaneous 5 br/min  Resp Rate Total 20 br/min  Exhaled Vt 435 mL  Measured Ve 7.8 L  I:E Ratio Measured 1:2.5  Auto PEEP 0 cmH20  Total PEEP 5 cmH20  Adult Ventilator Alarms  Alarms On Y  Ve High Alarm 21 L/min  Ve Low Alarm 4 L/min  Resp Rate High Alarm 38 br/min  Resp Rate Low Alarm 10  PEEP Low Alarm 3 cmH2O  Press High Alarm 40 cmH2O  T Apnea 20 sec(s)  VAP Prevention  HOB> 30 Degrees Y  Equipment wiped down Yes  Daily Weaning Assessment  Daily Assessment of Readiness to Wean (S)  Wean protocol criteria met (SBT performed) (pt weaned all day)  Breath Sounds  Bilateral Breath Sounds Clear;Diminished  Vent Respiratory Assessment  Respiratory Pattern Regular;Unlabored  Airway Suctioning/Secretions  Suction Type ETT  Suction Device  Catheter  Secretion Amount Small  Secretion Color White  Secretion Consistency Thick;Thin  Suction Tolerance Tolerated well  Suctioning Adverse Effects None   Pt weaned all day on 6/20 placed pt on full support mode to rest for the night

## 2023-05-31 NOTE — Hospital Course (Signed)
Withdraws LE to pain. Pupils remain unequal. R is fixed 5mm. L sluggish.

## 2023-05-31 NOTE — Progress Notes (Signed)
PHARMACY - PHYSICIAN COMMUNICATION CRITICAL VALUE ALERT - BLOOD CULTURE IDENTIFICATION (BCID)    Assessment:  Lindsay [name] is an 62 y.o. female who presented to [facility] on 05/20/2023 with a chief complaint of head bleed with midline shift. Patient has been spiking fevers since 6/18 despite antipyretics. TA growing GPCs in pairs and chains, CXR with possible atelectasis. WBC WNL. VSS.   6/19 Bcx: 1/2 bottles GPCs in clusters>>identified as Staphylococcus epidermidis without resistance on BCID   Name of physician (or Provider) Contacted: Dr. Delton Coombes   Current antibiotics: none   Changes to prescribed antibiotics recommended: none- likely represents a contaminant Recommendations accepted by provider  Results for orders placed or performed during the hospital encounter of 05/19/2023  Blood Culture ID Panel (Reflexed) (Collected: 05/30/2023  4:56 PM)  Result Value Ref Range   Enterococcus faecalis NOT DETECTED NOT DETECTED   Enterococcus Faecium NOT DETECTED NOT DETECTED   Listeria monocytogenes NOT DETECTED NOT DETECTED   Staphylococcus species DETECTED (A) NOT DETECTED   Staphylococcus aureus (BCID) NOT DETECTED NOT DETECTED   Staphylococcus epidermidis DETECTED (A) NOT DETECTED   Staphylococcus lugdunensis NOT DETECTED NOT DETECTED   Streptococcus species NOT DETECTED NOT DETECTED   Streptococcus agalactiae NOT DETECTED NOT DETECTED   Streptococcus pneumoniae NOT DETECTED NOT DETECTED   Streptococcus pyogenes NOT DETECTED NOT DETECTED   A.calcoaceticus-baumannii NOT DETECTED NOT DETECTED   Bacteroides fragilis NOT DETECTED NOT DETECTED   Enterobacterales NOT DETECTED NOT DETECTED   Enterobacter cloacae complex NOT DETECTED NOT DETECTED   Escherichia coli NOT DETECTED NOT DETECTED   Klebsiella aerogenes NOT DETECTED NOT DETECTED   Klebsiella oxytoca NOT DETECTED NOT DETECTED   Klebsiella pneumoniae NOT DETECTED NOT DETECTED   Proteus species NOT DETECTED NOT DETECTED   Salmonella  species NOT DETECTED NOT DETECTED   Serratia marcescens NOT DETECTED NOT DETECTED   Haemophilus influenzae NOT DETECTED NOT DETECTED   Neisseria meningitidis NOT DETECTED NOT DETECTED   Pseudomonas aeruginosa NOT DETECTED NOT DETECTED   Stenotrophomonas maltophilia NOT DETECTED NOT DETECTED   Candida albicans NOT DETECTED NOT DETECTED   Candida auris NOT DETECTED NOT DETECTED   Candida glabrata NOT DETECTED NOT DETECTED   Candida krusei NOT DETECTED NOT DETECTED   Candida parapsilosis NOT DETECTED NOT DETECTED   Candida tropicalis NOT DETECTED NOT DETECTED   Cryptococcus neoformans/gattii NOT DETECTED NOT DETECTED   Methicillin resistance mecA/C NOT DETECTED NOT DETECTED    Jani Gravel, PharmD PGY-2 Infectious Diseases Resident  05/31/2023 12:03 PM

## 2023-05-31 NOTE — Progress Notes (Signed)
[facility] ADULT ICU REPLACEMENT PROTOCOL   The patient does apply for the [facility] Adult ICU Electrolyte Replacment Protocol based on the criteria listed below:   1.Exclusion criteria: TCTS, ECMO, Dialysis, and Myasthenia Gravis patients 2. Is GFR >/= 30 ml/min? Yes.    Patient's GFR today is >60 3. Is SCr </= 2? Yes.   Patient's SCr is 0.62 mg/dL 4. Did SCr increase >/= 0.5 in 24 hours? No. 5.Pt's weight >40kg  Yes.   6. Abnormal electrolyte(s):   K 3.3  7. Electrolytes replaced per protocol 8.  Call MD STAT for K+ </= 2.5, Phos </= 1, or Mag </= 1 Physician:  Shawn Stall R Laria Grimmett 05/31/2023 6:30 AM

## 2023-05-31 NOTE — Progress Notes (Signed)
Pt transported on vent to CT and back to 4N16 without complications.

## 2023-06-01 DIAGNOSIS — I629 Nontraumatic intracranial hemorrhage, unspecified: Secondary | ICD-10-CM | POA: Diagnosis not present

## 2023-06-01 LAB — GLUCOSE, CAPILLARY
Glucose-Capillary: 151 mg/dL — ABNORMAL HIGH (ref 70–99)
Glucose-Capillary: 117 mg/dL — ABNORMAL HIGH (ref 70–99)
Glucose-Capillary: 138 mg/dL — ABNORMAL HIGH (ref 70–99)
Glucose-Capillary: 100 mg/dL — ABNORMAL HIGH (ref 70–99)
Glucose-Capillary: 110 mg/dL — ABNORMAL HIGH (ref 70–99)
Glucose-Capillary: 108 mg/dL — ABNORMAL HIGH (ref 70–99)

## 2023-06-01 LAB — SODIUM
Sodium: 161 mmol/L (ref 135–145)
Sodium: 159 mmol/L — ABNORMAL HIGH (ref 135–145)

## 2023-06-01 LAB — BASIC METABOLIC PANEL
Anion gap: 12 (ref 5–15)
Anion gap: 9 (ref 5–15)
BUN: 13 mg/dL (ref 8–23)
BUN: 14 mg/dL (ref 8–23)
CO2: 25 mmol/L (ref 22–32)
CO2: 26 mmol/L (ref 22–32)
Calcium: 8.7 mg/dL — ABNORMAL LOW (ref 8.9–10.3)
Calcium: 8.5 mg/dL — ABNORMAL LOW (ref 8.9–10.3)
Chloride: 121 mmol/L — ABNORMAL HIGH (ref 98–111)
Chloride: 123 mmol/L — ABNORMAL HIGH (ref 98–111)
Creatinine, Ser: 0.78 mg/dL (ref 0.44–1.00)
Creatinine, Ser: 0.72 mg/dL (ref 0.44–1.00)
GFR, Estimated: >60 mL/min (ref >60)
GFR, Estimated: >60 mL/min (ref >60)
Glucose, Bld: 141 mg/dL — ABNORMAL HIGH (ref 70–99)
Glucose, Bld: 113 mg/dL — ABNORMAL HIGH (ref 70–99)
Potassium: 3.9 mmol/L (ref 3.5–5.1)
Potassium: 3.6 mmol/L (ref 3.5–5.1)
Sodium: 158 mmol/L — ABNORMAL HIGH (ref 135–145)
Sodium: 158 mmol/L — ABNORMAL HIGH (ref 135–145)

## 2023-06-01 LAB — CBC
HCT: 39.4 % (ref 36.0–46.0)
Hemoglobin: 11.6 g/dL — ABNORMAL LOW (ref 12.0–15.0)
MCH: 23.6 pg — ABNORMAL LOW (ref 26.0–34.0)
MCHC: 29.4 g/dL — ABNORMAL LOW (ref 30.0–36.0)
MCV: 80.2 fL (ref 80.0–100.0)
Platelets: 217 10*3/uL (ref 150–400)
RBC: 4.91 MIL/uL (ref 3.87–5.11)
RDW: 22.4 % — ABNORMAL HIGH (ref 11.5–15.5)
WBC: 8.7 10*3/uL (ref 4.0–10.5)
nRBC: 0 % (ref 0.0–0.2)

## 2023-06-01 LAB — CULTURE, BLOOD (ROUTINE X 2)

## 2023-06-01 MED ORDER — SODIUM CHLORIDE 0.9 % IV BOLUS
500.0000 mL | Freq: Once | INTRAVENOUS | Status: AC
  Administered 2023-06-01: 500 mL via INTRAVENOUS

## 2023-06-01 MED ORDER — ACETAMINOPHEN 650 MG RE SUPP
650.0000 mg | RECTAL | Status: DC | PRN
  Administered 2023-06-01 (×2): 650 mg via RECTAL
  Filled 2023-06-01 (×2): qty 1

## 2023-06-01 MED ORDER — FREE WATER
100.0000 mL | Status: DC
  Administered 2023-06-01 (×2): 100 mL

## 2023-06-01 MED ORDER — NOREPINEPHRINE 4 MG/250ML-% IV SOLN
0.0000 ug/min | INTRAVENOUS | Status: AC
  Filled 2023-06-01: qty 250

## 2023-06-01 NOTE — Progress Notes (Addendum)
STROKE TEAM PROGRESS NOTE   INTERVAL HISTORY Her family is not at the bedside.   Neurological exam remains unchanged.  Despite hypertonic saline.   Hypertonic saline was held yesterday AM due to elevated sodium.  She has been on hypertonic saline since 6/17 with no appreciable improvement in her neurological status.   Patient is febrile this Am and has been intermittently febrile since 6/18. Her blood pressures are becoming soft. No leukocytosis   CTH yesterday showed unchanged R>L hemorrhagic contusions of anterior frontal lobes. Evolution of L R PCA infarct. Unchanged 9mm  Leftward midline shift and R uncal herniation  Vitals:   06/01/23 1100 06/01/23 1115 06/01/23 1141 06/01/23 1155  BP: (!) 178/70 (!) 96/49    Pulse: (!) 117 86    Resp: (!) 30 15    Temp:    (!) 101.8 F (38.8 C)  TempSrc:    Axillary  SpO2: 99% 100% 100%   Weight:      Height:       CBC:  Recent Labs  Lab 05/29/23 0356 05/30/23 0501 05/31/23 0500 06/01/23 0615  WBC 6.0   < > 7.5 8.7  NEUTROABS 4.0  --   --   --   HGB 10.2*   < > 11.8* 11.6*  HCT 32.9*   < > 39.2 39.4  MCV 77.8*   < > 78.9* 80.2  PLT 192   < > 209 217   < > = values in this interval not displayed.    Basic Metabolic Panel:  Recent Labs  Lab 05/31/23 0500 05/31/23 0800 05/31/23 1653 05/31/23 2012 06/01/23 0615 06/01/23 0758  NA 157*   < >  --    < > 158* 159*  K 3.3*  --   --   --  3.9  --   CL 123*  --   --   --  121*  --   CO2 27  --   --   --  25  --   GLUCOSE 144*  --   --   --  141*  --   BUN 7*  --   --   --  13  --   CREATININE 0.62  --   --   --  0.78  --   CALCIUM 8.4*  --   --   --  8.7*  --   MG 2.2  --  2.1  --   --   --   PHOS 3.9  --  3.2  --   --   --    < > = values in this interval not displayed.   No results for input(s): "ETH" in the last 168 hours.   IMAGING past 24 hours No results found.  PHYSICAL EXAM  Constitutional: Well-developed, well-nourished middle-aged woman who is intubated and  sedated, and in no distress.  HENT:  Head: Normocephalic and atraumatic.  Cardiovascular: Normal rate, regular rhythm, intact distal pulses. Pulmonary: Intubated Skin: Skin is warm and dry.    Neurological: Unchanged from day prior  Patient is intubated and sedated.  Patient partially opens eyes with painful stimulus. Pupils remain unequal. Right pupil 5 mm remains fixed nonreactive.  Left pupil 3 mm sluggishly reactive.  Corneal reflexes are present.  Cough and gag present. No movement in the left upper extremity and trace withdrawal in the left lower extremity.  ASSESSMENT/PLAN [name] is a 62 y.o. female with history of T2DM presenting after syncopal episode found to  have R frontal lobe hemorrhage with 4mm shift. Patient noted to have acute mental status change 6/17 AM. Repeat CT head with no significant change. 3% saline tapered 6/16 AM. Patient found to be unresponsive with unequal pupils and MRI brain demonstrated acute R PCA infarct. Her neuro status has not improved despite hypertonic saline since 6/17. 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  Right frontal lobe hemorrhage with surrounding edema.  4 mm right to left midline shift.  Additional subdural and subarachnoid hemorrhages along the right frontal convexity. CTA head & neck: No LVO or significant stenosis in head or neck. Repeat CT head: Scattered posttraumatic subarachnoid hemorrhage with superimposed hemorrhagic contusions in the anterior right frontal convexity, slightly increased.  Mild mild mass effect with no more than trace right to left shift, stable. Evolving left posterior scalp contusion with laceration. Underlying acute nondisplaced nondepressed occipital skull fracture Repeat CT 6/12 PM:  Increase size of hemorrhagic contusion involving anterior right frontal lobe, now measuring 5 x 5 x 3 0.2 x 3.3 cm. Worsening surrounding vasogenic edema and regional mass effect, 5 mm right to left shift now seen. Repeat CT head 6/13 a.m: Stable hemorrhagic contusion with edema.  Stable mass effect with leftward midline shift of 5 to 6 mm.  Mild SAH. CT head 6/15 AM. Stable since 6/13.  CT head 6/17 am. No interval change. Unchanged hemorrhagic contusion in bilateral frontal lobes. Non hemorrhagic contusion at anterior R temporal lobe. Stable nondisplaced L skull base Fx MRI brain R PCA infarct 6/17 hgbA1c 5.7 2D Echo, EF 60 to 65%, unchanged from prior  LDL 106 HgbA1c 5.7 VTE prophylaxis - SCDs  No antithrombotic prior to admission, now on ASA and SQH for VTE ppx  Hold hypertonic saline  GOC: Will need to discuss with son, regarding how they wish to proceed.  Disposition:  ICU   ?Syncope SVT Normal rate 2D echo EF 60 to 65%. No AS or AR. Mod elevated PASP Will need monitor on discharge   Hypertension Home meds:  None Stable, PO hydralazine increased to 50mg  q8h this PM  SBP goal <160 Long-term BP goal normotensive  Hyperlipidemia Home meds:  None LDL 106, goal < 70 Crestor 20mg  via tube  Diabetes type II Controlled Home meds:  Mounjaro  HgbA1c 5.7, goal < 7.0 CBGs Recent Labs    06/01/23 0311 06/01/23 0739 06/01/23 1145  GLUCAP 151* 117* 138*     SSI  Other Active Problems Persistent fever No leukocytosis. Unable to state if any symptoms. Possibly neurogenic. Per primary  Microcytic anemia: continue to monitor will need further work up in the outpatient setting, follow up iron studies. Last colonoscopy appears to be in 2008, unable to see these results. She will need colonoscopy in the outpatient setting.   Hospital day # 10  Lindsay Haller, MD PGY-3 Internal Medicine Resident  Pager [phone] STROKE MD NOTE :  I have personally obtained history,examined  this patient, reviewed notes, independently viewed imaging studies, participated in medical decision making and plan of care.ROS completed by me personally and pertinent positives fully documented  I have made any additions or clarifications directly to the above note. Agree with note above.  Patient neurological exam remains poor despite being on hypertonic saline drip now for 3 days  and follow-up CT scan shows no significant worsening of brain edema or herniation.  Prognosis is very guarded.  I had a long discussion with the patient's son at the bedside about her poor prognosis and chances of surviving without prolonged life support and, tracheostomy, PEG tube and 24-hour nursing care being negligible.  He agrees to DNR and do not escalate but would like to discuss with other family members and make definitive decisions about her care in the next few days.  Discussed with Dr. Corliss Blacker critical care medicine This patient is critically ill and at significant risk of neurological worsening, death and care requires constant monitoring of vital signs, hemodynamics,respiratory and cardiac monitoring, extensive review of multiple databases, frequent neurological assessment, discussion with family, other specialists and medical decision making of high complexity.I have made any additions or clarifications directly to the above note.This critical care time does not reflect procedure time, or teaching time or supervisory time of PA/NP/Med Resident etc but could involve care discussion time.  I spent 30 minutes of neurocritical care time  in the care of  this patient.     Delia Heady, MD Medical Director [facility] Pager: [phone] 06/01/2023 1:42 PM  To contact Stroke Continuity provider, please refer to [email or web address]. After hours, contact General Neurology

## 2023-06-01 NOTE — Progress Notes (Signed)
Pt placed on PS/CPAP 5/5 on 40% and is tolerating well. RT will monitor. 

## 2023-06-01 NOTE — Progress Notes (Signed)
Occupational Therapy Treatment Patient Details Name: Lindsay [name] MRN: [id number] DOB: 1961-01-16 Today's Date: 06/01/2023   History of present illness 62 y.o. female brought to the hospital after she apparently passed out at work, striking the back of her head. CT demonstrates traumatic pattern of right frontal hemorrhage although there is some SAH primarily in the right Sylvian fissure.  Patient became unresponsive on 6/17 and was intubated and found to have areas of new ischemia on MRI in the PCA territory.   OT comments  MD in to see patient, spoke with son to discuss poor prognosis and to discuss goals of care.  OT fit B upper extremity hand splints this date, and will remove in two hours.  OT will continue efforts if appropriate and based on goals of care.  Unclear what disposition will be post acute.     Recommendations for follow up therapy are one component of a multi-disciplinary discharge planning process, led by the attending physician.  Recommendations may be updated based on patient status, additional functional criteria and insurance authorization.    Assistance Recommended at Discharge Frequent or constant Supervision/Assistance  Patient can return home with the following  Two people to help with walking and/or transfers;Two people to help with bathing/dressing/bathroom;Assistance with feeding;Assist for transportation;Direct supervision/assist for financial management;Direct supervision/assist for medications management;Help with stairs or ramp for entrance;Assistance with cooking/housework   Equipment Recommendations       Recommendations for Other Services      Precautions / Restrictions Precautions Precautions: Fall Precaution Comments: vent, NG tube Restrictions Weight Bearing Restrictions: No       Mobility Bed Mobility Overal bed mobility: Needs Assistance Bed Mobility: Rolling Rolling: Total assist, +2 for physical assistance              Transfers                          Balance                                           ADL either performed or assessed with clinical judgement   ADL                                         General ADL Comments: Total assist for all care    Extremity/Trunk Assessment Upper Extremity Assessment RUE Deficits / Details: No active movement demonstrated during session. P/ROM completed to shoulder, elbow, wrist, and hand; all ranges. LUE Deficits / Details: No active movement demonstrated during session. P/ROM completed to shoulder, elbow, wrist, and hand; all ranges.   Lower Extremity Assessment Lower Extremity Assessment: Defer to PT evaluation        Vision       Perception     Praxis      Cognition Arousal/Alertness: Lethargic Behavior During Therapy: Flat affect Overall Cognitive Status: Difficult to assess                 Rancho Levels of Cognitive Functioning Rancho Mirant Scales of Cognitive Functioning: No Response                 Ranch[place] Mirant Scales of Cognitive Functioning: No Response      Exercises  Shoulder Instructions       General Comments      Pertinent Vitals/ Pain       Pain Assessment Pain Assessment: CPOT Facial Expression: Relaxed, neutral Body Movements: Absence of movements Muscle Tension: Relaxed Compliance with ventilator (intubated pts.): Coughing but tolerating Vocalization (extubated pts.): N/A CPOT Total: 1 Pain Intervention(s): Monitored during session  Home Living                                          Prior Functioning/Environment              Frequency           Progress Toward Goals  OT Goals(current goals can now be found in the care plan section)  Progress towards OT goals: Not progressing toward goals - comment  Acute Rehab OT Goals OT Goal Formulation: Patient unable to participate in goal setting Time For Goal Achievement:  06/13/23 Potential to Achieve Goals: Poor  Plan Discharge plan needs to be updated    Co-evaluation                 AM-PAC OT "6 Clicks" Daily Activity     Outcome Measure   Help from another person eating meals?: Total Help from another person taking care of personal grooming?: Total Help from another person toileting, which includes using toliet, bedpan, or urinal?: Total Help from another person bathing (including washing, rinsing, drying)?: Total Help from another person to put on and taking off regular upper body clothing?: Total Help from another person to put on and taking off regular lower body clothing?: Total 6 Click Score: 6    End of Session    OT Visit Diagnosis: Muscle weakness (generalized) (M62.81);Other symptoms and signs involving cognitive function;Cognitive communication deficit (R41.841);Other symptoms and signs involving the nervous system (R29.898)   Activity Tolerance Other (comment) (Decreased LOA)   Patient Left in bed;with family/visitor present   Nurse Communication Other (comment) (Patient coughed out NG tube)        Time: 1130-1150 OT Time Calculation (min): 20 min  Charges: OT General Charges $OT Visit: 1 Visit OT Treatments $Orthotics Fit/Training: 8-22 mins  06/01/2023  RP, OTR/L  Acute Rehabilitation Services  Office:  [phone]   Suzanna Obey 06/01/2023, 11:58 AM

## 2023-06-01 NOTE — Progress Notes (Signed)
NAME:  Lindsay [name], MRN:  [id number], DOB:  Jul 28, 1961, LOS: 10 ADMISSION DATE:  05/20/2023, CONSULTATION DATE:  6/11 REFERRING MD:  Dr. Elpidio Anis, CHIEF COMPLAINT:  Head bleed   History of Present Illness:  Patient is a 62 yo Female w/ pertinent PMH coronary artery vasospasm, DMT2, HTN presents to [facility] ED on 6/11 w/ head bleed.  On 6/11, patient was at work and had syncopal episode falling and hit back of head. Not on any blood thinners at home. EMS called. Found patient HR 160s and was given 6 mg adenosine then given 20 mg cardizem converting back to sinus rhythm.   Upon arrival to [facility] ED, patient hemodynamically stable and afebrile. EKG sinus rhythm. CT head showing hemorrhage in the anterior right frontal lobe, likely contusion, with surrounding edema; Additional SDH and SAH along the right frontal convexity, with 4 mm right-to-left midline shift. NSG consulted recommending SBP goal <150 and Keppra 500 bid. Recommend ICU admission for close neuro monitoring. PCCM consulted.  Pertinent ED labs: ethanol and UDS wnl, trop 9, K 3.1, mag 1.5, glucose 177 CTA chest with no PE; large hiatal hernia; b/l adrenal nodules possible adenomas.  Pertinent  Medical History   Past Medical History:  Diagnosis Date   Allergic rhinitis, cause unspecified    seasonal   Anginal pain (HCC) 2013   Stress   Arthritis    hips, thumb,knees   Diabetes mellitus without complication (HCC)    Essential hypertension, benign 12/12/2011   Joint pain    Multiple drug allergies    Obesity    Wears glasses    Wears partial dentures    upper     Significant Hospital Events: Including procedures, antibiotic start and stop dates in addition to other pertinent events   6/11 syncopal episode then hit head; ct head with head bleed and midline shift 6/15 episodes of SVT, lethargy 6/17 Worsening mental status and right blown pupil this am. Intubated 619 blood culture 1 of 2 Staph hominis, Staph  epidermidis  Interim History / Subjective:   No significant neurological changes reported Sodium 159 I/O- 3.2 L total Tolerating PSV currently Sedation remains off  Objective   Blood pressure (!) 96/49, pulse 86, temperature 98.4 F (36.9 C), temperature source Axillary, resp. rate 15, height 5' 7.5" (1.715 m), weight 90.8 kg, last menstrual period 03/26/2012, SpO2 100 %.    Vent Mode: PSV;CPAP FiO2 (%):  [40 %] 40 % Set Rate:  [15 bmp] 15 bmp Vt Set:  [450 mL] 450 mL PEEP:  [5 cmH20] 5 cmH20 Pressure Support:  [5 cmH20] 5 cmH20 Plateau Pressure:  [14 cmH20] 14 cmH20   Intake/Output Summary (Last 24 hours) at 06/01/2023 1120 Last data filed at 06/01/2023 1000 Gross per 24 hour  Intake 2008.06 ml  Output 1300 ml  Net 708.06 ml   Filed Weights   05/29/23 0500 05/30/23 0500 05/31/23 0500  Weight: 96.5 kg 93.8 kg 90.8 kg   Physical Exam: General: Chronically ill-appearing woman, ventilated HENT: Edentulous, large tongue, ET tube in place Eyes: No icterus Respiratory: Coarse bilaterally Cardiovascular: Tachycardic, distant, regular GI: Obese, nondistended with positive bowel sounds Extremities: No edema Neuro: Right pupil 4 mm, left pupil 2 mm, nonreactive.  Does not open eyes to voice, pain.  Some grimace with stimulation.  Positive cough, gag and respiratory drive.  Did not move upper extremities today.  Hyperreflexic upgoing toes bilaterally  Resolved Hospital Problem list   Lactic acidosis  Assessment & Plan:  Large right frontal lobe and mild right anterior temporal lobe hemorrhagic contusions with edema with mass effect/brain compression Left inferior frontal hemorrhagic contusion with edema Small subdural hemorrhage Posterior scalp lac Acute cortical ischemia throughout right PCA and small areas of acute ischemia of the right thalamus and bilateral midbrain 6/16 Hypertonic weaned and discontinued at 0500 6/17 Less responsive>MRI with acute cortical ischemia  throughout right PCA and small areas of acute ischemia of the right thalamus and bilateral midbrain. Bilateral R>L hematomas with 7 mm leftward midline shift, previously 5mm -3% saline held for sodium 161 > 159.  Free water added overnight.  Continue to follow frequent sodium -Appreciate neurology, neurosurgery evaluation and management -Currently off Keppra -Continue neuroprotective measures  Intubated for airway protection -PRVC 8 cc/kg.  Continue PSV ad lib.  She is not a candidate for extubation due to impaired airway protection and encephalopathy.  Question whether she may require tracheostomy depending on long-term goals.  Will need to continue discussed with family -VAP prevention order set in place  Syncopal episode -Dr. Chestine Spore did previously discuss syncope precautions with her and her son & daughter- no driving x 6 months and avoid high risk activities without supervision-- ladders, swimming, etc.  -Appreciate cardiology input, plans for post stabilization monitoring  Staph epidermidis blood culture 1 of 2 -Suspect that this is contaminant, following clinically off antibiotics -Recheck cultures if fever, any evidence infection or clinical decline.  No leukocytosis noted  SVT -Off amiodarone, diltiazem -Continue to follow telemetry for any evidence of recurrence  Hypertension Coronary vasospasm -Hydralazine as ordered -Labetalol, hydralazine available if needed  Hypokalemia -Following BMP  History of peripheral neuropathy -Gabapentin is on hold  History of GERD -PPI as ordered  Adrenal nodules, outpatient follow-up  Best Practice (right click and "Reselect all SmartList Selections" daily)   Diet/type: dysphagia diet (see orders) DVT prophylaxis: SCD GI prophylaxis: PPI Lines: N/A Foley:  N/A Code Status:  full code Last date of multidisciplinary goals of care discussion 6/12 updated daughter and son at bedside Updated patient's son at bedside 6/21     The  patient is critically ill with multiple organ systems failure and requires high complexity decision making for assessment and support, frequent evaluation and titration of therapies, application of advanced monitoring technologies and extensive interpretation of multiple databases.  Independent Critical Care Time: 32 Minutes.   Levy Pupa, MD, PhD 06/01/2023, 11:20 AM [facility] [phone] or if no answer before 7:00PM call [phone] For any issues after 7:00PM please call eLink [phone]

## 2023-06-01 NOTE — Progress Notes (Signed)
eLink Physician-Brief Progress Note Patient Name: Lindsay [name] DOB: Aug 10, 1961 MRN: [id number]   Date of Service  06/01/2023  HPI/Events of Note  BP dropped to 77/47, MAP 57, HR 65 after receiving a dose of iv Fentanyl.  eICU Interventions  NS 500 ml iv bolus x 1, Levophed gtt also ordered onto the MAP for hypotension that does not respond to fluid bolus.        [name] 06/01/2023, 3:08 AM

## 2023-06-01 NOTE — Progress Notes (Signed)
PT Cancellation Note  Patient Details Name: Lindsay [name] MRN: [id number] DOB: [birth year]   Cancelled Treatment:    Reason Eval/Treat Not Completed: Other (comment); spoke with RN who reports pt may go comfort.  PT will cancel for today and follow up Monday for goals of care.    [name] 06/01/2023, 2:18 PM Sheran Lawless, PT Acute Rehabilitation Services Office:[phone] 06/01/2023

## 2023-06-01 NOTE — Progress Notes (Signed)
Pt coughed OG tube out. Per Dr. Delton Coombes not replacing tube at this time as awaiting family decision.

## 2023-06-01 NOTE — Progress Notes (Signed)
eLink Physician-Brief Progress Note Patient Name: Lindsay [name] DOB: March 18, 1961 MRN: [id number]   Date of Service  06/01/2023  HPI/Events of Note  Serum sodium 158  eICU Interventions  Per bedside RN, no need to treat serum sodium, patient is transitioning to comfort measures in the next 24 hours.        Thomasene Lot [name] 06/01/2023, 9:04 PM

## 2023-06-02 DIAGNOSIS — S061X9A Traumatic cerebral edema with loss of consciousness of unspecified duration, initial encounter: Secondary | ICD-10-CM | POA: Diagnosis not present

## 2023-06-02 DIAGNOSIS — Z66 Do not resuscitate: Secondary | ICD-10-CM | POA: Diagnosis not present

## 2023-06-02 DIAGNOSIS — I471 Supraventricular tachycardia, unspecified: Secondary | ICD-10-CM | POA: Diagnosis not present

## 2023-06-02 DIAGNOSIS — R55 Syncope and collapse: Secondary | ICD-10-CM | POA: Diagnosis not present

## 2023-06-02 DIAGNOSIS — I629 Nontraumatic intracranial hemorrhage, unspecified: Secondary | ICD-10-CM | POA: Diagnosis not present

## 2023-06-02 DIAGNOSIS — I639 Cerebral infarction, unspecified: Secondary | ICD-10-CM

## 2023-06-02 DIAGNOSIS — G9341 Metabolic encephalopathy: Secondary | ICD-10-CM | POA: Diagnosis not present

## 2023-06-02 DIAGNOSIS — Z23 Encounter for immunization: Secondary | ICD-10-CM | POA: Diagnosis not present

## 2023-06-02 LAB — PHOSPHORUS: Phosphorus: 3.6 mg/dL (ref 2.5–4.6)

## 2023-06-02 LAB — BASIC METABOLIC PANEL
Anion gap: 9 (ref 5–15)
BUN: 14 mg/dL (ref 8–23)
CO2: 27 mmol/L (ref 22–32)
Calcium: 8.4 mg/dL — ABNORMAL LOW (ref 8.9–10.3)
Chloride: 123 mmol/L — ABNORMAL HIGH (ref 98–111)
Creatinine, Ser: 0.72 mg/dL (ref 0.44–1.00)
GFR, Estimated: >60 mL/min (ref >60)
Glucose, Bld: 122 mg/dL — ABNORMAL HIGH (ref 70–99)
Potassium: 3.3 mmol/L — ABNORMAL LOW (ref 3.5–5.1)
Sodium: 159 mmol/L — ABNORMAL HIGH (ref 135–145)

## 2023-06-02 LAB — CBC
HCT: 38.2 % (ref 36.0–46.0)
Hemoglobin: 11.2 g/dL — ABNORMAL LOW (ref 12.0–15.0)
MCH: 23.7 pg — ABNORMAL LOW (ref 26.0–34.0)
MCHC: 29.3 g/dL — ABNORMAL LOW (ref 30.0–36.0)
MCV: 80.8 fL (ref 80.0–100.0)
Platelets: 219 10*3/uL (ref 150–400)
RBC: 4.73 MIL/uL (ref 3.87–5.11)
RDW: 22.3 % — ABNORMAL HIGH (ref 11.5–15.5)
WBC: 7.7 10*3/uL (ref 4.0–10.5)
nRBC: 0 % (ref 0.0–0.2)

## 2023-06-02 LAB — GLUCOSE, CAPILLARY
Glucose-Capillary: 116 mg/dL — ABNORMAL HIGH (ref 70–99)
Glucose-Capillary: 85 mg/dL (ref 70–99)
Glucose-Capillary: 87 mg/dL (ref 70–99)
Glucose-Capillary: 82 mg/dL (ref 70–99)

## 2023-06-02 LAB — MAGNESIUM: Magnesium: 2.1 mg/dL (ref 1.7–2.4)

## 2023-06-02 LAB — CULTURE, BLOOD (ROUTINE X 2): Culture: NO GROWTH

## 2023-06-02 MED ORDER — SODIUM CHLORIDE 0.9 % IV BOLUS
500.0000 mL | Freq: Once | INTRAVENOUS | Status: AC
  Administered 2023-06-02: 500 mL via INTRAVENOUS

## 2023-06-02 MED ORDER — POTASSIUM CHLORIDE 10 MEQ/50ML IV SOLN
10.0000 meq | INTRAVENOUS | Status: AC
  Administered 2023-06-02 (×4): 10 meq via INTRAVENOUS
  Filled 2023-06-02 (×4): qty 50

## 2023-06-02 MED ORDER — ADENOSINE 6 MG/2ML IV SOLN
INTRAVENOUS | Status: AC
  Administered 2023-06-02: 6 mg
  Filled 2023-06-02: qty 2

## 2023-06-02 MED ORDER — PHENYLEPHRINE HCL-NACL 20-0.9 MG/250ML-% IV SOLN
0.0000 ug/min | INTRAVENOUS | Status: DC
  Administered 2023-06-02: 20 ug/min via INTRAVENOUS
  Administered 2023-06-03: 10 ug/min via INTRAVENOUS
  Administered 2023-06-04: 30 ug/min via INTRAVENOUS
  Administered 2023-06-05: 20 ug/min via INTRAVENOUS
  Administered 2023-06-05: 70 ug/min via INTRAVENOUS
  Administered 2023-06-06: 40 ug/min via INTRAVENOUS
  Administered 2023-06-06: 20 ug/min via INTRAVENOUS
  Administered 2023-06-07: 70 ug/min via INTRAVENOUS
  Administered 2023-06-07: 100 ug/min via INTRAVENOUS
  Administered 2023-06-07: 80 ug/min via INTRAVENOUS
  Filled 2023-06-02 (×9): qty 250

## 2023-06-02 MED ORDER — ADENOSINE 6 MG/2ML IV SOLN
INTRAVENOUS | Status: AC
  Administered 2023-06-02: 12 mg
  Filled 2023-06-02: qty 4

## 2023-06-02 MED ORDER — AMIODARONE HCL IN DEXTROSE 360-4.14 MG/200ML-% IV SOLN
30.0000 mg/h | INTRAVENOUS | Status: DC
  Administered 2023-06-02 – 2023-06-04 (×4): 30 mg/h via INTRAVENOUS
  Filled 2023-06-02 (×4): qty 200

## 2023-06-02 MED ORDER — PHENYLEPHRINE HCL-NACL 20-0.9 MG/250ML-% IV SOLN
INTRAVENOUS | Status: AC
  Filled 2023-06-02: qty 250

## 2023-06-02 MED ORDER — POTASSIUM CHLORIDE 20 MEQ PO PACK: 20.0000 meq | PACK | ORAL | Status: DC

## 2023-06-02 MED ORDER — AMIODARONE LOAD VIA INFUSION
150.0000 mg | Freq: Once | INTRAVENOUS | Status: AC
  Filled 2023-06-02: qty 83.34

## 2023-06-02 MED ORDER — AMIODARONE HCL IN DEXTROSE 360-4.14 MG/200ML-% IV SOLN
INTRAVENOUS | Status: AC
  Filled 2023-06-02: qty 200

## 2023-06-02 MED ORDER — AMIODARONE HCL IN DEXTROSE 360-4.14 MG/200ML-% IV SOLN
60.0000 mg/h | INTRAVENOUS | Status: DC
  Administered 2023-06-02 (×2): 60 mg/h via INTRAVENOUS
  Filled 2023-06-02: qty 200

## 2023-06-02 MED ORDER — SODIUM CHLORIDE 0.9 % IV BOLUS: 500.0000 mL | Freq: Once | INTRAVENOUS | Status: AC

## 2023-06-02 MED ORDER — SODIUM CHLORIDE 0.9 % IV SOLN: INTRAVENOUS | Status: DC | PRN

## 2023-06-02 MED ORDER — VASOPRESSIN 20 UNITS/100 ML INFUSION FOR SHOCK: 0.0000 [IU]/min | INTRAVENOUS | Status: DC

## 2023-06-02 MED ORDER — ADENOSINE 6 MG/2ML IV SOLN
INTRAVENOUS | Status: AC
  Filled 2023-06-02: qty 4

## 2023-06-02 MED ORDER — AMIODARONE IV BOLUS ONLY 150 MG/100ML
150.0000 mg | Freq: Once | INTRAVENOUS | Status: DC
  Administered 2023-06-02: 150 mg via INTRAVENOUS
  Filled 2023-06-02: qty 100

## 2023-06-02 NOTE — Progress Notes (Signed)
NAME:  Lindsay [name], MRN:  [id number], DOB:  [birth year], LOS: 11 ADMISSION DATE:  05/20/2023, CONSULTATION DATE:  6/11 REFERRING MD:  Dr. Elpidio Anis, CHIEF COMPLAINT:  Head bleed   History of Present Illness:  Patient is a 62 yo Female w/ pertinent PMH coronary artery vasospasm, DMT2, HTN presents to [facility] ED on 6/11 w/ head bleed.  On 6/11, patient was at work and had syncopal episode falling and hit back of head. Not on any blood thinners at home. EMS called. Found patient HR 160s and was given 6 mg adenosine then given 20 mg cardizem converting back to sinus rhythm.   Upon arrival to [facility] ED, patient hemodynamically stable and afebrile. EKG sinus rhythm. CT head showing hemorrhage in the anterior right frontal lobe, likely contusion, with surrounding edema; Additional SDH and SAH along the right frontal convexity, with 4 mm right-to-left midline shift. NSG consulted recommending SBP goal <150 and Keppra 500 bid. Recommend ICU admission for close neuro monitoring. PCCM consulted.  Pertinent ED labs: ethanol and UDS wnl, trop 9, K 3.1, mag 1.5, glucose 177 CTA chest with no PE; large hiatal hernia; b/l adrenal nodules possible adenomas.  Pertinent  Medical History   Past Medical History:  Diagnosis Date   Allergic rhinitis, cause unspecified    seasonal   Anginal pain (HCC) 2013   Stress   Arthritis    hips, thumb,knees   Diabetes mellitus without complication (HCC)    Essential hypertension, benign 12/12/2011   Joint pain    Multiple drug allergies    Obesity    Wears glasses    Wears partial dentures    upper     Significant Hospital Events: Including procedures, antibiotic start and stop dates in addition to other pertinent events   6/11 syncopal episode then hit head; ct head with head bleed and midline shift 6/15 episodes of SVT, lethargy 6/17 Worsening mental status and right blown pupil this am. Intubated 619 blood culture 1 of 2 Staph hominis, Staph  epidermidis  Interim History / Subjective:   She coughed out OG. Family requesting another one not be placed. Had episodes of SVT this am requiring cardioversion, adenosine, amio bolus. Was briefly on vasopressors, now off.   Objective   Blood pressure (!) 121/51, pulse 64, temperature 99.5 F (37.5 C), temperature source Axillary, resp. rate 18, height 5' 7.5" (1.715 m), weight 90.8 kg, last menstrual period 03/26/2012, SpO2 99 %.    Vent Mode: PRVC FiO2 (%):  [40 %] 40 % Set Rate:  [15 bmp] 15 bmp Vt Set:  [410 mL] 410 mL PEEP:  [5 cmH20] 5 cmH20 Pressure Support:  [5 cmH20] 5 cmH20 Plateau Pressure:  [8 cmH20-14 cmH20] 11 cmH20   Intake/Output Summary (Last 24 hours) at 06/02/2023 1202 Last data filed at 06/02/2023 1100 Gross per 24 hour  Intake 1508.56 ml  Output 775 ml  Net 733.56 ml   Filed Weights   05/29/23 0500 05/30/23 0500 05/31/23 0500  Weight: 96.5 kg 93.8 kg 90.8 kg   Physical Exam: Gen:      Intubated, sedated, acutely ill appearing HEENT:  ETT to vent, large tongue Lungs:    sounds of mechanical ventilation auscultated n owheeze CV:         RRR. Occasional PVCs Abd:      + bowel sounds; soft, non-tender; no palpable masses, no distension Ext:    No edema Skin:      Warm and dry; no rashes Neuro:  off  sedation, not responsive  Na 159 K 3.3 Cr 0.72 Hgb 11.2  Resolved Hospital Problem list   Lactic acidosis  Assessment & Plan:   Large right frontal lobe and mild right anterior temporal lobe hemorrhagic contusions with edema with mass effect/brain compression Left inferior frontal hemorrhagic contusion with edema Small subdural hemorrhage Posterior scalp lac Acute cortical ischemia throughout right PCA and small areas of acute ischemia of the right thalamus and bilateral midbrain 6/16 Hypertonic weaned and discontinued at 0500 6/17 Less responsive>MRI with acute cortical ischemia throughout right PCA and small areas of acute ischemia of the right  thalamus and bilateral midbrain. Bilateral R>L hematomas with 7 mm leftward midline shift, previously 5mm -3% saline held for sodium 161 > 159.  Free water added overnight.  Continue to follow frequent sodium -Appreciate neurology, neurosurgery evaluation and management -Currently off Keppra -Continue neuroprotective measures  Intubated for airway protection -PRVC 8 cc/kg.  Continue PSV ad lib.  She is not a candidate for extubation due to impaired airway protection and encephalopathy.  Question whether she may require tracheostomy depending on long-term goals.  Will need to continue discussed with family. Sounds like they are leaning towards transition to comfort measures -VAP prevention order set in place  Syncopal episode -Dr. Chestine Spore did previously discuss syncope precautions with her and her son & daughter- no driving x 6 months and avoid high risk activities without supervision-- ladders, swimming, etc.  -Appreciate cardiology input, plans for post stabilization monitoring  Staph epidermidis blood culture 1 of 2 -Suspect that this is contaminant, following clinically off antibiotics -Recheck cultures if fever, any evidence infection or clinical decline.  No leukocytosis noted  SVT -back on amiodarone -Continue to follow telemetry for any evidence of recurrence  Hypertension Coronary vasospasm -Hydralazine as ordered -Labetalol, hydralazine available if needed - continue amiodarone   Hypokalemia -Following BMP - replacing aggressively enterally due to SVT this am  History of peripheral neuropathy -Gabapentin is on hold  History of GERD -PPI as ordered  Adrenal nodules, outpatient follow-up  Best Practice (right click and "Reselect all SmartList Selections" daily)   Diet/type: dysphagia diet (see orders) DVT prophylaxis: SCD GI prophylaxis: PPI Lines: N/A Foley:  N/A Code Status:  full code Last date of multidisciplinary goals of care discussion - spoke with son 6/22  am. They are confirming DNR and are waiting for additional family members to arrive before transitioning to comfort measures.   The patient is critically ill due to respiratory failure, encephalopathy.  Critical care was necessary to treat or prevent imminent or life-threatening deterioration.  Critical care was time spent personally by me on the following activities: development of treatment plan with patient and/or surrogate as well as nursing, discussions with consultants, evaluation of patient's response to treatment, examination of patient, obtaining history from patient or surrogate, ordering and performing treatments and interventions, ordering and review of laboratory studies, ordering and review of radiographic studies, pulse oximetry, re-evaluation of patient's condition and participation in multidisciplinary rounds.   Critical Care Time devoted to patient care services described in this note is 34 minutes. This time reflects time of care of this signee Charlott Holler . This critical care time does not reflect separately billable procedures or procedure time, teaching time or supervisory time of PA/NP/Med student/Med Resident etc but could involve care discussion time.       Mickel Baas [facility] Medicine 06/02/2023 12:04 PM  Pager: see AMION  If no response to pager ,  please call critical care on call (see AMION) until 7pm After 7:00 pm call Elink

## 2023-06-02 NOTE — Progress Notes (Signed)
[facility] ADULT ICU REPLACEMENT PROTOCOL   The patient does apply for the [facility] Adult ICU Electrolyte Replacment Protocol based on the criteria listed below:   1.Exclusion criteria: TCTS, ECMO, Dialysis, and Myasthenia Gravis patients 2. Is GFR >/= 30 ml/min? Yes.    Patient's GFR today is > 60 3. Is SCr </= 2? Yes.   Patient's SCr is 0.72 mg/dL 4. Did SCr increase >/= 0.5 in 24 hours? No. 5.Pt's weight >40kg  Yes.   6. Abnormal electrolyte(s): K+ 3.3  7. Electrolytes replaced per protocol 8.  Call MD STAT for K+ </= 2.5, Phos </= 1, or Mag </= 1 Physician:  Dr Coral Spikes, Jettie Booze 06/02/2023 5:04 AM

## 2023-06-02 NOTE — Progress Notes (Signed)
eLink Physician-Brief Progress Note Patient Name: Lindsay [name] DOB: [birth year] MRN: [id number]   Date of Service  06/02/2023  HPI/Events of Note  Patient went into SVT with a rate > 200, BP was initially acceptable so she received Adenosine 6 mg IV which failed to terminate the rhythm, this was followed by Adenosine 12 mg  iv which also failed to terminate the rhythm, her blood pressure became unstable so she was cardioverted with 200 joules x 2, Amiodarone 150 mg iv bolus was ordered followed by Amiodarone gtt, Phenylephrine gtt was started for hypotension, and when BP remained low on 300 mcg of  Phenylephrine, vasopressin gtt was added, patient was also given a 500 ml iv NS bolus based on a net fluid balance of - 3.6 liters reported by the bedside RN, blood pressure has now recovered, plan is to wean Phenylephrine gtt as tolerated and continue Amiodarone gtt.  eICU Interventions  See above.        Lindsay [name] 06/02/2023, 3:50 AM

## 2023-06-02 NOTE — Progress Notes (Addendum)
STROKE TEAM PROGRESS NOTE   INTERVAL HISTORY Son at bedside, updated.  Overnight patient had episode of SVT in the 200s which required 2 doses of adenosine followed by 2 cardioversions she was then given an amiodarone bolus and placed on amnio drip.  Last night she was requiring neo and vaso drips but has been weaned off those as of rounds this morning.  On monitor patient continues to be in arrhythmia with multiple PVCs seen.  Neurological exam remains unchanged.  She was on hypertonic saline since 6/17-6/20 with no appreciable improvement in her neurological status. Na 159 this AM, hypertonic saline on hold since 6/21 AM.    Vitals:   06/02/23 1045 06/02/23 1100 06/02/23 1115 06/02/23 1130  BP: 115/71 124/63 (!) 127/58 (!) 121/51  Pulse: 69 64 62 64  Resp: 17 19 20 18   Temp:      TempSrc:      SpO2: 99% 99% 100% 99%  Weight:      Height:       CBC:  Recent Labs  Lab 05/29/23 0356 05/30/23 0501 06/01/23 0615 06/02/23 0315  WBC 6.0   < > 8.7 7.7  NEUTROABS 4.0  --   --   --   HGB 10.2*   < > 11.6* 11.2*  HCT 32.9*   < > 39.4 38.2  MCV 77.8*   < > 80.2 80.8  PLT 192   < > 217 219   < > = values in this interval not displayed.    Basic Metabolic Panel:  Recent Labs  Lab 05/31/23 1653 05/31/23 2012 06/01/23 1748 06/02/23 0315  NA  --    < > 158* 159*  K  --    < > 3.6 3.3*  CL  --    < > 123* 123*  CO2  --    < > 26 27  GLUCOSE  --    < > 113* 122*  BUN  --    < > 14 14  CREATININE  --    < > 0.72 0.72  CALCIUM  --    < > 8.5* 8.4*  MG 2.1  --   --  2.1  PHOS 3.2  --   --  3.6   < > = values in this interval not displayed.   No results for input(s): "ETH" in the last 168 hours.   IMAGING past 24 hours No results found.  PHYSICAL EXAM  Constitutional: Well-developed, well-nourished middle-aged woman who is intubated and sedated, and in no distress.  Cardiovascular: Irregular Pulmonary: Intubated Skin: Skin is warm and dry.   Neurological:  Stable  Patient is intubated and sedated.  Patient does not open eyes to painful stimulus. Pupils remain unequal. Right pupil 5 mm remains fixed nonreactive.  Left pupil 3 mm sluggishly reactive.  Corneal reflexes, cough and gag present. No movement in the left upper extremity and trace withdrawal in the left lower extremity.  ASSESSMENT/[name] is a 62 y.o. female with history of T2DM presenting after syncopal episode found to have R frontal lobe hemorrhage with 4mm shift. Patient noted to have acute mental status change 6/17 AM. Repeat CT head with no significant change. 3% saline tapered 6/16 AM. Patient found to be unresponsive with unequal pupils and MRI brain demonstrated acute R PCA infarct. Her neuro status has not improved despite hypertonic saline since 6/17. In addition, CT head shows no pathology amenable to intervention to help with her symtpoms  ICH - b/l frontal traumatic contusion, R>L, with occipital skull fracture Rebleeding - right frontal ICH enlarged, etiology unclear  Stroke - Acute R PCA infarct, etiology unclear, concerning from arrhythmia ? Afib CT head:  Right frontal lobe hemorrhage with surrounding edema.  4 mm right to left midline shift.  Additional subdural and subarachnoid hemorrhages along the right frontal convexity. CTA head & neck: No LVO or significant stenosis in head or neck. Repeat CT head: Scattered posttraumatic subarachnoid hemorrhage with superimposed hemorrhagic contusions in the anterior right frontal convexity, slightly increased.  Mild mild mass effect with no more than trace right to left shift, stable. Evolving left posterior scalp contusion with laceration. Underlying acute nondisplaced nondepressed occipital skull fracture Repeat CT 6/12 PM: Increase size of hemorrhagic contusion involving anterior right frontal lobe, now measuring 5 x 5 x 3 0.2 x 3.3 cm. Worsening surrounding vasogenic edema and regional mass effect, 5 mm right to left  shift now seen. Repeat CT head 6/13 a.m: Stable hemorrhagic contusion with edema.  Stable mass effect with leftward midline shift of 5 to 6 mm.  Mild SAH. CT head 6/15 AM. Stable since 6/13.  CT head 6/17 am. No interval change. Unchanged hemorrhagic contusion in bilateral frontal lobes. Non hemorrhagic contusion at anterior R temporal lobe. Stable nondisplaced L skull base Fx MRI brain 6/17 R large PCA infarct including R thalamus, as well as b/l midbrain 2D Echo, EF 60 to 65%, unchanged from prior  LDL 106 HgbA1c 5.7 VTE prophylaxis - SQ Heparin  No antithrombotic prior to admission, now on ASA 81 GOC: discussed with son and he understood the gravity, recommend to involve palliative care Disposition:  pending   Cerebral edema CT head showed MLS  On 3% saline->FW 100 Q4h Na 161->158->159 Na goal 150-155 Continue monitoring  ?Syncope SVT Frequent PACs on monitor SVT >200 overnight requiring Adenosine x2, Cardiovert x2, now on Amiodarone gtt 2D echo EF 60 to 65%. No AS or AR.  Question for occult afib  Hypertension Home meds:  None Unstable, required pressors overnight, now weaned off but soft. Hydralazine held this AM  SBP goal <160 Long-term BP goal normotensive  Hyperlipidemia Home meds:  None LDL 106, goal < 70 Crestor 20mg  via tube Continue statin on discharge  Diabetes type II Controlled Home meds:  Mounjaro  HgbA1c 5.7, goal < 7.0 CBGs SSI  Other Stroke Risk Factors Former Cigarette smoker Obesity, Body mass index is 31.43 kg/m., BMI >/= 30 associated with increased stroke risk, recommend weight loss, diet and exercise as appropriate    Other Active Problems Hx of Chronic Pain Gabapentin, Skelaxin, Xanaflex home meds  Hospital day # 11   Pt seen by Neuro NP/APP and later by MD. Note/[name] to be edited by MD as needed.    Lindsay January, DNP, AGACNP-BC [facility] Please use AMION for contact information & EPIC for messaging.  ATTENDING  NOTE: I reviewed above note and agree with the assessment and [name]. Pt was seen and examined.   Pt son and daughter are at the bedside. Pt still intubated not on sedation, eyes closed, not following commands. With forced eye opening, eyes in need position, not blinking to visual threat, doll's eyes present, not tracking, left pupil 2 mm, right pupil is 3 mm, sluggish to light. Corneal reflex absent on the left, positive on the right, gag and cough present. Breathing over the vent.  Facial symmetry not able to test due to ET tube.  Tongue protrusion not  cooperative. On pain stimulation, mildly withdrawal in all extremities. Sensation, coordination and gait not tested.  Pt Na 159, continue holding hypertonic saline. On FW and TF. With b/l frontal contusion and large PCA infarct with b/l midbrain involvement, prognosis likely to be poor. Discussed with son and daughter at the bedside. They understood the gravity. Will recommend palliative care involvement. Currently on amiodarine drip for frequent SVT s/p cardioversion and adenosine. On ASA and statin, BP control.  For detailed assessment and [name], please refer to above/below as I have made changes wherever appropriate.   Lindsay Plan, MD PhD Stroke Neurology 06/02/2023 8:09 PM  This patient is critically ill due to ICH, ICH extension, skull fracture, SVT, cerebral edema and large infarct and at significant risk of neurological worsening, death form brain herniation, heart failure, hematoma expansion, recurrent stroke. This patient's care requires constant monitoring of vital signs, hemodynamics, respiratory and cardiac monitoring, review of multiple databases, neurological assessment, discussion with family, other specialists and medical decision making of high complexity. I spent 35 minutes of neurocritical care time in the care of this patient. I had long discussion with son and daughter at bedside, updated pt current condition, treatment [name] and  potential poor prognosis, and answered all the questions. They expressed understanding and appreciation.    To contact Stroke Continuity provider, please refer to [email or web address]. After hours, contact General Neurology

## 2023-06-02 NOTE — Progress Notes (Signed)
SLP Cancellation Note  Patient Details Name: Lindsay [name] MRN: [id number] DOB: 31-May-1961   Cancelled treatment:       Reason Eval/Treat Not Completed: Medical issues which prohibited therapy Not medically appropriate for SLP, will sign off   [place], Riley Nearing 06/02/2023, 7:55 AM

## 2023-06-03 ENCOUNTER — Inpatient Hospital Stay (HOSPITAL_COMMUNITY): Payer: 59

## 2023-06-03 DIAGNOSIS — I629 Nontraumatic intracranial hemorrhage, unspecified: Secondary | ICD-10-CM | POA: Diagnosis not present

## 2023-06-03 LAB — CBC
HCT: 35.3 % — ABNORMAL LOW (ref 36.0–46.0)
Hemoglobin: 10.3 g/dL — ABNORMAL LOW (ref 12.0–15.0)
MCH: 23.7 pg — ABNORMAL LOW (ref 26.0–34.0)
MCHC: 29.2 g/dL — ABNORMAL LOW (ref 30.0–36.0)
MCV: 81.3 fL (ref 80.0–100.0)
Platelets: 218 10*3/uL (ref 150–400)
RBC: 4.34 MIL/uL (ref 3.87–5.11)
RDW: 21.9 % — ABNORMAL HIGH (ref 11.5–15.5)
WBC: 6.9 10*3/uL (ref 4.0–10.5)
nRBC: 0 % (ref 0.0–0.2)

## 2023-06-03 LAB — BASIC METABOLIC PANEL
Anion gap: 10 (ref 5–15)
BUN: 13 mg/dL (ref 8–23)
CO2: 26 mmol/L (ref 22–32)
Calcium: 8.4 mg/dL — ABNORMAL LOW (ref 8.9–10.3)
Chloride: 122 mmol/L — ABNORMAL HIGH (ref 98–111)
Creatinine, Ser: 0.71 mg/dL (ref 0.44–1.00)
GFR, Estimated: >60 mL/min (ref >60)
Glucose, Bld: 111 mg/dL — ABNORMAL HIGH (ref 70–99)
Potassium: 3.4 mmol/L — ABNORMAL LOW (ref 3.5–5.1)
Sodium: 158 mmol/L — ABNORMAL HIGH (ref 135–145)

## 2023-06-03 LAB — GLUCOSE, CAPILLARY: Glucose-Capillary: 91 mg/dL (ref 70–99)

## 2023-06-03 LAB — CULTURE, RESPIRATORY W GRAM STAIN

## 2023-06-03 MED ORDER — FREE WATER
100.0000 mL | Status: DC
  Administered 2023-06-03 (×2): 100 mL

## 2023-06-03 MED ORDER — FREE WATER
100.0000 mL | Status: DC
  Administered 2023-06-03 – 2023-06-07 (×49): 100 mL

## 2023-06-03 MED ORDER — POTASSIUM CHLORIDE CRYS ER 20 MEQ PO TBCR
20.0000 meq | EXTENDED_RELEASE_TABLET | Freq: Two times a day (BID) | ORAL | Status: DC
  Administered 2023-06-03: 20 meq via ORAL
  Filled 2023-06-03 (×2): qty 1

## 2023-06-03 MED ORDER — ORAL CARE MOUTH RINSE: 15.0000 mL | OROMUCOSAL | Status: DC | PRN

## 2023-06-03 MED ORDER — ORAL CARE MOUTH RINSE
15.0000 mL | OROMUCOSAL | Status: DC
  Administered 2023-06-03 – 2023-06-07 (×52): 15 mL via OROMUCOSAL

## 2023-06-03 MED ORDER — POTASSIUM CHLORIDE 20 MEQ PO PACK
40.0000 meq | PACK | Freq: Once | ORAL | Status: AC
  Administered 2023-06-03: 40 meq
  Filled 2023-06-03: qty 2

## 2023-06-03 MED ORDER — POTASSIUM CHLORIDE 10 MEQ/50ML IV SOLN
10.0000 meq | INTRAVENOUS | Status: DC
  Administered 2023-06-03 (×2): 10 meq via INTRAVENOUS
  Filled 2023-06-03 (×6): qty 50

## 2023-06-03 NOTE — Progress Notes (Signed)
NAME:  Lindsay [name], MRN:  [id number], DOB:  08-24-1961, LOS: 12 ADMISSION DATE:  05/16/2023, CONSULTATION DATE:  6/11 REFERRING MD:  Dr. Elpidio Anis, CHIEF COMPLAINT:  Head bleed   History of Present Illness:  Patient is a 62 yo Female w/ pertinent PMH coronary artery vasospasm, DMT2, HTN presents to [facility] ED on 6/11 w/ head bleed.  On 6/11, patient was at work and had syncopal episode falling and hit back of head. Not on any blood thinners at home. EMS called. Found patient HR 160s and was given 6 mg adenosine then given 20 mg cardizem converting back to sinus rhythm.   Upon arrival to [facility] ED, patient hemodynamically stable and afebrile. EKG sinus rhythm. CT head showing hemorrhage in the anterior right frontal lobe, likely contusion, with surrounding edema; Additional SDH and SAH along the right frontal convexity, with 4 mm right-to-left midline shift. NSG consulted recommending SBP goal <150 and Keppra 500 bid. Recommend ICU admission for close neuro monitoring. PCCM consulted.  Pertinent ED labs: ethanol and UDS wnl, trop 9, K 3.1, mag 1.5, glucose 177 CTA chest with no PE; large hiatal hernia; b/l adrenal nodules possible adenomas.  Pertinent  Medical History   Past Medical History:  Diagnosis Date   Allergic rhinitis, cause unspecified    seasonal   Anginal pain (HCC) 2013   Stress   Arthritis    hips, thumb,knees   Diabetes mellitus without complication (HCC)    Essential hypertension, benign 12/12/2011   Joint pain    Multiple drug allergies    Obesity    Wears glasses    Wears partial dentures    upper     Significant Hospital Events: Including procedures, antibiotic start and stop dates in addition to other pertinent events   6/11 syncopal episode then hit head; ct head with head bleed and midline shift 6/15 episodes of SVT, lethargy 6/17 Worsening mental status and right blown pupil this am. Intubated 619 blood culture 1 of 2 Staph hominis, Staph  epidermidis  Interim History / Subjective:   Patient's family agreeable to placement of OG tube now that they understand it's not likely to cause her much pain.   Objective   Blood pressure (!) 101/49, pulse (!) 55, temperature 98.3 F (36.8 C), temperature source Oral, resp. rate 15, height 5' 7.5" (1.715 m), weight 90.5 kg, last menstrual period 03/26/2012, SpO2 99 %.    Vent Mode: PRVC FiO2 (%):  [40 %-50 %] 40 % Set Rate:  [15 bmp] 15 bmp Vt Set:  [410 mL] 410 mL PEEP:  [5 cmH20] 5 cmH20 Plateau Pressure:  [8 cmH20-12 cmH20] 8 cmH20   Intake/Output Summary (Last 24 hours) at 06/03/2023 1137 Last data filed at 06/03/2023 1133 Gross per 24 hour  Intake 1054.83 ml  Output 1650 ml  Net -595.17 ml   Filed Weights   05/30/23 0500 05/31/23 0500 06/03/23 0500  Weight: 93.8 kg 90.8 kg 90.5 kg   Physical Exam: Gen:      Intubated, sedated, acutely ill appearing HEENT:  ETT to vent, enlarged tongue Lungs:    sounds of mechanical ventilation auscultated no wheeze CV:         RRR Abd:      Obese, soft Ext:    No edema Skin:      Warm and dry; no rashes Neuro:   not responsive   Na 158 K 3.4 Cl 122 WBC 6.9 Hgb 10.3  Resolved Hospital Problem list   Lactic acidosis  Assessment & Plan:   Large right frontal lobe and mild right anterior temporal lobe hemorrhagic contusions with edema with mass effect/brain compression Left inferior frontal hemorrhagic contusion with edema Small subdural hemorrhage Posterior scalp lac Acute cortical ischemia throughout right PCA and small areas of acute ischemia of the right thalamus and bilateral midbrain 6/16 Hypertonic weaned and discontinued at 0500 6/17 Less responsive>MRI with acute cortical ischemia throughout right PCA and small areas of acute ischemia of the right thalamus and bilateral midbrain. Bilateral R>L hematomas with 7 mm leftward midline shift, previously 5mm -3% saline held for sodium 161 > 159.  Free water added, monitor  daily sodium -Appreciate neurology, neurosurgery evaluation and management -Currently off Keppra -Continue neuroprotective measures  Intubated for airway protection -PRVC 8 cc/kg.  Continue PSV ad lib.  She is not a candidate for extubation due to impaired airway protection and encephalopathy -VAP prevention order set in place  Syncopal episode -Dr. Chestine Spore did previously discuss syncope precautions with her and her son & daughter- no driving x 6 months and avoid high risk activities without supervision-- ladders, swimming, etc.  -Appreciate cardiology input, plans for post stabilization monitoring  Staph epidermidis blood culture 1 of 2 -Suspect that this is contaminant, following clinically off antibiotics -Recheck cultures if fever, any evidence infection or clinical decline.  No leukocytosis noted  SVT -back on amiodarone -Continue to follow telemetry for any evidence of recurrence  Hypertension Coronary vasospasm -Hydralazine as ordered -Labetalol, hydralazine available if needed - continue amiodarone   Hypokalemia -Following BMP - will place OG and replace enterally  History of peripheral neuropathy -Gabapentin is on hold  History of GERD -PPI as ordered   Best Practice (right click and "Reselect all SmartList Selections" daily)   Diet/type: dysphagia diet (see orders) DVT prophylaxis: SCD GI prophylaxis: PPI Lines: N/A Foley:  N/A Code Status:  full code Last date of multidisciplinary goals of care discussion - spoke with son 6/22.  They understand severity of patients condition given and will likely transition to comfort measures Wednesday. Awaiting additional family as well.   The patient is critically ill due to encephalopathy, respiratory failure.  Critical care was necessary to treat or prevent imminent or life-threatening deterioration.  Critical care was time spent personally by me on the following activities: development of treatment plan with patient  and/or surrogate as well as nursing, discussions with consultants, evaluation of patient's response to treatment, examination of patient, obtaining history from patient or surrogate, ordering and performing treatments and interventions, ordering and review of laboratory studies, ordering and review of radiographic studies, pulse oximetry, re-evaluation of patient's condition and participation in multidisciplinary rounds.   Critical Care Time devoted to patient care services described in this note is 32 minutes. This time reflects time of care of this signee Charlott Holler . This critical care time does not reflect separately billable procedures or procedure time, teaching time or supervisory time of PA/NP/Med student/Med Resident etc but could involve care discussion time.       Charlott Holler [facility] 06/03/2023 11:40 AM  Pager: see AMION  If no response to pager , please call critical care on call (see AMION) until 7pm After 7:00 pm call Elink

## 2023-06-03 NOTE — Progress Notes (Addendum)
eLink Physician-Brief Progress Note Patient Name: AZENETH CARBONELL DOB: 28-Mar-1961 MRN: [id number]   Date of Service  06/03/2023  HPI/Events of Note  Reviewed Na which remains at 159 (labs from 6/22).  Pt started on free water flushes but per RN, there is no feeding tube in place and family has opted not to reinsert.    Free water deficit to improve Na down to 155 is 1.1L.   eICU Interventions  Increase free water to q2hrs once with enteral access. Check CBC, BMP this morning.      Intervention Category Intermediate Interventions: Electrolyte abnormality - evaluation and management  Larinda Buttery 06/03/2023, 6:49 AM

## 2023-06-03 NOTE — Progress Notes (Addendum)
STROKE TEAM PROGRESS NOTE   INTERVAL HISTORY  Son and daughter-in-law at bedside.  Plan of care discussed. Patient now requiring Neo-Synephrine for blood pressure.  Continues on amiodarone drip.  She was on hypertonic saline since 6/17-6/20 with no appreciable improvement in her neurological status. Na 158 this AM, hypertonic saline on hold since 6/21 AM.   No feeding tube in place.  Need for medications and nutrition discussed with family.  They consented to feeding tube being reinserted.    Vitals:   06/03/23 0545 06/03/23 0600 06/03/23 0715 06/03/23 0730  BP:  131/69 (!) 142/61 (!) 110/55  Pulse:  79 89 (!) 53  Resp: 16 16 (!) 27 17  Temp:      TempSrc:      SpO2: 100% 99% 99% 99%  Weight:      Height:       CBC:  Recent Labs  Lab 05/29/23 0356 05/30/23 0501 06/02/23 0315 06/03/23 0708  WBC 6.0   < > 7.7 6.9  NEUTROABS 4.0  --   --   --   HGB 10.2*   < > 11.2* 10.3*  HCT 32.9*   < > 38.2 35.3*  MCV 77.8*   < > 80.8 81.3  PLT 192   < > 219 218   < > = values in this interval not displayed.    Basic Metabolic Panel:  Recent Labs  Lab 05/31/23 1653 05/31/23 2012 06/01/23 1748 06/02/23 0315  NA  --    < > 158* 159*  K  --    < > 3.6 3.3*  CL  --    < > 123* 123*  CO2  --    < > 26 27  GLUCOSE  --    < > 113* 122*  BUN  --    < > 14 14  CREATININE  --    < > 0.72 0.72  CALCIUM  --    < > 8.5* 8.4*  MG 2.1  --   --  2.1  PHOS 3.2  --   --  3.6   < > = values in this interval not displayed.   No results for input(s): "ETH" in the last 168 hours.   IMAGING past 24 hours No results found.  PHYSICAL EXAM  Constitutional: Well-developed, well-nourished middle-aged woman who is intubated and sedated, and in no distress.  Cardiovascular: Irregular Pulmonary: Intubated Skin: Skin is warm and dry.   Neurological: Stable   Pt still intubated not on sedation, eyes closed, not following commands. With forced eye opening, eyes in mid position, not blinking to  visual threat, doll's eyes present, not tracking, left pupil 2 mm, right pupil is 3 mm, sluggish to light. Corneal reflex absent on the left, positive on the right, gag and cough present. Breathing over the vent.  Facial symmetry not able to test due to ET tube.  Tongue protrusion not cooperative. On pain stimulation, mildly withdrawal in all extremities, right more then left. Sensation, coordination and gait not tested   ASSESSMENT/PLAN Lindsay [name] is a 63 y.o. female with history of T2DM presenting after syncopal episode found to have R frontal lobe hemorrhage with 4mm shift. Patient noted to have acute mental status change 6/17 AM. Repeat CT head with no significant change. 3% saline tapered 6/16 AM. Patient found to be unresponsive with unequal pupils and MRI brain demonstrated acute R PCA infarct. Her neuro status has not improved despite hypertonic saline since 6/17. In  addition, CT head shows no pathology amenable to intervention to help with her symtpoms   ICH - b/l frontal traumatic contusion, R>L, with occipital skull fracture Rebleeding - right frontal ICH enlarged, etiology unclear  Stroke - Acute R PCA infarct, etiology unclear, concerning from arrhythmia ? Afib CT head:  Right frontal lobe hemorrhage with surrounding edema.  4 mm right to left midline shift.  Additional subdural and subarachnoid hemorrhages along the right frontal convexity. CTA head & neck: No LVO or significant stenosis in head or neck. Repeat CT head: Scattered posttraumatic subarachnoid hemorrhage with superimposed hemorrhagic contusions in the anterior right frontal convexity, slightly increased.  Mild mild mass effect with no more than trace right to left shift, stable. Evolving left posterior scalp contusion with laceration. Underlying acute nondisplaced nondepressed occipital skull fracture Repeat CT 6/12 PM: Increase size of hemorrhagic contusion involving anterior right frontal lobe, now measuring 5 x 5 x 3  0.2 x 3.3 cm. Worsening surrounding vasogenic edema and regional mass effect, 5 mm right to left shift now seen. Repeat CT head 6/13 a.m: Stable hemorrhagic contusion with edema.  Stable mass effect with leftward midline shift of 5 to 6 mm.  Mild SAH. CT head 6/15 AM. Stable since 6/13.  CT head 6/17 am. No interval change. Unchanged hemorrhagic contusion in bilateral frontal lobes. Non hemorrhagic contusion at anterior R temporal lobe. Stable nondisplaced L skull base Fx MRI brain 6/17 R large PCA infarct including R thalamus, as well as b/l midbrain 2D Echo, EF 60 to 65%, unchanged from prior  LDL 106 HgbA1c 5.7 VTE prophylaxis - SQ Heparin  No antithrombotic prior to admission, now on ASA 81 GOC: discussed with son and he understood the gravity, recommend to involve palliative care Disposition:  pending   Cerebral edema CT head showed MLS  Previously on 3% saline->FW 100 Q4h Na 161->158->159->158 Na goal 150-155 Continue monitoring Restart Free Water Flushes with OG  ?Syncope SVT Frequent PACs on monitor SVT >200 overnight requiring Adenosine x2, Cardiovert x2, now on Amiodarone gtt 2D echo EF 60 to 65%. No AS or AR.  Question for occult afib  Hypertension Home meds:  None Unstable, requiring Neo Hydralazine discontinued  SBP goal <160 Long-term BP goal normotensive  Hyperlipidemia Home meds:  None LDL 106, goal < 70 Crestor 20mg  via tube Continue statin on discharge  Diabetes type II Controlled Home meds:  Mounjaro  HgbA1c 5.7, goal < 7.0 CBGs  SSI  Dysphagia Reinsert OGT Restart Tube feeds  Other Stroke Risk Factors Former Cigarette smoker Obesity, Body mass index is 31.43 kg/m., BMI >/= 30 associated with increased stroke risk, recommend weight loss, diet and exercise as appropriate    Other Active Problems Hypokalemia Replenish via IV and Per Tube, recheck in AM Hx of Chronic Pain Gabapentin, Skelaxin, Xanaflex home meds--on hold  Hospital day #  12   Pt seen by Neuro NP/APP and later by MD. Note/plan to be edited by MD as needed.    Lindsay January, DNP, AGACNP-BC [facility] Please use AMION for contact information & EPIC for messaging.  ATTENDING NOTE: I reviewed above note and agree with the assessment and plan. Pt was seen and examined.   Pt son and daughter in law are at the bedside. Pt still intubated not on sedation, eyes closed, not following commands. With forced eye opening, eyes in mid position, not blinking to visual threat, doll's eyes present, not tracking, left pupil 2 mm, right pupil is 3 mm,  sluggish to light. Corneal reflex absent on the left, positive on the right, gag and cough present. Breathing over the vent.  Facial symmetry not able to test due to ET tube.  Tongue protrusion not cooperative. On pain stimulation, mildly withdrawal in all extremities, right more then left. Sensation, coordination and gait not tested.   Pt has not made much progress for the last several days, prognosis seems to be poor. Discussed with son and daughter in law, recommended palliative care. Son initially plan to give pt maximal days on vent to see if there is any progression, but now seems [facility] with about palliative care discussion. May consider palliative care consult.   For detailed assessment and plan, please refer to above/below as I have made changes wherever appropriate.   No new recs from neurology. We will follow peripherally. Please call us back with any questions.   Marvel Plan, MD PhD Stroke Neurology 06/03/2023 7:03 PM      To contact Stroke Continuity provider, please refer to [email or web address]. After hours, contact General Neurology

## 2023-06-04 ENCOUNTER — Inpatient Hospital Stay (HOSPITAL_COMMUNITY): Payer: 59

## 2023-06-04 DIAGNOSIS — I629 Nontraumatic intracranial hemorrhage, unspecified: Secondary | ICD-10-CM | POA: Diagnosis not present

## 2023-06-04 LAB — URINALYSIS, W/ REFLEX TO CULTURE (INFECTION SUSPECTED)
Bacteria, UA: NONE SEEN
Bilirubin Urine: NEGATIVE
Glucose, UA: NEGATIVE mg/dL
Hgb urine dipstick: NEGATIVE
Ketones, ur: NEGATIVE mg/dL
Leukocytes,Ua: NEGATIVE
Nitrite: NEGATIVE
Protein, ur: NEGATIVE mg/dL
Specific Gravity, Urine: 1.027 (ref 1.005–1.030)
pH: 5 (ref 5.0–8.0)

## 2023-06-04 LAB — COMPREHENSIVE METABOLIC PANEL
ALT: 88 U/L — ABNORMAL HIGH (ref 0–44)
ALT: 82 U/L — ABNORMAL HIGH (ref 0–44)
AST: 53 U/L — ABNORMAL HIGH (ref 15–41)
AST: 47 U/L — ABNORMAL HIGH (ref 15–41)
Albumin: 2.5 g/dL — ABNORMAL LOW (ref 3.5–5.0)
Albumin: 2.3 g/dL — ABNORMAL LOW (ref 3.5–5.0)
Alkaline Phosphatase: 59 U/L (ref 38–126)
Alkaline Phosphatase: 58 U/L (ref 38–126)
Anion gap: 7 (ref 5–15)
Anion gap: 13 (ref 5–15)
BUN: 16 mg/dL (ref 8–23)
BUN: 15 mg/dL (ref 8–23)
CO2: 24 mmol/L (ref 22–32)
CO2: 26 mmol/L (ref 22–32)
Calcium: 8.4 mg/dL — ABNORMAL LOW (ref 8.9–10.3)
Calcium: 8.6 mg/dL — ABNORMAL LOW (ref 8.9–10.3)
Chloride: 119 mmol/L — ABNORMAL HIGH (ref 98–111)
Chloride: 115 mmol/L — ABNORMAL HIGH (ref 98–111)
Creatinine, Ser: 0.6 mg/dL (ref 0.44–1.00)
Creatinine, Ser: 0.59 mg/dL (ref 0.44–1.00)
GFR, Estimated: >60 mL/min (ref >60)
GFR, Estimated: >60 mL/min (ref >60)
Glucose, Bld: 223 mg/dL — ABNORMAL HIGH (ref 70–99)
Glucose, Bld: 208 mg/dL — ABNORMAL HIGH (ref 70–99)
Potassium: 4 mmol/L (ref 3.5–5.1)
Potassium: 3.8 mmol/L (ref 3.5–5.1)
Sodium: 150 mmol/L — ABNORMAL HIGH (ref 135–145)
Sodium: 154 mmol/L — ABNORMAL HIGH (ref 135–145)
Total Bilirubin: 0.4 mg/dL (ref 0.3–1.2)
Total Bilirubin: 0.6 mg/dL (ref 0.3–1.2)
Total Protein: 5.7 g/dL — ABNORMAL LOW (ref 6.5–8.1)
Total Protein: 5.4 g/dL — ABNORMAL LOW (ref 6.5–8.1)

## 2023-06-04 LAB — BASIC METABOLIC PANEL
Anion gap: 6 (ref 5–15)
BUN: 14 mg/dL (ref 8–23)
CO2: 25 mmol/L (ref 22–32)
Calcium: 7.8 mg/dL — ABNORMAL LOW (ref 8.9–10.3)
Chloride: 120 mmol/L — ABNORMAL HIGH (ref 98–111)
Creatinine, Ser: 0.58 mg/dL (ref 0.44–1.00)
GFR, Estimated: >60 mL/min (ref >60)
Glucose, Bld: 225 mg/dL — ABNORMAL HIGH (ref 70–99)
Potassium: 3.1 mmol/L — ABNORMAL LOW (ref 3.5–5.1)
Sodium: 151 mmol/L — ABNORMAL HIGH (ref 135–145)

## 2023-06-04 LAB — CBC
HCT: 34.7 % — ABNORMAL LOW (ref 36.0–46.0)
HCT: 32.6 % — ABNORMAL LOW (ref 36.0–46.0)
Hemoglobin: 10.5 g/dL — ABNORMAL LOW (ref 12.0–15.0)
Hemoglobin: 9.5 g/dL — ABNORMAL LOW (ref 12.0–15.0)
MCH: 24.8 pg — ABNORMAL LOW (ref 26.0–34.0)
MCH: 23.8 pg — ABNORMAL LOW (ref 26.0–34.0)
MCHC: 30.3 g/dL (ref 30.0–36.0)
MCHC: 29.1 g/dL — ABNORMAL LOW (ref 30.0–36.0)
MCV: 82 fL (ref 80.0–100.0)
MCV: 81.7 fL (ref 80.0–100.0)
Platelets: 190 10*3/uL (ref 150–400)
Platelets: 206 10*3/uL (ref 150–400)
RBC: 4.23 MIL/uL (ref 3.87–5.11)
RBC: 3.99 MIL/uL (ref 3.87–5.11)
RDW: 22.1 % — ABNORMAL HIGH (ref 11.5–15.5)
RDW: 21.8 % — ABNORMAL HIGH (ref 11.5–15.5)
WBC: 6 10*3/uL (ref 4.0–10.5)
WBC: 5.4 10*3/uL (ref 4.0–10.5)
nRBC: 0 % (ref 0.0–0.2)
nRBC: 0 % (ref 0.0–0.2)

## 2023-06-04 LAB — HEMOGLOBIN A1C
Hgb A1c MFr Bld: 5.9 % — ABNORMAL HIGH (ref 4.8–5.6)
Mean Plasma Glucose: 122.63 mg/dL

## 2023-06-04 LAB — CULTURE, BLOOD (ROUTINE X 2): Special Requests: ADEQUATE

## 2023-06-04 LAB — APTT
aPTT: 29 seconds (ref 24–36)
aPTT: 31 seconds (ref 24–36)

## 2023-06-04 LAB — SARS CORONAVIRUS 2 BY RT PCR: SARS Coronavirus 2 by RT PCR: NEGATIVE

## 2023-06-04 LAB — PROTIME-INR
INR: 1.1 (ref 0.8–1.2)
INR: 1.2 (ref 0.8–1.2)
Prothrombin Time: 14.6 seconds (ref 11.4–15.2)
Prothrombin Time: 15 seconds (ref 11.4–15.2)

## 2023-06-04 LAB — BILIRUBIN, DIRECT
Bilirubin, Direct: 0.2 mg/dL (ref 0.0–0.2)
Bilirubin, Direct: 0.1 mg/dL (ref 0.0–0.2)

## 2023-06-04 LAB — AMYLASE: Amylase: 85 U/L (ref 28–100)

## 2023-06-04 LAB — LIPASE, BLOOD: Lipase: 31 U/L (ref 11–51)

## 2023-06-04 MED ORDER — ACETAMINOPHEN 160 MG/5ML PO SOLN
650.0000 mg | ORAL | Status: DC | PRN
  Administered 2023-06-04: 650 mg
  Filled 2023-06-04: qty 20.3

## 2023-06-04 MED ORDER — INSULIN ASPART 100 UNIT/ML IJ SOLN
0.0000 [IU] | INTRAMUSCULAR | Status: DC
  Administered 2023-06-05 (×3): 3 [IU] via SUBCUTANEOUS
  Administered 2023-06-05: 5 [IU] via SUBCUTANEOUS
  Administered 2023-06-05: 3 [IU] via SUBCUTANEOUS
  Administered 2023-06-05: 5 [IU] via SUBCUTANEOUS
  Administered 2023-06-06: 3 [IU] via SUBCUTANEOUS
  Administered 2023-06-06 (×2): 8 [IU] via SUBCUTANEOUS
  Administered 2023-06-06: 3 [IU] via SUBCUTANEOUS
  Administered 2023-06-06: 2 [IU] via SUBCUTANEOUS
  Administered 2023-06-06: 3 [IU] via SUBCUTANEOUS
  Administered 2023-06-06: 2 [IU] via SUBCUTANEOUS
  Administered 2023-06-07: 3 [IU] via SUBCUTANEOUS
  Administered 2023-06-07: 5 [IU] via SUBCUTANEOUS
  Administered 2023-06-07: 8 [IU] via SUBCUTANEOUS
  Administered 2023-06-07: 3 [IU] via SUBCUTANEOUS

## 2023-06-04 MED ORDER — POTASSIUM CHLORIDE 20 MEQ PO PACK
40.0000 meq | PACK | Freq: Once | ORAL | Status: AC
  Administered 2023-06-04: 40 meq
  Filled 2023-06-04: qty 2

## 2023-06-04 MED ORDER — POTASSIUM CHLORIDE 20 MEQ PO PACK
20.0000 meq | PACK | Freq: Two times a day (BID) | ORAL | Status: DC
  Administered 2023-06-04 – 2023-06-07 (×7): 20 meq
  Filled 2023-06-04 (×7): qty 1

## 2023-06-04 NOTE — Progress Notes (Signed)
NAME:  Lindsay [name], MRN:  [id number], DOB:  10-Sep-1961, LOS: 13 ADMISSION DATE:  05/28/2023, CONSULTATION DATE:  6/11 REFERRING MD:  Dr. Elpidio Anis, CHIEF COMPLAINT:  Head bleed   History of Present Illness:  Patient is a 62 yo Female w/ pertinent PMH coronary artery vasospasm, DMT2, HTN presents to [facility] ED on 6/11 w/ head bleed.  On 6/11, patient was at work and had syncopal episode falling and hit back of head. Not on any blood thinners at home. EMS called. Found patient HR 160s and was given 6 mg adenosine then given 20 mg cardizem converting back to sinus rhythm.   Upon arrival to [facility] ED, patient hemodynamically stable and afebrile. EKG sinus rhythm. CT head showing hemorrhage in the anterior right frontal lobe, likely contusion, with surrounding edema; Additional SDH and SAH along the right frontal convexity, with 4 mm right-to-left midline shift. NSG consulted recommending SBP goal <150 and Keppra 500 bid. Recommend ICU admission for close neuro monitoring. PCCM consulted.  Pertinent ED labs: ethanol and UDS wnl, trop 9, K 3.1, mag 1.5, glucose 177 CTA chest with no PE; large hiatal hernia; b/l adrenal nodules possible adenomas.  Pertinent  Medical History   Past Medical History:  Diagnosis Date   Allergic rhinitis, cause unspecified    seasonal   Anginal pain (HCC) 2013   Stress   Arthritis    hips, thumb,knees   Diabetes mellitus without complication (HCC)    Essential hypertension, benign 12/12/2011   Joint pain    Multiple drug allergies    Obesity    Wears glasses    Wears partial dentures    upper     Significant Hospital Events: Including procedures, antibiotic start and stop dates in addition to other pertinent events   6/11 syncopal episode then hit head; ct head with head bleed and midline shift 6/15 episodes of SVT, lethargy 6/17 Worsening mental status and right blown pupil this am. Intubated 619 blood culture 1 of 2 Staph hominis, Staph  epidermidis  Interim History / Subjective:  No events, remains comatose.  Objective   Blood pressure 109/60, pulse (!) 47, temperature 98.1 F (36.7 C), temperature source Oral, resp. rate 15, height 5' 7.5" (1.715 m), weight 91.7 kg, last menstrual period 03/26/2012, SpO2 98 %.    Vent Mode: PSV;CPAP FiO2 (%):  [40 %] 40 % Set Rate:  [15 bmp] 15 bmp Vt Set:  [410 mL-500 mL] 500 mL PEEP:  [5 cmH20] 5 cmH20 Pressure Support:  [10 cmH20] 10 cmH20 Plateau Pressure:  [12 cmH20-15 cmH20] 14 cmH20   Intake/Output Summary (Last 24 hours) at 06/04/2023 7829 Last data filed at 06/04/2023 0800 Gross per 24 hour  Intake 2313.22 ml  Output 550 ml  Net 1763.22 ml    Filed Weights   05/31/23 0500 06/03/23 0500 06/04/23 0500  Weight: 90.8 kg 90.5 kg 91.7 kg   Physical Exam: No distress Angioedema noted ETT minimal secretions Small pupils, reactive +corneals No doll's  Triggers vent Weak cough Huston Foley Ext warm  Patient Lines/Drains/Airways Status     Active Line/Drains/Airways     Name Placement date Placement time Site Days   PICC Double Lumen 05/24/23 Right Basilic 38 cm 0 cm 05/24/23  5621  -- 11   NG/OG Vented/Dual Lumen 16 Fr. Oral Marking at nare/corner of mouth 65 cm 06/03/23  1117  Oral  1   External Urinary Catheter 05/31/23  1345  --  4   Airway 7.5 mm 05/28/23  1340  -- 7   Wound / Incision (Open or Dehisced) 06/06/2023 Laceration Head Left;Lower;Posterior 2 cm lac to back of head 06/09/2023  1700  Head  13             Sodium improving   Resolved Hospital Problem list   Lactic acidosis  Assessment & Plan:   Large right frontal lobe and mild right anterior temporal lobe hemorrhagic contusions with edema with mass effect/brain compression Left inferior frontal hemorrhagic contusion with edema Small subdural hemorrhage Posterior scalp lac Acute cortical ischemia throughout right PCA and small areas of acute ischemia of the right thalamus and bilateral  midbrain Recurrent SVT- now sinus brady on amio Hypertension Coronary vasospasm Intubated for airway protection Centrally-mediated shock Iatrogenic hypernatremia- now self-correcting  Unfortunately looks like not going to have any good outcome here short of nursing home and possible persistent vegetative state; some chance she could be locked in as well.  Family coming to terms with this  - Continue FWF, TF - Continue family discussions - Likely transition to comfort care later in week - Stop amiodarone - Neo for MAP 65 - DNR, anticipate in-hospital death  33 min cc time Myrla Halsted MD PCCM Myrla Halsted MD PCCM

## 2023-06-04 NOTE — Progress Notes (Signed)
PT Cancellation Note  Patient Details Name: MARIANNY GORIS MRN: [id number] DOB: 01-31-1961   Cancelled Treatment:    Reason Eval/Treat Not Completed: Medical issues which prohibited therapy  Will sign off therapy at this time.  Noted in critical care note that pt with poor prognosis with likely transition to comfort care and hospital death.    Anise Salvo, PT Acute Rehab [facility] [phone]  Rayetta Humphrey 06/04/2023, 1:48 PM

## 2023-06-04 NOTE — Progress Notes (Addendum)
eLink Physician-Brief Progress Note Patient Name: Lindsay [name] DOB: 04-18-1961 MRN: [id number]   Date of Service  06/04/2023  HPI/Events of Note  Notified that patient is being considered by Howard Pouch.  eICU Interventions  Labs ordered as per request.     Intervention Category Intermediate Interventions: Other:  Larinda Buttery 06/04/2023, 9:03 PM  11:32 PM Notified of glucose at 223.   Plan> Continue to monitor glucose q4hrs.  Insulin sliding scale ordered q4hrs.

## 2023-06-04 NOTE — Progress Notes (Signed)
Unsuccessfully attempted arterial line times two.

## 2023-06-05 ENCOUNTER — Inpatient Hospital Stay (HOSPITAL_COMMUNITY): Payer: 59

## 2023-06-05 DIAGNOSIS — I629 Nontraumatic intracranial hemorrhage, unspecified: Secondary | ICD-10-CM | POA: Diagnosis not present

## 2023-06-05 LAB — POCT I-STAT 7, (LYTES, BLD GAS, ICA,H+H)
Acid-Base Excess: 2 mmol/L (ref 0.0–2.0)
Acid-Base Excess: 2 mmol/L (ref 0.0–2.0)
Acid-Base Excess: 3 mmol/L — ABNORMAL HIGH (ref 0.0–2.0)
Bicarbonate: 26.4 mmol/L (ref 20.0–28.0)
Bicarbonate: 26.8 mmol/L (ref 20.0–28.0)
Bicarbonate: 27.3 mmol/L (ref 20.0–28.0)
Calcium, Ion: 1.21 mmol/L (ref 1.15–1.40)
Calcium, Ion: 1.22 mmol/L (ref 1.15–1.40)
Calcium, Ion: 1.23 mmol/L (ref 1.15–1.40)
HCT: 30 % — ABNORMAL LOW (ref 36.0–46.0)
HCT: 30 % — ABNORMAL LOW (ref 36.0–46.0)
HCT: 33 % — ABNORMAL LOW (ref 36.0–46.0)
Hemoglobin: 10.2 g/dL — ABNORMAL LOW (ref 12.0–15.0)
Hemoglobin: 10.2 g/dL — ABNORMAL LOW (ref 12.0–15.0)
Hemoglobin: 11.2 g/dL — ABNORMAL LOW (ref 12.0–15.0)
O2 Saturation: 100 %
O2 Saturation: 100 %
O2 Saturation: 99 %
Patient temperature: 98.1
Patient temperature: 98.6
Patient temperature: 98.6
Potassium: 3.2 mmol/L — ABNORMAL LOW (ref 3.5–5.1)
Potassium: 3.6 mmol/L (ref 3.5–5.1)
Potassium: 4 mmol/L (ref 3.5–5.1)
Sodium: 154 mmol/L — ABNORMAL HIGH (ref 135–145)
Sodium: 156 mmol/L — ABNORMAL HIGH (ref 135–145)
Sodium: 156 mmol/L — ABNORMAL HIGH (ref 135–145)
TCO2: 27 mmol/L (ref 22–32)
TCO2: 28 mmol/L (ref 22–32)
TCO2: 28 mmol/L (ref 22–32)
pCO2 arterial: 37.2 mmHg (ref 32–48)
pCO2 arterial: 40.3 mmHg (ref 32–48)
pCO2 arterial: 43.2 mmHg (ref 32–48)
pH, Arterial: 7.4 (ref 7.35–7.45)
pH, Arterial: 7.437 (ref 7.35–7.45)
pH, Arterial: 7.459 — ABNORMAL HIGH (ref 7.35–7.45)
pO2, Arterial: 145 mmHg — ABNORMAL HIGH (ref 83–108)
pO2, Arterial: 168 mmHg — ABNORMAL HIGH (ref 83–108)
pO2, Arterial: 518 mmHg — ABNORMAL HIGH (ref 83–108)

## 2023-06-05 LAB — CBC
HCT: 36.3 % (ref 36.0–46.0)
HCT: 35.1 % — ABNORMAL LOW (ref 36.0–46.0)
HCT: 36.6 % (ref 36.0–46.0)
HCT: 33.1 % — ABNORMAL LOW (ref 36.0–46.0)
Hemoglobin: 10.7 g/dL — ABNORMAL LOW (ref 12.0–15.0)
Hemoglobin: 10.6 g/dL — ABNORMAL LOW (ref 12.0–15.0)
Hemoglobin: 10.9 g/dL — ABNORMAL LOW (ref 12.0–15.0)
Hemoglobin: 9.9 g/dL — ABNORMAL LOW (ref 12.0–15.0)
MCH: 24.3 pg — ABNORMAL LOW (ref 26.0–34.0)
MCH: 24.5 pg — ABNORMAL LOW (ref 26.0–34.0)
MCH: 23.8 pg — ABNORMAL LOW (ref 26.0–34.0)
MCH: 23.7 pg — ABNORMAL LOW (ref 26.0–34.0)
MCHC: 29.5 g/dL — ABNORMAL LOW (ref 30.0–36.0)
MCHC: 30.2 g/dL (ref 30.0–36.0)
MCHC: 29.8 g/dL — ABNORMAL LOW (ref 30.0–36.0)
MCHC: 29.9 g/dL — ABNORMAL LOW (ref 30.0–36.0)
MCV: 82.5 fL (ref 80.0–100.0)
MCV: 81.3 fL (ref 80.0–100.0)
MCV: 79.9 fL — ABNORMAL LOW (ref 80.0–100.0)
MCV: 79.2 fL — ABNORMAL LOW (ref 80.0–100.0)
Platelets: 216 10*3/uL (ref 150–400)
Platelets: 213 10*3/uL (ref 150–400)
Platelets: 242 10*3/uL (ref 150–400)
Platelets: 226 10*3/uL (ref 150–400)
RBC: 4.4 MIL/uL (ref 3.87–5.11)
RBC: 4.32 MIL/uL (ref 3.87–5.11)
RBC: 4.58 MIL/uL (ref 3.87–5.11)
RBC: 4.18 MIL/uL (ref 3.87–5.11)
RDW: 22 % — ABNORMAL HIGH (ref 11.5–15.5)
RDW: 21.8 % — ABNORMAL HIGH (ref 11.5–15.5)
RDW: 22 % — ABNORMAL HIGH (ref 11.5–15.5)
RDW: 21.8 % — ABNORMAL HIGH (ref 11.5–15.5)
WBC: 7.2 10*3/uL (ref 4.0–10.5)
WBC: 6.2 10*3/uL (ref 4.0–10.5)
WBC: 6.7 10*3/uL (ref 4.0–10.5)
WBC: 6 10*3/uL (ref 4.0–10.5)
nRBC: 0 % (ref 0.0–0.2)
nRBC: 0 % (ref 0.0–0.2)
nRBC: 0.3 % — ABNORMAL HIGH (ref 0.0–0.2)
nRBC: 0.3 % — ABNORMAL HIGH (ref 0.0–0.2)

## 2023-06-05 LAB — COMPREHENSIVE METABOLIC PANEL
ALT: 88 U/L — ABNORMAL HIGH (ref 0–44)
ALT: 88 U/L — ABNORMAL HIGH (ref 0–44)
ALT: 73 U/L — ABNORMAL HIGH (ref 0–44)
ALT: 74 U/L — ABNORMAL HIGH (ref 0–44)
AST: 48 U/L — ABNORMAL HIGH (ref 15–41)
AST: 47 U/L — ABNORMAL HIGH (ref 15–41)
AST: 36 U/L (ref 15–41)
AST: 34 U/L (ref 15–41)
Albumin: 2.7 g/dL — ABNORMAL LOW (ref 3.5–5.0)
Albumin: 2.6 g/dL — ABNORMAL LOW (ref 3.5–5.0)
Albumin: 2.3 g/dL — ABNORMAL LOW (ref 3.5–5.0)
Albumin: 2.5 g/dL — ABNORMAL LOW (ref 3.5–5.0)
Alkaline Phosphatase: 62 U/L (ref 38–126)
Alkaline Phosphatase: 63 U/L (ref 38–126)
Alkaline Phosphatase: 54 U/L (ref 38–126)
Alkaline Phosphatase: 52 U/L (ref 38–126)
Anion gap: 9 (ref 5–15)
Anion gap: 9 (ref 5–15)
Anion gap: 8 (ref 5–15)
Anion gap: 13 (ref 5–15)
BUN: 13 mg/dL (ref 8–23)
BUN: 13 mg/dL (ref 8–23)
BUN: 13 mg/dL (ref 8–23)
BUN: 15 mg/dL (ref 8–23)
CO2: 26 mmol/L (ref 22–32)
CO2: 25 mmol/L (ref 22–32)
CO2: 26 mmol/L (ref 22–32)
CO2: 25 mmol/L (ref 22–32)
Calcium: 8.8 mg/dL — ABNORMAL LOW (ref 8.9–10.3)
Calcium: 8.8 mg/dL — ABNORMAL LOW (ref 8.9–10.3)
Calcium: 8.4 mg/dL — ABNORMAL LOW (ref 8.9–10.3)
Calcium: 8.8 mg/dL — ABNORMAL LOW (ref 8.9–10.3)
Chloride: 116 mmol/L — ABNORMAL HIGH (ref 98–111)
Chloride: 117 mmol/L — ABNORMAL HIGH (ref 98–111)
Chloride: 117 mmol/L — ABNORMAL HIGH (ref 98–111)
Chloride: 116 mmol/L — ABNORMAL HIGH (ref 98–111)
Creatinine, Ser: 0.57 mg/dL (ref 0.44–1.00)
Creatinine, Ser: 0.5 mg/dL (ref 0.44–1.00)
Creatinine, Ser: 0.53 mg/dL (ref 0.44–1.00)
Creatinine, Ser: 0.54 mg/dL (ref 0.44–1.00)
GFR, Estimated: >60 mL/min (ref >60)
GFR, Estimated: >60 mL/min (ref >60)
GFR, Estimated: >60 mL/min (ref >60)
GFR, Estimated: >60 mL/min (ref >60)
Glucose, Bld: 172 mg/dL — ABNORMAL HIGH (ref 70–99)
Glucose, Bld: 180 mg/dL — ABNORMAL HIGH (ref 70–99)
Glucose, Bld: 191 mg/dL — ABNORMAL HIGH (ref 70–99)
Glucose, Bld: 164 mg/dL — ABNORMAL HIGH (ref 70–99)
Potassium: 3.8 mmol/L (ref 3.5–5.1)
Potassium: 3.7 mmol/L (ref 3.5–5.1)
Potassium: 3.3 mmol/L — ABNORMAL LOW (ref 3.5–5.1)
Potassium: 3.2 mmol/L — ABNORMAL LOW (ref 3.5–5.1)
Sodium: 151 mmol/L — ABNORMAL HIGH (ref 135–145)
Sodium: 151 mmol/L — ABNORMAL HIGH (ref 135–145)
Sodium: 151 mmol/L — ABNORMAL HIGH (ref 135–145)
Sodium: 154 mmol/L — ABNORMAL HIGH (ref 135–145)
Total Bilirubin: 0.3 mg/dL (ref 0.3–1.2)
Total Bilirubin: 0.5 mg/dL (ref 0.3–1.2)
Total Bilirubin: 0.3 mg/dL (ref 0.3–1.2)
Total Bilirubin: 0.3 mg/dL (ref 0.3–1.2)
Total Protein: 6.1 g/dL — ABNORMAL LOW (ref 6.5–8.1)
Total Protein: 5.9 g/dL — ABNORMAL LOW (ref 6.5–8.1)
Total Protein: 5.4 g/dL — ABNORMAL LOW (ref 6.5–8.1)
Total Protein: 5.6 g/dL — ABNORMAL LOW (ref 6.5–8.1)

## 2023-06-05 LAB — PROTIME-INR
INR: 1.1 (ref 0.8–1.2)
INR: 1.1 (ref 0.8–1.2)
INR: 1.1 (ref 0.8–1.2)
INR: 1.1 (ref 0.8–1.2)
Prothrombin Time: 14.2 seconds (ref 11.4–15.2)
Prothrombin Time: 13.9 seconds (ref 11.4–15.2)
Prothrombin Time: 14.2 seconds (ref 11.4–15.2)
Prothrombin Time: 14.5 seconds (ref 11.4–15.2)

## 2023-06-05 LAB — GLUCOSE, CAPILLARY
Glucose-Capillary: 149 mg/dL — ABNORMAL HIGH (ref 70–99)
Glucose-Capillary: 163 mg/dL — ABNORMAL HIGH (ref 70–99)
Glucose-Capillary: 170 mg/dL — ABNORMAL HIGH (ref 70–99)
Glucose-Capillary: 186 mg/dL — ABNORMAL HIGH (ref 70–99)
Glucose-Capillary: 192 mg/dL — ABNORMAL HIGH (ref 70–99)
Glucose-Capillary: 206 mg/dL — ABNORMAL HIGH (ref 70–99)
Glucose-Capillary: 207 mg/dL — ABNORMAL HIGH (ref 70–99)

## 2023-06-05 LAB — LACTIC ACID, PLASMA
Lactic Acid, Venous: 2.2 mmol/L (ref 0.5–1.9)
Lactic Acid, Venous: 1.2 mmol/L (ref 0.5–1.9)

## 2023-06-05 LAB — APTT
aPTT: 31 seconds (ref 24–36)
aPTT: 34 seconds (ref 24–36)
aPTT: 27 seconds (ref 24–36)
aPTT: 23 seconds — ABNORMAL LOW (ref 24–36)

## 2023-06-05 LAB — BILIRUBIN, DIRECT
Bilirubin, Direct: 0.1 mg/dL (ref 0.0–0.2)
Bilirubin, Direct: 0.1 mg/dL (ref 0.0–0.2)
Bilirubin, Direct: 0.1 mg/dL (ref 0.0–0.2)
Bilirubin, Direct: 0.1 mg/dL (ref 0.0–0.2)

## 2023-06-05 LAB — CULTURE, BLOOD (ROUTINE X 2)

## 2023-06-05 MED ORDER — VASOPRESSIN 20 UNITS/100 ML INFUSION FOR SHOCK
0.0000 [IU]/min | INTRAVENOUS | Status: DC
  Administered 2023-06-05 – 2023-06-06 (×3): 0.04 [IU]/min via INTRAVENOUS
  Filled 2023-06-05 (×2): qty 100

## 2023-06-05 MED ORDER — METRONIDAZOLE 500 MG/100ML IV SOLN
500.0000 mg | Freq: Two times a day (BID) | INTRAVENOUS | Status: DC
  Administered 2023-06-05 – 2023-06-07 (×5): 500 mg via INTRAVENOUS
  Filled 2023-06-05 (×5): qty 100

## 2023-06-05 MED ORDER — LACTATED RINGERS IV BOLUS
500.0000 mL | Freq: Once | INTRAVENOUS | Status: AC
  Administered 2023-06-05: 500 mL via INTRAVENOUS

## 2023-06-05 MED ORDER — VASOPRESSIN 20 UNITS/100 ML INFUSION FOR ORGAN DONOR
0.4000 [IU]/h | INTRAVENOUS | Status: DC
  Administered 2023-06-05: 0.4 [IU]/h via INTRAVENOUS
  Filled 2023-06-05: qty 100

## 2023-06-05 MED ORDER — SODIUM CHLORIDE 0.9 % IV SOLN: 1.0000 g | Freq: Three times a day (TID) | INTRAVENOUS | Status: DC

## 2023-06-05 MED ORDER — SODIUM CHLORIDE 0.9 % IV SOLN
2.0000 g | Freq: Three times a day (TID) | INTRAVENOUS | Status: DC
  Administered 2023-06-05 – 2023-06-07 (×7): 2 g via INTRAVENOUS
  Filled 2023-06-05 (×7): qty 12.5

## 2023-06-05 NOTE — Progress Notes (Signed)
Recruitment maneuver performed as ordered and ABG drawn.  Results are ph 7.50  pco2 34  po2 433  hco3-  26.

## 2023-06-05 NOTE — Progress Notes (Signed)
NAME:  Lindsay [name], MRN:  [id number], DOB:  Sep 01, 1961, LOS: 14 ADMISSION DATE:  05-31-23, CONSULTATION DATE:  05-31-23 REFERRING MD:  Dr. Elpidio Anis, CHIEF COMPLAINT:  Head bleed   History of Present Illness:  Patient is a 62 yo Female w/ pertinent PMH coronary artery vasospasm, DMT2, HTN presents to [facility] ED on 05/31/2023 w/ head bleed.  On 05/31/23, patient was at work and had syncopal episode falling and hit back of head. Not on any blood thinners at home. EMS called. Found patient HR 160s and was given 6 mg adenosine then given 20 mg cardizem converting back to sinus rhythm.   Upon arrival to [facility] ED, patient hemodynamically stable and afebrile. EKG sinus rhythm. CT head showing hemorrhage in the anterior right frontal lobe, likely contusion, with surrounding edema; Additional SDH and SAH along the right frontal convexity, with 4 mm right-to-left midline shift. NSG consulted recommending SBP goal <150 and Keppra 500 bid. Recommend ICU admission for close neuro monitoring. PCCM consulted.  Pertinent ED labs: ethanol and UDS wnl, trop 9, K 3.1, mag 1.5, glucose 177 CTA chest with no PE; large hiatal hernia; b/l adrenal nodules possible adenomas.  Pertinent  Medical History   Past Medical History:  Diagnosis Date   Allergic rhinitis, cause unspecified    seasonal   Anginal pain (HCC) 2013   Stress   Arthritis    hips, thumb,knees   Diabetes mellitus without complication (HCC)    Essential hypertension, benign 12/12/2011   Joint pain    Multiple drug allergies    Obesity    Wears glasses    Wears partial dentures    upper     Significant Hospital Events: Including procedures, antibiotic start and stop dates in addition to other pertinent events   05-31-2023 syncopal episode then hit head; ct head with head bleed and midline shift 6/15 episodes of SVT, lethargy 6/17 Worsening mental status and right blown pupil this am. Intubated 619 blood culture 1 of 2 Staph hominis, Staph  epidermidis  Interim History / Subjective:  No events.  Objective   Blood pressure 124/67, pulse (!) 49, temperature (!) 97.4 F (36.3 C), temperature source Axillary, resp. rate 15, height 5' 7.5" (1.715 m), weight 94.2 kg, last menstrual period 03/26/2012, SpO2 100 %.    Vent Mode: PRVC FiO2 (%):  [40 %] 40 % Set Rate:  [15 bmp-16 bmp] 16 bmp Vt Set:  [500 mL] 500 mL PEEP:  [5 cmH20] 5 cmH20 Pressure Support:  [10 cmH20] 10 cmH20 Plateau Pressure:  [15 cmH20-19 cmH20] 19 cmH20   Intake/Output Summary (Last 24 hours) at 06/05/2023 0736 Last data filed at 06/05/2023 0600 Gross per 24 hour  Intake 1343.29 ml  Output 1155 ml  Net 188.29 ml    Filed Weights   06/03/23 0500 06/04/23 0500 06/05/23 0500  Weight: 90.5 kg 91.7 kg 94.2 kg   Physical Exam: No distress Angioedema noted Some decorticate posturing Abd soft Ext warm Heart sounds regular Lungs clear Triggers vent +cough Pupils reactive  Patient Lines/Drains/Airways Status     Active Line/Drains/Airways     Name Placement date Placement time Site Days   PICC Double Lumen 05/24/23 Right Basilic 38 cm 0 cm 05/24/23  4098  -- 11   NG/OG Vented/Dual Lumen 16 Fr. Oral Marking at nare/corner of mouth 65 cm 06/03/23  1117  Oral  1   External Urinary Catheter 05/31/23  1345  --  4   Airway 7.5 mm 05/28/23  1340  --  7   Wound / Incision (Open or Dehisced) Jun 20, 2023 Laceration Head Left;Lower;Posterior 2 cm lac to back of head 06-20-23  1700  Head  13           Labs reviewed: sodium up but stable   Resolved Hospital Problem list   Lactic acidosis  Assessment & Plan:   Large right frontal lobe and mild right anterior temporal lobe hemorrhagic contusions with edema with mass effect/brain compression Left inferior frontal hemorrhagic contusion with edema Small subdural hemorrhage Posterior scalp lac Acute cortical ischemia throughout right PCA and small areas of acute ischemia of the right thalamus and bilateral  midbrain Recurrent SVT- now sinus brady on amio, stopped 6/24 Hypertension Coronary vasospasm Intubated for airway protection Centrally-mediated shock Iatrogenic hypernatremia- now self-correcting slowly  Unfortunately looks like not going to have any good outcome here short of nursing home and possible persistent vegetative state; some chance she could be locked in as well.  Family seems at peace with this.  They are interested in her being a donor.  - A line/bronch per [facility] - Continue FWF, TF, vent support - Neo for MAP 65 - DNR  31 min cc time independent of procedures  Myrla Halsted MD PCCM

## 2023-06-05 NOTE — Progress Notes (Signed)
OT Cancellation/Discharge Note  Patient Details Name: VEERA STAPLETON MRN: [id number] DOB: [birth year]   Cancelled Treatment:    Reason Eval/Treat Not Completed: Other (comment) (Will sign off therapy at this time.  Noted in critical care note that pt with poor prognosis with likely transition to comfort care.)  No therapy goals have been met at time of discharge.   Limmie Patricia, OTR/L,CBIS  Supplemental OT - [facility] and [facility] Secure Chat Preferred   06/05/2023, 8:48 AM

## 2023-06-05 NOTE — Procedures (Signed)
Arterial Catheter Insertion Procedure Note  Lindsay [name]  [id number]  1961-11-06  Date:06/05/23  Time:8:00 AM    Provider Performing: Lorin Glass    Procedure: Insertion of Arterial Line (47425) with US guidance (95638)   Indication(s) Blood pressure monitoring and/or need for frequent ABGs  Consent [facility]  Anesthesia None   Time Out Verified patient identification, verified procedure, site/side was marked, verified correct patient position, special equipment/implants available, medications/allergies/relevant history reviewed, required imaging and test results available.   Sterile Technique Maximal sterile technique including full sterile barrier drape, hand hygiene, sterile gown, sterile gloves, mask, hair covering, sterile ultrasound probe cover (if used).   Procedure Description Area of catheter insertion was cleaned with chlorhexidine and draped in sterile fashion. With real-time ultrasound guidance an arterial catheter was placed into the left radial artery.  Appropriate arterial tracings confirmed on monitor.     Complications/Tolerance None; patient tolerated the procedure well.   EBL Minimal   Specimen(s) None

## 2023-06-05 NOTE — Progress Notes (Addendum)
Information not transferring from arterial line to chart due to system outage. Pressures per art line running in the 170's to 180's systolic. [facility] made aware and prn medications administered.   1500 Noted patient urine output increased from 100 per 2 hours to 400 ml in less than an hour. Patient heartrate also increased to 130's. Notified [facility] with new orders.   1555 Patient BP now dropping rapidly with arterial line reading 78/41 map of 50. Started neo per [facility]. Arterial line info still not transferring to chart due to system outage.  1645 Patient no longer has cough or gag. BP stabilized on neo and vaso.

## 2023-06-05 NOTE — Procedures (Signed)
Bronchoscopy Procedure Note  ALEATHEA PUGMIRE  [id number]  1961/04/08  Date:06/05/23  Time:10:36 AM   Provider Performing:Hawley Michel C Katrinka Blazing   Procedure(s):  Flexible bronchoscopy with bronchial alveolar lavage (86578)  Indication(s) Donor workup  Consent Donor workup  Anesthesia None   Time Out Verified patient identification, verified procedure, site/side was marked, verified correct patient position, special equipment/implants available, medications/allergies/relevant history reviewed, required imaging and test results available.   Sterile Technique Usual hand hygiene, masks, gowns, and gloves were used   Procedure Description Bronchoscope advanced through endotracheal tube and into airway.  Airways were examined down to subsegmental level with findings noted below.   Following diagnostic evaluation, BAL(s) performed in RLL with normal saline and return of mostly clear plug-filled fluid  Findings:  - ETT in good position - Moderate airway erythema - Mild bronchotracheomalacia - No endobronchial lesions  Complications/Tolerance None; patient tolerated the procedure well. Chest X-ray is not needed post procedure.   EBL Minimal   Specimen(s) RLL BAL

## 2023-06-05 NOTE — Progress Notes (Signed)
Recruitment maneuver done with patient being placed on 100% fio2 for 30 minutes and peep set at [facility] for 20 seconds. Patients blood pressure fell during peep recruitment, so peep was placed back at 5cm. Blood Pressure returned back to original reading. [facility] made aware of decrease in blood pressure. Patient remained on 100% for 30 minutes.

## 2023-06-05 NOTE — Progress Notes (Signed)
ABG obtained after doing recruitment maneuver.   Results are:   pH 7.43 PcO2 40 PO2 518 HCO3 27  [facility] at bedside and notified of results. System is down no ABG results will not transfer to the computer.

## 2023-06-06 ENCOUNTER — Other Ambulatory Visit (HOSPITAL_COMMUNITY): Payer: 59

## 2023-06-06 DIAGNOSIS — Z5289 Donor of other specified organs or tissues: Secondary | ICD-10-CM | POA: Diagnosis not present

## 2023-06-06 DIAGNOSIS — I629 Nontraumatic intracranial hemorrhage, unspecified: Secondary | ICD-10-CM | POA: Diagnosis not present

## 2023-06-06 LAB — COMPREHENSIVE METABOLIC PANEL
ALT: 70 U/L — ABNORMAL HIGH (ref 0–44)
ALT: 62 U/L — ABNORMAL HIGH (ref 0–44)
ALT: 64 U/L — ABNORMAL HIGH (ref 0–44)
ALT: 59 U/L — ABNORMAL HIGH (ref 0–44)
AST: 31 U/L (ref 15–41)
AST: 25 U/L (ref 15–41)
AST: 25 U/L (ref 15–41)
AST: 23 U/L (ref 15–41)
Albumin: 2.4 g/dL — ABNORMAL LOW (ref 3.5–5.0)
Albumin: 2.4 g/dL — ABNORMAL LOW (ref 3.5–5.0)
Albumin: 2.4 g/dL — ABNORMAL LOW (ref 3.5–5.0)
Albumin: 2.4 g/dL — ABNORMAL LOW (ref 3.5–5.0)
Alkaline Phosphatase: 52 U/L (ref 38–126)
Alkaline Phosphatase: 48 U/L (ref 38–126)
Alkaline Phosphatase: 49 U/L (ref 38–126)
Alkaline Phosphatase: 47 U/L (ref 38–126)
Anion gap: 8 (ref 5–15)
Anion gap: 8 (ref 5–15)
Anion gap: 9 (ref 5–15)
Anion gap: 13 (ref 5–15)
BUN: 12 mg/dL (ref 8–23)
BUN: 15 mg/dL (ref 8–23)
BUN: 16 mg/dL (ref 8–23)
BUN: 17 mg/dL (ref 8–23)
CO2: 25 mmol/L (ref 22–32)
CO2: 26 mmol/L (ref 22–32)
CO2: 25 mmol/L (ref 22–32)
CO2: 24 mmol/L (ref 22–32)
Calcium: 8.5 mg/dL — ABNORMAL LOW (ref 8.9–10.3)
Calcium: 8.2 mg/dL — ABNORMAL LOW (ref 8.9–10.3)
Calcium: 8.3 mg/dL — ABNORMAL LOW (ref 8.9–10.3)
Calcium: 8.7 mg/dL — ABNORMAL LOW (ref 8.9–10.3)
Chloride: 117 mmol/L — ABNORMAL HIGH (ref 98–111)
Chloride: 115 mmol/L — ABNORMAL HIGH (ref 98–111)
Chloride: 114 mmol/L — ABNORMAL HIGH (ref 98–111)
Chloride: 111 mmol/L (ref 98–111)
Creatinine, Ser: 0.48 mg/dL (ref 0.44–1.00)
Creatinine, Ser: 0.59 mg/dL (ref 0.44–1.00)
Creatinine, Ser: 0.57 mg/dL (ref 0.44–1.00)
Creatinine, Ser: 0.58 mg/dL (ref 0.44–1.00)
GFR, Estimated: >60 mL/min (ref >60)
GFR, Estimated: >60 mL/min (ref >60)
GFR, Estimated: >60 mL/min (ref >60)
GFR, Estimated: >60 mL/min (ref >60)
Glucose, Bld: 160 mg/dL — ABNORMAL HIGH (ref 70–99)
Glucose, Bld: 262 mg/dL — ABNORMAL HIGH (ref 70–99)
Glucose, Bld: 198 mg/dL — ABNORMAL HIGH (ref 70–99)
Glucose, Bld: 152 mg/dL — ABNORMAL HIGH (ref 70–99)
Potassium: 3.3 mmol/L — ABNORMAL LOW (ref 3.5–5.1)
Potassium: 3.4 mmol/L — ABNORMAL LOW (ref 3.5–5.1)
Potassium: 3.8 mmol/L (ref 3.5–5.1)
Potassium: 3.6 mmol/L (ref 3.5–5.1)
Sodium: 150 mmol/L — ABNORMAL HIGH (ref 135–145)
Sodium: 149 mmol/L — ABNORMAL HIGH (ref 135–145)
Sodium: 148 mmol/L — ABNORMAL HIGH (ref 135–145)
Sodium: 148 mmol/L — ABNORMAL HIGH (ref 135–145)
Total Bilirubin: 0.5 mg/dL (ref 0.3–1.2)
Total Bilirubin: 0.5 mg/dL (ref 0.3–1.2)
Total Bilirubin: 0.5 mg/dL (ref 0.3–1.2)
Total Bilirubin: 0.3 mg/dL (ref 0.3–1.2)
Total Protein: 5.7 g/dL — ABNORMAL LOW (ref 6.5–8.1)
Total Protein: 5.4 g/dL — ABNORMAL LOW (ref 6.5–8.1)
Total Protein: 5.8 g/dL — ABNORMAL LOW (ref 6.5–8.1)
Total Protein: 6 g/dL — ABNORMAL LOW (ref 6.5–8.1)

## 2023-06-06 LAB — CBC
HCT: 32.5 % — ABNORMAL LOW (ref 36.0–46.0)
HCT: 30.1 % — ABNORMAL LOW (ref 36.0–46.0)
HCT: 31 % — ABNORMAL LOW (ref 36.0–46.0)
HCT: 31.9 % — ABNORMAL LOW (ref 36.0–46.0)
Hemoglobin: 9.8 g/dL — ABNORMAL LOW (ref 12.0–15.0)
Hemoglobin: 9.1 g/dL — ABNORMAL LOW (ref 12.0–15.0)
Hemoglobin: 9.5 g/dL — ABNORMAL LOW (ref 12.0–15.0)
Hemoglobin: 9.7 g/dL — ABNORMAL LOW (ref 12.0–15.0)
MCH: 23.9 pg — ABNORMAL LOW (ref 26.0–34.0)
MCH: 23.8 pg — ABNORMAL LOW (ref 26.0–34.0)
MCH: 24.7 pg — ABNORMAL LOW (ref 26.0–34.0)
MCH: 24.4 pg — ABNORMAL LOW (ref 26.0–34.0)
MCHC: 30.2 g/dL (ref 30.0–36.0)
MCHC: 30.2 g/dL (ref 30.0–36.0)
MCHC: 30.6 g/dL (ref 30.0–36.0)
MCHC: 30.4 g/dL (ref 30.0–36.0)
MCV: 79.3 fL — ABNORMAL LOW (ref 80.0–100.0)
MCV: 78.8 fL — ABNORMAL LOW (ref 80.0–100.0)
MCV: 80.7 fL (ref 80.0–100.0)
MCV: 80.2 fL (ref 80.0–100.0)
Platelets: 225 10*3/uL (ref 150–400)
Platelets: 195 10*3/uL (ref 150–400)
Platelets: 203 10*3/uL (ref 150–400)
Platelets: 222 10*3/uL (ref 150–400)
RBC: 4.1 MIL/uL (ref 3.87–5.11)
RBC: 3.82 MIL/uL — ABNORMAL LOW (ref 3.87–5.11)
RBC: 3.84 MIL/uL — ABNORMAL LOW (ref 3.87–5.11)
RBC: 3.98 MIL/uL (ref 3.87–5.11)
RDW: 21.8 % — ABNORMAL HIGH (ref 11.5–15.5)
RDW: 21.5 % — ABNORMAL HIGH (ref 11.5–15.5)
RDW: 21.5 % — ABNORMAL HIGH (ref 11.5–15.5)
RDW: 21.4 % — ABNORMAL HIGH (ref 11.5–15.5)
WBC: 6.4 10*3/uL (ref 4.0–10.5)
WBC: 6.6 10*3/uL (ref 4.0–10.5)
WBC: 5.8 10*3/uL (ref 4.0–10.5)
WBC: 8.3 10*3/uL (ref 4.0–10.5)
nRBC: 0.3 % — ABNORMAL HIGH (ref 0.0–0.2)
nRBC: 0 % (ref 0.0–0.2)
nRBC: 0.3 % — ABNORMAL HIGH (ref 0.0–0.2)
nRBC: 0 % (ref 0.0–0.2)

## 2023-06-06 LAB — URINALYSIS, W/ REFLEX TO CULTURE (INFECTION SUSPECTED)
Bacteria, UA: NONE SEEN
Bilirubin Urine: NEGATIVE
Glucose, UA: 50 mg/dL — AB
Hgb urine dipstick: NEGATIVE
Ketones, ur: NEGATIVE mg/dL
Leukocytes,Ua: NEGATIVE
Nitrite: NEGATIVE
Protein, ur: NEGATIVE mg/dL
Specific Gravity, Urine: 1.019 (ref 1.005–1.030)
pH: 7 (ref 5.0–8.0)

## 2023-06-06 LAB — POCT I-STAT 7, (LYTES, BLD GAS, ICA,H+H)
Acid-Base Excess: 3 mmol/L — ABNORMAL HIGH (ref 0.0–2.0)
Acid-Base Excess: 5 mmol/L — ABNORMAL HIGH (ref 0.0–2.0)
Acid-Base Excess: 2 mmol/L (ref 0.0–2.0)
Acid-Base Excess: 2 mmol/L (ref 0.0–2.0)
Acid-Base Excess: 1 mmol/L (ref 0.0–2.0)
Acid-Base Excess: 5 mmol/L — ABNORMAL HIGH (ref 0.0–2.0)
Acid-base deficit: 1 mmol/L (ref 0.0–2.0)
Bicarbonate: 26.2 mmol/L (ref 20.0–28.0)
Bicarbonate: 28 mmol/L (ref 20.0–28.0)
Bicarbonate: 25.7 mmol/L (ref 20.0–28.0)
Bicarbonate: 26.2 mmol/L (ref 20.0–28.0)
Bicarbonate: 30.5 mmol/L — ABNORMAL HIGH (ref 20.0–28.0)
Bicarbonate: 26.3 mmol/L (ref 20.0–28.0)
Bicarbonate: 29 mmol/L — ABNORMAL HIGH (ref 20.0–28.0)
Calcium, Ion: 1.22 mmol/L (ref 1.15–1.40)
Calcium, Ion: 1.18 mmol/L (ref 1.15–1.40)
Calcium, Ion: 1.14 mmol/L — ABNORMAL LOW (ref 1.15–1.40)
Calcium, Ion: 1.14 mmol/L — ABNORMAL LOW (ref 1.15–1.40)
Calcium, Ion: 1.24 mmol/L (ref 1.15–1.40)
Calcium, Ion: 1.19 mmol/L (ref 1.15–1.40)
Calcium, Ion: 1.2 mmol/L (ref 1.15–1.40)
HCT: 33 % — ABNORMAL LOW (ref 36.0–46.0)
HCT: 29 % — ABNORMAL LOW (ref 36.0–46.0)
HCT: 28 % — ABNORMAL LOW (ref 36.0–46.0)
HCT: 29 % — ABNORMAL LOW (ref 36.0–46.0)
HCT: 33 % — ABNORMAL LOW (ref 36.0–46.0)
HCT: 27 % — ABNORMAL LOW (ref 36.0–46.0)
HCT: 29 % — ABNORMAL LOW (ref 36.0–46.0)
Hemoglobin: 11.2 g/dL — ABNORMAL LOW (ref 12.0–15.0)
Hemoglobin: 9.9 g/dL — ABNORMAL LOW (ref 12.0–15.0)
Hemoglobin: 9.5 g/dL — ABNORMAL LOW (ref 12.0–15.0)
Hemoglobin: 9.9 g/dL — ABNORMAL LOW (ref 12.0–15.0)
Hemoglobin: 11.2 g/dL — ABNORMAL LOW (ref 12.0–15.0)
Hemoglobin: 9.2 g/dL — ABNORMAL LOW (ref 12.0–15.0)
Hemoglobin: 9.9 g/dL — ABNORMAL LOW (ref 12.0–15.0)
O2 Saturation: 100 %
O2 Saturation: 100 %
O2 Saturation: 100 %
O2 Saturation: 100 %
O2 Saturation: 100 %
O2 Saturation: 100 %
O2 Saturation: 100 %
Patient temperature: 98.1
Patient temperature: 35.8
Patient temperature: 98.6
Patient temperature: 98.6
Patient temperature: 36.9
Patient temperature: 97.3
Patient temperature: 36.2
Potassium: 3.8 mmol/L (ref 3.5–5.1)
Potassium: 3.3 mmol/L — ABNORMAL LOW (ref 3.5–5.1)
Potassium: 2.9 mmol/L — ABNORMAL LOW (ref 3.5–5.1)
Potassium: 3.2 mmol/L — ABNORMAL LOW (ref 3.5–5.1)
Potassium: 3.4 mmol/L — ABNORMAL LOW (ref 3.5–5.1)
Potassium: 3.7 mmol/L (ref 3.5–5.1)
Potassium: 3.9 mmol/L (ref 3.5–5.1)
Sodium: 151 mmol/L — ABNORMAL HIGH (ref 135–145)
Sodium: 152 mmol/L — ABNORMAL HIGH (ref 135–145)
Sodium: 151 mmol/L — ABNORMAL HIGH (ref 135–145)
Sodium: 152 mmol/L — ABNORMAL HIGH (ref 135–145)
Sodium: 150 mmol/L — ABNORMAL HIGH (ref 135–145)
Sodium: 149 mmol/L — ABNORMAL HIGH (ref 135–145)
Sodium: 148 mmol/L — ABNORMAL HIGH (ref 135–145)
TCO2: 27 mmol/L (ref 22–32)
TCO2: 29 mmol/L (ref 22–32)
TCO2: 27 mmol/L (ref 22–32)
TCO2: 28 mmol/L (ref 22–32)
TCO2: 32 mmol/L (ref 22–32)
TCO2: 28 mmol/L (ref 22–32)
TCO2: 30 mmol/L (ref 22–32)
pCO2 arterial: 33.7 mmHg (ref 32–48)
pCO2 arterial: 33 mmHg (ref 32–48)
pCO2 arterial: 36.9 mmHg (ref 32–48)
pCO2 arterial: 54.2 mmHg — ABNORMAL HIGH (ref 32–48)
pCO2 arterial: 64.6 mmHg — ABNORMAL HIGH (ref 32–48)
pCO2 arterial: 40.8 mmHg (ref 32–48)
pCO2 arterial: 38.8 mmHg (ref 32–48)
pH, Arterial: 7.498 — ABNORMAL HIGH (ref 7.35–7.45)
pH, Arterial: 7.533 — ABNORMAL HIGH (ref 7.35–7.45)
pH, Arterial: 7.292 — ABNORMAL LOW (ref 7.35–7.45)
pH, Arterial: 7.45 (ref 7.35–7.45)
pH, Arterial: 7.281 — ABNORMAL LOW (ref 7.35–7.45)
pH, Arterial: 7.414 (ref 7.35–7.45)
pH, Arterial: 7.478 — ABNORMAL HIGH (ref 7.35–7.45)
pO2, Arterial: 433 mmHg — ABNORMAL HIGH (ref 83–108)
pO2, Arterial: 351 mmHg — ABNORMAL HIGH (ref 83–108)
pO2, Arterial: 209 mmHg — ABNORMAL HIGH (ref 83–108)
pO2, Arterial: 282 mmHg — ABNORMAL HIGH (ref 83–108)
pO2, Arterial: 219 mmHg — ABNORMAL HIGH (ref 83–108)
pO2, Arterial: 314 mmHg — ABNORMAL HIGH (ref 83–108)
pO2, Arterial: 363 mmHg — ABNORMAL HIGH (ref 83–108)

## 2023-06-06 LAB — GLUCOSE, CAPILLARY
Glucose-Capillary: 142 mg/dL — ABNORMAL HIGH (ref 70–99)
Glucose-Capillary: 258 mg/dL — ABNORMAL HIGH (ref 70–99)
Glucose-Capillary: 253 mg/dL — ABNORMAL HIGH (ref 70–99)
Glucose-Capillary: 198 mg/dL — ABNORMAL HIGH (ref 70–99)
Glucose-Capillary: 158 mg/dL — ABNORMAL HIGH (ref 70–99)
Glucose-Capillary: 156 mg/dL — ABNORMAL HIGH (ref 70–99)

## 2023-06-06 LAB — ECHOCARDIOGRAM COMPLETE
AR max vel: 2.81 cm2
AV Area VTI: 2.65 cm2
AV Area mean vel: 2.56 cm2
AV Mean grad: 3 mmHg
AV Peak grad: 4.9 mmHg
Ao pk vel: 1.11 m/s
Area-P 1/2: 3.72 cm2
Height: 67.5 in
S' Lateral: 3.4 cm
Weight: 3322.77 oz

## 2023-06-06 LAB — LACTIC ACID, PLASMA
Lactic Acid, Venous: 1.1 mmol/L (ref 0.5–1.9)
Lactic Acid, Venous: 1.3 mmol/L (ref 0.5–1.9)
Lactic Acid, Venous: 1.2 mmol/L (ref 0.5–1.9)
Lactic Acid, Venous: 1.2 mmol/L (ref 0.5–1.9)

## 2023-06-06 LAB — APTT
aPTT: 22 seconds — ABNORMAL LOW (ref 24–36)
aPTT: 25 seconds (ref 24–36)
aPTT: 45 seconds — ABNORMAL HIGH (ref 24–36)
aPTT: 30 seconds (ref 24–36)

## 2023-06-06 LAB — BILIRUBIN, DIRECT
Bilirubin, Direct: 0.1 mg/dL (ref 0.0–0.2)
Bilirubin, Direct: 0.2 mg/dL (ref 0.0–0.2)
Bilirubin, Direct: 0.2 mg/dL (ref 0.0–0.2)
Bilirubin, Direct: 0.1 mg/dL (ref 0.0–0.2)

## 2023-06-06 LAB — CK TOTAL AND CKMB (NOT AT ARMC)
CK, MB: 0.7 ng/mL (ref 0.5–5.0)
CK, MB: 1.2 ng/mL (ref 0.5–5.0)
Total CK: 48 U/L (ref 38–234)
Total CK: 43 U/L (ref 38–234)

## 2023-06-06 LAB — PROTIME-INR
INR: 1.1 (ref 0.8–1.2)
INR: 1.2 (ref 0.8–1.2)
INR: 1.2 (ref 0.8–1.2)
INR: 1.2 (ref 0.8–1.2)
Prothrombin Time: 14.2 seconds (ref 11.4–15.2)
Prothrombin Time: 14.9 seconds (ref 11.4–15.2)
Prothrombin Time: 15 seconds (ref 11.4–15.2)
Prothrombin Time: 15.3 seconds — ABNORMAL HIGH (ref 11.4–15.2)

## 2023-06-06 LAB — TROPONIN I (HIGH SENSITIVITY)
Troponin I (High Sensitivity): 4 ng/L (ref <18)
Troponin I (High Sensitivity): 4 ng/L (ref <18)

## 2023-06-06 LAB — CULTURE, RESPIRATORY W GRAM STAIN

## 2023-06-06 LAB — CULTURE, BLOOD (ROUTINE X 2): Special Requests: ADEQUATE

## 2023-06-06 LAB — CALCIUM, IONIZED: Calcium, Ionized, Serum: 5.5 mg/dL (ref 4.5–5.6)

## 2023-06-06 MED ORDER — LEVOTHYROXINE SODIUM 100 MCG/5ML IV SOLN
20.0000 ug | Freq: Once | INTRAVENOUS | Status: AC
  Administered 2023-06-06: 20 ug via INTRAVENOUS
  Filled 2023-06-06: qty 5

## 2023-06-06 MED ORDER — SODIUM CHLORIDE 0.9 % IV SOLN
10.0000 ug/h | INTRAVENOUS | Status: DC
  Administered 2023-06-06: 20 ug/h via INTRAVENOUS
  Administered 2023-06-06: 10 ug/h via INTRAVENOUS
  Administered 2023-06-07: 20 ug/h via INTRAVENOUS
  Filled 2023-06-06 (×4): qty 10

## 2023-06-06 MED ORDER — INSULIN ASPART 100 UNIT/ML IJ SOLN
10.0000 [IU] | Freq: Once | INTRAMUSCULAR | Status: AC
  Administered 2023-06-06: 10 [IU] via INTRAVENOUS

## 2023-06-06 MED ORDER — DEXTROSE 50 % IV SOLN: 50.0000 mL | Freq: Once | INTRAVENOUS | Status: DC

## 2023-06-06 MED ORDER — SODIUM CHLORIDE 0.9 % IV SOLN
2000.0000 mg | Freq: Once | INTRAVENOUS | Status: AC
  Administered 2023-06-06: 2000 mg via INTRAVENOUS
  Filled 2023-06-06: qty 32

## 2023-06-06 NOTE — Progress Notes (Signed)
Brain death testing at bedside completed with MD, RT and 2 RNs. See MD note for details. Time of death 0830. MD notified family. Kaushal Vannice, [place] Scrape, RN

## 2023-06-06 NOTE — Progress Notes (Signed)
Recruitment maneuvers performed as ordered followed by ABG.

## 2023-06-06 NOTE — Progress Notes (Signed)
Recruitment maneuver performed as ordered with ABG.

## 2023-06-06 NOTE — Progress Notes (Signed)
NAME:  ARTHUR AYDELOTTE, MRN:  [id number], DOB:  11-Mar-1961, LOS: 15 ADMISSION DATE:  Jun 06, 2023, CONSULTATION DATE:  June 06, 2023 REFERRING MD:  Dr. Elpidio Anis, CHIEF COMPLAINT:  Head bleed   History of Present Illness:  Patient is a 62 yo Female w/ pertinent PMH coronary artery vasospasm, DMT2, HTN presents to [facility] ED on 2023-06-06 w/ head bleed.  On June 06, 2023, patient was at work and had syncopal episode falling and hit back of head. Not on any blood thinners at home. EMS called. Found patient HR 160s and was given 6 mg adenosine then given 20 mg cardizem converting back to sinus rhythm.   Upon arrival to [facility] ED, patient hemodynamically stable and afebrile. EKG sinus rhythm. CT head showing hemorrhage in the anterior right frontal lobe, likely contusion, with surrounding edema; Additional SDH and SAH along the right frontal convexity, with 4 mm right-to-left midline shift. NSG consulted recommending SBP goal <150 and Keppra 500 bid. Recommend ICU admission for close neuro monitoring. PCCM consulted.  Pertinent ED labs: ethanol and UDS wnl, trop 9, K 3.1, mag 1.5, glucose 177 CTA chest with no PE; large hiatal hernia; b/l adrenal nodules possible adenomas.  Pertinent  Medical History   Past Medical History:  Diagnosis Date   Allergic rhinitis, cause unspecified    seasonal   Anginal pain (HCC) 2013   Stress   Arthritis    hips, thumb,knees   Diabetes mellitus without complication (HCC)    Essential hypertension, benign 12/12/2011   Joint pain    Multiple drug allergies    Obesity    Wears glasses    Wears partial dentures    upper     Significant Hospital Events: Including procedures, antibiotic start and stop dates in addition to other pertinent events   June 06, 2023 syncopal episode then hit head; ct head with head bleed and midline shift 6/15 episodes of SVT, lethargy 6/17 Worsening mental status and right blown pupil this am. Intubated 619 blood culture 1 of 2 Staph hominis, Staph epidermidis 6/25  herniation event  Interim History / Subjective:  BP swings yesterday. This am now without brainstem reflexes.  Objective   Blood pressure (!) 152/75, pulse 89, temperature 98.8 F (37.1 C), resp. rate 15, height 5' 7.5" (1.715 m), weight 94.2 kg, last menstrual period 03/26/2012, SpO2 95 %.    Vent Mode: PRVC FiO2 (%):  [40 %-100 %] 100 % Set Rate:  [15 bmp] 15 bmp Vt Set:  [500 mL-540 mL] 500 mL PEEP:  [5 cmH20] 5 cmH20 Plateau Pressure:  [14 cmH20-17 cmH20] 15 cmH20   Intake/Output Summary (Last 24 hours) at 06/06/2023 0741 Last data filed at 06/06/2023 0700 Gross per 24 hour  Intake 3475.76 ml  Output 2580 ml  Net 895.76 ml    Filed Weights   06/03/23 0500 06/04/23 0500 06/05/23 0500  Weight: 90.5 kg 91.7 kg 94.2 kg   Physical Exam: Unresponsive No cough/gag/corneal/pupillary/doll's No resp drive Ext warm Scattered rhonci on vent  BMP, CBC ok  Resolved Hospital Problem list   Lactic acidosis  Assessment & Plan:   Large right frontal lobe and mild right anterior temporal lobe hemorrhagic contusions with edema with mass effect/brain compression Left inferior frontal hemorrhagic contusion with edema Small subdural hemorrhage Posterior scalp lac Acute cortical ischemia throughout right PCA and small areas of acute ischemia of the right thalamus and bilateral midbrain Recurrent SVT- now sinus brady on amio, stopped 6/24 Hypertension Coronary vasospasm Intubated for airway protection Centrally-mediated shock Iatrogenic hypernatremia- now  self-correcting slowly  Appears to have progressed to brain death Will do formal testing and update family Continue donor workup, vent support  30 min cc time  Myrla Halsted MD PCCM

## 2023-06-06 NOTE — Procedures (Signed)
Adult Brain Death Determination  Time of Examination: 06/06/23 8:31 AM  No Evidence of /Cause of Reversible CNS Depression  Core temperature must be greater >36 degrees. Last temp: Temp: 98.6 F (37 C) (Note: If unable to achieve normothermia after 12 hours of temperature management, may consider proceeding with Brain Death Evaluation.):    yes  Evidence of severe metabolic perturbations that could potentate CNS depression. Consider glucose, Na, creatinine, PaCO2, SaO2.:    Absent  Evidence of drugs, by history or measurement, that could potentiate central nervous system depression: narcotics, ethanol, benzodiazepines, barbiturates, neuromuscular blockade.:     Absent  Absence of Cortical Function  GCS = 3:    yes  Absence of Brain Stem Reflexes and Responses  Pupils light-fixed    yes  Corneal reflexes:    Absent  Response to upper and lower airway stimulation, such as pharyngeal and endotracheal suctioning.:    Absent  Ocular response to head turning (eye movement).    Absent  Absence of Spontaneous Respirations  (Apnea test performed per Brain Death Policy. If not met due to hemodynamic/ventilatory instability, then perform EEG, TCD, or cerebral blow flow studies.)  1.   Spontaneous Respirations   Absent  2.   PaCO2 at start of apnea test:  37  3.   PaCO2 at end of apnea test:  65  4.   CO2 rise of 20 or greater from baseline:   yes  Document Confirmatory Test Utilized: (Optional) Nuclear cerebral flow, cerebral angiography (CT/MR angio), transcranial Doppler ultrasound, EEG, SSEP (record results).  Test results (if available):  CT with massive IPH and edema  Patient pronounced dead by neurological criteria at 8:30AM on 06/06/23.  Lorin Glass, MD 06/06/2023 8:31 AM

## 2023-06-07 ENCOUNTER — Encounter (HOSPITAL_COMMUNITY): Payer: 59 | Admitting: Anesthesiology

## 2023-06-07 ENCOUNTER — Encounter (HOSPITAL_COMMUNITY): Admission: EM | Disposition: E | Payer: Self-pay | Source: Home / Self Care | Attending: Internal Medicine

## 2023-06-07 DIAGNOSIS — G9382 Brain death: Secondary | ICD-10-CM | POA: Diagnosis not present

## 2023-06-07 DIAGNOSIS — I629 Nontraumatic intracranial hemorrhage, unspecified: Secondary | ICD-10-CM | POA: Diagnosis not present

## 2023-06-07 HISTORY — PX: ORGAN PROCUREMENT: SHX5270

## 2023-06-07 LAB — PROTIME-INR
INR: 1.1 (ref 0.8–1.2)
INR: 1.1 (ref 0.8–1.2)
INR: 1.1 (ref 0.8–1.2)
Prothrombin Time: 14.8 seconds (ref 11.4–15.2)
Prothrombin Time: 14.6 seconds (ref 11.4–15.2)
Prothrombin Time: 14.7 seconds (ref 11.4–15.2)

## 2023-06-07 LAB — COMPREHENSIVE METABOLIC PANEL
ALT: 56 U/L — ABNORMAL HIGH (ref 0–44)
ALT: 51 U/L — ABNORMAL HIGH (ref 0–44)
ALT: 54 U/L — ABNORMAL HIGH (ref 0–44)
AST: 20 U/L (ref 15–41)
AST: 20 U/L (ref 15–41)
AST: 23 U/L (ref 15–41)
Albumin: 2.4 g/dL — ABNORMAL LOW (ref 3.5–5.0)
Albumin: 2.3 g/dL — ABNORMAL LOW (ref 3.5–5.0)
Albumin: 2.3 g/dL — ABNORMAL LOW (ref 3.5–5.0)
Alkaline Phosphatase: 48 U/L (ref 38–126)
Alkaline Phosphatase: 53 U/L (ref 38–126)
Alkaline Phosphatase: 50 U/L (ref 38–126)
Anion gap: 8 (ref 5–15)
Anion gap: 8 (ref 5–15)
Anion gap: 9 (ref 5–15)
BUN: 16 mg/dL (ref 8–23)
BUN: 12 mg/dL (ref 8–23)
BUN: 12 mg/dL (ref 8–23)
CO2: 24 mmol/L (ref 22–32)
CO2: 22 mmol/L (ref 22–32)
CO2: 23 mmol/L (ref 22–32)
Calcium: 8.6 mg/dL — ABNORMAL LOW (ref 8.9–10.3)
Calcium: 8.5 mg/dL — ABNORMAL LOW (ref 8.9–10.3)
Calcium: 8.7 mg/dL — ABNORMAL LOW (ref 8.9–10.3)
Chloride: 114 mmol/L — ABNORMAL HIGH (ref 98–111)
Chloride: 114 mmol/L — ABNORMAL HIGH (ref 98–111)
Chloride: 115 mmol/L — ABNORMAL HIGH (ref 98–111)
Creatinine, Ser: 0.62 mg/dL (ref 0.44–1.00)
Creatinine, Ser: 0.67 mg/dL (ref 0.44–1.00)
Creatinine, Ser: 0.69 mg/dL (ref 0.44–1.00)
GFR, Estimated: >60 mL/min (ref >60)
GFR, Estimated: >60 mL/min (ref >60)
GFR, Estimated: >60 mL/min (ref >60)
Glucose, Bld: 204 mg/dL — ABNORMAL HIGH (ref 70–99)
Glucose, Bld: 246 mg/dL — ABNORMAL HIGH (ref 70–99)
Glucose, Bld: 267 mg/dL — ABNORMAL HIGH (ref 70–99)
Potassium: 3.5 mmol/L (ref 3.5–5.1)
Potassium: 3.4 mmol/L — ABNORMAL LOW (ref 3.5–5.1)
Potassium: 3.5 mmol/L (ref 3.5–5.1)
Sodium: 146 mmol/L — ABNORMAL HIGH (ref 135–145)
Sodium: 144 mmol/L (ref 135–145)
Sodium: 147 mmol/L — ABNORMAL HIGH (ref 135–145)
Total Bilirubin: 0.7 mg/dL (ref 0.3–1.2)
Total Bilirubin: 0.6 mg/dL (ref 0.3–1.2)
Total Bilirubin: 0.4 mg/dL (ref 0.3–1.2)
Total Protein: 5.9 g/dL — ABNORMAL LOW (ref 6.5–8.1)
Total Protein: 5.9 g/dL — ABNORMAL LOW (ref 6.5–8.1)
Total Protein: 5.9 g/dL — ABNORMAL LOW (ref 6.5–8.1)

## 2023-06-07 LAB — POCT I-STAT 7, (LYTES, BLD GAS, ICA,H+H)
Acid-Base Excess: 2 mmol/L (ref 0.0–2.0)
Acid-Base Excess: 0 mmol/L (ref 0.0–2.0)
Acid-base deficit: 1 mmol/L (ref 0.0–2.0)
Bicarbonate: 25.7 mmol/L (ref 20.0–28.0)
Bicarbonate: 24.3 mmol/L (ref 20.0–28.0)
Bicarbonate: 24 mmol/L (ref 20.0–28.0)
Calcium, Ion: 1.2 mmol/L (ref 1.15–1.40)
Calcium, Ion: 1.23 mmol/L (ref 1.15–1.40)
Calcium, Ion: 1.25 mmol/L (ref 1.15–1.40)
HCT: 29 % — ABNORMAL LOW (ref 36.0–46.0)
HCT: 30 % — ABNORMAL LOW (ref 36.0–46.0)
HCT: 33 % — ABNORMAL LOW (ref 36.0–46.0)
Hemoglobin: 9.9 g/dL — ABNORMAL LOW (ref 12.0–15.0)
Hemoglobin: 10.2 g/dL — ABNORMAL LOW (ref 12.0–15.0)
Hemoglobin: 11.2 g/dL — ABNORMAL LOW (ref 12.0–15.0)
O2 Saturation: 100 %
O2 Saturation: 100 %
O2 Saturation: 100 %
Patient temperature: 36.6
Patient temperature: 36.7
Patient temperature: 37.4
Potassium: 3.7 mmol/L (ref 3.5–5.1)
Potassium: 3.5 mmol/L (ref 3.5–5.1)
Potassium: 3.4 mmol/L — ABNORMAL LOW (ref 3.5–5.1)
Sodium: 149 mmol/L — ABNORMAL HIGH (ref 135–145)
Sodium: 147 mmol/L — ABNORMAL HIGH (ref 135–145)
Sodium: 148 mmol/L — ABNORMAL HIGH (ref 135–145)
TCO2: 27 mmol/L (ref 22–32)
TCO2: 25 mmol/L (ref 22–32)
TCO2: 25 mmol/L (ref 22–32)
pCO2 arterial: 35 mmHg (ref 32–48)
pCO2 arterial: 37 mmHg (ref 32–48)
pCO2 arterial: 41.5 mmHg (ref 32–48)
pH, Arterial: 7.473 — ABNORMAL HIGH (ref 7.35–7.45)
pH, Arterial: 7.423 (ref 7.35–7.45)
pH, Arterial: 7.371 (ref 7.35–7.45)
pO2, Arterial: 328 mmHg — ABNORMAL HIGH (ref 83–108)
pO2, Arterial: 334 mmHg — ABNORMAL HIGH (ref 83–108)
pO2, Arterial: 305 mmHg — ABNORMAL HIGH (ref 83–108)

## 2023-06-07 LAB — URINALYSIS, W/ REFLEX TO CULTURE (INFECTION SUSPECTED)
Bacteria, UA: NONE SEEN
Bilirubin Urine: NEGATIVE
Glucose, UA: NEGATIVE mg/dL
Hgb urine dipstick: NEGATIVE
Ketones, ur: NEGATIVE mg/dL
Leukocytes,Ua: NEGATIVE
Nitrite: NEGATIVE
Protein, ur: NEGATIVE mg/dL
Specific Gravity, Urine: 1.02 (ref 1.005–1.030)
pH: 6.5 (ref 5.0–8.0)

## 2023-06-07 LAB — CBC
HCT: 32.1 % — ABNORMAL LOW (ref 36.0–46.0)
HCT: 31.4 % — ABNORMAL LOW (ref 36.0–46.0)
HCT: 33.1 % — ABNORMAL LOW (ref 36.0–46.0)
Hemoglobin: 9.6 g/dL — ABNORMAL LOW (ref 12.0–15.0)
Hemoglobin: 9.5 g/dL — ABNORMAL LOW (ref 12.0–15.0)
Hemoglobin: 9.7 g/dL — ABNORMAL LOW (ref 12.0–15.0)
MCH: 23.8 pg — ABNORMAL LOW (ref 26.0–34.0)
MCH: 24.2 pg — ABNORMAL LOW (ref 26.0–34.0)
MCH: 23.7 pg — ABNORMAL LOW (ref 26.0–34.0)
MCHC: 29.9 g/dL — ABNORMAL LOW (ref 30.0–36.0)
MCHC: 30.3 g/dL (ref 30.0–36.0)
MCHC: 29.3 g/dL — ABNORMAL LOW (ref 30.0–36.0)
MCV: 79.5 fL — ABNORMAL LOW (ref 80.0–100.0)
MCV: 79.9 fL — ABNORMAL LOW (ref 80.0–100.0)
MCV: 80.7 fL (ref 80.0–100.0)
Platelets: 227 10*3/uL (ref 150–400)
Platelets: 236 10*3/uL (ref 150–400)
Platelets: 278 10*3/uL (ref 150–400)
RBC: 4.04 MIL/uL (ref 3.87–5.11)
RBC: 3.93 MIL/uL (ref 3.87–5.11)
RBC: 4.1 MIL/uL (ref 3.87–5.11)
RDW: 21.7 % — ABNORMAL HIGH (ref 11.5–15.5)
RDW: 21.7 % — ABNORMAL HIGH (ref 11.5–15.5)
RDW: 22 % — ABNORMAL HIGH (ref 11.5–15.5)
WBC: 10.8 10*3/uL — ABNORMAL HIGH (ref 4.0–10.5)
WBC: 12.9 10*3/uL — ABNORMAL HIGH (ref 4.0–10.5)
WBC: 14.2 10*3/uL — ABNORMAL HIGH (ref 4.0–10.5)
nRBC: 0 % (ref 0.0–0.2)
nRBC: 0 % (ref 0.0–0.2)
nRBC: 0 % (ref 0.0–0.2)

## 2023-06-07 LAB — CK TOTAL AND CKMB (NOT AT ARMC)
CK, MB: 1.2 ng/mL (ref 0.5–5.0)
CK, MB: 1.5 ng/mL (ref 0.5–5.0)
CK, MB: 1.2 ng/mL (ref 0.5–5.0)
Total CK: 46 U/L (ref 38–234)
Total CK: 46 U/L (ref 38–234)
Total CK: 43 U/L (ref 38–234)

## 2023-06-07 LAB — BILIRUBIN, DIRECT
Bilirubin, Direct: 0.1 mg/dL (ref 0.0–0.2)
Bilirubin, Direct: 0.1 mg/dL (ref 0.0–0.2)
Bilirubin, Direct: 0.1 mg/dL (ref 0.0–0.2)

## 2023-06-07 LAB — URINE CULTURE: Culture: 10000 — AB

## 2023-06-07 LAB — LACTIC ACID, PLASMA
Lactic Acid, Venous: 1.2 mmol/L (ref 0.5–1.9)
Lactic Acid, Venous: 1.5 mmol/L (ref 0.5–1.9)
Lactic Acid, Venous: 1.6 mmol/L (ref 0.5–1.9)

## 2023-06-07 LAB — GLUCOSE, CAPILLARY
Glucose-Capillary: 193 mg/dL — ABNORMAL HIGH (ref 70–99)
Glucose-Capillary: 185 mg/dL — ABNORMAL HIGH (ref 70–99)
Glucose-Capillary: 260 mg/dL — ABNORMAL HIGH (ref 70–99)
Glucose-Capillary: 243 mg/dL — ABNORMAL HIGH (ref 70–99)
Glucose-Capillary: 264 mg/dL — ABNORMAL HIGH (ref 70–99)

## 2023-06-07 LAB — APTT
aPTT: 33 seconds (ref 24–36)
aPTT: 37 seconds — ABNORMAL HIGH (ref 24–36)
aPTT: 37 seconds — ABNORMAL HIGH (ref 24–36)

## 2023-06-07 LAB — CULTURE, RESPIRATORY W GRAM STAIN

## 2023-06-07 LAB — CULTURE, BLOOD (ROUTINE X 2): Culture: NO GROWTH

## 2023-06-07 SURGERY — SURGICAL PROCUREMENT, ORGAN
Anesthesia: General

## 2023-06-07 MED ORDER — PHENYLEPHRINE 80 MCG/ML (10ML) SYRINGE FOR IV PUSH (FOR BLOOD PRESSURE SUPPORT)
PREFILLED_SYRINGE | INTRAVENOUS | Status: DC | PRN
  Administered 2023-06-07: 120 ug via INTRAVENOUS
  Administered 2023-06-07: 160 ug via INTRAVENOUS
  Administered 2023-06-07: 120 ug via INTRAVENOUS
  Administered 2023-06-07: 160 ug via INTRAVENOUS

## 2023-06-07 MED ORDER — SODIUM CHLORIDE 0.9 % IV SOLN
2.0000 g | Freq: Three times a day (TID) | INTRAVENOUS | Status: DC
  Administered 2023-06-07: 2 g via INTRAVENOUS
  Filled 2023-06-07: qty 12.5

## 2023-06-07 MED ORDER — EPHEDRINE SULFATE-NACL 50-0.9 MG/10ML-% IV SOSY
PREFILLED_SYRINGE | INTRAVENOUS | Status: DC | PRN
  Administered 2023-06-07: 10 mg via INTRAVENOUS

## 2023-06-07 MED ORDER — LACTATED RINGERS IV BOLUS
500.0000 mL | Freq: Once | INTRAVENOUS | Status: AC
  Administered 2023-06-07: 500 mL via INTRAVENOUS

## 2023-06-07 MED ORDER — 0.9 % SODIUM CHLORIDE (POUR BTL) OPTIME
TOPICAL | Status: DC | PRN
  Administered 2023-06-07: 5000 mL

## 2023-06-07 MED ORDER — ROCURONIUM BROMIDE 10 MG/ML (PF) SYRINGE
PREFILLED_SYRINGE | INTRAVENOUS | Status: AC
  Filled 2023-06-07: qty 20

## 2023-06-07 MED ORDER — MIDAZOLAM HCL 2 MG/2ML IJ SOLN
INTRAMUSCULAR | Status: AC
  Filled 2023-06-07: qty 2

## 2023-06-07 MED ORDER — ROCURONIUM BROMIDE 10 MG/ML (PF) SYRINGE
PREFILLED_SYRINGE | INTRAVENOUS | Status: AC
  Filled 2023-06-07: qty 10

## 2023-06-07 MED ORDER — HEPARIN SODIUM (PORCINE) 1000 UNIT/ML IJ SOLN
INTRAMUSCULAR | Status: DC | PRN
  Administered 2023-06-07: 28000 [IU] via INTRAVENOUS

## 2023-06-07 MED ORDER — LACTATED RINGERS IV SOLN: INTRAVENOUS | Status: DC | PRN

## 2023-06-07 MED ORDER — MIDAZOLAM HCL 2 MG/2ML IJ SOLN
INTRAMUSCULAR | Status: DC | PRN
  Administered 2023-06-07: 2 mg via INTRAVENOUS

## 2023-06-07 MED ORDER — ROCURONIUM BROMIDE 10 MG/ML (PF) SYRINGE
PREFILLED_SYRINGE | INTRAVENOUS | Status: DC | PRN
  Administered 2023-06-07 (×3): 100 mg via INTRAVENOUS

## 2023-06-07 SURGICAL SUPPLY — 91 items
APPLIER CLIP 11 MED OPEN (CLIP) ×1
APR CLP MED 11 20 MLT OPN (CLIP) ×1
BAG COUNTER SPONGE SURGICOUNT (BAG) ×1 IMPLANT
BAG SPNG CNTER NS LX DISP (BAG) ×1
BLADE CLIPPER SURG (BLADE) IMPLANT
BLADE SAW STERNAL (BLADE) ×1 IMPLANT
BLADE SURG 10 STRL SS (BLADE) IMPLANT
CLIP APPLIE 11 MED OPEN (CLIP) ×1 IMPLANT
CLIP TI MEDIUM 24 (CLIP) IMPLANT
CLIP TI WIDE RED SMALL 24 (CLIP) IMPLANT
CNTNR URN SCR LID CUP LEK RST (MISCELLANEOUS) ×1 IMPLANT
CONT SPEC 4OZ STRL OR WHT (MISCELLANEOUS) ×2
COVER BACK TABLE 60X90IN (DRAPES) IMPLANT
COVER MAYO STAND STRL (DRAPES) IMPLANT
COVER SURGICAL LIGHT HANDLE (MISCELLANEOUS) ×1 IMPLANT
DRAPE HALF SHEET 40X57 (DRAPES) IMPLANT
DRAPE SLUSH MACHINE 52X66 (DRAPES) ×1 IMPLANT
DRSG COVADERM 4X10 (GAUZE/BANDAGES/DRESSINGS) IMPLANT
DRSG TELFA 3X8 NADH STRL (GAUZE/BANDAGES/DRESSINGS) ×1 IMPLANT
DURAPREP 26ML APPLICATOR (WOUND CARE) IMPLANT
ELECT BLADE 6.5 EXT (BLADE) IMPLANT
ELECT REM PT RETURN 9FT ADLT (ELECTROSURGICAL) ×2
ELECTRODE REM PT RTRN 9FT ADLT (ELECTROSURGICAL) ×2 IMPLANT
GAUZE 4X4 16PLY ~~LOC~~+RFID DBL (SPONGE) IMPLANT
GLOVE BIO SURGEON STRL SZ7 (GLOVE) IMPLANT
GLOVE BIO SURGEON STRL SZ7.5 (GLOVE) IMPLANT
GLOVE BIO SURGEON STRL SZ8 (GLOVE) IMPLANT
GLOVE BIO SURGEON STRL SZ8.5 (GLOVE) IMPLANT
GLOVE BIOGEL PI IND STRL 6.5 (GLOVE) IMPLANT
GLOVE BIOGEL PI IND STRL 7.0 (GLOVE) IMPLANT
GLOVE BIOGEL PI IND STRL 7.5 (GLOVE) IMPLANT
GLOVE BIOGEL PI IND STRL 8 (GLOVE) IMPLANT
GLOVE BIOGEL PI IND STRL 8.5 (GLOVE) IMPLANT
GLOVE SURG SS PI 6.0 STRL IVOR (GLOVE) IMPLANT
GLOVE SURG SS PI 6.5 STRL IVOR (GLOVE) IMPLANT
GLOVE SURG SS PI 7.0 STRL IVOR (GLOVE) IMPLANT
GLOVE SURG SS PI 7.5 STRL IVOR (GLOVE) IMPLANT
GLOVE SURG SS PI 8.0 STRL IVOR (GLOVE) IMPLANT
GOWN STRL REUS W/ TWL LRG LVL3 (GOWN DISPOSABLE) ×4 IMPLANT
GOWN STRL REUS W/ TWL XL LVL3 (GOWN DISPOSABLE) ×2 IMPLANT
GOWN STRL REUS W/TWL LRG LVL3 (GOWN DISPOSABLE) ×4
GOWN STRL REUS W/TWL XL LVL3 (GOWN DISPOSABLE) ×2
HANDLE SUCTION POOLE (INSTRUMENTS) IMPLANT
KIT POST MORTEM ADULT 36X90 (BAG) ×1 IMPLANT
KIT TURNOVER KIT B (KITS) ×1 IMPLANT
LOOP VASCLR MAXI BLUE 18IN ST (MISCELLANEOUS) IMPLANT
LOOP VASCULAR MAXI 18 BLUE (MISCELLANEOUS)
LOOP VASCULAR MINI 18 RED (MISCELLANEOUS)
LOOPS VASCLR MAXI BLUE 18IN ST (MISCELLANEOUS) IMPLANT
MANIFOLD NEPTUNE II (INSTRUMENTS) ×1 IMPLANT
NDL BIOPSY 14X6 SOFT TISS (NEEDLE) IMPLANT
NEEDLE BIOPSY 14X6 SOFT TISS (NEEDLE) ×1 IMPLANT
NS IRRIG 1000ML POUR BTL (IV SOLUTION) IMPLANT
PACK AORTA (CUSTOM PROCEDURE TRAY) ×1 IMPLANT
PAD ARMBOARD 7.5X6 YLW CONV (MISCELLANEOUS) ×2 IMPLANT
PENCIL BUTTON HOLSTER BLD 10FT (ELECTRODE) ×1 IMPLANT
SOL PREP POV-IOD 4OZ 10% (MISCELLANEOUS) ×2 IMPLANT
SPONGE INTESTINAL PEANUT (DISPOSABLE) IMPLANT
SPONGE T-LAP 18X18 ~~LOC~~+RFID (SPONGE) IMPLANT
STAPLER VISISTAT 35W (STAPLE) ×1 IMPLANT
SUCTION POOLE HANDLE (INSTRUMENTS) ×1
SUT BONE WAX W31G (SUTURE) IMPLANT
SUT ETHIBOND 5 LR DA (SUTURE) IMPLANT
SUT ETHILON 1 LR 30 (SUTURE) ×2 IMPLANT
SUT ETHILON 2 LR (SUTURE) IMPLANT
SUT PROLENE 3 0 RB 1 (SUTURE) IMPLANT
SUT PROLENE 3 0 SH 1 (SUTURE) IMPLANT
SUT PROLENE 4 0 RB 1 (SUTURE)
SUT PROLENE 4-0 RB1 .5 CRCL 36 (SUTURE) IMPLANT
SUT PROLENE 5 0 C 1 24 (SUTURE) IMPLANT
SUT PROLENE 6 0 BV (SUTURE) IMPLANT
SUT SILK 0 TIES 10X30 (SUTURE) IMPLANT
SUT SILK 1 SH (SUTURE) IMPLANT
SUT SILK 1 TIES 10X30 (SUTURE) IMPLANT
SUT SILK 2 0 (SUTURE)
SUT SILK 2 0 SH (SUTURE) IMPLANT
SUT SILK 2 0 SH CR/8 (SUTURE) IMPLANT
SUT SILK 2 0 TIES 10X30 (SUTURE) IMPLANT
SUT SILK 2-0 18XBRD TIE 12 (SUTURE) IMPLANT
SUT SILK 3 0 SH CR/8 (SUTURE) IMPLANT
SUT SILK 3 0 TIES 10X30 (SUTURE) IMPLANT
SWAB COLLECTION DEVICE MRSA (MISCELLANEOUS) IMPLANT
SWAB CULTURE ESWAB REG 1ML (MISCELLANEOUS) IMPLANT
SYR 50ML LL SCALE MARK (SYRINGE) IMPLANT
SYRINGE TOOMEY DISP (SYRINGE) IMPLANT
TAPE UMBILICAL 1/8 X36 TWILL (MISCELLANEOUS) IMPLANT
TUBE CONNECTING 12X1/4 (SUCTIONS) ×1 IMPLANT
VASCULAR TIE MAXI BLUE 18IN ST (MISCELLANEOUS)
VASCULAR TIE MINI RED 18IN STL (MISCELLANEOUS) IMPLANT
WATER STERILE IRR 1000ML POUR (IV SOLUTION) IMPLANT
YANKAUER SUCT BULB TIP NO VENT (SUCTIONS) ×1 IMPLANT

## 2023-06-07 NOTE — OR Nursing (Signed)
Right kidney out 2313

## 2023-06-07 NOTE — Transfer of Care (Addendum)
Immediate Anesthesia Transfer of Care Note  Patient: Lindsay [name]  Procedure(s) Performed: ORGAN PROCUREMENT-LIVER, KIDNEYS  Patient Location: OR 12 organ procurement  Anesthesia Type:General  Level of Consciousness: Patient remains intubated per anesthesia plan  Airway & Oxygen Therapy:  organ procurement  Post-op Assessment:  organ procurement  Post vital signs: Reviewed; organ procurement  Last Vitals:  Vitals Value Taken Time  BP    Temp    Pulse    Resp    SpO2      Last Pain:  Vitals:   06/06/23 0800  TempSrc: Esophageal  PainSc:       Patients Stated Pain Goal: 2 (05/26/23 1600)  Complications: No notable events documented.  Cross clamp 2212.

## 2023-06-07 NOTE — OR Nursing (Signed)
Cross Clamp Aorta - 22:12

## 2023-06-07 NOTE — Anesthesia Preprocedure Evaluation (Signed)
Anesthesia Evaluation  Patient identified by MRN, date of birth, ID band Patient unresponsive    Reviewed: Patient's Chart, lab work & pertinent test results, Unable to perform ROS - Chart review only  Airway Mallampati: Intubated       Dental   Pulmonary former smoker   breath sounds clear to auscultation       Cardiovascular hypertension, + angina  + CAD and + Past MI   Rhythm:regular Rate:Normal     Neuro/Psych SAH Brain dead  Neuromuscular disease CVA    GI/Hepatic   Endo/Other  diabetes, Type 2    Renal/GU      Musculoskeletal  (+) Arthritis ,    Abdominal   Peds  Hematology   Anesthesia Other Findings   Reproductive/Obstetrics                             Anesthesia Physical Anesthesia Plan  ASA: 6  Anesthesia Plan: General   Post-op Pain Management:    Induction: Intravenous  PONV Risk Score and Plan: 3 and Treatment may vary due to age or medical condition  Airway Management Planned: Oral ETT  Additional Equipment:   Intra-op Plan:   Post-operative Plan: Post-operative intubation/ventilation  Informed Consent: I have reviewed the patients History and Physical, chart, labs and discussed the procedure including the risks, benefits and alternatives for the proposed anesthesia with the patient or authorized representative who has indicated his/her understanding and acceptance.       Plan Discussed with: CRNA, Anesthesiologist and Surgeon  Anesthesia Plan Comments:        Anesthesia Quick Evaluation

## 2023-06-07 NOTE — Progress Notes (Signed)
Liver out at 2247

## 2023-06-07 NOTE — OR Nursing (Signed)
Left kidney out 2316

## 2023-06-07 NOTE — Progress Notes (Signed)
Recruitment maneuver performed as ordered followed by ABG.

## 2023-06-07 NOTE — Progress Notes (Signed)
NAME:  Lindsay [name], MRN:  [id number], DOB:  May 15, 1961, LOS: 16 ADMISSION DATE:  06/09/2023, CONSULTATION DATE:  6/11 REFERRING MD:  Dr. Elpidio Anis, CHIEF COMPLAINT:  Head bleed   History of Present Illness:  Patient is a 62 yo Female w/ pertinent PMH coronary artery vasospasm, DMT2, HTN presents to [facility] ED on 6/11 w/ head bleed.  On 6/11, patient was at work and had syncopal episode falling and hit back of head. Not on any blood thinners at home. EMS called. Found patient HR 160s and was given 6 mg adenosine then given 20 mg cardizem converting back to sinus rhythm.   Upon arrival to [facility] ED, patient hemodynamically stable and afebrile. EKG sinus rhythm. CT head showing hemorrhage in the anterior right frontal lobe, likely contusion, with surrounding edema; Additional SDH and SAH along the right frontal convexity, with 4 mm right-to-left midline shift. NSG consulted recommending SBP goal <150 and Keppra 500 bid. Recommend ICU admission for close neuro monitoring. PCCM consulted.  Pertinent ED labs: ethanol and UDS wnl, trop 9, K 3.1, mag 1.5, glucose 177 CTA chest with no PE; large hiatal hernia; b/l adrenal nodules possible adenomas.  Pertinent  Medical History   Past Medical History:  Diagnosis Date   Allergic rhinitis, cause unspecified    seasonal   Anginal pain (HCC) 2013   Stress   Arthritis    hips, thumb,knees   Diabetes mellitus without complication (HCC)    Essential hypertension, benign 12/12/2011   Joint pain    Multiple drug allergies    Obesity    Wears glasses    Wears partial dentures    upper     Significant Hospital Events: Including procedures, antibiotic start and stop dates in addition to other pertinent events   6/11 syncopal episode then hit head; ct head with head bleed and midline shift 6/15 episodes of SVT, lethargy 6/17 Worsening mental status and right blown pupil this am. 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  Objective   Blood pressure 120/62, pulse 70, temperature 97.9 F (36.6 C), resp. rate 15, height 5' 7.5" (1.715 m), weight 94.2 kg, last menstrual period 03/26/2012, SpO2 96 %.    Vent Mode: PRVC FiO2 (%):  [40 %-100 %] 40 % Set Rate:  [15 bmp] 15 bmp Vt Set:  [500 mL] 500 mL PEEP:  [5 cmH20] 5 cmH20 Plateau Pressure:  [15 cmH20-17 cmH20] 16 cmH20   Intake/Output Summary (Last 24 hours) at 2023/06/27 4098 Last data filed at 06-27-2023 0600 Gross per 24 hour  Intake 5417.79 ml  Output 800 ml  Net 4617.79 ml    Filed Weights   06/04/23 0500 06/05/23 0500 June 26, 2023 0830  Weight: 91.7 kg 94.2 kg 94.2 kg   Physical Exam: Unresponsive +angioedema stable No cough/gag/corneal/pupillary/doll's No resp drive Ext warm Scattered rhonci on vent, stable Ext warm Slight anasarca  BMP, LFTs okay, K a little low but on standing repletion  Slight WBC bump with steroids BAL neg so far  Resolved Hospital Problem list   Lactic acidosis  Assessment & Plan:   Large right frontal lobe and mild right anterior temporal lobe hemorrhagic contusions with edema with mass effect/brain compression Left inferior frontal hemorrhagic contusion with edema Small subdural hemorrhage Posterior scalp lac Acute cortical ischemia throughout right PCA and small areas of acute ischemia of the right thalamus and bilateral midbrain Recurrent  SVT- now sinus brady on amio, stopped 6/24 Hypertension Coronary vasospasm Intubated for airway protection Centrally-mediated shock Iatrogenic hypernatremia- now self-correcting slowly  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

## 2023-06-08 ENCOUNTER — Encounter (HOSPITAL_COMMUNITY): Payer: Self-pay

## 2023-06-08 ENCOUNTER — Other Ambulatory Visit: Payer: Self-pay

## 2023-06-08 LAB — CALCIUM, IONIZED
Calcium, Ionized, Serum: 5.3 mg/dL (ref 4.5–5.6)
Calcium, Ionized, Serum: 5.4 mg/dL (ref 4.5–5.6)
Calcium, Ionized, Serum: 5 mg/dL (ref 4.5–5.6)
Calcium, Ionized, Serum: 5.1 mg/dL (ref 4.5–5.6)

## 2023-06-08 LAB — CULTURE, BLOOD (ROUTINE X 2): Culture: NO GROWTH

## 2023-06-08 LAB — URINE CULTURE

## 2023-06-09 LAB — CALCIUM, IONIZED
Calcium, Ionized, Serum: 5.3 mg/dL (ref 4.5–5.6)
Calcium, Ionized, Serum: 5.3 mg/dL (ref 4.5–5.6)
Calcium, Ionized, Serum: 5.2 mg/dL (ref 4.5–5.6)
Calcium, Ionized, Serum: 5.3 mg/dL (ref 4.5–5.6)

## 2023-06-09 LAB — CULTURE, BLOOD (ROUTINE X 2)

## 2023-06-11 LAB — SURGICAL PATHOLOGY

## 2023-06-11 NOTE — Death Summary Note (Signed)
DEATH SUMMARY   Patient Details  Name: TARISHA HERBST MRN: [id number] DOB: Jan 18, 1961  Admission/Discharge Information   Admit Date:  Jun 01, 2023  Date of Death: Date of Death: 16-Jun-2023  Time of Death: Time of Death: 0830  Length of Stay: 07-07-23  Referring Physician: Irena Reichmann, DO   Diagnoses  Syncope and traumatic brain injury Traumatic cerebral edema with brain compression and herniation  Brief Hospital Course (including significant findings, care, treatment, and services provided and events leading to death)  Patient is a 62 yo Female w/ pertinent PMH coronary artery vasospasm, DMT2, HTN presents to [facility] ED on 06-01-2023 w/ head bleed.   On Jun 01, 2023, patient was at work and had syncopal episode falling and hit back of head. Not on any blood thinners at home. EMS called. Found patient HR 160s and was given 6 mg adenosine then given 20 mg cardizem converting back to sinus rhythm.    Upon arrival to [facility] ED, patient hemodynamically stable and afebrile. EKG sinus rhythm. CT head showing hemorrhage in the anterior right frontal lobe, likely contusion, with surrounding edema; Additional SDH and SAH along the right frontal convexity, with 4 mm right-to-left midline shift. NSG consulted recommending SBP goal <150 and Keppra 500 bid. Recommend ICU admission for close neuro monitoring. PCCM consulted.   Pertinent ED labs: ethanol and UDS wnl, trop 9, K 3.1, mag 1.5, glucose 177 CTA chest with no PE; large hiatal hernia; b/l adrenal nodules possible adenomas.  Progressive lethargy and mental status during first 6 days in hospital progressing to frank respiratory failure and coma.  Despite appropriate medical interventions to lower ICP her intracranial injury progressed to herniation and death.  Pertinent Labs and Studies  Significant Diagnostic Studies ECHOCARDIOGRAM COMPLETE  Result Date: 06-16-2023    ECHOCARDIOGRAM REPORT   Patient Name:   ALEISHA LONGWELL Date of Exam: 06/16/23 Medical Rec #:   [id number]     Height:       67.5 in Accession #:    [id number]    Weight:       207.7 lb Date of Birth:  12-Oct-1961     BSA:          2.066 m Patient Age:    62 years      BP:           115/63 mmHg Patient Gender: F             HR:           65 bpm. Exam Location:  Inpatient Procedure: 2D Echo, Cardiac Doppler and Color Doppler Indications:    Donor of organ or tissue [[id number]]                  Patient has prior history of Echocardiogram examinations, most                 recent 05/23/2023. Previous Myocardial Infarction,                 Signs/Symptoms:Fever and Dyspnea; Risk Factors:Diabetes and                 Hypertension.                  Traumatic right-sided intracerebral hemorrhage with loss of                 consciousness (HCC).  Sonographer:    Dondra Prader RVT RCS Referring Phys: Levon Hedger, C IMPRESSIONS  1. Left ventricular ejection fraction,  by estimation, is 55 to 60%. The left ventricle has normal function. The left ventricle has no regional wall motion abnormalities. Left ventricular diastolic parameters were normal.  2. Right ventricular systolic function is normal. The right ventricular size is normal.  3. The mitral valve is grossly normal. Trivial mitral valve regurgitation. No evidence of mitral stenosis.  4. The aortic valve is tricuspid. Aortic valve regurgitation is not visualized. No aortic stenosis is present. Comparison(s): No significant change from prior study. FINDINGS  Left Ventricle: Left ventricular ejection fraction, by estimation, is 55 to 60%. The left ventricle has normal function. The left ventricle has no regional wall motion abnormalities. The left ventricular internal cavity size was normal in size. There is  no left ventricular hypertrophy. Left ventricular diastolic parameters were normal. Right Ventricle: The right ventricular size is normal. No increase in right ventricular wall thickness. Right ventricular systolic function is normal. Left Atrium: Left atrial size was  normal in size. Right Atrium: Right atrial size was normal in size. Pericardium: There is no evidence of pericardial effusion. Mitral Valve: The mitral valve is grossly normal. Trivial mitral valve regurgitation. No evidence of mitral valve stenosis. Tricuspid Valve: The tricuspid valve is grossly normal. Tricuspid valve regurgitation is trivial. No evidence of tricuspid stenosis. Aortic Valve: The aortic valve is tricuspid. Aortic valve regurgitation is not visualized. No aortic stenosis is present. Aortic valve mean gradient measures 3.0 mmHg. Aortic valve peak gradient measures 4.9 mmHg. Aortic valve area, by VTI measures 2.65 cm. Pulmonic Valve: The pulmonic valve was grossly normal. Pulmonic valve regurgitation is not visualized. No evidence of pulmonic stenosis. Aorta: The aortic root is normal in size and structure. Venous: The inferior vena cava was not well visualized. IAS/Shunts: The atrial septum is grossly normal.  LEFT VENTRICLE PLAX 2D LVIDd:         4.90 cm   Diastology LVIDs:         3.40 cm   LV e' medial:    7.83 cm/s LV PW:         0.90 cm   LV E/e' medial:  8.4 LV IVS:        0.90 cm   LV e' lateral:   9.14 cm/s LVOT diam:     2.10 cm   LV E/e' lateral: 7.2 LV SV:         65 LV SV Index:   31 LVOT Area:     3.46 cm  RIGHT VENTRICLE             IVC RV Basal diam:  3.30 cm     IVC diam: 2.60 cm RV S prime:     12.90 cm/s TAPSE (M-mode): 2.4 cm LEFT ATRIUM             Index        RIGHT ATRIUM           Index LA diam:        3.90 cm 1.89 cm/m   RA Area:     15.40 cm LA Vol (A2C):   47.3 ml 22.89 ml/m  RA Volume:   41.30 ml  19.99 ml/m LA Vol (A4C):   54.0 ml 26.13 ml/m LA Biplane Vol: 56.2 ml 27.20 ml/m  AORTIC VALVE                    PULMONIC VALVE AV Area (Vmax):    2.81 cm     PV Vmax:  0.85 m/s AV Area (Vmean):   2.56 cm     PV Peak grad:  2.9 mmHg AV Area (VTI):     2.65 cm AV Vmax:           111.00 cm/s AV Vmean:          73.100 cm/s AV VTI:            0.244 m AV Peak Grad:       4.9 mmHg AV Mean Grad:      3.0 mmHg LVOT Vmax:         90.10 cm/s LVOT Vmean:        54.000 cm/s LVOT VTI:          0.187 m LVOT/AV VTI ratio: 0.77  AORTA Ao Root diam: 3.00 cm MITRAL VALVE               TRICUSPID VALVE MV Area (PHT): 3.72 cm    TR Peak grad:   15.4 mmHg MV Decel Time: 204 msec    TR Vmax:        196.00 cm/s MV E velocity: 65.70 cm/s MV A velocity: 88.00 cm/s  SHUNTS MV E/A ratio:  0.75        Systemic VTI:  0.19 m                            Systemic Diam: 2.10 cm Lennie Odor MD Electronically signed by Lennie Odor MD Signature Date/Time: 06/06/2023/1:39:35 PM    Final    DG CHEST PORT 1 VIEW  Result Date: 06/06/2023 CLINICAL DATA:  Organ donor examination EXAM: PORTABLE CHEST 1 VIEW COMPARISON:  06/05/2023 FINDINGS: Endotracheal tube 2 cm above the carina. Orogastric or nasogastric tube enters the abdomen. Right arm PICC tip in the SVC above the right atrium. Mild atelectasis has developed in both lower lobes. Upper lungs are clear. IMPRESSION: Development of mild atelectasis in both lower lobes. Electronically Signed   By: Paulina Fusi M.D.   On: 06/06/2023 10:28   DG CHEST PORT 1 VIEW  Result Date: 06/05/2023 CLINICAL DATA:  Organ donation. EXAM: PORTABLE CHEST 1 VIEW COMPARISON:  X-ray 06/25/202 FINDINGS: Stable ET tube, enteric tube and right-sided PICC. Normal cardiopericardial silhouette. Mild basilar opacity on the right side is slightly improved. The left retrocardiac opacities improving. No pneumothorax, effusion or edema. Overlapping cardiac leads. Measurements. Cephalocaudal length of the lungs from apex to the top of the diaphragm on the right of 19.5 cm and left 19.8 cm. Transverse dimension of the thorax from inner table of ribs across the aortic arch of 24.8 cm. Diameter of the chest from inner table of the ribs across the top of the diaphragm of 28.2 cm. IMPRESSION: Improving lung base opacities with some residual. Stable tubes and lines. Electronically Signed   By:  Karen Kays M.D.   On: 06/05/2023 15:45   DG CHEST PORT 1 VIEW  Result Date: 06/05/2023 CLINICAL DATA:  [id number] Organ donation [id number] EXAM: PORTABLE CHEST 1 VIEW COMPARISON:  06/04/2023 FINDINGS: Endotracheal tube, enteric tube, and right PICC line remain appropriately positioned. Stable heart size. Bibasilar atelectasis. No large pleural fluid collection. No pneumothorax. IMPRESSION: Bibasilar atelectasis. Electronically Signed   By: Duanne Guess D.O.   On: 06/05/2023 15:22   CT CHEST ABDOMEN PELVIS WO CONTRAST  Result Date: 06/05/2023 CLINICAL DATA:  62 year old female with history of sepsis. EXAM: CT CHEST, ABDOMEN AND PELVIS WITHOUT CONTRAST TECHNIQUE: Multidetector CT  imaging of the chest, abdomen and pelvis was performed following the standard protocol without IV contrast. RADIATION DOSE REDUCTION: This exam was performed according to the departmental dose-optimization program which includes automated exposure control, adjustment of the mA and/or kV according to patient size and/or use of iterative reconstruction technique. COMPARISON:  CTA of the chest 05/19/2023. No prior CT of the abdomen and pelvis. FINDINGS: CT CHEST FINDINGS Cardiovascular: Heart size is normal. There is no significant pericardial fluid, thickening or pericardial calcification. There is aortic atherosclerosis, as well as atherosclerosis of the great vessels of the mediastinum and the coronary arteries, including calcified atherosclerotic plaque in the left main, left anterior descending and left circumflex coronary arteries. Mediastinum/Nodes: Patient is intubated, with the tip of the endotracheal tube 2.9 cm above the carina. No pathologically enlarged mediastinal or hilar lymph nodes. Large hiatal hernia. Nasogastric tube extending into the stomach. No axillary lymphadenopathy. Lungs/Pleura: Extensive areas of dependent atelectasis and airspace consolidation are noted in the lower lobes of the lungs bilaterally. Trace  bilateral pleural effusions. No definite suspicious appearing pulmonary nodules or masses are noted. Musculoskeletal: There are no aggressive appearing lytic or blastic lesions noted in the visualized portions of the skeleton. CT ABDOMEN PELVIS FINDINGS Hepatobiliary: No definite suspicious cystic or solid hepatic lesions are confidently identified on today's noncontrast CT examination. Gallbladder is not visualized, either completely decompressed or surgically absent. Pancreas: No definite pancreatic mass or peripancreatic fluid collections or inflammatory changes are noted on today's noncontrast CT examination. Spleen: Unremarkable. Adrenals/Urinary Tract: 2.1 cm low-attenuation lesion in the lower pole of the right kidney, incompletely characterized on today's noncontrast CT examination, but statistically likely a cyst (no imaging follow-up recommended). Unenhanced appearance of the left kidney is unremarkable. No hydroureteronephrosis. Urinary bladder is nearly completely obscured by beam hardening artifact from bilateral hip arthroplasties, but there appears to be a Foley balloon catheter in position. Adreniform thickening of the adrenal glands bilaterally, suggesting adrenal hyperplasia. Stomach/Bowel: Stomach is decompressed with a nasogastric tube in position in the body of the stomach. No pathologic dilatation of small bowel or colon. Normal appendix. Vascular/Lymphatic: Atherosclerosis in the abdominal aorta and pelvic vasculature. No lymphadenopathy noted in the abdomen or pelvis. Reproductive: Uterus and ovaries are obscured by beam hardening artifact from the patient's bilateral hip arthroplasties. Other: No significant volume of ascites.  No pneumoperitoneum. Musculoskeletal: Status post bilateral hip arthroplasty. There are no aggressive appearing lytic or blastic lesions noted in the visualized portions of the skeleton. IMPRESSION: 1. Extensive areas of atelectasis and consolidation in the dependent  portions of the lungs bilaterally, which could reflect pneumonia or sequela of aspiration. 2. Trace bilateral pleural effusions. 3. No acute findings are noted in the abdomen or pelvis. 4. Support apparatus, as above. 5. Aortic atherosclerosis, in addition to left main and 2 vessel coronary artery disease. Please note that although the presence of coronary artery calcium documents the presence of coronary artery disease, the severity of this disease and any potential stenosis cannot be assessed on this non-gated CT examination. Assessment for potential risk factor modification, dietary therapy or pharmacologic therapy may be warranted, if clinically indicated. 6. Additional incidental findings, as above. Electronically Signed   By: Trudie Reed M.D.   On: 06/05/2023 07:42   DG CHEST PORT 1 VIEW  Result Date: 06/05/2023 CLINICAL DATA:  [id number] would ventilator dependent respiratory failure. EXAM: PORTABLE CHEST 1 VIEW COMPARISON:  Portable chest 05/31/23 FINDINGS: 9:40 p.m. ETT tip is 4 cm from carina. NGT enters the stomach  with the full intragastric course not filmed. A moderate-sized hiatal hernia has been seen on prior studies. Right PICC tip remains in the distal SVC. There is stable cardiomegaly, mild, with mild perihilar vascular congestion without overt edema. Small pleural effusions are forming compared to the prior study. There are increased atelectatic bands in the bases but no convincing consolidation. The mid and upper lung fields remain clear. In all other respects no further changes. There is calcification in the aortic arch with stable mediastinum. No new osseous findings. IMPRESSION: 1. Stable cardiomegaly and mild perihilar vascular congestion. 2. Small pleural effusions new in the interval, with increased atelectatic bands in the bases. 3. Support devices as above. 4. Aortic atherosclerosis. Electronically Signed   By: Almira Bar M.D.   On: 06/05/2023 00:50   DG Abd 1 View  Result  Date: 06/03/2023 CLINICAL DATA:  Evaluate enteric tube placement. EXAM: ABDOMEN - 1 VIEW COMPARISON:  05/28/2023 FINDINGS: Enteric tube tip and side port are in the proximal stomach below the level of the GE junction. Bowel gas pattern appears nonobstructed. Gas is seen within the colon and rectum. No air-fluid levels. Status post bilateral hip arthroplasty. IMPRESSION: Enteric tube tip and side port are in the proximal stomach. Electronically Signed   By: Signa Kell M.D.   On: 06/03/2023 12:29   CT HEAD WO CONTRAST ( )  Result Date: 05/31/2023 CLINICAL DATA:  Subarachnoid hemorrhage [facility]) Mental status change, unknown cause. EXAM: CT HEAD WITHOUT CONTRAST TECHNIQUE: Contiguous axial images were obtained from the base of the skull through the vertex without intravenous contrast. RADIATION DOSE REDUCTION: This exam was performed according to the departmental dose-optimization program which includes automated exposure control, adjustment of the mA and/or kV according to patient size and/or use of iterative reconstruction technique. COMPARISON:  Head CT 05/28/2023.  MRI brain 05/28/2023. FINDINGS: Brain: Unchanged appearance of the right-greater-than-left hemorrhagic contusions of the anterior frontal lobes. Interval evolution of the now subacute large right PCA territory infarct with increased sulcal effacement. No evidence of hemorrhagic conversion. Unchanged 9 mm of leftward midline shift and right uncal herniation (axial image 10 series 3). No hydrocephalus or extra-axial collection. Vascular: No hyperdense vessel or unexpected calcification. Skull: No calvarial fracture or suspicious bone lesion. Skull base is unremarkable. Sinuses/Orbits: Unchanged complete opacification of the right frontal and right maxillary sinuses. Near-complete opacification of the left maxillary sinus. Moderate mucosal disease in the left sphenoid sinus. Orbits are unremarkable. Other: None. IMPRESSION: 1. Unchanged appearance of  the right-greater-than-left hemorrhagic contusions of the anterior frontal lobes. 2. Interval evolution of the now subacute large right PCA territory infarct with increased sulcal effacement. No evidence of hemorrhagic conversion. 3. Unchanged 9 mm of leftward midline shift and right uncal herniation. Electronically Signed   By: Orvan Falconer M.D.   On: 05/31/2023 12:37   DG Chest Port 1 View  Result Date: 05/31/2023 CLINICAL DATA:  Provided history: Respiratory failure. EXAM: PORTABLE CHEST 1 VIEW COMPARISON:  Prior chest radiograph 05/28/2023 and earlier. Chest CT 2023-06-13. FINDINGS: ET tube present with tip 3 cm above the level of the carina. Right sided PICC with tip at the level of the superior cavoatrial junction. An enteric tube loops within a hiatal hernia and terminates in the expected location of the gastric body. Heart size within normal limits. Aortic atherosclerosis. Mild prominence of the interstitial lung markings suggesting interstitial edema. Minimal ill-defined opacity within the medial right lung base. No appreciable airspace consolidation on the left. No evidence of pleural effusion  or pneumothorax. IMPRESSION: 1. Support apparatus as described. 2. Mild prominence of the interstitial lung markings suggesting interstitial edema. 3. Mild ill-defined opacity within the right lung base right lung base with an appearance favoring atelectasis. Attention recommended on radiographic follow-up. 4.  Aortic Atherosclerosis (ICD10-I70.0). Electronically Signed   By: Jackey Loge D.O.   On: 05/31/2023 08:43   VAS [place] LOWER EXTREMITY VENOUS (DVT)  Result Date: 05/30/2023  Lower Venous DVT Study Patient Name:  ODDIE HWANG Nichol  Date of Exam:   05/30/2023 Medical Rec #: [id number]      Accession #:    [id number] Date of Birth: 1961/05/02      Patient Gender: F Patient Age:   69 years Exam Location:  [facility] Procedure:      VAS [place] LOWER EXTREMITY VENOUS (DVT) Referring Phys: CHI ELLISON  --------------------------------------------------------------------------------  Indications: Stroke.  Comparison Study: No prior studies. Performing Technologist: Jean Rosenthal RDMS, RVT  Examination Guidelines: A complete evaluation includes B-mode imaging, spectral Doppler, color Doppler, and power Doppler as needed of all accessible portions of each vessel. Bilateral testing is considered an integral part of a complete examination. Limited examinations for reoccurring indications may be performed as noted. The reflux portion of the exam is performed with the patient in reverse Trendelenburg.  +---------+---------------+---------+-----------+----------+--------------+ RIGHT    CompressibilityPhasicitySpontaneityPropertiesThrombus Aging +---------+---------------+---------+-----------+----------+--------------+ CFV      Full           Yes      Yes                                 +---------+---------------+---------+-----------+----------+--------------+ SFJ      Full                                                        +---------+---------------+---------+-----------+----------+--------------+ FV Prox  Full                                                        +---------+---------------+---------+-----------+----------+--------------+ FV Mid   Full                                                        +---------+---------------+---------+-----------+----------+--------------+ FV DistalFull                                                        +---------+---------------+---------+-----------+----------+--------------+ PFV      Full                                                        +---------+---------------+---------+-----------+----------+--------------+ POP      Full  Yes      Yes                                 +---------+---------------+---------+-----------+----------+--------------+ PTV      Full                                                         +---------+---------------+---------+-----------+----------+--------------+ PERO     Full                                                        +---------+---------------+---------+-----------+----------+--------------+   +---------+---------------+---------+-----------+----------+--------------+ LEFT     CompressibilityPhasicitySpontaneityPropertiesThrombus Aging +---------+---------------+---------+-----------+----------+--------------+ CFV      Full           Yes      Yes                                 +---------+---------------+---------+-----------+----------+--------------+ SFJ      Full                                                        +---------+---------------+---------+-----------+----------+--------------+ FV Prox  Full                                                        +---------+---------------+---------+-----------+----------+--------------+ FV Mid   Full                                                        +---------+---------------+---------+-----------+----------+--------------+ FV DistalFull                                                        +---------+---------------+---------+-----------+----------+--------------+ PFV      Full                                                        +---------+---------------+---------+-----------+----------+--------------+ POP      Full           Yes      Yes                                 +---------+---------------+---------+-----------+----------+--------------+ PTV  Full                                                        +---------+---------------+---------+-----------+----------+--------------+ PERO     Full                                                        +---------+---------------+---------+-----------+----------+--------------+     Summary: RIGHT: - There is no evidence of deep vein thrombosis in the lower extremity.  - No  cystic structure found in the popliteal fossa.  LEFT: - There is no evidence of deep vein thrombosis in the lower extremity.  - No cystic structure found in the popliteal fossa.  *See table(s) above for measurements and observations. Electronically signed by Coral Else MD on 05/30/2023 at 11:41:41 PM.    Final    Overnight EEG with video  Result Date: 05/29/2023 Charlsie Quest, MD     05/30/2023  9:40 AM Patient Name: TIMAKA BUCKLEY MRN: [id number] Epilepsy Attending: Charlsie Quest Referring Physician/Provider: Erick Blinks, MD Duration: 05/28/2023 2222 to 05/29/2023 2222  Patient history: 62 y.o. female presenting after syncopal episode of unclear etiology. Pt has non-focal neurologic exam with somewhat depressed level of consciousness and CT demonstrates traumatic pattern of right frontal hemorrhage although there is some SAH primarily in the right Sylvian fissure. EEG to evaluate for seizure.  Level of alertness:  lethargic  AEDs during EEG study: None  Technical aspects: This EEG study was done with scalp electrodes positioned according to the 10-20 International system of electrode placement. Electrical activity was reviewed with band Camerer filter of 1-70Hz , sensitivity of 7 uV/mm, display speed of 48mm/sec with a 60Hz  notched filter applied as appropriate. EEG data were recorded continuously and digitally stored.  Video monitoring was available and reviewed as appropriate.  Description: EEG showed continuous generalized and lateralized right hemisphere predominantly 5 to 7 Hz theta slowing admixed with intermittent 2-3Hz  delta slowing. Hyperventilation and photic stimulation were not performed.    ABNORMALITY - Continuous slow, generalized and lateralized right hemisphere  IMPRESSION: This study is suggestive of cortical dysfunction arising from right hemisphere likely secondary to underlying structural abnormality. Additionally there is moderate diffuse encephalopathy, nonspecific etiology. No  seizures or epileptiform discharges were seen throughout the recording.  Charlsie Quest    MR BRAIN WO CONTRAST  Result Date: 05/28/2023 CLINICAL DATA:  Altered mental status EXAM: MRI HEAD WITHOUT CONTRAST TECHNIQUE: Multiplanar, multiecho pulse sequences of the brain and surrounding structures were obtained without intravenous contrast. COMPARISON:  None Available. FINDINGS: Brain: Acute cortical ischemia throughout the right PCA territory with small areas of acute ischemia of the right thalamus and the bilateral midbrain. Bilateral, right greater than left, mixed intra-axial and extra-axial hematomas of the anterior convexities. There is 7 mm of leftward midline shift. No hydrocephalus. Shallow sella turcica. Vascular: Absence of the normal right PCA flow void. Skull and upper cervical spine: Bone marrow signal is normal. Known left occipital condyle fracture better characterized on recent CT. Sinuses/Orbits:Bilateral chronic maxillary sinus disease and opacification of the right frontal and right anterior ethmoid sinuses. Normal orbits. IMPRESSION: 1. Acute cortical ischemia throughout the right  PCA territory with small areas of acute ischemia of the right thalamus and the bilateral midbrain. 2. Bilateral, right greater than left, mixed intra- and extra-axial hematomas of the anterior convexities with 7 mm of leftward midline shift. Electronically Signed   By: Deatra Robinson M.D.   On: 05/28/2023 21:08   DG Abd 1 View  Result Date: 05/28/2023 CLINICAL DATA:  Intubation and orogastric tube EXAM: ABDOMEN - 1 VIEW COMPARISON:  None Available. FINDINGS: Enteric tube tip is in the mid stomach. No dilated bowel loops are seen. IMPRESSION: Enteric tube tip is in the mid stomach. Electronically Signed   By: Darliss Cheney M.D.   On: 05/28/2023 21:08   DG CHEST PORT 1 VIEW  Result Date: 05/28/2023 CLINICAL DATA:  Intubated EXAM: PORTABLE CHEST 1 VIEW COMPARISON:  05/28/2023, chest CT 05/12/2023 FINDINGS:  Endotracheal tube tip about 2 cm superior to carina. Esophageal tube tip in the left upper quadrant, this is looped within the epigastric area. No acute airspace disease or effusion. Stable cardiomediastinal silhouette. Right upper extremity central venous catheter tip over the SVC. Aortic atherosclerosis IMPRESSION: Endotracheal tube tip about 2 cm superior to carina. Minimal atelectasis right base. Electronically Signed   By: Jasmine Pang M.D.   On: 05/28/2023 21:05   DG CHEST PORT 1 VIEW  Result Date: 05/28/2023 CLINICAL DATA:  Intubation. EXAM: PORTABLE CHEST 1 VIEW COMPARISON:  CT chest and chest x-ray dated May 22, 2023. FINDINGS: Endotracheal tube tip just below the carina in the right mainstem bronchus. Enteric tube looped within a hiatal hernia in the lower chest, with the tip and distal side port appropriately positioned below the diaphragm. Right upper extremity PICC line with tip in the distal SVC. The heart size and mediastinal contours are within normal limits. Normal pulmonary vascularity. Mild right basilar atelectasis. No focal consolidation, pleural effusion, or pneumothorax. No acute osseous abnormality. IMPRESSION: 1. Endotracheal tube tip just below the carina in the right mainstem bronchus. Recommend retracting 3 cm. 2. Enteric tube appropriately positioned below the diaphragm after traversing the hiatal hernia. 3. No active disease. Electronically Signed   By: Obie Dredge M.D.   On: 05/28/2023 15:11   EEG adult  Result Date: 05/28/2023 Charlsie Quest, MD     05/28/2023  9:35 AM Patient Name: SAJA BALAGUER MRN: [id number] Epilepsy Attending: Charlsie Quest Referring Physician/Provider: Marjorie Smolder, NP Date: 05/28/2023 Duration: 25.37 mins Patient history: 62 y.o. female presenting after syncopal episode of unclear etiology. Pt has non-focal neurologic exam with somewhat depressed level of consciousness and CT demonstrates traumatic pattern of right frontal hemorrhage  although there is some SAH primarily in the right Sylvian fissure. EEG to evaluate for seizure. Level of alertness:  lethargic AEDs during EEG study: LEV Technical aspects: This EEG study was done with scalp electrodes positioned according to the 10-20 International system of electrode placement. Electrical activity was reviewed with band [place] filter of 1-70Hz , sensitivity of 7 uV/mm, display speed of 29mm/sec with a 60Hz  notched filter applied as appropriate. EEG data were recorded continuously and digitally stored.  Video monitoring was available and reviewed as appropriate. Description: EEG showed continuous generalized predominantly 5 to 7 Hz theta slowing admixed with intermittent 2-3Hz  delta slowing. Hyperventilation and photic stimulation were not performed.   ABNORMALITY - Continuous slow, generalized IMPRESSION: This study is suggestive of moderate diffuse encephalopathy, nonspecific etiology. No seizures or epileptiform discharges were seen throughout the recording. [name]   CT HEAD WO  CONTRAST ( )  Result Date: 05/28/2023 CLINICAL DATA:  Mental status change, unknown cause EXAM: CT HEAD WITHOUT CONTRAST TECHNIQUE: Contiguous axial images were obtained from the base of the skull through the vertex without intravenous contrast. RADIATION DOSE REDUCTION: This exam was performed according to the departmental dose-optimization program which includes automated exposure control, adjustment of the mA and/or kV according to patient size and/or use of iterative reconstruction technique. COMPARISON:  CT Head 05/26/23 FINDINGS: Brain: Redemonstrated right frontal lobe hemorrhagic contusion not significantly changed from prior exam with persistent approximate 7 mm leftward midline shift. Small volume extra axial blood products along the right frontal lobe and in the right middle cranial fossa is unchanged. Smaller hemorrhagic contusion is also present in the inferior left frontal lobe, which is  unchanged from prior exam. There is likely an additional site of nonhemorrhagic contusion in the anterior right temporal lobe (series 3, image 7), which is also unchanged. No new sites of hemorrhage visualized. No CT evidence of an acute infarct. No hydrocephalus. Vascular: No hyperdense vessel or unexpected calcification. Skull: Normal. Negative for fracture or focal lesion. Sinuses/Orbits: No middle ear or mastoid effusion. Pansinus mucosal thickening with complete opacification of bilateral maxillary sinuses and right frontal sinus. There are osseous changes suggestive of bilateral maxillary sinusitis. Other: Redemonstrated nondisplaced left skull base fracture involving the left occipital condyle. IMPRESSION: 1. No significant interval change from prior exam with unchanged hemorrhagic contusions in the bilateral frontal lobes (right greater than left) and non hemorrhagic contusion at the anterior right temporal lobe. 2. Small volume extra axial blood products along the right frontal lobe and in the right middle cranial fossa is unchanged. 3. Redemonstrated nondisplaced left skull base fracture involving the left occipital condyle Electronically Signed   By: Lorenza Cambridge M.D.   On: 05/28/2023 07:50   CT HEAD WO CONTRAST ( )  Result Date: 05/26/2023 CLINICAL DATA:  62 year old female status post fall striking head. Right greater than left frontotemporal hemorrhagic contusions with SH and intracranial mass effect. Subsequent encounter. EXAM: CT HEAD WITHOUT CONTRAST TECHNIQUE: Contiguous axial images were obtained from the base of the skull through the vertex without intravenous contrast. RADIATION DOSE REDUCTION: This exam was performed according to the departmental dose-optimization program which includes automated exposure control, adjustment of the mA and/or kV according to patient size and/or use of iterative reconstruction technique. COMPARISON:  Head CT 05/24/2023 and earlier. FINDINGS: Brain:  Extensive hemorrhagic contusions in the anterior right frontal lobe with edema and mass effect. Smaller left inferior frontal gyrus hemorrhagic contusion and edema. Small right anterior temporal hemorrhagic contusion and edema. Mild extra-axial hemorrhage along the right anterior frontal convexity appears stable. Intracranial mass effect, especially on the right lateral ventricle and midline shift of 5 mm appears stable since 05/24/2023. Stable relatively maintained basilar cistern patency. No ventriculomegaly. No new intracranial hemorrhage identified. Stable gray-white differentiation elsewhere. No cortically based acute infarct identified. Vascular: Calcified atherosclerosis at the skull base. No suspicious intracranial vascular hyperdensity. Skull: Stable. Nondisplaced left occipital bone fracture is identified and tracks to the left skull base and left occipital condyle. This is unchanged. No new osseous injury identified. Sinuses/Orbits: Stable sinus opacification. Tympanic cavities and mastoids remain clear. Other: Left occiput scalp hematoma and skin staples. Ongoing Disconjugate gaze. IMPRESSION: 1. Stable since 05/24/2023: Ongoing extensive right anterior frontal lobe hemorrhagic contusion with edema and mass effect. Smaller hemorrhagic contusions in the left inferior frontal gyrus and right anterior temporal pole. And small volume anterior right frontal lobe extra-axial blood.  Stable intracranial mass effect with effaced right lateral ventricle and 5 mm of leftward midline shift. 2. There is an associated non-depressed left skull base fracture tracking from the occipital condyle toward the occipital protuberance. 3. No new intracranial abnormality. Electronically Signed   By: Odessa Fleming M.D.   On: 05/26/2023 09:04   [place] EKG SITE RITE  Result Date: 05/24/2023 If Site Rite image not attached, placement could not be confirmed due to current cardiac rhythm.  CT HEAD WO CONTRAST ( )  Result Date:  05/24/2023 CLINICAL DATA:  62 year old female status post fall striking head. Intracranial hemorrhage. Subsequent encounter. EXAM: CT HEAD WITHOUT CONTRAST TECHNIQUE: Contiguous axial images were obtained from the base of the skull through the vertex without intravenous contrast. RADIATION DOSE REDUCTION: This exam was performed according to the departmental dose-optimization program which includes automated exposure control, adjustment of the mA and/or kV according to patient size and/or use of iterative reconstruction technique. COMPARISON:  Head CT 05/23/2023 and earlier. FINDINGS: Brain: Relatively large anterior right frontal lobe hemorrhagic contusion extending from the inferior through the middle frontal gyrus with confluent regional edema. Smaller left inferior frontal gyrus hemorrhagic contusion and edema. Mild right anterior temporal lobe hemorrhagic contusion and edema. Mass effect on the anterolateral ventricles with leftward midline shift of 5-6 mm is stable. No IVH or ventriculomegaly. Basilar cisterns are patent. Trace subarachnoid hemorrhage elsewhere along the right hemisphere. No convincing SDH. Thalamic gray-white differentiation appears normal today. Stable gray-white matter differentiation throughout the brain. No cortically based acute infarct identified. Vascular: Calcified atherosclerosis at the skull base. Skull: No fracture identified. Sinuses/Orbits: Scattered paranasal sinus opacification and fluid levels, favor inflammatory. Tympanic cavities and mastoids remain clear. Other: Left posterior scalp skin staples. No new orbit or scalp soft tissue injury. IMPRESSION: 1. Stable right > left frontal and right temporal lobe hemorrhagic contusions with edema. Stable intracranial mass effect with leftward midline shift of 5-6 mm. Mild SAH. 2. No new intracranial abnormality. Electronically Signed   By: Odessa Fleming M.D.   On: 05/24/2023 11:02   [place] EKG SITE RITE  Result Date: 05/24/2023 If Site  Rite image not attached, placement could not be confirmed due to current cardiac rhythm.  CT HEAD WO CONTRAST ( )  Result Date: 05/24/2023 CLINICAL DATA:  Initial evaluation for mental status change, unknown cause. EXAM: CT HEAD WITHOUT CONTRAST TECHNIQUE: Contiguous axial images were obtained from the base of the skull through the vertex without intravenous contrast. RADIATION DOSE REDUCTION: This exam was performed according to the departmental dose-optimization program which includes automated exposure control, adjustment of the mA and/or kV according to patient size and/or use of iterative reconstruction technique. COMPARISON:  Prior CT from 05/30/23. FINDINGS: Brain: Continued interval evolution of posttraumatic hemorrhagic contusions involving the anterior right frontal lobe. Hyperdense hemorrhage is increased in size now measuring 5.5 x 3.2 x 3.3 cm. Surrounding vasogenic edema and regional mass effect has progressed and worsened. Associated right-to-left shift now measures up to 5 mm, progressed. An additional smaller hemorrhagic contusion at the anterior left frontal convexity measures up to 12 mm (series 4, image 8). Mild surrounding edema without significant regional mass effect. Scattered subarachnoid hemorrhage at the right frontal region has decreased from prior. Trace hemorrhage noted along the anterior falx. No new intracranial hemorrhage. No visible acute cortically based infarct. There is question of symmetric hypodensity involving the thalami bilaterally (series 4, image 12). While this appearance may be technical in nature, possible evolving ischemia or other process is difficult to  exclude. No mass lesion. No hydrocephalus. No extra-axial fluid collection. Vascular: No visible asymmetric hyperdense vessel. Skull: Evolving left posterior scalp contusion/laceration. Underlying nondisplaced, nondepressed skull fracture again noted. No new finding. Sinuses/Orbits: Globes and orbital soft  tissues demonstrate no acute finding. Chronic right frontoethmoidal, sphenoid, and maxillary sinusitis again noted. Mastoid air cells remain clear. Other: None. IMPRESSION: 1. Increased size of hemorrhagic contusion involving the anterior right frontal lobe, now measuring 5.5 x 3.2 x 3.3 cm. Surrounding vasogenic edema and regional mass effect has progressed and worsened, with 5 mm of right-to-left shift now seen. 2. Additional smaller 12 mm hemorrhagic contusion at the anterior left frontal convexity. 3. Interval decrease in scattered small volume subarachnoid hemorrhage. 4. Question of symmetric hypodensity involving the thalami bilaterally. While this appearance may be technical in nature, possible evolving ischemia or other process is difficult to exclude. Correlation with dedicated MRI could be performed for further evaluation as warranted. 5. Evolving left posterior scalp contusion/laceration with underlying nondisplaced, nondepressed skull fracture. Electronically Signed   By: Rise Mu M.D.   On: 05/24/2023 02:39   ECHOCARDIOGRAM COMPLETE  Result Date: 05/23/2023    ECHOCARDIOGRAM REPORT   Patient Name:   ELIANYS GREULICH Pickford Date of Exam: 05/23/2023 Medical Rec #:  [id number]     Height:       67.5 in Accession #:    [id number]    Weight:       200.0 lb Date of Birth:  11-Aug-1961     BSA:          2.033 m Patient Age:    62 years      BP:           142/63 mmHg Patient Gender: F             HR:           51 bpm. Exam Location:  Inpatient Procedure: 2D Echo, Cardiac Doppler and Color Doppler Indications:    Syncope  History:        Patient has prior history of Echocardiogram examinations, most                 recent 11/10/2018. Previous Myocardial Infarction and coronary                 vasospasm, Signs/Symptoms:ICH and Syncope; Risk                 Factors:Hypertension, Former Smoker and Diabetes.  Sonographer:    Wallie Char Referring Phys: [id number] Beulah Gandy PAYNE IMPRESSIONS  1. Left ventricular  ejection fraction, by estimation, is 60 to 65%. The left ventricle has normal function. The left ventricle has no regional wall motion abnormalities. Left ventricular diastolic parameters were normal.  2. Right ventricular systolic function is normal. The right ventricular size is moderately enlarged. There is moderately elevated pulmonary artery systolic pressure. The estimated right ventricular systolic pressure is 48.9 mmHg.  3. The mitral valve is normal in structure. No evidence of mitral valve regurgitation. No evidence of mitral stenosis.  4. The aortic valve is tricuspid. Aortic valve regurgitation is not visualized. No aortic stenosis is present.  5. The inferior vena cava is dilated in size with <50% respiratory variability, suggesting right atrial pressure of 15 mmHg. Comparison(s): No significant change from prior study. Prior images reviewed side by side. FINDINGS  Left Ventricle: Left ventricular ejection fraction, by estimation, is 60 to 65%. The left ventricle has normal function. The left ventricle has no regional wall  motion abnormalities. The left ventricular internal cavity size was normal in size. There is  no left ventricular hypertrophy. Left ventricular diastolic parameters were normal. Right Ventricle: The right ventricular size is moderately enlarged. No increase in right ventricular wall thickness. Right ventricular systolic function is normal. There is moderately elevated pulmonary artery systolic pressure. The tricuspid regurgitant  velocity is 2.91 m/s, and with an assumed right atrial pressure of 15 mmHg, the estimated right ventricular systolic pressure is 48.9 mmHg. Left Atrium: Left atrial size was normal in size. Right Atrium: Right atrial size was normal in size. Prominent Crista terminalis. Pericardium: There is no evidence of pericardial effusion. Mitral Valve: The mitral valve is normal in structure. No evidence of mitral valve regurgitation. No evidence of mitral valve  stenosis. MV peak gradient, 5.2 mmHg. The mean mitral valve gradient is 2.0 mmHg. Tricuspid Valve: The tricuspid valve is normal in structure. Tricuspid valve regurgitation is mild . No evidence of tricuspid stenosis. Aortic Valve: The aortic valve is tricuspid. Aortic valve regurgitation is not visualized. No aortic stenosis is present. Aortic valve mean gradient measures 3.5 mmHg. Aortic valve peak gradient measures 6.9 mmHg. Aortic valve area, by VTI measures 3.00 cm. Pulmonic Valve: The pulmonic valve was normal in structure. Pulmonic valve regurgitation is not visualized. No evidence of pulmonic stenosis. Aorta: The aortic root and ascending aorta are structurally normal, with no evidence of dilitation. Venous: The inferior vena cava is dilated in size with less than 50% respiratory variability, suggesting right atrial pressure of 15 mmHg. IAS/Shunts: No atrial level shunt detected by color flow Doppler.  LEFT VENTRICLE PLAX 2D LVIDd:         4.60 cm     Diastology LVIDs:         3.20 cm     LV e' medial:    8.59 cm/s LV PW:         0.90 cm     LV E/e' medial:  11.3 LV IVS:        0.80 cm     LV e' lateral:   14.20 cm/s LVOT diam:     2.00 cm     LV E/e' lateral: 6.8 LV SV:         91 LV SV Index:   45 LVOT Area:     3.14 cm  LV Volumes (MOD) LV vol d, MOD A2C: 97.6 ml LV vol d, MOD A4C: 95.9 ml LV vol s, MOD A2C: 39.2 ml LV vol s, MOD A4C: 40.1 ml LV SV MOD A2C:     58.4 ml LV SV MOD A4C:     95.9 ml LV SV MOD BP:      60.4 ml RIGHT VENTRICLE             IVC RV Basal diam:  4.50 cm     IVC diam: 2.90 cm RV S prime:     15.00 cm/s TAPSE (M-mode): 2.8 cm LEFT ATRIUM             Index        RIGHT ATRIUM           Index LA diam:        3.90 cm 1.92 cm/m   RA Area:     19.90 cm LA Vol (A2C):   51.4 ml 25.28 ml/m  RA Volume:   61.00 ml  30.00 ml/m LA Vol (A4C):   60.8 ml 29.90 ml/m LA Biplane Vol: 57.2 ml 28.13 ml/m  AORTIC VALVE AV Area (Vmax):    3.03 cm AV Area (Vmean):   3.01 cm AV Area (VTI):      3.00 cm AV Vmax:           131.50 cm/s AV Vmean:          86.150 cm/s AV VTI:            0.304 m AV Peak Grad:      6.9 mmHg AV Mean Grad:      3.5 mmHg LVOT Vmax:         127.00 cm/s LVOT Vmean:        82.550 cm/s LVOT VTI:          0.290 m LVOT/AV VTI ratio: 0.96  AORTA Ao Root diam: 3.20 cm Ao Asc diam:  3.00 cm MITRAL VALVE               TRICUSPID VALVE MV Area (PHT): 3.77 cm    TR Peak grad:   33.9 mmHg MV Area VTI:   2.86 cm    TR Vmax:        291.00 cm/s MV Peak grad:  5.2 mmHg MV Mean grad:  2.0 mmHg    SHUNTS MV Vmax:       1.14 m/s    Systemic VTI:  0.29 m MV Vmean:      65.6 cm/s   Systemic Diam: 2.00 cm MV Decel Time: 201 msec MV E velocity: 96.70 cm/s MV A velocity: 59.50 cm/s MV E/A ratio:  1.63 Riley Lam MD Electronically signed by Riley Lam MD Signature Date/Time: 05/23/2023/11:21:06 AM    Final    EEG adult  Result Date: 05/23/2023 Charlsie Quest, MD     05/23/2023  8:40 AM Patient Name: MARIADE BUBIER MRN: [id number] Epilepsy Attending: Charlsie Quest Referring Physician/Provider: Harolyn Rutherford, MD Date:05/27/2023 Duration: 21.25 mins Patient history:  62 y.o. female presenting after syncopal episode of unclear etiology. Pt has non-focal neurologic exam with somewhat depressed level of consciousness and CT demonstrates traumatic pattern of right frontal hemorrhage although there is some SAH primarily in the right Sylvian fissure. EEG to evaluate for seizure. Level of alertness: Awake, drowsy AEDs during EEG study: LEV Technical aspects: This EEG study was done with scalp electrodes positioned according to the 10-20 International system of electrode placement. Electrical activity was reviewed with band [place] filter of 1-70Hz , sensitivity of 7 uV/mm, display speed of 75mm/sec with a 60Hz  notched filter applied as appropriate. EEG data were recorded continuously and digitally stored.  Video monitoring was available and reviewed as appropriate. Description: The posterior  dominant rhythm consists of 8-9 Hz activity of moderate voltage (25-35 uV) seen predominantly in posterior head regions, symmetric and reactive to eye opening and eye closing. Drowsiness was characterized by attenuation of the posterior background rhythm. Hyperventilation and photic stimulation were not performed.   IMPRESSION: This study is within normal limits. No seizures or epileptiform discharges were seen throughout the recording. A normal interictal EEG does not exclude the diagnosis of epilepsy. [name]   CT HEAD WO CONTRAST ( )  Result Date: 06/10/2023 CLINICAL DATA:  Follow-up examination for hemorrhage. EXAM: CT HEAD WITHOUT CONTRAST TECHNIQUE: Contiguous axial images were obtained from the base of the skull through the vertex without intravenous contrast. RADIATION DOSE REDUCTION: This exam was performed according to the departmental dose-optimization program which includes automated exposure control, adjustment of the mA and/or kV according to patient size and/or use of  iterative reconstruction technique. COMPARISON:  CT from earlier the same day. FINDINGS: Brain: Scattered posttraumatic intraparenchymal and subarachnoid hemorrhage at the anterior right frontal convexity, slightly increased in prominence from previous. Largest component now measures up to 3 cm in diameter. Mild mass effect with no more than trace right-to-left shift, stable. No other new intracranial hemorrhage. No acute large vessel territory infarct. No mass lesion or hydrocephalus. No significant extra-axial collection. Vascular: No abnormal hyperdense vessel. Skull: Evolving left posterior scalp contusion with laceration. Skin staples in place. There is an underlying acute nondisplaced, non this pressed occipital skull fracture (series 3, image 21). Inferior extension towards the right occipital condyle (series 3, image 14). Sinuses/Orbits: Globes orbital soft tissues within normal limits. Chronic right  frontoethmoidal and maxillary sinusitis. Mastoid air cells and middle ear cavities are largely clear. Other: None. IMPRESSION: 1. Scattered posttraumatic subarachnoid hemorrhage with superimposed hemorrhagic contusions at the anterior right frontal convexity, slightly increased in prominence from previous. Largest component now measures up to 3 cm in diameter. Mild mass effect with no more than trace right-to-left shift, stable. 2. Evolving left posterior scalp contusion with laceration. Underlying acute nondisplaced, nondepressed occipital skull fracture. 3. Chronic right frontoethmoidal and maxillary sinusitis. Electronically Signed   By: Rise Mu M.D.   On: 05/15/2023 23:15   CT ANGIO HEAD NECK W WO CM  Result Date: 05/28/2023 CLINICAL DATA:  Evaluate for aneurysm EXAM: CT ANGIOGRAPHY HEAD AND NECK WITH AND WITHOUT CONTRAST TECHNIQUE: Multidetector CT imaging of the head and neck was performed using the standard protocol during bolus administration of intravenous contrast. Multiplanar CT image reconstructions and MIPs were obtained to evaluate the vascular anatomy. Carotid stenosis measurements (when applicable) are obtained utilizing NASCET criteria, using the distal internal carotid diameter as the denominator. RADIATION DOSE REDUCTION: This exam was performed according to the departmental dose-optimization program which includes automated exposure control, adjustment of the mA and/or kV according to patient size and/or use of iterative reconstruction technique. CONTRAST:  75mL OMNIPAQUE IOHEXOL 350 MG/ML SOLN COMPARISON:  No prior CTA available, correlation is made with CT head and cervical spine 06/01/2023 FINDINGS: CT HEAD FINDINGS For noncontrast findings, please see same day CT head. CTA NECK FINDINGS Aortic arch: Two-vessel arch with a common origin of the brachiocephalic and left common carotid arteries. Imaged portion shows no evidence of aneurysm or dissection. No significant stenosis  of the major arch vessel origins. Right carotid system: No evidence of stenosis, dissection, or occlusion. Left carotid system: No evidence of stenosis, dissection, or occlusion. Vertebral arteries: Codominant. No evidence of stenosis, dissection, or occlusion. Skeleton: No acute osseous abnormality. Degenerative changes in the cervical spine. Other neck: Negative. Upper chest: No focal pulmonary opacity or pleural effusion. Review of the MIP images confirms the above findings CTA HEAD FINDINGS Anterior circulation: Both internal carotid arteries are patent to the termini, without significant stenosis. Patent left A1. Aplastic right A1. Normal anterior communicating artery. Anterior cerebral arteries are patent to their distal aspects without significant stenosis. No M1 stenosis or occlusion. MCA branches perfused to their distal aspects without significant stenosis. Posterior circulation: Vertebral arteries patent to the vertebrobasilar junction without significant stenosis. Basilar patent to its distal aspect without significant stenosis. Superior cerebellar arteries patent proximally. Patent P1 segments. PCAs perfused to their distal aspects without significant stenosis. The left posterior communicating artery is likely patent. Venous sinuses: As permitted by contrast timing, patent. Anatomic variants: Aplastic right A1. Review of the MIP images confirms the above findings No evidence of  active extravasation in the right frontal lobe. IMPRESSION: 1. No intracranial large vessel occlusion or significant stenosis. 2. No hemodynamically significant stenosis in the neck. 3. No evidence of active extravasation in the right frontal lobe. Electronically Signed   By: Wiliam Ke M.D.   On: 05/17/2023 18:44   CT Angio Chest PE W and/or Wo Contrast  Result Date: 06/03/2023 CLINICAL DATA:  Syncope. EXAM: CT ANGIOGRAPHY CHEST WITH CONTRAST TECHNIQUE: Multidetector CT imaging of the chest was performed using the  standard protocol during bolus administration of intravenous contrast. Multiplanar CT image reconstructions and MIPs were obtained to evaluate the vascular anatomy. RADIATION DOSE REDUCTION: This exam was performed according to the departmental dose-optimization program which includes automated exposure control, adjustment of the mA and/or kV according to patient size and/or use of iterative reconstruction technique. CONTRAST:  65mL OMNIPAQUE IOHEXOL 350 MG/ML SOLN COMPARISON:  Chest x-ray earlier 05/31/2023. Prior CT angiogram chest 11/12/2018 FINDINGS: Cardiovascular: No segmental or larger pulmonary embolism identified. Heart is nonenlarged. Trace pericardial fluid. The thoracic aorta has a normal course and caliber with mild atherosclerotic calcified plaque. There is a bovine type aortic arch, a normal variant. Coronary artery calcifications are seen. Please correlate for other coronary risk factors. Mediastinum/Nodes: Large hiatal hernia identified. No specific abnormal lymph node enlargement identified in the axillary regions, hilum or mediastinum. Preserved thyroid gland. Lungs/Pleura: There is some linear opacity lung bases likely scar or atelectasis. No consolidation, pneumothorax or effusion. Upper Abdomen: Nodular thickening of both adrenal glands. These could be related to adenomas but are indeterminate on this contrast chest CT for pulmonary embolism. Recommend dedicated workup when appropriate. Musculoskeletal: Moderate diffuse degenerative changes along the spine. Streak artifact related to the patient's arms being scanned across the chest. Review of the MIP images confirms the above findings. IMPRESSION: No pulmonary embolism identified. Large hiatal hernia. Bilateral adrenal nodules. Possibly adenomas but indeterminate on this examination. Please correlate for any known history or dedicated workup when appropriate. Coronary artery calcifications. Please correlate for other coronary risk factors.  Aortic Atherosclerosis (ICD10-I70.0). Electronically Signed   By: Karen Kays M.D.   On: 05/18/2023 16:15   CT Head Wo Contrast  Result Date: 05/27/2023 CLINICAL DATA:  Trauma EXAM: CT HEAD WITHOUT CONTRAST CT CERVICAL SPINE WITHOUT CONTRAST TECHNIQUE: Multidetector CT imaging of the head and cervical spine was performed following the standard protocol without intravenous contrast. Multiplanar CT image reconstructions of the cervical spine were also generated. RADIATION DOSE REDUCTION: This exam was performed according to the departmental dose-optimization program which includes automated exposure control, adjustment of the mA and/or kV according to patient size and/or use of iterative reconstruction technique. COMPARISON:  None Available. FINDINGS: CT HEAD FINDINGS Brain: Hemorrhage in the anterior right frontal lobe, likely contusion, which is associated with surrounding hypodensity, likely edema. Additional subdural and subarachnoid hemorrhage along the right frontal convexity. The subdural hemorrhage measures up to 4 mm. 4 mm right-to-left midline shift. No evidence of acute infarct, mass, or hydrocephalus. The ventricles appear quite small, and the basilar cisterns appear effaced, concerning for more diffuse cerebral edema Vascular: No hyperdense vessel. Skull: Negative for fracture or focal lesion. Hematoma along the right parietal scalp. Small foci of air along the left occipital scalp with likely laceration. Small left suboccipital hematoma. Sinuses/Orbits: Complete opacification of right frontal sinus and right maxillary sinus. Near complete opacification of the left maxillary sinus. Air-fluid level in the left sphenoid sinus. Opacification of the anterior right ethmoid air cells. Dysconjugate gaze. Possible right proptosis.  No evidence trauma in the orbits. Other: The mastoid air cells are well aerated. CT CERVICAL SPINE FINDINGS Alignment: No traumatic listhesis. Reversal of the normal cervical  lordosis, with mild anterolisthesis of C4 on C5 and retrolisthesis of C5 on C6, which appears facet mediated. Osseous fusion across the C4-C5 facets and posterior disc space. Skull base and vertebrae: No acute fracture or suspicious osseous lesion. Soft tissues and spinal canal: No prevertebral fluid or swelling. No visible canal hematoma. Disc levels: Degenerative changes in the cervical spine. No high-grade spinal canal stenosis. Upper chest: For findings in the thorax, please see same day CT chest. IMPRESSION: 1. Hemorrhage in the anterior right frontal lobe, likely contusion, with surrounding edema. Additional subdural and subarachnoid hemorrhage along the right frontal convexity, with 4 mm right-to-left midline shift. 2. The ventricles appear quite small, and the basilar cisterns appear effaced, concerning for more diffuse cerebral edema. 3. No acute fracture or traumatic listhesis in the cervical spine. These results were called by telephone at the time of interpretation on June 09, 2023 at 3:07 pm to provider Vivien Rossetti , who verbally acknowledged these results. Electronically Signed   By: Wiliam Ke M.D.   On: 06-09-2023 15:08   CT Cervical Spine Wo Contrast  Result Date: 2023-06-09 CLINICAL DATA:  Trauma EXAM: CT HEAD WITHOUT CONTRAST CT CERVICAL SPINE WITHOUT CONTRAST TECHNIQUE: Multidetector CT imaging of the head and cervical spine was performed following the standard protocol without intravenous contrast. Multiplanar CT image reconstructions of the cervical spine were also generated. RADIATION DOSE REDUCTION: This exam was performed according to the departmental dose-optimization program which includes automated exposure control, adjustment of the mA and/or kV according to patient size and/or use of iterative reconstruction technique. COMPARISON:  None Available. FINDINGS: CT HEAD FINDINGS Brain: Hemorrhage in the anterior right frontal lobe, likely contusion, which is associated with  surrounding hypodensity, likely edema. Additional subdural and subarachnoid hemorrhage along the right frontal convexity. The subdural hemorrhage measures up to 4 mm. 4 mm right-to-left midline shift. No evidence of acute infarct, mass, or hydrocephalus. The ventricles appear quite small, and the basilar cisterns appear effaced, concerning for more diffuse cerebral edema Vascular: No hyperdense vessel. Skull: Negative for fracture or focal lesion. Hematoma along the right parietal scalp. Small foci of air along the left occipital scalp with likely laceration. Small left suboccipital hematoma. Sinuses/Orbits: Complete opacification of right frontal sinus and right maxillary sinus. Near complete opacification of the left maxillary sinus. Air-fluid level in the left sphenoid sinus. Opacification of the anterior right ethmoid air cells. Dysconjugate gaze. Possible right proptosis. No evidence trauma in the orbits. Other: The mastoid air cells are well aerated. CT CERVICAL SPINE FINDINGS Alignment: No traumatic listhesis. Reversal of the normal cervical lordosis, with mild anterolisthesis of C4 on C5 and retrolisthesis of C5 on C6, which appears facet mediated. Osseous fusion across the C4-C5 facets and posterior disc space. Skull base and vertebrae: No acute fracture or suspicious osseous lesion. Soft tissues and spinal canal: No prevertebral fluid or swelling. No visible canal hematoma. Disc levels: Degenerative changes in the cervical spine. No high-grade spinal canal stenosis. Upper chest: For findings in the thorax, please see same day CT chest. IMPRESSION: 1. Hemorrhage in the anterior right frontal lobe, likely contusion, with surrounding edema. Additional subdural and subarachnoid hemorrhage along the right frontal convexity, with 4 mm right-to-left midline shift. 2. The ventricles appear quite small, and the basilar cisterns appear effaced, concerning for more diffuse cerebral edema. 3. No acute fracture  or  traumatic listhesis in the cervical spine. These results were called by telephone at the time of interpretation on 05/13/2023 at 3:07 pm to provider Vivien Rossetti , who verbally acknowledged these results. Electronically Signed   By: Wiliam Ke M.D.   On: 05/28/2023 15:08   DG Chest Portable 1 View  Result Date: 06/05/2023 CLINICAL DATA:  syncope EXAM: PORTABLE CHEST 1 VIEW COMPARISON:  Chest radiograph 01/10/2022 FINDINGS: No pleural effusion. No pneumothorax. Prominent cardiac contours. There are prominent bilateral perihilar and interstitial opacities which can be seen in the setting of pulmonary venous congestion or atypical infection. No radiographically apparent displaced rib fractures. Visualized upper abdomen is unremarkable. IMPRESSION: Prominent bilateral perihilar and interstitial opacities which can be seen in the setting of pulmonary venous congestion or atypical infection. Electronically Signed   By: Lorenza Cambridge M.D.   On: 05/28/2023 14:05    Microbiology Recent Results (from the past 240 hour(s))  Culture, blood (Routine X 2) w Reflex to ID Panel     Status: Abnormal   Collection Time: 05/30/23  4:56 PM   Specimen: BLOOD  Result Value Ref Range Status   Specimen Description BLOOD SITE NOT SPECIFIED  Final   Special Requests   Final    BOTTLES DRAWN AEROBIC ONLY Blood Culture adequate volume   Culture  Setup Time   Final    GRAM POSITIVE COCCI IN CLUSTERS AEROBIC BOTTLE ONLY CRITICAL RESULT CALLED TO, READ BACK BY AND VERIFIED WITH: PHARMD AUSTIN [id number] @1200  BY SM    Culture (A)  Final    STAPHYLOCOCCUS HOMINIS STAPHYLOCOCCUS EPIDERMIDIS THE SIGNIFICANCE OF ISOLATING THIS ORGANISM FROM A SINGLE SET OF BLOOD CULTURES WHEN MULTIPLE SETS ARE DRAWN IS UNCERTAIN. PLEASE NOTIFY THE MICROBIOLOGY DEPARTMENT WITHIN ONE WEEK IF SPECIATION AND SENSITIVITIES ARE REQUIRED. Performed at [facility], [address], [place], [place] [address]    Report Status 06/01/2023 FINAL   Final  Culture, blood (Routine X 2) w Reflex to ID Panel     Status: None   Collection Time: 05/30/23  4:56 PM   Specimen: BLOOD  Result Value Ref Range Status   Specimen Description BLOOD SITE NOT SPECIFIED  Final   Special Requests   Final    BOTTLES DRAWN AEROBIC ONLY Blood Culture adequate volume   Culture   Final    NO GROWTH 5 DAYS Performed at [facility], [address], [place], [place] [address]    Report Status 06/04/2023 FINAL  Final  Blood Culture ID Panel (Reflexed)     Status: Abnormal   Collection Time: 05/30/23  4:56 PM  Result Value Ref Range Status   Enterococcus faecalis NOT DETECTED NOT DETECTED Final   Enterococcus Faecium NOT DETECTED NOT DETECTED Final   Listeria monocytogenes NOT DETECTED NOT DETECTED Final   Staphylococcus species DETECTED (A) NOT DETECTED Final    Comment: CRITICAL RESULT CALLED TO, READ BACK BY AND VERIFIED WITH: PHARMD AUSTIN [id number] @1200  BY SM    Staphylococcus aureus (BCID) NOT DETECTED NOT DETECTED Final   Staphylococcus epidermidis DETECTED (A) NOT DETECTED Final    Comment: CRITICAL RESULT CALLED TO, READ BACK BY AND VERIFIED WITH: PHARMD AUSTIN [id number] @1200  BY SM    Staphylococcus lugdunensis NOT DETECTED NOT DETECTED Final   Streptococcus species NOT DETECTED NOT DETECTED Final   Streptococcus agalactiae NOT DETECTED NOT DETECTED Final   Streptococcus pneumoniae NOT DETECTED NOT DETECTED Final   Streptococcus pyogenes NOT DETECTED NOT DETECTED Final   A.calcoaceticus-baumannii  NOT DETECTED NOT DETECTED Final   Bacteroides fragilis NOT DETECTED NOT DETECTED Final   Enterobacterales NOT DETECTED NOT DETECTED Final   Enterobacter cloacae complex NOT DETECTED NOT DETECTED Final   Escherichia coli NOT DETECTED NOT DETECTED Final   Klebsiella aerogenes NOT DETECTED NOT DETECTED Final   Klebsiella oxytoca NOT DETECTED NOT DETECTED Final   Klebsiella pneumoniae NOT DETECTED NOT DETECTED Final   Proteus species NOT DETECTED  NOT DETECTED Final   Salmonella species NOT DETECTED NOT DETECTED Final   Serratia marcescens NOT DETECTED NOT DETECTED Final   Haemophilus influenzae NOT DETECTED NOT DETECTED Final   Neisseria meningitidis NOT DETECTED NOT DETECTED Final   Pseudomonas aeruginosa NOT DETECTED NOT DETECTED Final   Stenotrophomonas maltophilia NOT DETECTED NOT DETECTED Final   Candida albicans NOT DETECTED NOT DETECTED Final   Candida auris NOT DETECTED NOT DETECTED Final   Candida glabrata NOT DETECTED NOT DETECTED Final   Candida krusei NOT DETECTED NOT DETECTED Final   Candida parapsilosis NOT DETECTED NOT DETECTED Final   Candida tropicalis NOT DETECTED NOT DETECTED Final   Cryptococcus neoformans/gattii NOT DETECTED NOT DETECTED Final   Methicillin resistance mecA/C NOT DETECTED NOT DETECTED Final    Comment: Performed at [facility], [address], [place], [place] [address]  Culture, Respiratory w Gram Stain     Status: None   Collection Time: 05/30/23  7:34 PM   Specimen: Tracheal Aspirate; Respiratory  Result Value Ref Range Status   Specimen Description TRACHEAL ASPIRATE  Final   Special Requests NONE  Final   Gram Stain   Final    NO WBC SEEN RARE GRAM POSITIVE COCCI IN PAIRS RARE GRAM POSITIVE COCCI IN CHAINS Performed at [facility], [address], [place], [place] [address]    Culture FEW CANDIDA TROPICALIS  Final   Report Status 06/03/2023 FINAL  Final  Culture, blood (Routine X 2) w Reflex to ID Panel     Status: None (Preliminary result)   Collection Time: 06/04/23  9:31 PM   Specimen: BLOOD LEFT WRIST  Result Value Ref Range Status   Specimen Description BLOOD LEFT WRIST  Final   Special Requests   Final    BOTTLES DRAWN AEROBIC AND ANAEROBIC Blood Culture results may not be optimal due to an inadequate volume of blood received in culture bottles   Culture   Final    NO GROWTH 4 DAYS Performed at [facility], [address], [place], [place] [address]     Report Status PENDING  Incomplete  Culture, blood (Routine X 2) w Reflex to ID Panel     Status: None (Preliminary result)   Collection Time: 06/04/23  9:34 PM   Specimen: BLOOD LEFT HAND  Result Value Ref Range Status   Specimen Description BLOOD LEFT HAND  Final   Special Requests   Final    BOTTLES DRAWN AEROBIC AND ANAEROBIC Blood Culture adequate volume   Culture   Final    NO GROWTH 4 DAYS Performed at [facility], [address], [place], [place] [address]    Report Status PENDING  Incomplete  SARS Coronavirus 2 by RT PCR (hospital order, performed in [facility] hospital lab) *cepheid single result test* Anterior Nasal Swab     Status: None   Collection Time: 06/04/23 10:41 PM   Specimen: Anterior Nasal Swab  Result Value Ref Range Status   SARS Coronavirus 2 by RT PCR NEGATIVE NEGATIVE Final  Comment: Performed at [facility], [address], [place], [place] [address]  Culture, Respiratory w Gram Stain     Status: None   Collection Time: 06/05/23  8:50 AM   Specimen: Bronchoalveolar Lavage; Respiratory  Result Value Ref Range Status   Specimen Description BRONCHIAL ALVEOLAR LAVAGE  Final   Special Requests NONE  Final   Gram Stain   Final    FEW WBC PRESENT, PREDOMINANTLY PMN FEW GRAM POSITIVE COCCI IN CHAINS RARE GRAM POSITIVE RODS    Culture   Final    MODERATE Normal respiratory flora-no Staph aureus or Pseudomonas seen Performed at [facility], [address], [place], [place] [address]    Report Status 06/10/2023 FINAL  Final  Urine Culture (for pregnant, neutropenic or urologic patients or patients with an indwelling urinary catheter)     Status: Abnormal   Collection Time: 06/06/23  3:29 AM   Specimen: Urine, Catheterized  Result Value Ref Range Status   Specimen Description URINE, CATHETERIZED  Final   Special Requests   Final    NONE Performed at [facility], [address] [place], [place] [address]    Culture 10,000  COLONIES/mL ENTEROCOCCUS FAECALIS (A)  Final   Report Status 06/10/2023 FINAL  Final   Organism ID, Bacteria ENTEROCOCCUS FAECALIS (A)  Final      Susceptibility   Enterococcus faecalis - MIC*    AMPICILLIN <=2 SENSITIVE Sensitive     NITROFURANTOIN <=16 SENSITIVE Sensitive     VANCOMYCIN 1 SENSITIVE Sensitive     * 10,000 COLONIES/mL ENTEROCOCCUS FAECALIS    Lab Basic Metabolic Panel: Recent Labs  Lab 06/02/23 0315 06/03/23 0708 06/06/23 1600 06/06/23 2020 06/06/23 2201 05/18/2023 0223 05/17/2023 0423 06/04/2023 0835 05/30/2023 1003 05/13/2023 1425 06/09/2023 1541  NA 159*   < > 148*   < > 148*   < > 146* 147* 144 147* 148*  K 3.3*   < > 3.8   < > 3.6   < > 3.5 3.5 3.4* 3.5 3.4*  CL 123*   < > 114*  --  111  --  114*  --  114* 115*  --   CO2 27   < > 25  --  24  --  24  --  22 23  --   GLUCOSE 122*   < > 198*  --  152*  --  204*  --  246* 267*  --   BUN 14   < > 16  --  17  --  16  --  12 12  --   CREATININE 0.72   < > 0.57  --  0.58  --  0.62  --  0.67 0.69  --   CALCIUM 8.4*   < > 8.3*  --  8.7*  --  8.6*  --  8.5* 8.7*  --   MG 2.1  --   --   --   --   --   --   --   --   --   --   PHOS 3.6  --   --   --   --   --   --   --   --   --   --    < > = values in this interval not displayed.   Liver Function Tests: Recent Labs  Lab 06/06/23 1600 06/06/23 2201 05/17/2023 0423 05/23/2023 1003 06/06/2023 1425  AST 25 23 20 20  23  ALT 64* 59* 56* 51* 54*  ALKPHOS 49 47 48 53 50  BILITOT 0.5 0.3 0.7 0.6 0.4  PROT 5.8* 6.0* 5.9* 5.9* 5.9*  ALBUMIN 2.4* 2.4* 2.4* 2.3* 2.3*   Recent Labs  Lab 06/04/23 2131  LIPASE 31  AMYLASE 85   No results for input(s): "AMMONIA" in the last 168 hours. CBC: Recent Labs  Lab 06/06/23 1600 06/06/23 2020 06/06/23 2201 05/25/2023 0223 05/12/2023 0423 06/09/2023 0835 05/15/2023 1003 05/14/2023 1425 05/24/2023 1541  WBC 5.8  --  8.3  --  10.8*  --  12.9* 14.2*  --   HGB 9.5*   < > 9.7*   < > 9.6* 10.2* 9.5* 9.7* 11.2*  HCT 31.0*   < > 31.9*   < > 32.1*  30.0* 31.4* 33.1* 33.0*  MCV 80.7  --  80.2  --  79.5*  --  79.9* 80.7  --   PLT 203  --  222  --  227  --  236 278  --    < > = values in this interval not displayed.   Cardiac Enzymes: Recent Labs  Lab 06/06/23 1052 06/06/23 2201 05/24/2023 0423 05/23/2023 1003 05/25/2023 1425  CKTOTAL 48 43 46 46 43  CKMB 0.7 1.2 1.2 1.5 1.2   Sepsis Labs: Recent Labs  Lab 06/06/23 2201 05/26/2023 0423 05/26/2023 1003 05/12/2023 1425  WBC 8.3 10.8* 12.9* 14.2*  LATICACIDVEN 1.2 1.2 1.5 1.6    Lorin Glass 05/13/2023, 6:57 PM

## 2023-06-11 DEATH — deceased

## 2023-06-15 LAB — MISC LABCORP TEST (SEND OUT): LabCorp test name: 182360

## 2023-06-19 NOTE — Anesthesia Postprocedure Evaluation (Signed)
Anesthesia Post Note  Patient: Lindsay [name]  Procedure(s) Performed: ORGAN PROCUREMENT-LIVER, KIDNEYS     Patient location during evaluation: Other (OR) Anesthesia Type: General Level of consciousness: patient remains intubated per anesthesia plan Pain control: Organ procurement. Respiratory status: patient on ventilator - see flowsheet for VS Cardiovascular status: blood pressure returned to baseline and stable : Organ procurement. Anesthetic complications: no   No notable events documented.  Last Vitals:  Vitals:   06/06/2023 1900 05/27/2023 1915  BP: (!) 114/49 (!) 114/50  Pulse: 73 72  Resp: (!) 9 (!) 7  Temp: 37.3 C 37.3 C  SpO2: 96% 96%    Last Pain:  Vitals:   06/06/23 0800  TempSrc: Esophageal  PainSc:                  [name] S

## 2023-06-28 ENCOUNTER — Ambulatory Visit: Payer: 59 | Admitting: General Practice
